# Patient Record
Sex: Female | Born: 1935
Health system: Southern US, Community
[De-identification: ages and names within clinical notes are randomized; demographics above are authoritative.]

## PROBLEM LIST (undated history)

## (undated) DIAGNOSIS — K648 Other hemorrhoids: Secondary | ICD-10-CM

## (undated) DIAGNOSIS — K5730 Diverticulosis of large intestine without perforation or abscess without bleeding: Secondary | ICD-10-CM

## (undated) DIAGNOSIS — M810 Age-related osteoporosis without current pathological fracture: Secondary | ICD-10-CM

## (undated) DIAGNOSIS — G4730 Sleep apnea, unspecified: Secondary | ICD-10-CM

## (undated) DIAGNOSIS — I341 Nonrheumatic mitral (valve) prolapse: Secondary | ICD-10-CM

## (undated) DIAGNOSIS — N904 Leukoplakia of vulva: Secondary | ICD-10-CM

## (undated) DIAGNOSIS — G8929 Other chronic pain: Secondary | ICD-10-CM

## (undated) DIAGNOSIS — I10 Essential (primary) hypertension: Secondary | ICD-10-CM

## (undated) DIAGNOSIS — M1990 Unspecified osteoarthritis, unspecified site: Secondary | ICD-10-CM

## (undated) DIAGNOSIS — H269 Unspecified cataract: Secondary | ICD-10-CM

## (undated) DIAGNOSIS — R531 Weakness: Secondary | ICD-10-CM

## (undated) DIAGNOSIS — K219 Gastro-esophageal reflux disease without esophagitis: Secondary | ICD-10-CM

## (undated) DIAGNOSIS — D128 Benign neoplasm of rectum: Secondary | ICD-10-CM

## (undated) DIAGNOSIS — G459 Transient cerebral ischemic attack, unspecified: Secondary | ICD-10-CM

## (undated) DIAGNOSIS — E785 Hyperlipidemia, unspecified: Secondary | ICD-10-CM

## (undated) DIAGNOSIS — M21372 Foot drop, left foot: Secondary | ICD-10-CM

## (undated) DIAGNOSIS — E039 Hypothyroidism, unspecified: Secondary | ICD-10-CM

## (undated) DIAGNOSIS — I4891 Unspecified atrial fibrillation: Secondary | ICD-10-CM

## (undated) DIAGNOSIS — K559 Vascular disorder of intestine, unspecified: Secondary | ICD-10-CM

## (undated) DIAGNOSIS — M549 Dorsalgia, unspecified: Secondary | ICD-10-CM

## (undated) DIAGNOSIS — R748 Abnormal levels of other serum enzymes: Secondary | ICD-10-CM

## (undated) DIAGNOSIS — F329 Major depressive disorder, single episode, unspecified: Secondary | ICD-10-CM

## (undated) DIAGNOSIS — F32A Depression, unspecified: Secondary | ICD-10-CM

## (undated) DIAGNOSIS — K449 Diaphragmatic hernia without obstruction or gangrene: Secondary | ICD-10-CM

## (undated) DIAGNOSIS — K3184 Gastroparesis: Secondary | ICD-10-CM

## (undated) DIAGNOSIS — R269 Unspecified abnormalities of gait and mobility: Secondary | ICD-10-CM

## (undated) HISTORY — DX: Unspecified cataract: H26.9

## (undated) HISTORY — DX: Diverticulosis of large intestine without perforation or abscess without bleeding: K57.30

## (undated) HISTORY — DX: Essential (primary) hypertension: I10

## (undated) HISTORY — DX: Weakness: R53.1

## (undated) HISTORY — DX: Dorsalgia, unspecified: M54.9

## (undated) HISTORY — DX: Benign neoplasm of rectum: D12.8

## (undated) HISTORY — PX: CATARACT EXTRACTION: SUR2

## (undated) HISTORY — DX: Foot drop, left foot: M21.372

## (undated) HISTORY — DX: Leukoplakia of vulva: N90.4

## (undated) HISTORY — DX: Transient cerebral ischemic attack, unspecified: G45.9

## (undated) HISTORY — DX: Age-related osteoporosis without current pathological fracture: M81.0

## (undated) HISTORY — DX: Other hemorrhoids: K64.8

## (undated) HISTORY — DX: Depression, unspecified: F32.A

## (undated) HISTORY — PX: COLONOSCOPY: SHX174

## (undated) HISTORY — PX: TUBAL LIGATION: SHX77

## (undated) HISTORY — DX: Unspecified abnormalities of gait and mobility: R26.9

## (undated) HISTORY — DX: Vascular disorder of intestine, unspecified: K55.9

## (undated) HISTORY — DX: Major depressive disorder, single episode, unspecified: F32.9

## (undated) HISTORY — DX: Sleep apnea, unspecified: G47.30

## (undated) HISTORY — DX: Other chronic pain: G89.29

## (undated) HISTORY — PX: CHOLECYSTECTOMY: SHX55

## (undated) HISTORY — DX: Diaphragmatic hernia without obstruction or gangrene: K44.9

## (undated) HISTORY — DX: Unspecified osteoarthritis, unspecified site: M19.90

---
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
## 1999-03-06 ENCOUNTER — Ambulatory Visit (HOSPITAL_COMMUNITY): Admission: RE | Admit: 1999-03-06 | Discharge: 1999-03-06 | Payer: Self-pay | Admitting: Gastroenterology

## 1999-09-27 DIAGNOSIS — K449 Diaphragmatic hernia without obstruction or gangrene: Secondary | ICD-10-CM

## 1999-09-27 HISTORY — DX: Diaphragmatic hernia without obstruction or gangrene: K44.9

## 2001-01-18 ENCOUNTER — Other Ambulatory Visit: Admission: RE | Admit: 2001-01-18 | Discharge: 2001-01-18 | Payer: Self-pay | Admitting: Urology

## 2002-05-29 ENCOUNTER — Encounter: Payer: Self-pay | Admitting: Emergency Medicine

## 2002-05-29 ENCOUNTER — Emergency Department (HOSPITAL_COMMUNITY): Admission: EM | Admit: 2002-05-29 | Discharge: 2002-05-30 | Payer: Self-pay | Admitting: Emergency Medicine

## 2002-06-25 ENCOUNTER — Ambulatory Visit (HOSPITAL_COMMUNITY): Admission: RE | Admit: 2002-06-25 | Discharge: 2002-06-25 | Payer: Self-pay | Admitting: Cardiology

## 2002-06-25 ENCOUNTER — Encounter: Payer: Self-pay | Admitting: Cardiology

## 2003-01-03 ENCOUNTER — Encounter: Payer: Self-pay | Admitting: Emergency Medicine

## 2003-01-03 ENCOUNTER — Emergency Department (HOSPITAL_COMMUNITY): Admission: EM | Admit: 2003-01-03 | Discharge: 2003-01-03 | Payer: Self-pay | Admitting: Emergency Medicine

## 2003-10-07 ENCOUNTER — Emergency Department (HOSPITAL_COMMUNITY): Admission: EM | Admit: 2003-10-07 | Discharge: 2003-10-07 | Payer: Self-pay | Admitting: Emergency Medicine

## 2004-01-19 ENCOUNTER — Emergency Department (HOSPITAL_COMMUNITY): Admission: EM | Admit: 2004-01-19 | Discharge: 2004-01-19 | Payer: Self-pay | Admitting: Emergency Medicine

## 2004-03-17 ENCOUNTER — Inpatient Hospital Stay (HOSPITAL_COMMUNITY): Admission: EM | Admit: 2004-03-17 | Discharge: 2004-03-19 | Payer: Self-pay | Admitting: Emergency Medicine

## 2004-04-28 ENCOUNTER — Ambulatory Visit (HOSPITAL_COMMUNITY): Admission: RE | Admit: 2004-04-28 | Discharge: 2004-04-28 | Payer: Self-pay | Admitting: Gastroenterology

## 2004-05-05 ENCOUNTER — Emergency Department (HOSPITAL_COMMUNITY): Admission: EM | Admit: 2004-05-05 | Discharge: 2004-05-05 | Payer: Self-pay | Admitting: Emergency Medicine

## 2004-05-06 ENCOUNTER — Inpatient Hospital Stay (HOSPITAL_COMMUNITY): Admission: EM | Admit: 2004-05-06 | Discharge: 2004-05-09 | Payer: Self-pay | Admitting: Emergency Medicine

## 2004-08-13 ENCOUNTER — Emergency Department (HOSPITAL_COMMUNITY): Admission: EM | Admit: 2004-08-13 | Discharge: 2004-08-14 | Payer: Self-pay | Admitting: Emergency Medicine

## 2005-02-03 ENCOUNTER — Ambulatory Visit: Payer: Self-pay | Admitting: Gastroenterology

## 2005-02-08 ENCOUNTER — Ambulatory Visit (HOSPITAL_COMMUNITY): Admission: RE | Admit: 2005-02-08 | Discharge: 2005-02-08 | Payer: Self-pay | Admitting: Gastroenterology

## 2005-08-12 ENCOUNTER — Emergency Department (HOSPITAL_COMMUNITY): Admission: EM | Admit: 2005-08-12 | Discharge: 2005-08-12 | Payer: Self-pay | Admitting: *Deleted

## 2005-08-18 ENCOUNTER — Ambulatory Visit (HOSPITAL_COMMUNITY): Admission: RE | Admit: 2005-08-18 | Discharge: 2005-08-18 | Payer: Self-pay | Admitting: Family Medicine

## 2005-09-07 ENCOUNTER — Ambulatory Visit (HOSPITAL_COMMUNITY): Admission: RE | Admit: 2005-09-07 | Discharge: 2005-09-07 | Payer: Self-pay | Admitting: Family Medicine

## 2005-11-29 ENCOUNTER — Ambulatory Visit: Payer: Self-pay | Admitting: Gastroenterology

## 2006-11-16 ENCOUNTER — Ambulatory Visit: Payer: Self-pay | Admitting: Gastroenterology

## 2007-03-06 ENCOUNTER — Ambulatory Visit: Payer: Self-pay | Admitting: Gastroenterology

## 2007-03-16 ENCOUNTER — Ambulatory Visit: Payer: Self-pay | Admitting: Gastroenterology

## 2007-03-16 ENCOUNTER — Encounter (INDEPENDENT_AMBULATORY_CARE_PROVIDER_SITE_OTHER): Payer: Self-pay | Admitting: *Deleted

## 2008-08-10 ENCOUNTER — Encounter: Admission: RE | Admit: 2008-08-10 | Discharge: 2008-08-10 | Payer: Self-pay | Admitting: Neurosurgery

## 2009-01-10 ENCOUNTER — Encounter (HOSPITAL_COMMUNITY): Admission: RE | Admit: 2009-01-10 | Discharge: 2009-01-10 | Payer: Self-pay | Admitting: Pediatrics

## 2009-01-23 ENCOUNTER — Telehealth: Payer: Self-pay | Admitting: Internal Medicine

## 2009-02-07 ENCOUNTER — Telehealth: Payer: Self-pay | Admitting: Internal Medicine

## 2009-02-10 ENCOUNTER — Encounter (INDEPENDENT_AMBULATORY_CARE_PROVIDER_SITE_OTHER): Payer: Self-pay | Admitting: *Deleted

## 2009-09-10 ENCOUNTER — Emergency Department (HOSPITAL_COMMUNITY): Admission: EM | Admit: 2009-09-10 | Discharge: 2009-09-10 | Payer: Self-pay | Admitting: Emergency Medicine

## 2010-01-28 ENCOUNTER — Encounter: Admission: RE | Admit: 2010-01-28 | Discharge: 2010-01-28 | Payer: Self-pay | Admitting: Neurosurgery

## 2010-05-06 ENCOUNTER — Telehealth (INDEPENDENT_AMBULATORY_CARE_PROVIDER_SITE_OTHER): Payer: Self-pay | Admitting: *Deleted

## 2010-06-18 ENCOUNTER — Encounter: Payer: Self-pay | Admitting: Cardiology

## 2011-01-17 ENCOUNTER — Encounter: Payer: Self-pay | Admitting: Gastroenterology

## 2011-01-26 NOTE — Progress Notes (Signed)
Summary: Schedule recall colonoscopy    Phone Note Outgoing Call Call back at [facility] Phone [phone]   Call placed by: Christie Nottingham CMA Duncan Dull),  May 06, 2010 2:58 PM Call placed to: Patient Summary of Call: Called pt to schedule recall colonoscopy and pt states she will have to discuss transportation with her daughter and she will call us back to schedule.  Initial call taken by: Christie Nottingham CMA (AAMA),  May 06, 2010 3:00 PM

## 2011-01-29 NOTE — Miscellaneous (Signed)
Summary: Advanced Written Confirmation of Verbal Orders  Advanced Written Confirmation of Verbal Orders   Imported By: Roderic Ovens 07/01/2010 14:21:42  _____________________________________________________________________  External Attachment:    Type:   Image     Comment:   External Document

## 2011-02-01 ENCOUNTER — Encounter (INDEPENDENT_AMBULATORY_CARE_PROVIDER_SITE_OTHER): Payer: Self-pay | Admitting: *Deleted

## 2011-02-11 NOTE — Letter (Signed)
Summary: Pre Visit Letter Revised  [facility]  [address] [place], [place] [address]   Phone: [phone]  Fax: [phone]        02/01/2011 MRN: [id number] Erica [name] [address] [name], [place]  [address]             Procedure Date:  03/02/2011 @ 11:30   Recall colon-Dr. Leone Payor   Welcome to the Gastroenterology Division at [facility].    You are scheduled to see a nurse for your pre-procedure visit on 02/17/2011 at 10:00 on the 3rd floor at [facility], [address]  We ask that you try to arrive at our office 15 minutes prior to your appointment time to allow for check-in.  Please take a minute to review the attached form.  If you answer "Yes" to one or more of the questions on the first page, we ask that you call the person listed at your earliest opportunity.  If you answer "No" to all of the questions, please complete the rest of the form and bring it to your appointment.    Your nurse visit will consist of discussing your medical and surgical history, your immediate family medical history, and your medications.   If you are unable to list all of your medications on the form, please bring the medication bottles to your appointment and we will list them.  We will need to be aware of both prescribed and over the counter drugs.  We will need to know exact dosage information as well.    Please be prepared to read and sign documents such as consent forms, a financial agreement, and acknowledgement forms.  If necessary, and with your consent, a friend or relative is welcome to sit-in on the nurse visit with you.  Please bring your insurance card so that we may make a copy of it.  If your insurance requires a referral to see a specialist, please bring your referral form from your primary care physician.  No co-pay is required for this nurse visit.     If you cannot keep your appointment, please call [phone] to cancel or reschedule prior to your  appointment date.  This allows [place] the opportunity to schedule an appointment for another patient in need of care.    Thank you for choosing [facility] for your medical needs.  We appreciate the opportunity to care for you.  Please visit [place] at our website  to learn more about our practice.  Sincerely, The Gastroenterology Division

## 2011-02-16 ENCOUNTER — Encounter (INDEPENDENT_AMBULATORY_CARE_PROVIDER_SITE_OTHER): Payer: Self-pay | Admitting: *Deleted

## 2011-02-17 ENCOUNTER — Encounter: Payer: Self-pay | Admitting: Internal Medicine

## 2011-02-23 NOTE — Miscellaneous (Signed)
Summary: [facility] previsit  Clinical Lists Changes  Medications: Added new medication of MOVIPREP 100 GM  SOLR (PEG-KCL-NACL-NASULF-NA ASC-C) As per prep instructions. - Signed Rx of MOVIPREP 100 GM  SOLR (PEG-KCL-NACL-NASULF-NA ASC-C) As per prep instructions.;  #1 x 0;  Signed;  Entered by: Karl Bales RN;  Authorized by: Iva Boop MD, FACG;  Method used: Print then Give to Patient Allergies: Added new allergy or adverse reaction of IODINE Observations: Added new observation of NKA: F (02/17/2011 11:19)    Prescriptions: MOVIPREP 100 GM  SOLR (PEG-KCL-NACL-NASULF-NA ASC-C) As per prep instructions.  #1 x 0   Entered by:   Karl Bales RN   Authorized by:   Iva Boop MD, [facility]   Signed by:   Karl Bales RN on 02/17/2011   Method used:   Print then Give to Patient   RxID:   [id number]

## 2011-02-23 NOTE — Letter (Signed)
Summary: [facility] Instructions  [facility]  [address] [place], [place] [address]   Phone: [phone]  Fax: [phone]       ELLEE WAWRZYNIAK    [birth year]    MRN: [id number]        Procedure Day Dorna Bloom:  Jake Shark  03/02/11     Arrival Time:  10:30AM     Procedure Time:  11:30AM     Location of Procedure:                    _X _  [facility] (4th Floor)                      PREPARATION FOR COLONOSCOPY WITH MOVIPREP   Starting 5 days prior to your procedure 02/25/11 do not eat nuts, seeds, popcorn, corn, beans, peas,  salads, or any raw vegetables.  Do not take any fiber supplements (e.g. Metamucil, Citrucel, and Benefiber).  THE DAY BEFORE YOUR PROCEDURE         DATE: 03/01/11   DAY: MONDAY  1.  Drink clear liquids the entire day-NO SOLID FOOD  2.  Do not drink anything colored red or purple.  Avoid juices with pulp.  No orange juice.  3.  Drink at least 64 oz. (8 glasses) of fluid/clear liquids during the day to prevent dehydration and help the prep work efficiently.  CLEAR LIQUIDS INCLUDE: Water Jello Ice Popsicles Tea (sugar ok, no milk/cream) Powdered fruit flavored drinks Coffee (sugar ok, no milk/cream) Gatorade Juice: apple, white grape, white cranberry  Lemonade Clear bullion, consomm, broth Carbonated beverages (any kind) Strained chicken noodle soup Hard Candy                             4.  In the morning, mix first dose of MoviPrep solution:    Empty 1 Pouch A and 1 Pouch B into the disposable container    Add lukewarm drinking water to the top line of the container. Mix to dissolve    Refrigerate (mixed solution should be used within 24 hrs)  5.  Begin drinking the prep at 5:00 p.m. The MoviPrep container is divided by 4 marks.   Every 15 minutes drink the solution down to the next mark (approximately 8 oz) until the full liter is complete.   6.  Follow completed prep with 16 oz of clear liquid of your choice (Nothing  red or purple).  Continue to drink clear liquids until bedtime.  7.  Before going to bed, mix second dose of MoviPrep solution:    Empty 1 Pouch A and 1 Pouch B into the disposable container    Add lukewarm drinking water to the top line of the container. Mix to dissolve    Refrigerate  THE DAY OF YOUR PROCEDURE      DATE: 03/02/11  DAY: TUESDAY  Beginning at 6:30AM (5 hours before procedure):         1. Every 15 minutes, drink the solution down to the next mark (approx 8 oz) until the full liter is complete.  2. Follow completed prep with 16 oz. of clear liquid of your choice.    3. You may drink clear liquids until 9:30AM (2 HOURS BEFORE PROCEDURE).   MEDICATION INSTRUCTIONS  Unless otherwise instructed, you should take regular prescription medications with a small sip of water   as early as possible the morning of  your procedure.   Additional medication instructions: HOLD FUROSEMIDE the day of your procedure only         OTHER INSTRUCTIONS  You will need a responsible adult at least 75 years of age to accompany you and drive you home.   This person must remain in the waiting room during your procedure.  Wear loose fitting clothing that is easily removed.  Leave jewelry and other valuables at home.  However, you may wish to bring a book to read or  an iPod/MP3 player to listen to music as you wait for your procedure to start.  Remove all body piercing jewelry and leave at home.  Total time from sign-in until discharge is approximately 2-3 hours.  You should go home directly after your procedure and rest.  You can resume normal activities the  day after your procedure.  The day of your procedure you should not:   Drive   Make legal decisions   Operate machinery   Drink alcohol   Return to work  You will receive specific instructions about eating, activities and medications before you leave.    The above instructions have been reviewed and explained to  me by   Karl Bales RN  February 17, 2011 11:52 AM    I fully understand and can verbalize these instructions _____________________________ Date _________

## 2011-03-02 ENCOUNTER — Other Ambulatory Visit: Payer: Self-pay | Admitting: Internal Medicine

## 2011-03-02 ENCOUNTER — Other Ambulatory Visit (AMBULATORY_SURGERY_CENTER): Payer: Medicare Other | Admitting: Internal Medicine

## 2011-03-02 DIAGNOSIS — Z86010 Personal history of colonic polyps: Secondary | ICD-10-CM

## 2011-03-02 DIAGNOSIS — K5730 Diverticulosis of large intestine without perforation or abscess without bleeding: Secondary | ICD-10-CM

## 2011-03-02 DIAGNOSIS — Z1211 Encounter for screening for malignant neoplasm of colon: Secondary | ICD-10-CM

## 2011-03-02 DIAGNOSIS — K620 Anal polyp: Secondary | ICD-10-CM

## 2011-03-02 DIAGNOSIS — D126 Benign neoplasm of colon, unspecified: Secondary | ICD-10-CM

## 2011-03-05 ENCOUNTER — Telehealth: Payer: Self-pay | Admitting: Internal Medicine

## 2011-03-09 ENCOUNTER — Encounter: Payer: Self-pay | Admitting: Internal Medicine

## 2011-03-09 NOTE — Procedures (Addendum)
Summary: Colonoscopy  Patient: Erica [name] Note: All result statuses are Final unless otherwise noted.  Tests: (1) Colonoscopy (COL)   COL Colonoscopy           DONE     [facility]     [address]     [place], [place]  [address]          COLONOSCOPY PROCEDURE REPORT          PATIENT:  [name], [name]  MR#:  [id number]     BIRTHDATE:  1930/01/20, 80 yrs. old  GENDER:  female     ENDOSCOPIST:  Iva Boop, MD, [facility]     REF. BY:     PROCEDURE DATE:  03/02/2011     PROCEDURE:  Colonoscopy with snare polypectomy     ASA CLASS:  Class II     INDICATIONS:  surveillance and high-risk screening, history of     pre-cancerous (adenomatous) colon polyps multiple polyps over the     years, last in 2008 12 mm adenoma from rectosigmoid area. has had     a recurrent villous rectal polyp in past, also     MEDICATIONS:   Fentanyl 50 mcg IV, Versed 7 mg IV          DESCRIPTION OF PROCEDURE:   After the risks benefits and     alternatives of the procedure were thoroughly explained, informed     consent was obtained.  Digital rectal exam was performed and     revealed no abnormalities.   The LB 180AL [id number] endoscope was     introduced through the anus and advanced to the cecum, which was     identified by both the appendix and ileocecal valve, without     limitations.  The quality of the prep was excellent, using     MoviPrep.  The instrument was then slowly withdrawn as the colon     was fully examined. Insertion: 5:02 minutes withdrawal: 13:26     minutes     <<PROCEDUREIMAGES>>          FINDINGS:  A sessile polyp was found in the rectum. 15 mm in     proximal rectum. Polyp was snared piecemeal, then cauterized with     monopolar cautery. Retrieval was successful.   Severe     diverticulosis was found in the sigmoid colon.  This was otherwise     a normal examination of the colon.   Retroflexed views in the     rectum revealed internal hemorrhoids.    The scope was  then     withdrawn from the patient and the procedure completed.          COMPLICATIONS:  None     ENDOSCOPIC IMPRESSION:     1) Sessile polyp in the rectum - removed. ? recurrence of prior     polyp     2) Severe diverticulosis in the sigmoid colon     3) Internal hemorrhoids     4) Otherwise normal examination, excellent prep     5) Personal history of adenomatous polyps including recurrent     rectal polyp in past.     RECOMMENDATIONS:     1) Hold aspirin, aspirin products, and anti-inflammatory     medication for 2 weeks.     REPEAT EXAM:  In for Colonoscopy or Sigmoidoscopy, pending biopsy     results.          Iva Boop, MD,  [facility]          CC:  Bennie Pierini, NP     The Patient          n.     eSIGNED:   Iva Boop at 03/02/2011 12:57 PM          Lavera Guise, [id number]  Note: An exclamation mark (!) indicates a result that was not dispersed into the flowsheet. Document Creation Date: 03/02/2011 12:57 PM _______________________________________________________________________  (1) Order result status: Final Collection or observation date-time: 03/02/2011 12:46 Requested date-time:  Receipt date-time:  Reported date-time:  Referring Physician:   Ordering Physician: Stan Head [phone]) Specimen Source:  Source: Launa Grill Order Number: [id number] Lab site:   Appended Document: Colonoscopy     Procedures Next Due Date:    Colonoscopy: 08/2011

## 2011-03-16 NOTE — Letter (Signed)
Summary: Patient Notice- Polyp Results  [facility]  [address] [place], [place] [address]   Phone: [phone]  Fax: [phone]        March 09, 2011 MRN: [id number]    Erica [name] [address] [place], [place]  [address]    Dear Ms. Flegel,  The polyp removed from your rectum was adenomatous. This means that it was pre-cancerous or that it had the potential to change into cancer over time.  In order to make sure that the polyp was completely removed, I recommend you have a repeat examination of the area with sigmoidoscopy in 6 months (around July or August 2012). If you are not contacted by [place] at that time, please call us.  Should you develop new or worsening symptoms of abdominal pain, bowel habit changes or bleeding from the rectum or bowels, please schedule an evaluation with either your primary care physician or with me.  Please call us if you are having persistent problems or have questions about your condition that have not been fully answered at this time.  Sincerely,  Iva Boop MD, [facility]  This letter has been electronically signed by your physician.  Appended Document: Patient Notice- Polyp Results letter mailed

## 2011-03-16 NOTE — Progress Notes (Signed)
Summary: Triage   Phone Note Call from Patient Call back at Home Phone [phone]   Caller: Patient Call For: Dr. Leone Payor Reason for Call: Talk to Nurse Summary of Call: Constipation since the COL...wants to discuss Initial call taken by: Karna Christmas,  March 05, 2011 9:41 AM  Follow-up for Phone Call        Patient hasn't had a BM since her colon on 03/02/11.  She also saw a very small amount of blood when she wiped. Patient advised that she can try MOM, Miralax or a stool softener.  She is advised to call back for an increase in bleeding or problems.  She denies abdominal pain, nausea or vomiting.   Follow-up by: Darcey Nora RN, CGRN,  March 05, 2011 10:15 AM

## 2011-04-12 LAB — HEPATIC FUNCTION PANEL
ALT: 21 U/L (ref 0–35)
Albumin: 3.3 g/dL — ABNORMAL LOW (ref 3.5–5.2)
Alkaline Phosphatase: 46 U/L (ref 39–117)
Indirect Bilirubin: 0.5 mg/dL (ref 0.3–0.9)
Total Bilirubin: 0.6 mg/dL (ref 0.3–1.2)
Total Protein: 6.3 g/dL (ref 6.0–8.3)

## 2011-04-12 LAB — TSH: TSH: 1.116 u[IU]/mL (ref 0.350–4.500)

## 2011-04-12 LAB — LIPID PANEL: Total CHOL/HDL Ratio: 4.8 RATIO

## 2011-04-12 LAB — BASIC METABOLIC PANEL
BUN: 19 mg/dL (ref 6–23)
Calcium: 9.1 mg/dL (ref 8.4–10.5)

## 2011-05-14 NOTE — Assessment & Plan Note (Signed)
[facility]                           GASTROENTEROLOGY OFFICE NOTE   NAME:Erica [name], Erica [name]                      MRN:          [id number]  DATE:11/16/2006                            DOB:          [birth year]    Actually comes in with her daughter for her yearly followup.  She says she  does have some breakthrough burning and reflux.  Takes AcipHex once a day.  She does have some gastroparesis that we have tried to treat in the past.  She says she stays constipated, gets bloated easily.  She says she is eating  a lot of fiber.  She intermittently has been getting some epigastric pain as  well.   PHYSICAL EXAMINATION:  She looks about the same, weighs 160, blood pressure  118/70, pulse 64 and regular.  Lungs, heart and extremities are  unremarkable.   IMPRESSION:  1. Gastroesophageal reflux disease with persistent reflux.  2. Known alcohol gastritis.  3. Hypothyroidism.  4. Gastroparesis.  5. Constipation related to the gastroparesis.  6. Arteriosclerotic cardiovascular disease.  7. History of large villous adenoma in the rectum which has been removed.  8. History of rectal bleeding.  9. Lots of severe anxiety/depression in the past.   RECOMMENDATIONS:  Take the Carafate slurry, not tablets which she has been  taking between meals and at bedtime.  Use a half a Reglan at dinner and a  whole one at bedtime.  Hopefully she can tolerate this.  Use MiraLax and  Senokot - I wrote this out for her - and try AcipHex b.i.d.  I gave her  samples and a prescription for this as well as the Carafate suspension and  the Reglan.  I told her to let us know if she is not  any better.     Ulyess Mort, MD  Electronically Signed    SML/MedQ  DD: 11/16/2006  DT: 11/16/2006  Job #: [id number]

## 2011-05-14 NOTE — Discharge Summary (Signed)
Erica [name], Erica [name]                         ACCOUNT NO.:  [id number]   MEDICAL RECORD NO.:  [id number]                   PATIENT TYPE:  INP   LOCATION:  0484                                 [facility]ACILITY:  [facility]   PHYSICIAN:  Deirdre Peer. Polite, M.D.              DATE OF BIRTH:  November 26, 1930   DATE OF ADMISSION:  05/06/2004  DATE OF DISCHARGE:                                 DISCHARGE SUMMARY   DISCHARGE DIAGNOSES:  1. Recurrent nausea and vomiting secondary to gastroparesis, resolved at     discharge.  Clostridium difficile negative.  No sign of other acute     infection.  2. History of mitral valve prolapse.  3. History of atrial fibrillation.  4. History of hypertension.  5. History of transient ischemic attack.  6. Hypothyroidism.  7. Gastritis.   DISCHARGE MEDICATIONS:  The patient is asked to resume home medications  which include:  1. Plavix 75 mg daily.  2. Toprol-XL 50 mg one-half daily.  3. Synthroid 50 mcg daily.  4. Aciphex 20 mg daily.  5. The patient is also asked to resume Reglan 10 mg q.i.d.   DISPOSITION:  Discharged to home in stable condition, asked to follow up  with Dr. Victorino Dike of GI on May 20, 2004 at 11:30 a.m.   STUDIES:  The patient had BMET which was significant for hypokalemia.  At  discharge, potassium within normal limits at 3.9.  TSH 4.6.  Creatinine  within normal limits.  C. difficile negative.  Stool O&P negative.  Stool  wbc negative.  Hemoccult negative.   HISTORY OF PRESENT ILLNESS:  A 77 year old female with the above medical  problems who has undergone extensive evaluation in the past for nausea that  was ultimately determined to be gastroparesis and typically follows with Dr.  Victorino Dike.  The patient presented to the ED with recurrent symptoms.  Admission was deemed necessary for further evaluation and treatment.  Please  see initial H&P for further details of history of present illness.   HOSPITAL COURSE:  The patient was admitted  to a medicine floor bed for  evaluation and treatment of nausea, vomiting, gastroparesis with resultant  dehydration.  The patient was given judicious IV fluids, antiemetics.  Cultures were obtained to rule out an infectious etiology and as stated  above these cultures were negative.  The patient did report that she thought  she saw some blood per rectum.  Rectal exam was performed by me with no  stool in vault and hemoccult was negative.  Because of that complaint the  patient was seen by her gastroenterologist.  The patient was seen and as she  has had recent endoscopies in the past and her stool was heme negative and  H&H was negative  it was not felt prudent to undergo further endoscopy at this time.  It was  felt to be acute gastroenteritis by GI.  The patient  is stable, keeping down  food, and at this time is deemed stable for discharge.  The patient has  other medical problems and will continue current outpatient medications.                                               Deirdre Peer. Polite, M.D.    RDP/MEDQ  D:  05/09/2004  T:  05/09/2004  Job:  [id number]

## 2011-05-14 NOTE — Discharge Summary (Signed)
NAMESHAROL, CROGHAN                         ACCOUNT NO.:  [id number]   MEDICAL RECORD NO.:  [id number]                   PATIENT TYPE:  INP   LOCATION:  0449                                 FACILITY:  [facility]   PHYSICIAN:  Corinna L. Lendell Caprice, MD             DATE OF BIRTH:  Jun 18, 1930   DATE OF ADMISSION:  03/17/2004  DATE OF DISCHARGE:  03/19/2004                                 DISCHARGE SUMMARY   DIAGNOSES:  1. Nausea and vomiting secondary to gastroparesis.  2. History of atrial fibrillation.  3. Mitral valve prolapse.  4. Hypothyroidism.  5. History of transient ischemic attack.  6. Hypertension.   DISCHARGE MEDICATIONS:  1. Reglan 10 mg p.o. q.a.c. and q.h.s.  2. Plavix 75 mg p.o. q.d.  3. Toprol XL 25 mg p.o. q.d.  4. Synthroid 50 mcg p.o. q.d.  5. Aciphex.   She is to follow up with her gastroenterologist as needed.   ACTIVITY:  Ad lib.   DIET:  As tolerated.   CONDITION ON DISCHARGE:  Stable.   CONSULTATIONS:  None.   PERTINENT LABORATORIES:  Complete metabolic panel normal.  Lipase normal.  Initial white blood cell count was 14.9, but upon repeat had normalized  without any treatment.  The rest of her CBC was unremarkable.  CPK-MB and  troponin negative.  TSH normal.  UA negative.   SPECIAL STUDIES/RADIOLOGY:  Acute abdominal series with chest negative.   EKG shows normal sinus rhythm with left bundle branch block.   Gastric emptying study showed only 32% of the tracer had exited the stomach  at two hours.  Only 13% was gone after one hour.   HISTORY/HOSPITAL COURSE:  Ms. Deupree is a 77 year old white female who has  had multiple admissions for recurrent nausea and vomiting.  She has been to  multiple different hospitals.  She had a colonoscopy and EGD done in  November of 2004 with [facility].  She reports that it showed redness in  her stomach.  Patient was started on IV fluids, clear-liquid diet, IV  Reglan, IV Protonix, and p.r.n. antiemetics.   Patient initially had some  slight epigastric tenderness but upon exam in the emergency room; however,  when I examined her the day after admission, her abdomen was completely  soft, nontender, nondistended.  She had a normal workup to that point.  The  Reglan seemed to help tremendously, and I ordered gastric emptying test,  which was very abnormal.  Essentially 2/3 of the tracer was still present in  her stomach after two hours; therefore, she is being sent home with a  prescription for Reglan.  At the time of discharge, she was tolerating a  solid diet, feeling much better, had no abdominal pain, and is being  discharged.  Corinna L. Lendell Caprice, MD    CLS/MEDQ  D:  03/19/2004  T:  03/20/2004  Job:  [id number]   cc:   [facility]

## 2011-05-14 NOTE — H&P (Signed)
NAMEANABELL, SWINT                         ACCOUNT NO.:  [id number]   MEDICAL RECORD NO.:  [id number]                   PATIENT TYPE:  INP   LOCATION:  0449                                 FACILITY:  [facility]   PHYSICIAN:  Jackie Plum, M.D.             DATE OF BIRTH:  September 18, 1930   DATE OF ADMISSION:  03/17/2004  DATE OF DISCHARGE:                                HISTORY & PHYSICAL   PRIMARY CARE PHYSICIAN:  Dr. Magnus Sinning. [facility].   CHIEF COMPLAINT:  Nausea, vomiting and abdominal pains.   HISTORY OF PRESENT ILLNESS:  The patient is a 77 year old Caucasian lady  with history of atrial fibrillation and hypothyroidism, who presented with 1-  day history of nausea, vomiting and upper abdominal pain.  On account of  this, she came to the ED.  She vomited several times today without much  relief.  She does not have any associated diarrhea or constipation.  She  denies any recent travel, sick contacts, recent antibiotic use or any bad  food exposure.  She denies any history of fever, chills, myalgias,  headaches, sore throat, blurred vision, cough, shortness of breath, chest  pain, dysuria or frequency of micturition.  She felt a little bit dizzy on  sitting up today.  On account of her persistent nausea and vomiting, the  patient came to the ED today for further evaluation.  At the ED, exam showed  a some epigastric tenderness.  She was given IV Phenergan x2 and Zofran  without significant relief and therefore, we are asked to evaluate for  admission.   Over the last 1-1/2 months, the patient relates that she has had about 4  episodes of similar events with nausea, vomiting and abdominal pain.  The  last episode was in January of this year, whereupon she was hospitalized at  [facility].  According to the patient's daughter who is at bedside,  during that admission abdominal series was done which was negative and  workup also indicated that she did not have any  pancreatitis.   PAST MEDICAL HISTORY:  Her past medical history is notable for:  1. History of hypertension.  2. Previous atrial fibrillation.  3. Mitral valve prolapse.  4. Hypothyroidism.  5. Transient ischemic attack.   MEDICATION HISTORY:   ALLERGIES:  She is allergic to IODINE.   MEDICATIONS:  She takes:  1. Plavix 75 mg p.o. daily.  2. Toprol 50 mg half a tablet daily.  3. Synthroid 50 mcg p.o. daily.  4. Aciphex.   FAMILY HISTORY:  Family history is negative for any bowel cancers or any  other bowel problems.   SOCIAL HISTORY:  The patient is married.  She is a housewife.  She has 9  older children who live around [place] in [place].  She does not  smoke cigarettes nor drink alcohol.   REVIEW OF SYSTEMS:  Significant positive and negatives  are as per HPI above,  otherwise, review of systems was unremarkable.   PHYSICAL EXAMINATION:  VITAL SIGNS:  BP was 158/74, pulse rate of 78 per  minute, respiratory rate of 18 per minute, O2 saturation of 96% on room air,  temperature of 97.5 degrees Fahrenheit.  GENERAL:  She was not in acute cardiopulmonary or painful distress during my  evaluation.  There was no evidence of persistent retching but the patient  indicated that she had just finished vomiting.  HEENT:  Normocephalic, atraumatic.  Pupils were equal, round, reactive to  light.  Extraocular movements were intact.  Oropharynx exam was notable for  mild oropharyngeal dryness, no exudation or erythema appreciated.  NECK:  Neck was supple with no JVD.  LUNGS:  Lungs were clear to auscultation bilaterally.  CARDIAC:  Exam was notable for regular rate and rhythm without any gallops  or murmur (__________ despite history of atrial fibrillation).  ABDOMEN:  Abdomen was soft.  She had some mild epigastric tenderness.  Bowel  sounds were present, they were normoactive.  There was no obvious guarding  or rebound.  EXTREMITIES:  On extremity exam, she had trace bipedal  pitting edema.  Dorsalis pedis pulses were present and were normoactive.  CNS:  She was alert and oriented x3, no acute focal deficits were  appreciated.   LABORATORY WORK:  WBC count was 14.9, hemoglobin 14.4, hematocrit 42.3, MCV  89.4, platelet count 273,000.  Sodium 140, potassium 4.4, chloride 108, CO2  30, glucose 122, BUN 18, creatinine 0.7, total bilirubin 0.4, alkaline  phosphatase 55, SGOT 28, SGPT 33, total protein 7.0, albumin 3.6, calcium  9.4.  Lipase level 21.   Acute abdominal series has been ordered by ED physician; results are pending  at time of my evaluation.   ASSESSMENT:  Intractable nausea and vomiting with epigastric pain  essentially ___________ gastritis.  The patient has not had any  gastrointestinal evaluation apparently. The patient had been admitted to the  hospital in January, per history, but I do not see any information regarding  this admission on the [facility] system computer, although she states that she was  admitted to [facility].   PLAN OF CARE:  Plan of care will be to admit the patient to a regular bed.  We will offer her symptomatic support with antiemetics and proton pump  inhibitor.  She will get a GI cocktail as needed.  We will give her IV fluid  supplementation.  We will take opportunity to check a TSH and 12-lead EKG  with 1 cardiac enzyme.  She will get orthostasis checks.  Should symptoms  persist, the patient may benefit from inpatient GI evaluation.  On the other  hand, should she do well, outpatient evaluation by GI may be performed.                                               Jackie Plum, M.D.    GO/MEDQ  D:  03/17/2004  T:  03/18/2004  Job:  [id number]   cc:   Magnus Sinning. Dimple Casey, M.D.  [address] [place]  [place] [address]  Fax: [phone]

## 2011-07-01 ENCOUNTER — Emergency Department (HOSPITAL_COMMUNITY)
Admission: EM | Admit: 2011-07-01 | Discharge: 2011-07-01 | Disposition: A | Discharge status: Home / Self Care | Payer: Medicare Other | Attending: Emergency Medicine | Admitting: Emergency Medicine

## 2011-07-01 ENCOUNTER — Emergency Department (HOSPITAL_COMMUNITY): Payer: Medicare Other

## 2011-07-01 DIAGNOSIS — E039 Hypothyroidism, unspecified: Secondary | ICD-10-CM | POA: Insufficient documentation

## 2011-07-01 DIAGNOSIS — Z79899 Other long term (current) drug therapy: Secondary | ICD-10-CM | POA: Insufficient documentation

## 2011-07-01 DIAGNOSIS — I4891 Unspecified atrial fibrillation: Secondary | ICD-10-CM | POA: Insufficient documentation

## 2011-07-01 DIAGNOSIS — R0602 Shortness of breath: Secondary | ICD-10-CM | POA: Insufficient documentation

## 2011-07-01 DIAGNOSIS — E876 Hypokalemia: Secondary | ICD-10-CM | POA: Insufficient documentation

## 2011-07-01 DIAGNOSIS — R112 Nausea with vomiting, unspecified: Secondary | ICD-10-CM | POA: Insufficient documentation

## 2011-07-01 DIAGNOSIS — E860 Dehydration: Secondary | ICD-10-CM | POA: Insufficient documentation

## 2011-07-01 LAB — URINALYSIS, ROUTINE W REFLEX MICROSCOPIC
Ketones, ur: NEGATIVE mg/dL
Leukocytes, UA: NEGATIVE
Nitrite: NEGATIVE
Protein, ur: NEGATIVE mg/dL
Urobilinogen, UA: 1 mg/dL (ref 0.0–1.0)

## 2011-07-01 LAB — DIFFERENTIAL
Basophils Absolute: 0.1 10*3/uL (ref 0.0–0.1)
Basophils Relative: 0 % (ref 0–1)
Eosinophils Absolute: 0.1 10*3/uL (ref 0.0–0.7)
Monocytes Absolute: 1.2 10*3/uL — ABNORMAL HIGH (ref 0.1–1.0)
Monocytes Relative: 10 % (ref 3–12)
Neutrophils Relative %: 58 % (ref 43–77)

## 2011-07-01 LAB — CBC
MCH: 30.2 pg (ref 26.0–34.0)
MCHC: 33.7 g/dL (ref 30.0–36.0)
Platelets: 247 10*3/uL (ref 150–400)
RBC: 4.53 MIL/uL (ref 3.87–5.11)

## 2011-07-01 LAB — BASIC METABOLIC PANEL
BUN: 15 mg/dL (ref 6–23)
Calcium: 9.1 mg/dL (ref 8.4–10.5)
Creatinine, Ser: 0.78 mg/dL (ref 0.50–1.10)
GFR calc Af Amer: >60 mL/min (ref >60)
GFR calc non Af Amer: >60 mL/min (ref >60)

## 2011-07-01 LAB — URINE MICROSCOPIC-ADD ON

## 2011-07-01 LAB — TROPONIN I: Troponin I: <0.3 ng/mL (ref <0.30)

## 2011-07-08 ENCOUNTER — Emergency Department (INDEPENDENT_AMBULATORY_CARE_PROVIDER_SITE_OTHER): Payer: Medicare Other

## 2011-07-08 ENCOUNTER — Emergency Department (HOSPITAL_BASED_OUTPATIENT_CLINIC_OR_DEPARTMENT_OTHER)
Admission: EM | Admit: 2011-07-08 | Discharge: 2011-07-08 | Disposition: A | Discharge status: Home / Self Care | Payer: Medicare Other | Attending: Emergency Medicine | Admitting: Emergency Medicine

## 2011-07-08 DIAGNOSIS — H9209 Otalgia, unspecified ear: Secondary | ICD-10-CM | POA: Insufficient documentation

## 2011-07-08 DIAGNOSIS — I679 Cerebrovascular disease, unspecified: Secondary | ICD-10-CM

## 2011-07-08 DIAGNOSIS — K219 Gastro-esophageal reflux disease without esophagitis: Secondary | ICD-10-CM | POA: Insufficient documentation

## 2011-07-08 DIAGNOSIS — E039 Hypothyroidism, unspecified: Secondary | ICD-10-CM | POA: Insufficient documentation

## 2011-07-08 DIAGNOSIS — R51 Headache: Secondary | ICD-10-CM | POA: Insufficient documentation

## 2011-07-08 DIAGNOSIS — R030 Elevated blood-pressure reading, without diagnosis of hypertension: Secondary | ICD-10-CM

## 2011-07-08 DIAGNOSIS — I4891 Unspecified atrial fibrillation: Secondary | ICD-10-CM | POA: Insufficient documentation

## 2011-07-08 DIAGNOSIS — G319 Degenerative disease of nervous system, unspecified: Secondary | ICD-10-CM

## 2011-07-08 DIAGNOSIS — E785 Hyperlipidemia, unspecified: Secondary | ICD-10-CM | POA: Insufficient documentation

## 2011-07-08 HISTORY — DX: Gastro-esophageal reflux disease without esophagitis: K21.9

## 2011-07-08 HISTORY — DX: Hyperlipidemia, unspecified: E78.5

## 2011-07-08 HISTORY — DX: Gastroparesis: K31.84

## 2011-07-08 HISTORY — DX: Nonrheumatic mitral (valve) prolapse: I34.1

## 2011-07-08 HISTORY — DX: Unspecified atrial fibrillation: I48.91

## 2011-07-08 HISTORY — DX: Hypothyroidism, unspecified: E03.9

## 2011-07-08 MED ORDER — IBUPROFEN 800 MG PO TABS: 800.0000 mg | ORAL_TABLET | Freq: Three times a day (TID) | ORAL | Status: AC

## 2011-07-08 NOTE — ED Provider Notes (Signed)
History     Chief Complaint  Patient presents with  . Otalgia  . Headache   HPI Comments: Patient is a 77 year old female who presents with headache. The headache has been intermittent, sharp and stabbing lasting less than 2 seconds. It comes on at night and wakes her from sleep. She does not have the pain at this time. Is not associated with photophobia fever stiff neck chills abdominal pain shortness of breath chest pain rashes or any other complaints of. She does notice that she had some discharge from her left ear wash he was sleeping one night but she doesn't have any pain or fevers at this time. She's had no medications for this pain and has had no visual changes weakness or numbness.  Patient is a 77 y.o. female presenting with ear pain and headaches. The history is provided by the patient and a relative.  Otalgia Associated symptoms include headaches.  Headache     Past Medical History  Diagnosis Date  . MVP (mitral valve prolapse)   . Atrial fibrillation   . Hyperlipidemia   . Hypothyroid   . GERD (gastroesophageal reflux disease)   . Gastroparesis     No past surgical history on file.  No family history on file.  History  Substance Use Topics  . Smoking status: Never Smoker   . Smokeless tobacco: Not on file  . Alcohol Use: No    OB History    Grav Para Term Preterm Abortions TAB SAB Ect Mult Living                  Review of Systems  HENT: Positive for ear pain.   Neurological: Positive for headaches.  All other systems reviewed and are negative.    Physical Exam  BP 158/90  Pulse 74  Temp(Src) 97.8 F (36.6 C) (Oral)  Resp 16  Ht 5\' 3"  (1.6 m)  Wt 158 lb (71.668 kg)  BMI 27.99 kg/m2  SpO2 100%  Physical Exam  Constitutional: She appears well-developed and well-nourished. No distress.  HENT:  Head: Normocephalic and atraumatic.  Right Ear: External ear normal.  Left Ear: External ear normal.  Nose: Nose normal.  Mouth/Throat: Oropharynx  is clear and moist. No oropharyngeal exudate.       Bilateral tympanic membranes normal in appearance without effusions or discharge in the external auditory canal  Eyes: Conjunctivae and EOM are normal. Pupils are equal, round, and reactive to light. Right eye exhibits no discharge. Left eye exhibits no discharge. No scleral icterus.  Neck: Normal range of motion. Neck supple. No tracheal deviation present.  Cardiovascular: Normal rate and intact distal pulses.   No murmur heard. Pulmonary/Chest: Effort normal and breath sounds normal.  Musculoskeletal: Normal range of motion. She exhibits no edema and no tenderness.  Lymphadenopathy:    She has no cervical adenopathy.  Neurological: She is alert. No cranial nerve deficit. Coordination normal.  Skin: Skin is warm and dry. No rash noted. She is not diaphoretic. No erythema.    ED Course  Procedures  MDM Overall patient is well-appearing without any focal neurologic deficits no headaches at this time a normal-appearing ear. Patient and family members are extremely concerned for possible aneurysm. We'll get CT scan to rule out same but have recommended outpatient MRI for further evaluation of this atypical type headache. She declines pain medications at this time    Patient's CT scan reviewed without any focal findings. I have recommended that the patient followup with her  primary care doctor for MRI as needed for ongoing pain. She is aware of the indications for immediate return to the emergency department.  Vida Roller, MD 07/08/11 [phone]

## 2011-07-08 NOTE — ED Notes (Signed)
Pt reports onset of left ear discomfort that started 2-3 days ago with dark red drainage noted on pillow. Discomfort is described as "stopped up".. She also reports intermittent pains in left side of head that started approximately 2 weeks ago.  Pain is described as sharp, stabbing pain.  Denies head injury or neuro symptoms.  She reports feeling "light-headed" that increases with positional changes.  She was seen in [facility] ED 1 week ago for n/v and by PMD Saturday and is currently being treated for a UTI.

## 2011-07-14 ENCOUNTER — Other Ambulatory Visit: Payer: Self-pay | Admitting: Otolaryngology

## 2011-07-14 DIAGNOSIS — H9202 Otalgia, left ear: Secondary | ICD-10-CM

## 2011-07-14 DIAGNOSIS — G44009 Cluster headache syndrome, unspecified, not intractable: Secondary | ICD-10-CM

## 2011-07-21 ENCOUNTER — Ambulatory Visit
Admission: RE | Admit: 2011-07-21 | Discharge: 2011-07-21 | Disposition: A | Discharge status: Home / Self Care | Payer: Medicare Other | Source: Ambulatory Visit | Attending: Otolaryngology | Admitting: Otolaryngology

## 2011-07-21 DIAGNOSIS — G44009 Cluster headache syndrome, unspecified, not intractable: Secondary | ICD-10-CM

## 2011-07-21 DIAGNOSIS — H9202 Otalgia, left ear: Secondary | ICD-10-CM

## 2011-07-21 MED ORDER — GADOBENATE DIMEGLUMINE 529 MG/ML IV SOLN
14.0000 mL | Freq: Once | INTRAVENOUS | Status: AC | PRN
  Administered 2011-07-21: 14 mL via INTRAVENOUS

## 2011-07-28 ENCOUNTER — Telehealth: Payer: Self-pay | Admitting: Internal Medicine

## 2011-07-28 NOTE — Telephone Encounter (Signed)
Phone is busy I will continue to try and reach the patient  

## 2011-07-29 NOTE — Telephone Encounter (Signed)
Patient has had 2 episodes of vomiting in the last month.  No vomiting in the last several days.  She is due for a colon in September 2012.  She would like to discuss having an EGD at the same time.  I have scheduled her for 08/20/11 at 2:15 to discuss

## 2011-08-05 ENCOUNTER — Telehealth: Payer: Self-pay | Admitting: Internal Medicine

## 2011-08-05 NOTE — Telephone Encounter (Signed)
Patient states she started having blood in stools this week and stomach cramps. States she has been to the ER and called 911 for vomiting last month. Wants to be seen earlier that 08/20/11.

## 2011-08-05 NOTE — Telephone Encounter (Signed)
Patient states she started having blood in stools this week and she is having stomach cramps. Last month, she states she had vomiting and had to be seen in ER. She has an appointment with Dr. Leone Payor on 08/20/11 to discuss and EGD and Colon but would like to be seen earlier since she is having Rectal bleeding. Scheduled patient with Willette Cluster, NP on 08/06/11 at 2:00 PM.

## 2011-08-06 ENCOUNTER — Ambulatory Visit (INDEPENDENT_AMBULATORY_CARE_PROVIDER_SITE_OTHER): Payer: Medicare Other | Admitting: Nurse Practitioner

## 2011-08-06 ENCOUNTER — Encounter: Payer: Self-pay | Admitting: Nurse Practitioner

## 2011-08-06 VITALS — BP 122/80 | HR 72 | Ht 61.0 in | Wt 161.0 lb

## 2011-08-06 DIAGNOSIS — K219 Gastro-esophageal reflux disease without esophagitis: Secondary | ICD-10-CM | POA: Insufficient documentation

## 2011-08-06 DIAGNOSIS — Z86010 Personal history of colonic polyps: Secondary | ICD-10-CM

## 2011-08-06 DIAGNOSIS — R112 Nausea with vomiting, unspecified: Secondary | ICD-10-CM

## 2011-08-06 DIAGNOSIS — K648 Other hemorrhoids: Secondary | ICD-10-CM

## 2011-08-06 MED ORDER — HYDROCORTISONE ACETATE 25 MG RE SUPP: RECTAL | Status: DC

## 2011-08-06 MED ORDER — METOCLOPRAMIDE HCL 5 MG PO TABS: ORAL_TABLET | ORAL | Status: DC

## 2011-08-06 MED ORDER — POLYETHYLENE GLYCOL 3350 17 GM/SCOOP PO POWD: ORAL | Status: DC

## 2011-08-06 MED ORDER — BISACODYL 5 MG PO TBEC: 5.0000 mg | DELAYED_RELEASE_TABLET | Freq: Every day | ORAL | Status: AC | PRN

## 2011-08-06 NOTE — Progress Notes (Signed)
Erica [name] [id number] [birth year]   HISTORY OR PRESENT ILLNESS :This is a 76 year old female followed Dr. Leone Payor for history of GERD and colon polyps. She had a surveillance colonoscopy in March of this year with findings of severe diverticulosis and a 15mm sessile polyp. Colon polyp pathology was consistent with a tubulovillous adenoma. She is due for a flexible sigmoidoscopy to rule out any polyp remnants. Patient went to the emergency department July 5 with nausea. She has intermittent episodes of nausea and vomiting.   Has been tried on Reglan but had to quit secondary to side effects.   Patient comes in today for a two-three day history  of abdominal cramping, bloody diarrhea. She is actually feeling better since making this appointment. Yesterday she had only one bowel movement with streaks of blood. Today had formed stool. No fevers. She is emotionally, husband of 60 years passed away a year ago.   Current Medications, Allergies, Past Medical History, Past Surgical History, Family History and Social History were reviewed in [employer] record.   PHYSICAL EXAMINATION : General: Well developed  female in no acute distress Head: Normocephalic and atraumatic Eyes:  sclerae anicteric,conjunctive pink. Ears: Normal auditory acuity Mouth: No deformity or lesions Neck: Supple, no masses.  Lungs: Clear throughout to auscultation Heart: Regular rate and rhythm; no murmurs heard Abdomen: Soft, nondistended, nontender. No masses or hepatomegaly noted. Normal bowel sounds Rectal: No external lesions. Internal hemorrhoids on anoscopy Musculoskeletal: Symmetrical with no gross deformities  Skin: No lesions on visible extremities Extremities: No edema or deformities noted Neurological: Alert oriented x 4, grossly nonfocal Cervical Nodes:  No significant cervical adenopathy Psychological:  Alert and cooperative. Emotional  ASSESSMENT AND PLAN :

## 2011-08-06 NOTE — Patient Instructions (Signed)
We scheduled the Flexible Sigmoidoscopy/EGD with Dr Leone Payor.  Directions and brochure provided. We sent prescriptions for the Flex Sig prep and the suppositories to your pharmacy, [facility].

## 2011-08-08 NOTE — Assessment & Plan Note (Signed)
Occasional episodes of nausea and vomiting necessitating emergency room visits. For further evaluation patient will be schedule for upper endoscopy. The benefits, risks, and potential complications of EGD with possible biopsies and/or dilation were discussed with the patient and she agrees to proceed.

## 2011-08-08 NOTE — Assessment & Plan Note (Signed)
Anusol HC suppositories nightly x 7days

## 2011-08-08 NOTE — Assessment & Plan Note (Signed)
Due for flexible sigmoidoscopy to follow up on 15mm tubulovillous adenomas removed from rectum.  The risks, benefits, and alternatives to flexible sigmoidoscopy with possible biopsy and possible polypectomy were discussed with the patient and she consents to proceed.

## 2011-08-09 NOTE — Progress Notes (Signed)
i agree with the plan outlined in this note 

## 2011-08-12 ENCOUNTER — Telehealth: Payer: Self-pay | Admitting: Internal Medicine

## 2011-08-20 ENCOUNTER — Ambulatory Visit: Payer: Medicare Other | Admitting: Internal Medicine

## 2011-08-23 ENCOUNTER — Ambulatory Visit (AMBULATORY_SURGERY_CENTER): Payer: Medicare Other | Admitting: Internal Medicine

## 2011-08-23 ENCOUNTER — Encounter: Payer: Self-pay | Admitting: Internal Medicine

## 2011-08-23 VITALS — BP 130/73 | HR 68 | Temp 98.0°F | Resp 14 | Ht 62.4 in | Wt 168.0 lb

## 2011-08-23 DIAGNOSIS — K3184 Gastroparesis: Secondary | ICD-10-CM

## 2011-08-23 DIAGNOSIS — Z86010 Personal history of colonic polyps: Secondary | ICD-10-CM

## 2011-08-23 DIAGNOSIS — D128 Benign neoplasm of rectum: Secondary | ICD-10-CM

## 2011-08-23 DIAGNOSIS — R112 Nausea with vomiting, unspecified: Secondary | ICD-10-CM

## 2011-08-23 DIAGNOSIS — K648 Other hemorrhoids: Secondary | ICD-10-CM

## 2011-08-23 DIAGNOSIS — D129 Benign neoplasm of anus and anal canal: Secondary | ICD-10-CM

## 2011-08-23 DIAGNOSIS — K219 Gastro-esophageal reflux disease without esophagitis: Secondary | ICD-10-CM

## 2011-08-23 DIAGNOSIS — D126 Benign neoplasm of colon, unspecified: Secondary | ICD-10-CM

## 2011-08-23 DIAGNOSIS — K5730 Diverticulosis of large intestine without perforation or abscess without bleeding: Secondary | ICD-10-CM

## 2011-08-23 HISTORY — PX: UPPER GASTROINTESTINAL ENDOSCOPY: SHX188

## 2011-08-23 HISTORY — PX: SIGMOIDOSCOPY: SUR1295

## 2011-08-23 MED ORDER — SODIUM CHLORIDE 0.9 % IV SOLN: 500.0000 mL | INTRAVENOUS | Status: DC

## 2011-08-23 NOTE — Patient Instructions (Addendum)
The esophagus, stomach and duodenum looked normal. I suspect you had problems with gastroparesis - a condition you have where the stomach does not always empty properly. The rectal polyp appears to be gone - I took some biopsies to be sure and will let you know with a letter. Iva Boop, MD, [facility]   Discharge instructions per blue and green sheets EGD is normal. Suspect she had a flare of gastroparesis  Diverticulosis handout given with high fiber diet handout as well Dr Leone Payor did biopsy the polypectomy site from previous colonoscopy,( scar in the rectum). We will receive this result and mail you a letter in 1-2 weeks with those results and Dr Marvell Fuller recommendations.

## 2011-08-24 ENCOUNTER — Telehealth: Payer: Self-pay | Admitting: *Deleted

## 2011-08-24 NOTE — Telephone Encounter (Signed)

## 2011-09-03 ENCOUNTER — Encounter: Payer: Self-pay | Admitting: Internal Medicine

## 2011-10-21 NOTE — Telephone Encounter (Signed)
Questions answered and procedure done.

## 2012-01-13 DIAGNOSIS — M5137 Other intervertebral disc degeneration, lumbosacral region: Secondary | ICD-10-CM | POA: Diagnosis not present

## 2012-01-13 DIAGNOSIS — M47817 Spondylosis without myelopathy or radiculopathy, lumbosacral region: Secondary | ICD-10-CM | POA: Diagnosis not present

## 2012-01-13 DIAGNOSIS — M48061 Spinal stenosis, lumbar region without neurogenic claudication: Secondary | ICD-10-CM | POA: Diagnosis not present

## 2012-01-26 ENCOUNTER — Other Ambulatory Visit: Payer: Self-pay | Admitting: Obstetrics and Gynecology

## 2012-01-26 DIAGNOSIS — L940 Localized scleroderma [morphea]: Secondary | ICD-10-CM | POA: Diagnosis not present

## 2012-02-03 DIAGNOSIS — M48061 Spinal stenosis, lumbar region without neurogenic claudication: Secondary | ICD-10-CM | POA: Diagnosis not present

## 2012-02-03 DIAGNOSIS — M47817 Spondylosis without myelopathy or radiculopathy, lumbosacral region: Secondary | ICD-10-CM | POA: Diagnosis not present

## 2012-02-03 DIAGNOSIS — M5137 Other intervertebral disc degeneration, lumbosacral region: Secondary | ICD-10-CM | POA: Diagnosis not present

## 2012-02-04 ENCOUNTER — Other Ambulatory Visit: Payer: Self-pay | Admitting: Cardiology

## 2012-02-14 ENCOUNTER — Telehealth: Payer: Self-pay | Admitting: Internal Medicine

## 2012-02-14 NOTE — Telephone Encounter (Signed)
Patient reports daily reflux.  She reports that she is having "my food come up in my mouth after I eat".  She is taking nexium daily, but at night.  I have asked her to take her nexium 30 minutes before her first meal of the day and take an additional one at bedtime until she sees Dr Leone Payor on Thursday.  Patient instructed to maintain an anti-reflux diet. Advised to avoid caffeine, mint, citrus foods/juices, tomatoes,  chocolate, NSAIDS/ASA products.  Instructed not to eat within 2 hours of exercise or bed, multiple small meals are better than 3 large meals.  Need to take PPI 30 minutes prior to 1st meal of the day and as bedtime.  She will come in and see Dr Leone Payor on 02/17/12 2:15

## 2012-02-17 ENCOUNTER — Encounter: Payer: Self-pay | Admitting: Internal Medicine

## 2012-02-17 ENCOUNTER — Ambulatory Visit (INDEPENDENT_AMBULATORY_CARE_PROVIDER_SITE_OTHER): Payer: Medicare Other | Admitting: Internal Medicine

## 2012-02-17 VITALS — BP 146/78 | HR 72 | Ht 62.0 in | Wt 161.0 lb

## 2012-02-17 DIAGNOSIS — K3184 Gastroparesis: Secondary | ICD-10-CM | POA: Diagnosis not present

## 2012-02-17 DIAGNOSIS — K219 Gastro-esophageal reflux disease without esophagitis: Secondary | ICD-10-CM

## 2012-02-17 DIAGNOSIS — K5900 Constipation, unspecified: Secondary | ICD-10-CM | POA: Diagnosis not present

## 2012-02-17 MED ORDER — PSYLLIUM 58.6 % PO POWD: ORAL | Status: DC

## 2012-02-17 NOTE — Patient Instructions (Addendum)
Start taking you Nexium 30 minutes before supper. GERD diet handout given to to to read and follow. Stop drinking coffee at night. Take Over-the-counter Metamucil work up to 1 tablespoon in a 6-8 ounce glass of water everyday if that doesn't help can increase up to twice daily and can take Milk of Magnesium as needed. Try to raise the head of your bed with either a wedge or blocks 4-6 inches.

## 2012-02-17 NOTE — Assessment & Plan Note (Signed)
This sounds like it is flaring. I'm going to have her take her PPI before supper. She was using it on an as-needed basis and only recently started taking every day. She will limit a nighttime coffee at least. Her diet instructions are given. She has the head of her bed raised some but is advised to try to raise it more to 4-6 inches. If this fails to do the trick, may be able to add some metoclopramide and low dose at bedtime. We have reviewed the side effects of possible part of dyskinesia and neuropathy. I would go with a low dose.  She was concerned that she might need another endoscopy, she just had one in August of 2012 looked normal.

## 2012-02-17 NOTE — Assessment & Plan Note (Signed)
This is probably playing some role in her GERD symptoms. Before starting a prokinetic agent will try the modifications as listed on her GERD assessment and plan.

## 2012-02-17 NOTE — Assessment & Plan Note (Signed)
She's on a stool softener, we'll have her take Metamucil, one or 2 tablespoons a day. I've avoided giving a high fiber diet necessarily because of the problem that might play with gastroparesis. As needed milk of magnesia can be used. If this does not work, regular MiraLax would be the next step I think. She's had a relatively recent colonoscopy as well as followup flexible sigmoidoscopy for her rectal adenoma.

## 2012-02-17 NOTE — Progress Notes (Signed)
Subjective:    Patient ID: Erica [name], female    DOB: [birth year], 78 y.o.   MRN: [id number]  HPI This very pleasant 78 year old widowed white woman is here for followup of reflux problems. She reports that she is having problems with regurgitation after her nighttime meal. In general she is not having much trouble during the day except for occasional heartburn or regurgitation. She does wait at least 3 hours, probably 4 or so for retiring and lying down. When she does this she notices that she starts to regurgitate some food. It may occur before she lies down. She not really having nausea and vomiting that she will have episodic spells of that, she has delayed gastric emptying and gastroparesis, as diagnosed by 2005 gastric emptying scan. She was on Reglan at one point and wonders if she should try that again, she does not recall why she stopped it.  She drinks about 2 cups of coffee a day 1 in the morning and one in the evening. He is not a smoker or user of tobacco. Her weight has been overall stable. It is no dysphagia. She does occasionally get hoarse when she has regurgitation episodes.  She reports that her bowel movements are small and ineffective despite taking a stool softener.  Allergies  Allergen Reactions  . Contrast Media (Iodinated Diagnostic Agents)   . Iodine   . Iohexol      Desc: hives, swelling over 5 yrs ago-pt has never been pre-medicated--was told to not have iv contrast ever    Outpatient Prescriptions Prior to Visit  Medication Sig Dispense Refill  . amiodarone (PACERONE) 200 MG tablet Take 200 mg by mouth daily.        Marland Kitchen aspirin 81 MG tablet Take 81 mg by mouth daily.        Marland Kitchen esomeprazole (NEXIUM) 40 MG capsule Take by mouth. As needed       . furosemide (LASIX) 20 MG tablet Take 20 mg by mouth. As needed       . olmesartan (BENICAR) 40 MG tablet Take 40 mg by mouth daily.        . rosuvastatin (CRESTOR) 10 MG tablet Take 10 mg by mouth daily.        Marland Kitchen  SYNTHROID 50 MCG tablet       .  Boniva   monthly       . promethazine (PHENERGAN) 12.5 MG tablet Take 12.5 mg by mouth as needed.       . clonazePAM (KLONOPIN) 0.5 MG tablet Take 0.5 mg by mouth. One before bed       .       Past Medical History  Diagnosis Date  . MVP (mitral valve prolapse)   . Atrial fibrillation   . Hyperlipidemia   . Hypothyroid   . GERD (gastroesophageal reflux disease)   . Gastroparesis   . Hypertension   . Chronic back pain   . Adenomatous rectal polyp   . Bleeding internal hemorrhoids    Past Surgical History  Procedure Date  . Cataract extraction   . Colonoscopy 02/2011    Tubulovillous adenoma rectal polyp  . Upper gastrointestinal endoscopy 08/23/2011    Normal  . Sigmoidoscopy 08/23/2011   History   Social History  . Marital Status: Married    Spouse Name: N/A    Number of Children: N/A  . Years of Education: N/A   Social History Main Topics  . Smoking status: Never Smoker   .  Smokeless tobacco: Never Used  . Alcohol Use: No  . Drug Use: No    Family History  Problem Relation Age of Onset  . Stomach cancer Brother   . Colon cancer Paternal Aunt          Review of Systems Low back pain, successfully treated by epidural injections recently - Dr. Newell Coral    Objective:   Physical Exam Well-developed elderly white woman in no acute distress The eyes are anicteric The lungs are clear Heart S1-S2 I hear no rubs murmurs or gallops, seems like a regular rhythm today The abdomen is slightly obese soft nontender there is no succussion splash, bowel sounds are present there is no mass       Assessment & Plan:

## 2012-02-23 ENCOUNTER — Telehealth: Payer: Self-pay | Admitting: Internal Medicine

## 2012-02-23 NOTE — Telephone Encounter (Signed)
Patient was seen in the office on 02/17/12.  She reports that her constipation has worsened and the reflux symptoms are not improving with the measures Dr Leone Payor gave her.  She would like to come back in the office and see him.  I have scheduled her an appt for 03/01/12

## 2012-03-01 ENCOUNTER — Ambulatory Visit (INDEPENDENT_AMBULATORY_CARE_PROVIDER_SITE_OTHER): Payer: Medicare Other | Admitting: Internal Medicine

## 2012-03-01 ENCOUNTER — Encounter: Payer: Self-pay | Admitting: Internal Medicine

## 2012-03-01 VITALS — BP 134/72 | HR 76 | Ht 62.0 in | Wt 162.2 lb

## 2012-03-01 DIAGNOSIS — K3184 Gastroparesis: Secondary | ICD-10-CM | POA: Diagnosis not present

## 2012-03-01 DIAGNOSIS — R1031 Right lower quadrant pain: Secondary | ICD-10-CM

## 2012-03-01 DIAGNOSIS — K219 Gastro-esophageal reflux disease without esophagitis: Secondary | ICD-10-CM | POA: Diagnosis not present

## 2012-03-01 DIAGNOSIS — R10813 Right lower quadrant abdominal tenderness: Secondary | ICD-10-CM

## 2012-03-01 DIAGNOSIS — K5900 Constipation, unspecified: Secondary | ICD-10-CM

## 2012-03-01 NOTE — Patient Instructions (Signed)
You have been scheduled for a CT scan of the abdomen and pelvis at [facility] ([address]---this is in the same building as [facility]).   You are scheduled on 03/07/12 at 11:00am. You should arrive 15 minutes prior to your appointment time for registration. Please follow the written instructions below on the day of your exam:  You have been given 2 bottles of oral contrast to drink. The solution may taste better if refrigerated, but do NOT add ice or any other liquid to this solution. Shake well before drinking.  Starting at 7:00 am no solid foods.  You may drink liquids    Drink 1 bottle of contrast @ 9:00am (2 hours prior to your exam)  Drink 1 bottle of contrast @ 10:00am (1 hour prior to your exam)  You may take any medications as prescribed with a small amount of water except for the following: Metformin, Glucophage, Glucovance, Avandamet, Riomet, Fortamet, Actoplus Met, Janumet, Glumetza or Metaglip. The above medications must be held the day of the exam AND 48 hours after the exam.  The purpose of you drinking the oral contrast is to aid in the visualization of your intestinal tract. The contrast solution may cause some diarrhea. Before your exam is started, you will be given a small amount of fluid to drink. Depending on your individual set of symptoms, you may also receive an intravenous injection of x-ray contrast/dye. Plan on being at [facility] for 30 minutes or long, depending on the type of exam you are having performed.  If you have any questions regarding your exam or if you need to reschedule, you may call the CT department at [phone] between the hours of 8:00 am and 5:00 pm, Monday-Friday.  ________________________________________________________________________ Please start Miralax 17 grams or one capful in 8 oz. Of liquids of your choice daily.

## 2012-03-01 NOTE — Progress Notes (Signed)
  Subjective:    Patient ID: Erica [name], female    DOB: Mar 27, 1930, 78 y.o.   MRN: [id number]  HPI The patient returns having been seen about 2-3 weeks ago. She had increasing reflux symptoms and constipation. I recommended Metamucil and to try MiraLax if the Metamucil was unsuccessful. She is still going several days without defecation, and does not feel that Metamucil as effective though she has not started MiraLax. She also is regurgitating during and after meals. It's also occurring at night. She has a history of gastroparesis. She also now tells me that she has some pain in the right lower quadrant it was very late February but she did not mention that to me. She is worried and concerned that something bad might be going on.  Past medical history, surgical history medications are reviewed and updated and electronic medical record.  Review of Systems Some low back pain.    Objective:   Physical Exam General:  NAD Eyes:   anicteric Lungs:  clear Heart:  S1S2 no rubs, murmurs or gallops Abdomen:  soft and moderately tender in the right lower quadrant with deep palpation, BS+ Ext:   no edema            Assessment & Plan:   1. RLQ abdominal pain   2. RLQ abdominal tenderness   These problems are new. Correlation do at this visit. Overall does not seem severe but something has changed I can put my finger on what's causing things. The rapid regurgitation is not entirely consistent with gastroparesis. I'm going to have her get a CT scan of the abdomen and pelvis to evaluate her tenderness and pain in the right lower quadrant. She apparently had a severe reaction to CT contrast media in the past and we'll do this without contrast. Could need ultrasound depending upon the results of the CT.   3. GERD (gastroesophageal reflux disease)   This seems worse right now. I may start metoclopramide but am waiting on the CT scan.   4. Gastroparesis   5. Constipation   She will start  MiraLax. Intermittent Senokot or Dulcolax could be used also.     She's had a colonoscopy a year ago, a followup flexible sigmoidoscopy for rectal polyp and an upper endoscopy in August without any serious findings so I don't think there is need to be repeated.

## 2012-03-06 ENCOUNTER — Telehealth: Payer: Self-pay | Admitting: Internal Medicine

## 2012-03-06 NOTE — Telephone Encounter (Signed)
I do not think it is a problem for her to take the oral contrast we use today.

## 2012-03-06 NOTE — Telephone Encounter (Signed)
Patient aware.

## 2012-03-06 NOTE — Telephone Encounter (Signed)
Patient had questions about the type of contrast she has to drink tomorrow for CT.  She reports the reaction she had in the 80s was to 6 pills that she was given prior to the CT scan.  She was told then not to take iodine again.  I have assured her that this is a different type of contrast and does not contain iodine.  Her concern is that she had a reaction to the oral contrast she was given.  I have reviewed her chart with her and saw that she had rectal contrast on a CT from 05, she reports no problems with this.  Dr Leone Payor I am not familiar with a pill type of iodine please review and advise if ok to proceed with oral contrast tomorrow ?  I did check with CT and they are not aware of a pill form of contrast containing iodine.

## 2012-03-07 ENCOUNTER — Ambulatory Visit (INDEPENDENT_AMBULATORY_CARE_PROVIDER_SITE_OTHER)
Admission: RE | Admit: 2012-03-07 | Discharge: 2012-03-07 | Disposition: A | Discharge status: Home / Self Care | Payer: Medicare Other | Source: Ambulatory Visit | Attending: Internal Medicine | Admitting: Internal Medicine

## 2012-03-07 DIAGNOSIS — R142 Eructation: Secondary | ICD-10-CM | POA: Diagnosis not present

## 2012-03-07 DIAGNOSIS — R10813 Right lower quadrant abdominal tenderness: Secondary | ICD-10-CM

## 2012-03-07 DIAGNOSIS — K5732 Diverticulitis of large intestine without perforation or abscess without bleeding: Secondary | ICD-10-CM | POA: Diagnosis not present

## 2012-03-07 DIAGNOSIS — R1031 Right lower quadrant pain: Secondary | ICD-10-CM | POA: Diagnosis not present

## 2012-03-07 DIAGNOSIS — R143 Flatulence: Secondary | ICD-10-CM | POA: Diagnosis not present

## 2012-03-07 DIAGNOSIS — K5900 Constipation, unspecified: Secondary | ICD-10-CM | POA: Diagnosis not present

## 2012-03-07 NOTE — Progress Notes (Signed)
Quick Note:  CT does not show any problems  Start metaclopramide 5 mg before meals and at bedtime #120 1 refill - to help with regurgitation  Schedule rev 4-6 weeks    ______

## 2012-03-08 ENCOUNTER — Other Ambulatory Visit: Payer: Self-pay

## 2012-03-08 MED ORDER — METOCLOPRAMIDE HCL 5 MG PO TABS: 5.0000 mg | ORAL_TABLET | Freq: Three times a day (TID) | ORAL | Status: AC

## 2012-03-30 ENCOUNTER — Telehealth: Payer: Self-pay | Admitting: Internal Medicine

## 2012-03-30 NOTE — Telephone Encounter (Signed)
Patient has had irritability, insomnia, and uncontrollable movement of her mouth since starting on metoclopramide.  She is advised to stop it.  She has a follow up with Dr Leone Payor on 04/11/12.  Dr Leone Payor do you want to prescribe an alternative prior to her appt.  I have added this to her allergy/intolerance list.

## 2012-03-31 DIAGNOSIS — M48061 Spinal stenosis, lumbar region without neurogenic claudication: Secondary | ICD-10-CM | POA: Diagnosis not present

## 2012-03-31 NOTE — Telephone Encounter (Signed)
No - will reassess at rev

## 2012-04-07 ENCOUNTER — Encounter: Payer: Self-pay | Admitting: *Deleted

## 2012-04-11 ENCOUNTER — Ambulatory Visit (INDEPENDENT_AMBULATORY_CARE_PROVIDER_SITE_OTHER): Payer: Medicare Other | Admitting: Internal Medicine

## 2012-04-11 ENCOUNTER — Other Ambulatory Visit (INDEPENDENT_AMBULATORY_CARE_PROVIDER_SITE_OTHER): Payer: Medicare Other

## 2012-04-11 ENCOUNTER — Encounter: Payer: Self-pay | Admitting: Internal Medicine

## 2012-04-11 VITALS — BP 124/68 | HR 64 | Ht 62.0 in | Wt 158.2 lb

## 2012-04-11 DIAGNOSIS — R634 Abnormal weight loss: Secondary | ICD-10-CM

## 2012-04-11 DIAGNOSIS — K3184 Gastroparesis: Secondary | ICD-10-CM | POA: Diagnosis not present

## 2012-04-11 DIAGNOSIS — R110 Nausea: Secondary | ICD-10-CM

## 2012-04-11 LAB — CBC WITH DIFFERENTIAL/PLATELET
Basophils Absolute: 0 10*3/uL (ref 0.0–0.1)
Eosinophils Absolute: 0.1 10*3/uL (ref 0.0–0.7)
Eosinophils Relative: 0.9 % (ref 0.0–5.0)
MCV: 94.2 fl (ref 78.0–100.0)
Monocytes Absolute: 0.9 10*3/uL (ref 0.1–1.0)
Neutrophils Relative %: 62.4 % (ref 43.0–77.0)
Platelets: 255 10*3/uL (ref 150.0–400.0)
RDW: 15 % — ABNORMAL HIGH (ref 11.5–14.6)
WBC: 11 10*3/uL — ABNORMAL HIGH (ref 4.5–10.5)

## 2012-04-11 LAB — COMPREHENSIVE METABOLIC PANEL
ALT: 25 U/L (ref 0–35)
AST: 24 U/L (ref 0–37)
Albumin: 4 g/dL (ref 3.5–5.2)
Alkaline Phosphatase: 42 U/L (ref 39–117)
Glucose, Bld: 118 mg/dL — ABNORMAL HIGH (ref 70–99)
Potassium: 3.5 mEq/L (ref 3.5–5.1)
Sodium: 137 mEq/L (ref 135–145)
Total Bilirubin: 0.5 mg/dL (ref 0.3–1.2)
Total Protein: 6.9 g/dL (ref 6.0–8.3)

## 2012-04-11 MED ORDER — MIRTAZAPINE 7.5 MG PO TABS: 7.5000 mg | ORAL_TABLET | Freq: Every day | ORAL | Status: DC

## 2012-04-11 NOTE — Patient Instructions (Signed)
Your physician has requested that you go to the basement for the following lab work before leaving today: CBC, CMET, TSH  Don't pick up your RX for Remeron yet, Dr. Leone Payor wants to review your lab results first.  We will be in touch.  Follow-up with an appointment in 2 months.

## 2012-04-11 NOTE — Progress Notes (Signed)
  Subjective:    Patient ID: Erica [name], female    DOB: 11/01/1930, 78 y.o.   MRN: [id number]  HPI Ms. Tolen returns. CT scan of the abdomen and pelvis did not show any significant problems are causing her nausea and regurgitation problems. She is not vomiting, thinks her reflux is better. She still remains anxious. She is not sleeping well. Metoclopramide was tried for suspected gastroparesis symptoms but made her have some involuntary mouth movements and some other jitteriness and problem so it was stopped. She does seem to be having some restless leg movements which are not related to that. She is here with a young family member again today.  The patient reports that she still depressed after her husband died 18 months ago. She used to be on doxepin which helped her sleep but it wasn't working so she stopped it. She remains nauseous and feeling weak. She has lost about 5-10 pounds over the last year probably. She has been on amiodarone for several years it sounds like. He is not constipated.  Medications, allergies, past medical history, past surgical history, family history and social history are reviewed and updated in the EMR.   Review of Systems Restless legs and as above    Objective:   Physical Exam Elderly, overweight or obese, no acute distress       Assessment & Plan:   1. Nausea   2. Weight loss   3. Gastroparesis    Overall I think this is multifactorial. She is not really vomiting like one does with the gastroparesis flare. I think it's probably a combination of functional GI disturbance and some possible anxiety and depression overlay. She is intolerant of metoclopramide. She has had CT scanning and also had an upper endoscopy for similar problems in the late summer of 2012. I don't think that needs to be repeated. She's had recent colonoscopy as well.  It has been sometimes it she's had labs checked that she did have a low sodium in the past, I'm going to check a  CBC, a metabolic panel as well as a TSH. If those don't show any problems, then she can start Remeron 7.5 mg at bedtime and see me in 2 months. I think this may help her overall. I have explained to her that further testing is unlikely to yield anything different at this point.  I suppose amiodarone could be contributing to some of these problems, it is not clear to me, she sees her cardiologist tomorrow and I will forward a copy of the note to him to consider that though it sounds like she has a need for it.  Cc: Bennie Pierini, NP and Dr. Sharyn Lull

## 2012-04-11 NOTE — Progress Notes (Signed)
Quick Note:  Labs ok Start mirtazipine ______

## 2012-04-12 DIAGNOSIS — I10 Essential (primary) hypertension: Secondary | ICD-10-CM | POA: Diagnosis not present

## 2012-04-12 DIAGNOSIS — K219 Gastro-esophageal reflux disease without esophagitis: Secondary | ICD-10-CM | POA: Diagnosis not present

## 2012-04-12 DIAGNOSIS — R079 Chest pain, unspecified: Secondary | ICD-10-CM | POA: Diagnosis not present

## 2012-04-12 DIAGNOSIS — E039 Hypothyroidism, unspecified: Secondary | ICD-10-CM | POA: Diagnosis not present

## 2012-04-12 DIAGNOSIS — E7800 Pure hypercholesterolemia, unspecified: Secondary | ICD-10-CM | POA: Diagnosis not present

## 2012-04-13 ENCOUNTER — Other Ambulatory Visit (HOSPITAL_COMMUNITY): Payer: Self-pay | Admitting: Cardiology

## 2012-04-19 DIAGNOSIS — E785 Hyperlipidemia, unspecified: Secondary | ICD-10-CM | POA: Diagnosis not present

## 2012-04-19 DIAGNOSIS — I10 Essential (primary) hypertension: Secondary | ICD-10-CM | POA: Diagnosis not present

## 2012-04-19 DIAGNOSIS — E039 Hypothyroidism, unspecified: Secondary | ICD-10-CM | POA: Diagnosis not present

## 2012-04-19 DIAGNOSIS — I4891 Unspecified atrial fibrillation: Secondary | ICD-10-CM | POA: Diagnosis not present

## 2012-04-21 ENCOUNTER — Encounter (HOSPITAL_COMMUNITY)
Admission: RE | Admit: 2012-04-21 | Discharge: 2012-04-21 | Disposition: A | Discharge status: Home / Self Care | Payer: Medicare Other | Source: Ambulatory Visit | Attending: Cardiology | Admitting: Cardiology

## 2012-04-21 DIAGNOSIS — R079 Chest pain, unspecified: Secondary | ICD-10-CM | POA: Insufficient documentation

## 2012-04-21 MED ORDER — TECHNETIUM TC 99M TETROFOSMIN IV KIT
30.0000 | PACK | Freq: Once | INTRAVENOUS | Status: AC | PRN
  Administered 2012-04-21: 30 via INTRAVENOUS

## 2012-04-21 MED ORDER — TECHNETIUM TC 99M TETROFOSMIN IV KIT
10.0000 | PACK | Freq: Once | INTRAVENOUS | Status: AC | PRN
  Administered 2012-04-21: 10 via INTRAVENOUS

## 2012-04-21 MED ORDER — REGADENOSON 0.4 MG/5ML IV SOLN
INTRAVENOUS | Status: AC
  Administered 2012-04-21: 0.4 mg
  Filled 2012-04-21: qty 5

## 2012-04-25 DIAGNOSIS — L821 Other seborrheic keratosis: Secondary | ICD-10-CM | POA: Diagnosis not present

## 2012-04-25 DIAGNOSIS — D485 Neoplasm of uncertain behavior of skin: Secondary | ICD-10-CM | POA: Diagnosis not present

## 2012-05-02 DIAGNOSIS — F329 Major depressive disorder, single episode, unspecified: Secondary | ICD-10-CM | POA: Diagnosis not present

## 2012-05-06 ENCOUNTER — Other Ambulatory Visit: Payer: Self-pay | Admitting: Cardiology

## 2012-05-10 DIAGNOSIS — G4700 Insomnia, unspecified: Secondary | ICD-10-CM | POA: Diagnosis not present

## 2012-05-10 DIAGNOSIS — F329 Major depressive disorder, single episode, unspecified: Secondary | ICD-10-CM | POA: Diagnosis not present

## 2012-05-25 DIAGNOSIS — H35449 Age-related reticular degeneration of retina, unspecified eye: Secondary | ICD-10-CM | POA: Diagnosis not present

## 2012-05-25 DIAGNOSIS — H02839 Dermatochalasis of unspecified eye, unspecified eyelid: Secondary | ICD-10-CM | POA: Diagnosis not present

## 2012-05-25 DIAGNOSIS — Z961 Presence of intraocular lens: Secondary | ICD-10-CM | POA: Diagnosis not present

## 2012-05-25 DIAGNOSIS — H524 Presbyopia: Secondary | ICD-10-CM | POA: Diagnosis not present

## 2012-05-29 ENCOUNTER — Other Ambulatory Visit: Payer: Self-pay | Admitting: Cardiology

## 2012-06-06 ENCOUNTER — Ambulatory Visit (INDEPENDENT_AMBULATORY_CARE_PROVIDER_SITE_OTHER): Payer: Medicare Other | Admitting: Physician Assistant

## 2012-06-06 ENCOUNTER — Telehealth: Payer: Self-pay | Admitting: Internal Medicine

## 2012-06-06 ENCOUNTER — Encounter: Payer: Self-pay | Admitting: Physician Assistant

## 2012-06-06 ENCOUNTER — Other Ambulatory Visit (INDEPENDENT_AMBULATORY_CARE_PROVIDER_SITE_OTHER): Payer: Medicare Other

## 2012-06-06 VITALS — BP 120/60 | HR 60 | Ht 62.0 in | Wt 157.4 lb

## 2012-06-06 DIAGNOSIS — R52 Pain, unspecified: Secondary | ICD-10-CM

## 2012-06-06 DIAGNOSIS — K625 Hemorrhage of anus and rectum: Secondary | ICD-10-CM

## 2012-06-06 DIAGNOSIS — K5730 Diverticulosis of large intestine without perforation or abscess without bleeding: Secondary | ICD-10-CM

## 2012-06-06 DIAGNOSIS — F329 Major depressive disorder, single episode, unspecified: Secondary | ICD-10-CM | POA: Diagnosis not present

## 2012-06-06 DIAGNOSIS — R1084 Generalized abdominal pain: Secondary | ICD-10-CM

## 2012-06-06 DIAGNOSIS — Z9889 Other specified postprocedural states: Secondary | ICD-10-CM | POA: Diagnosis not present

## 2012-06-06 DIAGNOSIS — Z9049 Acquired absence of other specified parts of digestive tract: Secondary | ICD-10-CM

## 2012-06-06 DIAGNOSIS — F32A Depression, unspecified: Secondary | ICD-10-CM

## 2012-06-06 DIAGNOSIS — R110 Nausea: Secondary | ICD-10-CM

## 2012-06-06 DIAGNOSIS — K5790 Diverticulosis of intestine, part unspecified, without perforation or abscess without bleeding: Secondary | ICD-10-CM

## 2012-06-06 LAB — CBC WITH DIFFERENTIAL/PLATELET
Basophils Absolute: 0 10*3/uL (ref 0.0–0.1)
Eosinophils Absolute: 0.1 10*3/uL (ref 0.0–0.7)
Lymphocytes Relative: 24.4 % (ref 12.0–46.0)
MCHC: 33.2 g/dL (ref 30.0–36.0)
Monocytes Relative: 7 % (ref 3.0–12.0)
Neutrophils Relative %: 67.8 % (ref 43.0–77.0)
RBC: 4.34 Mil/uL (ref 3.87–5.11)
RDW: 12.6 % (ref 11.5–14.6)

## 2012-06-06 LAB — BASIC METABOLIC PANEL
BUN: 18 mg/dL (ref 6–23)
CO2: 31 mEq/L (ref 19–32)
Calcium: 9.5 mg/dL (ref 8.4–10.5)
Creatinine, Ser: 1.1 mg/dL (ref 0.4–1.2)
Glucose, Bld: 110 mg/dL — ABNORMAL HIGH (ref 70–99)

## 2012-06-06 NOTE — Patient Instructions (Addendum)
Do a full liquid diet tonight. Stop Aleve. Increase the Nexium to twice daily for the next 14 days then go back to once daily. We scheduled the CT scan at [facility]  CT [address] [facility] [address] Follow up with Dr. Mardene Speak have been scheduled for a CT scan of the abdomen and pelvis at [facility] CT ([address]---this is in the same building as Architectural technologist).   You are scheduled on 06-07-2012 at 2:30. You should arrive at 2:15 PM  to your appointment time for registration. Please follow the written instructions below on the day of your exam:  WARNING: IF YOU ARE ALLERGIC TO IODINE/X-RAY DYE, PLEASE NOTIFY RADIOLOGY IMMEDIATELY AT [phone]! YOU WILL BE GIVEN A 13 HOUR PREMEDICATION PREP.  1) Do not eat or drink anything after 10:30 AM  (4 hours prior to your test) 2) You have been given 2 bottles of oral contrast to drink. The solution may taste  better if refrigerated, but do NOT add ice or any other liquid to this solution. Shake well before drinking.    Drink 1 bottle of contrast @ 12:30 PM  (2 hours prior to your exam)  Drink 1 bottle of contrast @ 1:30 PM (1 hour prior to your exam)  You may take any medications as prescribed with a small amount of water except for the following: Metformin, Glucophage, Glucovance, Avandamet, Riomet, Fortamet, Actoplus Met, Janumet, Glumetza or Metaglip. The above medications must be held the day of the exam AND 48 hours after the exam.  The purpose of you drinking the oral contrast is to aid in the visualization of your intestinal tract. The contrast solution may cause some diarrhea. Before your exam is started, you will be given a small amount of fluid to drink. Depending on your individual set of symptoms, you may also receive an intravenous injection of x-ray contrast/dye. Plan on being at [facility] for 30 minutes or long, depending on the type of exam you are having performed.  If you have any questions regarding your exam or  if you need to reschedule, you may call the CT department at [phone] between the hours of 8:00 am and 5:00 pm, Monday-Friday.  ________________________________________________________________________

## 2012-06-06 NOTE — Telephone Encounter (Signed)
Patient has had lower abdominal cramping and rectal bleeding.  She reports bright red blood with each stool. Cramping is severe at times followed by a stool.  She will come in today and see Amy Esterwood PA at 3:30.  Patient will come in for BMET and CBC prior to the appt

## 2012-06-07 ENCOUNTER — Other Ambulatory Visit (INDEPENDENT_AMBULATORY_CARE_PROVIDER_SITE_OTHER): Payer: Medicare Other

## 2012-06-07 ENCOUNTER — Ambulatory Visit (INDEPENDENT_AMBULATORY_CARE_PROVIDER_SITE_OTHER)
Admission: RE | Admit: 2012-06-07 | Discharge: 2012-06-07 | Disposition: A | Discharge status: Home / Self Care | Payer: Medicare Other | Source: Ambulatory Visit | Attending: Physician Assistant | Admitting: Physician Assistant

## 2012-06-07 ENCOUNTER — Encounter: Payer: Self-pay | Admitting: Physician Assistant

## 2012-06-07 DIAGNOSIS — R1084 Generalized abdominal pain: Secondary | ICD-10-CM | POA: Diagnosis not present

## 2012-06-07 DIAGNOSIS — K625 Hemorrhage of anus and rectum: Secondary | ICD-10-CM | POA: Diagnosis not present

## 2012-06-07 DIAGNOSIS — K5790 Diverticulosis of intestine, part unspecified, without perforation or abscess without bleeding: Secondary | ICD-10-CM

## 2012-06-07 DIAGNOSIS — R52 Pain, unspecified: Secondary | ICD-10-CM | POA: Diagnosis not present

## 2012-06-07 DIAGNOSIS — K5730 Diverticulosis of large intestine without perforation or abscess without bleeding: Secondary | ICD-10-CM | POA: Diagnosis not present

## 2012-06-07 LAB — CBC WITH DIFFERENTIAL/PLATELET
Basophils Absolute: 0.1 10*3/uL (ref 0.0–0.1)
Eosinophils Absolute: 0 10*3/uL (ref 0.0–0.7)
Lymphocytes Relative: 19.4 % (ref 12.0–46.0)
MCHC: 33.3 g/dL (ref 30.0–36.0)
MCV: 93.6 fl (ref 78.0–100.0)
Monocytes Absolute: 1.1 10*3/uL — ABNORMAL HIGH (ref 0.1–1.0)
Neutrophils Relative %: 72.5 % (ref 43.0–77.0)
RDW: 13.1 % (ref 11.5–14.6)

## 2012-06-07 NOTE — Progress Notes (Signed)
Subjective:    Patient ID: Erica [name], female    DOB: [birth year], 78 y.o.   MRN: [id number]  HPI Erica [name] is a pleasant 78 year old female known to Dr. Leone Payor. She underwent upper endoscopy in 2012 which was normal and also had colonoscopy in March of 2012 with one sessile polyp removed from the rectum, and was noted to have severe diverticulosis. Patient comes in today as an acute add-on with abrupt onset early this morning after having a normal bowel movement with lower abdominal cramping followed by passage of bright red blood mixed with stool x1. She has not had any further bowel movements since but continues to complain of abdominal cramping and some fatigue. She says it was enough blood to scare her. Her appetite has been poor recently and she also says she's had problems with nausea over the past couple of months. Her daughter mentions that she has been depressed and started on Lexapro about 6 weeks ago but the nausea probably preceded this. She has been taking 2 Aleve every day. She is maintained on Nexium chronically 40 mg once daily in general he does not any problems with heartburn or indigestion. She has lost about 4 pounds over the past couple weeks.  Patient also mentions that she had an episode about 4 days ago with dizziness lightheadedness no change in her vision brief slurring of her words which resolved, and now her.her says she thinks that she does have a little bit of drooping of her mouth on the right side. Patient denies any residual dizziness, lightheadedness, numbness etc. she did not seek any medical attention after this episode.    Review of Systems  Constitutional: Positive for appetite change and fatigue.  HENT: Negative.   Eyes: Negative.   Respiratory: Negative.   Cardiovascular: Negative.   Gastrointestinal: Positive for nausea, abdominal pain and blood in stool.  Genitourinary: Negative.   Musculoskeletal: Negative.   Skin: Negative.   Neurological: Positive  for dizziness and facial asymmetry.  Hematological: Negative.   Psychiatric/Behavioral: Positive for dysphoric mood.   Outpatient Prescriptions Prior to Visit  Medication Sig Dispense Refill  . amiodarone (PACERONE) 200 MG tablet Take 200 mg by mouth daily.        Marland Kitchen aspirin 81 MG tablet Take 81 mg by mouth daily.        Marland Kitchen esomeprazole (NEXIUM) 40 MG capsule Take by mouth. As needed       . furosemide (LASIX) 20 MG tablet Take 20 mg by mouth. As needed       . olmesartan (BENICAR) 40 MG tablet Take 40 mg by mouth daily.        . promethazine (PHENERGAN) 12.5 MG tablet Take 12.5 mg by mouth as needed.       . psyllium (METAMUCIL SMOOTH TEXTURE) 58.6 % powder Take up to 1 tablespoon in 6-8 ounces of water daily may increase to twice daily if needed.  283 g  12  . rosuvastatin (CRESTOR) 10 MG tablet Take 10 mg by mouth daily.        Marland Kitchen SYNTHROID 50 MCG tablet Take 50 mcg by mouth daily.       . mirtazapine (REMERON) 7.5 MG tablet Take 1 tablet (7.5 mg total) by mouth at bedtime.  30 tablet  3   Allergies  Allergen Reactions  . Contrast Media (Iodinated Diagnostic Agents)   . Iodine   . Iohexol      Desc: hives, swelling over 5 yrs ago-pt has never been  pre-medicated--was told to not have iv contrast ever   . Metoclopramide Other (See Comments)    Mouth tremors, insomnia, and irritability   Patient Active Problem List  Diagnosis  . History of colon polyps  . GERD (gastroesophageal reflux disease)  . Constipation  . Gastroparesis  . Diverticulosis  . Depression  . S/P laparoscopic cholecystectomy   History   Social History  . Marital Status: Married    Spouse Name: N/A    Number of Children: 9  . Years of Education: N/A   Occupational History  . retired    Social History Main Topics  . Smoking status: Never Smoker   . Smokeless tobacco: Never Used  . Alcohol Use: No  . Drug Use: No  . Sexually Active: Not on file   Other Topics Concern  . Not on file   Social  History Narrative  . No narrative on file  pt widowed husband died one year ago.     Objective:   Physical Exam well-developed elderly white female in no acute distress, anxious accompanied by her daughter blood pressure 120/60 pulse 60 height 5 foot 2 weight 157. HEENT; nontraumatic normocephalic EOMI PERRLA sclera anicteric, slight facial droop on the right, Neck; Supple no JVD, Cardiovascular; regular rate and rhythm with S1-S2 no murmur or gallop, Pulmonary; clear bilaterally, Abdomen; soft she is tender in the right lower quadrant and left lower quadrant left mid quadrant is no guarding no rebound no palpable mass or hepatosplenomegaly bowel sounds are active, Rectal;; brownish red stool in the rectal vault, grossly positive. Extremities; no clubbing cyanosis or edema skin warm and dry, Psych; mood and affect normal and appropriate.       Assessment & Plan:  #64 78 year old female with acute onset earlier today of fairly intense lower, cramping followed by 1 bloody bowel movement Patient is seen 7 hours after the started and has not had any recurrent episodes. CBC done stat in the office today shows a WBC of 12.8 and hgb13.5 hematocrit of 40.7 and electrolytes within normal limits BUN 18 creatinine 1.1. I suspect she has had a segmental ischemic colitis. Cannot rule out low-grade diverticular bleeding though pain is unusual with this. #2 nausea x2 months, etiology not clear I wonder if this is functional secondary to her underlying depression and/or whether adding Lexapro is contributing to the nausea. She has also been taking NSAIDs and may have an NSAID gastropathy. #3 recent neurologic event rule out TIA versus small CVA. Event occurred 3 days ago and symptoms have resolved, but patient may have a new slight facial droop . I advised the see her primary care doctor tomorrow for further evaluation.  Plan; given above recent neuro event, patient will continue 1 aspirin daily, and stop Aleve    Schedule for CT scan of the abdomen and pelvis tomorrow a.m., further plans pending results Full liquid diet until symptoms improved Increase Nexium to 40 mg by mouth twice daily x2 weeks Followup with Dr. Leone Payor in one to 2 weeks,

## 2012-06-07 NOTE — Progress Notes (Signed)
I agree with ischemic colitis due to low flow state is a possibility. For that reason would  hold her Lasix for now

## 2012-06-08 ENCOUNTER — Telehealth: Payer: Self-pay | Admitting: Internal Medicine

## 2012-06-08 NOTE — Telephone Encounter (Signed)
I called results and explained that it looks like ischemic colitis. Still with some rectal bleeding (scanty). No fever, there is less abdominal pain and no diarrhea. She is on a "light diet". Definitely improving. To stay on same diet. We will call her to check on Monday 6/16 and make sure she has f/u me.

## 2012-06-12 ENCOUNTER — Ambulatory Visit: Payer: Medicare Other | Admitting: Physician Assistant

## 2012-06-12 NOTE — Telephone Encounter (Signed)
Patient reports that no further rectal bleeding.  No fever, she is tolerating a diet.  She also reports several months of mild nausea.  She has follow up on 06/20/12.  She is asked to continue her "light diet".  She will call back for worsening symptoms or new symptoms prior to the appt next week.

## 2012-06-15 DIAGNOSIS — M47817 Spondylosis without myelopathy or radiculopathy, lumbosacral region: Secondary | ICD-10-CM | POA: Diagnosis not present

## 2012-06-15 DIAGNOSIS — M5137 Other intervertebral disc degeneration, lumbosacral region: Secondary | ICD-10-CM | POA: Diagnosis not present

## 2012-06-15 DIAGNOSIS — M48061 Spinal stenosis, lumbar region without neurogenic claudication: Secondary | ICD-10-CM | POA: Diagnosis not present

## 2012-06-20 ENCOUNTER — Ambulatory Visit: Payer: Medicare Other | Admitting: Internal Medicine

## 2012-07-07 ENCOUNTER — Encounter: Payer: Self-pay | Admitting: *Deleted

## 2012-07-10 ENCOUNTER — Ambulatory Visit (INDEPENDENT_AMBULATORY_CARE_PROVIDER_SITE_OTHER): Payer: Medicare Other | Admitting: Internal Medicine

## 2012-07-10 ENCOUNTER — Encounter: Payer: Self-pay | Admitting: Internal Medicine

## 2012-07-10 VITALS — BP 148/80 | HR 53 | Ht 62.0 in | Wt 155.0 lb

## 2012-07-10 DIAGNOSIS — K559 Vascular disorder of intestine, unspecified: Secondary | ICD-10-CM | POA: Diagnosis not present

## 2012-07-10 DIAGNOSIS — R110 Nausea: Secondary | ICD-10-CM | POA: Diagnosis not present

## 2012-07-10 DIAGNOSIS — K5730 Diverticulosis of large intestine without perforation or abscess without bleeding: Secondary | ICD-10-CM | POA: Diagnosis not present

## 2012-07-10 DIAGNOSIS — K5790 Diverticulosis of intestine, part unspecified, without perforation or abscess without bleeding: Secondary | ICD-10-CM

## 2012-07-10 NOTE — Progress Notes (Signed)
Subjective:    Patient ID: NAVEEN LORUSSO, female    DOB: [birth year], 78 y.o.   MRN: [id number]  HPI The patient presents by herself for followup she has had several visits in the last 6-7 months because of nausea and. She was having regurgitation and reflux symptoms as well as nausea that did not seem to respond therapeutic maneuvers with changing her GERD regimen including adding mirtazapine for depression. She then developed crampy abdominal pain and bloody diarrhea. A CT scan showed segmental colitis in the descending colon. I thought that she had ischemic colitis. She has recovered from that though she has multiple bowel movements a day at this point. That diagnosis was made in late June. She was having a lot of constipation prior to that. She is not passing any more blood she has no significant abdominal pain.  She is asking if she can eat tomatoes and cucumber since she has diverticulosis.  Allergies  Allergen Reactions  . Contrast Media (Iodinated Diagnostic Agents)   . Iodine   . Iohexol      Desc: hives, swelling over 5 yrs ago-pt has never been pre-medicated--was told to not have iv contrast ever   . Metoclopramide Other (See Comments)    Mouth tremors, insomnia, and irritability   Outpatient Prescriptions Prior to Visit  Medication Sig Dispense Refill  . amiodarone (PACERONE) 200 MG tablet Take 200 mg by mouth daily.        Marland Kitchen aspirin 81 MG tablet Take 81 mg by mouth daily.        Marland Kitchen escitalopram (LEXAPRO) 20 MG tablet Take 20 mg by mouth daily.      Marland Kitchen esomeprazole (NEXIUM) 40 MG capsule Take by mouth. As needed       . furosemide (LASIX) 20 MG tablet Take 20 mg by mouth. As needed       . naproxen sodium (ANAPROX) 220 MG tablet Take 220 mg by mouth 2 (two) times daily.      Marland Kitchen olmesartan (BENICAR) 40 MG tablet Take 40 mg by mouth daily.        . promethazine (PHENERGAN) 12.5 MG tablet Take 12.5 mg by mouth as needed.       . psyllium (METAMUCIL SMOOTH TEXTURE) 58.6 % powder  Take up to 1 tablespoon in 6-8 ounces of water daily may increase to twice daily if needed.  283 g  12  . rosuvastatin (CRESTOR) 10 MG tablet Take 10 mg by mouth daily.        Marland Kitchen SYNTHROID 50 MCG tablet Take 50 mcg by mouth daily.        Past Medical History  Diagnosis Date  . MVP (mitral valve prolapse)   . Atrial fibrillation   . Hyperlipidemia   . Hypothyroid   . GERD (gastroesophageal reflux disease)   . Gastroparesis   . Hypertension   . Chronic back pain   . Adenomatous rectal polyp   . Bleeding internal hemorrhoids   . Lichen sclerosus et atrophicus of the vulva   . Diverticulosis of colon (without mention of hemorrhage)   . Hiatal hernia 09/1999    3cm  . TIA (transient ischemic attack)   . Gastritis   . Colitis    Past Surgical History  Procedure Date  . Cataract extraction   . Colonoscopy 02/2011    Tubulovillous adenoma rectal polyp  . Upper gastrointestinal endoscopy 08/23/2011    Normal  . Sigmoidoscopy 08/23/2011  . Tubal ligation   . Cholecystectomy  Review of Systems She reports that she does not feel depressed any more. She still indicates that she misses her husband, he died 2 years ago and she had been with him since the age of 54. She has a large and supportive family, fortunately. She also complains of intermittent low back pain and is followed by a neurosurgeon.    Objective:   Physical Exam Elderly white woman in no acute distress Eyes are anicteric Abdomen is soft and nontender       Assessment & Plan:   1. Ischemic colitis   Clinical scenario including CT scan evidence of segmental colitis consistent with ischemia. Low-flow state which was transient is suspected. She appears recovered though there is some residual frequent stools which should improve with time.   2. Nausea   Zoll. Etiology not entirely clear, question if it was a somatic component of depressive symptomatology. She stopped the mirtazapine.   3. Diverticulosis     Information handout provided, I see no reason why she cannot eat seeds and nuts as these are not proven to cause problems. She was warned to stop these if she developed abdominal pain or problems and a consistent basis related to their ingestion.    I will see her back as needed.  CC: Bennie Pierini, [name]

## 2012-07-10 NOTE — Patient Instructions (Addendum)
I am glad you are better.  You had ischemic colitis - low blood flow to colon causing inflammation. This is over though may still have some loose stools.  Not sure why you had nausea. Hopefully it will not return.  It is ok to eat sees with diverticulosis.  No more testing right now but call us if you have stomach trouble again.  Thank you for choosing me and [facility].

## 2012-07-11 DIAGNOSIS — Z124 Encounter for screening for malignant neoplasm of cervix: Secondary | ICD-10-CM | POA: Diagnosis not present

## 2012-07-11 DIAGNOSIS — Z1231 Encounter for screening mammogram for malignant neoplasm of breast: Secondary | ICD-10-CM | POA: Diagnosis not present

## 2012-07-20 DIAGNOSIS — M47817 Spondylosis without myelopathy or radiculopathy, lumbosacral region: Secondary | ICD-10-CM | POA: Diagnosis not present

## 2012-07-20 DIAGNOSIS — M48061 Spinal stenosis, lumbar region without neurogenic claudication: Secondary | ICD-10-CM | POA: Diagnosis not present

## 2012-08-22 DIAGNOSIS — H65199 Other acute nonsuppurative otitis media, unspecified ear: Secondary | ICD-10-CM | POA: Diagnosis not present

## 2012-09-16 DIAGNOSIS — M79609 Pain in unspecified limb: Secondary | ICD-10-CM | POA: Diagnosis not present

## 2012-09-16 DIAGNOSIS — R079 Chest pain, unspecified: Secondary | ICD-10-CM | POA: Diagnosis not present

## 2012-09-17 ENCOUNTER — Encounter (HOSPITAL_COMMUNITY): Payer: Self-pay | Admitting: *Deleted

## 2012-09-17 ENCOUNTER — Emergency Department (HOSPITAL_COMMUNITY)
Admission: EM | Admit: 2012-09-17 | Discharge: 2012-09-17 | Disposition: A | Discharge status: Home / Self Care | Payer: Medicare Other | Attending: Emergency Medicine | Admitting: Emergency Medicine

## 2012-09-17 DIAGNOSIS — I059 Rheumatic mitral valve disease, unspecified: Secondary | ICD-10-CM | POA: Diagnosis not present

## 2012-09-17 DIAGNOSIS — Z79899 Other long term (current) drug therapy: Secondary | ICD-10-CM | POA: Diagnosis not present

## 2012-09-17 DIAGNOSIS — E039 Hypothyroidism, unspecified: Secondary | ICD-10-CM | POA: Insufficient documentation

## 2012-09-17 DIAGNOSIS — E785 Hyperlipidemia, unspecified: Secondary | ICD-10-CM | POA: Insufficient documentation

## 2012-09-17 DIAGNOSIS — I10 Essential (primary) hypertension: Secondary | ICD-10-CM | POA: Diagnosis not present

## 2012-09-17 DIAGNOSIS — I4891 Unspecified atrial fibrillation: Secondary | ICD-10-CM | POA: Diagnosis not present

## 2012-09-17 DIAGNOSIS — Z8673 Personal history of transient ischemic attack (TIA), and cerebral infarction without residual deficits: Secondary | ICD-10-CM | POA: Diagnosis not present

## 2012-09-17 DIAGNOSIS — G8929 Other chronic pain: Secondary | ICD-10-CM | POA: Insufficient documentation

## 2012-09-17 LAB — CBC WITH DIFFERENTIAL/PLATELET
Basophils Absolute: 0.1 10*3/uL (ref 0.0–0.1)
Eosinophils Absolute: 0.2 10*3/uL (ref 0.0–0.7)
Eosinophils Relative: 2 % (ref 0–5)
Lymphs Abs: 4.2 10*3/uL — ABNORMAL HIGH (ref 0.7–4.0)
MCH: 31.5 pg (ref 26.0–34.0)
MCV: 91.8 fL (ref 78.0–100.0)
Platelets: 247 10*3/uL (ref 150–400)
RDW: 14.4 % (ref 11.5–15.5)

## 2012-09-17 LAB — COMPREHENSIVE METABOLIC PANEL
ALT: 25 U/L (ref 0–35)
Calcium: 10.1 mg/dL (ref 8.4–10.5)
Creatinine, Ser: 0.89 mg/dL (ref 0.50–1.10)
GFR calc Af Amer: 69 mL/min — ABNORMAL LOW (ref >90)
Glucose, Bld: 110 mg/dL — ABNORMAL HIGH (ref 70–99)
Sodium: 132 mEq/L — ABNORMAL LOW (ref 135–145)
Total Protein: 6.8 g/dL (ref 6.0–8.3)

## 2012-09-17 LAB — TROPONIN I: Troponin I: <0.3 ng/mL (ref <0.30)

## 2012-09-17 NOTE — ED Provider Notes (Signed)
History     CSN: [id number]  Arrival date & time 09/17/12  9562   First MD Initiated Contact with Patient 09/17/12 2103      Chief Complaint  Patient presents with  . Hypertension    (Consider location/radiation/quality/duration/timing/severity/associated sxs/prior treatment) Patient is a 78 y.o. female presenting with hypertension. The history is provided by the patient and a relative.  Hypertension Pertinent negatives include no chest pain, no abdominal pain, no headaches and no shortness of breath.   patient is a an 78 year old female with history of hypertension followed by cardiology. Blood pressure readings have been high since last evening with systolics in the low 200s. Patient has been essentially asymptomatic other than being nervous about the blood pressure being high. She denies any strokelike symptoms denies headache shortness of breath or chest pain. Patient has been treated for hypertension for a number of years. Her medication was last adjusted 3 months ago.  Past Medical History  Diagnosis Date  . MVP (mitral valve prolapse)   . Atrial fibrillation   . Hyperlipidemia   . Hypothyroid   . GERD (gastroesophageal reflux disease)   . Gastroparesis   . Hypertension   . Chronic back pain   . Adenomatous rectal polyp   . Bleeding internal hemorrhoids   . Lichen sclerosus et atrophicus of the vulva   . Diverticulosis of colon (without mention of hemorrhage)   . Hiatal hernia 09/1999    3cm  . TIA (transient ischemic attack)   . Ischemic colitis     Past Surgical History  Procedure Date  . Cataract extraction   . Colonoscopy 02/2011    Tubulovillous adenoma rectal polyp  . Upper gastrointestinal endoscopy 08/23/2011    Normal  . Sigmoidoscopy 08/23/2011  . Tubal ligation   . Cholecystectomy     Family History  Problem Relation Age of Onset  . Stomach cancer Brother   . Colon cancer Paternal Aunt   . Lung cancer Brother     smoker  . Esophageal cancer  Father     History  Substance Use Topics  . Smoking status: Never Smoker   . Smokeless tobacco: Never Used  . Alcohol Use: No    OB History    Grav Para Term Preterm Abortions TAB SAB Ect Mult Living                  Review of Systems  Constitutional: Negative for fever.  HENT: Negative for congestion.   Eyes: Negative for visual disturbance.  Respiratory: Negative for shortness of breath.   Cardiovascular: Negative for chest pain.  Gastrointestinal: Negative for nausea, vomiting and abdominal pain.  Genitourinary: Negative for dysuria.  Musculoskeletal: Negative for back pain.  Skin: Negative for rash.  Neurological: Negative for weakness, numbness and headaches.  Hematological: Does not bruise/bleed easily.    Allergies  Contrast media; Iodine; Iohexol; and Metoclopramide  Home Medications   Current Outpatient Rx  Name Route Sig Dispense Refill  . AMIODARONE HCL 200 MG PO TABS Oral Take 200 mg by mouth daily.      . ASPIRIN 81 MG PO TABS Oral Take 81 mg by mouth daily.      Marland Kitchen ESCITALOPRAM OXALATE 20 MG PO TABS Oral Take 20 mg by mouth daily.    Marland Kitchen ESOMEPRAZOLE MAGNESIUM 40 MG PO CPDR Oral Take 40 mg by mouth daily. For acid reflux    . FUROSEMIDE 20 MG PO TABS Oral Take 20 mg by mouth daily. For swelling    .  NAPROXEN SODIUM 220 MG PO TABS Oral Take 220 mg by mouth 2 (two) times daily.    Marland Kitchen OLMESARTAN MEDOXOMIL 40 MG PO TABS Oral Take 40 mg by mouth daily.      . PSYLLIUM 58.6 % PO POWD  Take up to 1 tablespoon in 6-8 ounces of water daily may increase to twice daily if needed. 283 g 12  . ROSUVASTATIN CALCIUM 10 MG PO TABS Oral Take 10 mg by mouth daily.      Marland Kitchen SYNTHROID 50 MCG PO TABS Oral Take 50 mcg by mouth daily.       BP 188/66  Pulse 64  Temp 97.8 F (36.6 C) (Oral)  Resp 18  SpO2 100%  Physical Exam  Nursing note and vitals reviewed. Constitutional: She is oriented to person, place, and time. She appears well-developed and well-nourished.  HENT:    Head: Normocephalic and atraumatic.  Mouth/Throat: Oropharynx is clear and moist.  Eyes: Conjunctivae normal and EOM are normal. Pupils are equal, round, and reactive to light.  Neck: Normal range of motion. Neck supple.  Cardiovascular: Normal rate, regular rhythm and normal heart sounds.   No murmur heard. Pulmonary/Chest: Effort normal and breath sounds normal. No respiratory distress. She has no wheezes. She has no rales.  Abdominal: Soft. Bowel sounds are normal. There is no tenderness.  Musculoskeletal: Normal range of motion. She exhibits no edema.  Neurological: She is alert and oriented to person, place, and time. No cranial nerve deficit. She exhibits normal muscle tone. Coordination normal.  Skin: Skin is warm. No rash noted.    ED Course  Procedures (including critical care time)  Labs Reviewed  CBC WITH DIFFERENTIAL - Abnormal; Notable for the following:    Lymphs Abs 4.2 (*)     All other components within normal limits  COMPREHENSIVE METABOLIC PANEL - Abnormal; Notable for the following:    Sodium 132 (*)     Potassium 3.4 (*)     Chloride 92 (*)     Glucose, Bld 110 (*)     GFR calc non Af Amer 60 (*)     GFR calc Af Amer 69 (*)     All other components within normal limits  TROPONIN I   No results found. Results for orders placed during the hospital encounter of 09/17/12  CBC WITH DIFFERENTIAL      Component Value Range   WBC 9.9  4.0 - 10.5 K/uL   RBC 4.28  3.87 - 5.11 MIL/uL   Hemoglobin 13.5  12.0 - 15.0 g/dL   HCT 21.3  08.6 - 57.8 %   MCV 91.8  78.0 - 100.0 fL   MCH 31.5  26.0 - 34.0 pg   MCHC 34.4  30.0 - 36.0 g/dL   RDW 46.9  62.9 - 52.8 %   Platelets 247  150 - 400 K/uL   Neutrophils Relative 45  43 - 77 %   Neutro Abs 4.5  1.7 - 7.7 K/uL   Lymphocytes Relative 42  12 - 46 %   Lymphs Abs 4.2 (*) 0.7 - 4.0 K/uL   Monocytes Relative 10  3 - 12 %   Monocytes Absolute 1.0  0.1 - 1.0 K/uL   Eosinophils Relative 2  0 - 5 %   Eosinophils Absolute  0.2  0.0 - 0.7 K/uL   Basophils Relative 1  0 - 1 %   Basophils Absolute 0.1  0.0 - 0.1 K/uL  COMPREHENSIVE METABOLIC PANEL  Component Value Range   Sodium 132 (*) 135 - 145 mEq/L   Potassium 3.4 (*) 3.5 - 5.1 mEq/L   Chloride 92 (*) 96 - 112 mEq/L   CO2 25  19 - 32 mEq/L   Glucose, Bld 110 (*) 70 - 99 mg/dL   BUN 11  6 - 23 mg/dL   Creatinine, Ser 1.61  0.50 - 1.10 mg/dL   Calcium 09.6  8.4 - 04.5 mg/dL   Total Protein 6.8  6.0 - 8.3 g/dL   Albumin 3.9  3.5 - 5.2 g/dL   AST 28  0 - 37 U/L   ALT 25  0 - 35 U/L   Alkaline Phosphatase 55  39 - 117 U/L   Total Bilirubin 0.3  0.3 - 1.2 mg/dL   GFR calc non Af Amer 60 (*) >90 mL/min   GFR calc Af Amer 69 (*) >90 mL/min  TROPONIN I      Component Value Range   Troponin I <0.30  <0.30 ng/mL     1. Hypertension       MDM   Patient with long-standing history of hypertension followed by Dr. Sharyn Lull from cardiology. Blood pressure was noted to be elevated since last p.m. getting systolics around 200 range. However patient has been essentially asymptomatic no chest pain no shortness of breath no severe headache no headache at all no strokelike symptoms. Patient very worried about the blood pressure. Here in the emergency department without any treatment blood pressure was 188/66. Patient's labs without sniffing abnormalities. Patient's oxygen saturations on room air her 100%. Patient's physical examination without any evidence of leg edema or rhonchi or rales in the lungs neurologic exam is normal. Patient can be discharged home keep a log of her blood pressure readings and give her cardiologist a call in the morning for followup this week. No acute intervention is required this is not a hypertensive emergency.       Shelda Jakes, MD 09/17/12 [phone]

## 2012-09-17 NOTE — ED Notes (Signed)
MD at bedside. 

## 2012-09-17 NOTE — ED Notes (Signed)
The pts bp has been high since last pm. She feels  anxious

## 2012-09-18 DIAGNOSIS — I10 Essential (primary) hypertension: Secondary | ICD-10-CM | POA: Diagnosis not present

## 2012-09-18 DIAGNOSIS — E7800 Pure hypercholesterolemia, unspecified: Secondary | ICD-10-CM | POA: Diagnosis not present

## 2012-09-28 ENCOUNTER — Other Ambulatory Visit (HOSPITAL_COMMUNITY): Payer: Self-pay | Admitting: Cardiology

## 2012-10-05 DIAGNOSIS — Z23 Encounter for immunization: Secondary | ICD-10-CM | POA: Diagnosis not present

## 2012-10-18 DIAGNOSIS — M546 Pain in thoracic spine: Secondary | ICD-10-CM | POA: Diagnosis not present

## 2012-10-18 DIAGNOSIS — M48061 Spinal stenosis, lumbar region without neurogenic claudication: Secondary | ICD-10-CM | POA: Diagnosis not present

## 2012-10-23 ENCOUNTER — Other Ambulatory Visit: Payer: Self-pay | Admitting: Neurosurgery

## 2012-10-23 DIAGNOSIS — M48061 Spinal stenosis, lumbar region without neurogenic claudication: Secondary | ICD-10-CM

## 2012-10-23 DIAGNOSIS — M546 Pain in thoracic spine: Secondary | ICD-10-CM

## 2012-10-30 DIAGNOSIS — I10 Essential (primary) hypertension: Secondary | ICD-10-CM | POA: Diagnosis not present

## 2012-10-30 DIAGNOSIS — R7309 Other abnormal glucose: Secondary | ICD-10-CM | POA: Diagnosis not present

## 2012-10-30 DIAGNOSIS — E7800 Pure hypercholesterolemia, unspecified: Secondary | ICD-10-CM | POA: Diagnosis not present

## 2012-10-31 ENCOUNTER — Ambulatory Visit
Admission: RE | Admit: 2012-10-31 | Discharge: 2012-10-31 | Disposition: A | Discharge status: Home / Self Care | Payer: Medicare Other | Source: Ambulatory Visit | Attending: Neurosurgery | Admitting: Neurosurgery

## 2012-10-31 DIAGNOSIS — M546 Pain in thoracic spine: Secondary | ICD-10-CM

## 2012-10-31 DIAGNOSIS — M5124 Other intervertebral disc displacement, thoracic region: Secondary | ICD-10-CM | POA: Diagnosis not present

## 2012-10-31 DIAGNOSIS — IMO0002 Reserved for concepts with insufficient information to code with codable children: Secondary | ICD-10-CM | POA: Diagnosis not present

## 2012-10-31 DIAGNOSIS — M48061 Spinal stenosis, lumbar region without neurogenic claudication: Secondary | ICD-10-CM

## 2012-11-07 DIAGNOSIS — M25559 Pain in unspecified hip: Secondary | ICD-10-CM | POA: Diagnosis not present

## 2012-11-09 DIAGNOSIS — M47817 Spondylosis without myelopathy or radiculopathy, lumbosacral region: Secondary | ICD-10-CM | POA: Diagnosis not present

## 2012-11-09 DIAGNOSIS — M48061 Spinal stenosis, lumbar region without neurogenic claudication: Secondary | ICD-10-CM | POA: Diagnosis not present

## 2012-11-09 DIAGNOSIS — M5137 Other intervertebral disc degeneration, lumbosacral region: Secondary | ICD-10-CM | POA: Diagnosis not present

## 2012-11-28 DIAGNOSIS — E7800 Pure hypercholesterolemia, unspecified: Secondary | ICD-10-CM | POA: Diagnosis not present

## 2012-11-28 DIAGNOSIS — E785 Hyperlipidemia, unspecified: Secondary | ICD-10-CM | POA: Diagnosis not present

## 2012-11-28 DIAGNOSIS — R7309 Other abnormal glucose: Secondary | ICD-10-CM | POA: Diagnosis not present

## 2012-11-28 DIAGNOSIS — D1739 Benign lipomatous neoplasm of skin and subcutaneous tissue of other sites: Secondary | ICD-10-CM | POA: Diagnosis not present

## 2012-11-28 DIAGNOSIS — I10 Essential (primary) hypertension: Secondary | ICD-10-CM | POA: Diagnosis not present

## 2012-12-11 ENCOUNTER — Telehealth: Payer: Self-pay | Admitting: Internal Medicine

## 2012-12-11 NOTE — Telephone Encounter (Signed)
Patient is offered an appt with Dr. Leone Payor for 12/15/12, but she is unable to come due to ride restrictions.  She will come in and see Mike Gip PA on 12/13/12.  She reports that she has been frequently vomiting after meals and that she occasionally sees blood streaks in it./

## 2012-12-13 ENCOUNTER — Ambulatory Visit (INDEPENDENT_AMBULATORY_CARE_PROVIDER_SITE_OTHER): Payer: Medicare Other | Admitting: Physician Assistant

## 2012-12-13 ENCOUNTER — Encounter: Payer: Self-pay | Admitting: Physician Assistant

## 2012-12-13 VITALS — BP 110/62 | HR 72 | Ht 62.0 in | Wt 159.6 lb

## 2012-12-13 DIAGNOSIS — K3184 Gastroparesis: Secondary | ICD-10-CM | POA: Diagnosis not present

## 2012-12-13 DIAGNOSIS — K219 Gastro-esophageal reflux disease without esophagitis: Secondary | ICD-10-CM | POA: Diagnosis not present

## 2012-12-13 DIAGNOSIS — R590 Localized enlarged lymph nodes: Secondary | ICD-10-CM

## 2012-12-13 DIAGNOSIS — R599 Enlarged lymph nodes, unspecified: Secondary | ICD-10-CM

## 2012-12-13 DIAGNOSIS — R634 Abnormal weight loss: Secondary | ICD-10-CM | POA: Diagnosis not present

## 2012-12-13 DIAGNOSIS — IMO0001 Reserved for inherently not codable concepts without codable children: Secondary | ICD-10-CM

## 2012-12-13 DIAGNOSIS — E785 Hyperlipidemia, unspecified: Secondary | ICD-10-CM

## 2012-12-13 DIAGNOSIS — I10 Essential (primary) hypertension: Secondary | ICD-10-CM | POA: Insufficient documentation

## 2012-12-13 MED ORDER — ERYTHROMYCIN BASE 250 MG PO TBEC: DELAYED_RELEASE_TABLET | ORAL | Status: DC

## 2012-12-13 NOTE — Progress Notes (Signed)
Agree with Ms. Esterwood's assessment and plan. Marranda Arakelian E. Sven Pinheiro, MD, FACG   

## 2012-12-13 NOTE — Progress Notes (Signed)
Subjective:    Patient ID: Erica [name], female    DOB: 1930-07-21, 78 y.o.   MRN: [id number]  HPI Erica [name] is a pleasant 78 year old white female known to Dr. Leone Payor with history of chronic GERD, previously documented gastroparesis several years ago, diverticular disease and colon polyps. She was last seen in the summer of 2013 when she had an episode of ischemic colitis. Last colonoscopy was done in March of 2012 showing moderate to severe left-sided diverticular disease. She had one small polyp in the rectum removed and this was a tubovillous adenoma. She had a followup flexible sigmoidoscopy in August of 2012 which showed only a scar in the rectum at the polypectomy site. Last endoscopy was also done in August of 2012 which was described as being normal. She was complaining of nausea and vomiting at that time and was felt to have had a flare of her gastroparesis. She comes in today with complaints of symptoms over the past 4-5 weeks with daily reflux and regurgitation after meals. She says generally she does not have much difficulty with breakfast but has more problems as the day goes on usually 15-20 minutes after eating she will reflux liquid and/or food that she has just eaten. She says she keeps a basin beside her bed at home in case she has to regurgitate She says if she actually does not regurgitate she feels nauseated. She denies any real heartburn or dysphagia at this time. She has no complaints of abdominal pain but says her appetite has been decreased and she thinks that she is "afraid to eat" seems to aggravate her symptoms. Her weight is down 45 pounds over the past month. She says she is just not hungry but denies any real early satiety symptoms. She's not noted any melena or hematochezia. She is on Nexium 40 mg once daily. She had been on Reglan several years ago says she was taken off of it and does not remember what type of symptoms she had. Her  allergies list that she had tremors and  some twitching. She is very concerned about her symptoms currently especially with the weight loss, she mentions that her follow died from esophageal cancer and that she would really like to have another endoscopy.  She also been taking some naproxen over the past few months because of some swelling under her left axilla . She was given this by primary care provider who told her to come back in a couple of months if it was not improved. She says she's noted some swelling there since late summer and feels that is actually gotten a bit bigger and that the naproxen has not made any difference. She says it  is a little bit sore and she feels a fullness there.    Review of Systems  Constitutional: Positive for appetite change and unexpected weight change.  HENT: Positive for trouble swallowing.   Eyes: Negative.   Respiratory: Negative.   Cardiovascular: Negative.   Gastrointestinal: Positive for nausea.  Genitourinary: Negative.   Musculoskeletal: Negative.   Skin: Negative.   Neurological: Negative.   Hematological: Negative.   Psychiatric/Behavioral: Negative.    Outpatient Prescriptions Prior to Visit  Medication Sig Dispense Refill  . amiodarone (PACERONE) 200 MG tablet Take 200 mg by mouth daily.        Marland Kitchen aspirin 81 MG tablet Take 81 mg by mouth daily.        Marland Kitchen escitalopram (LEXAPRO) 20 MG tablet Take 20 mg by mouth daily.      Marland Kitchen  esomeprazole (NEXIUM) 40 MG capsule Take 40 mg by mouth daily. For acid reflux      . furosemide (LASIX) 20 MG tablet Take 20 mg by mouth daily. For swelling      . naproxen sodium (ANAPROX) 220 MG tablet Take 220 mg by mouth 2 (two) times daily.      Marland Kitchen olmesartan (BENICAR) 40 MG tablet Take 40 mg by mouth daily.        . psyllium (METAMUCIL SMOOTH TEXTURE) 58.6 % powder Take up to 1 tablespoon in 6-8 ounces of water daily may increase to twice daily if needed.  283 g  12  . rosuvastatin (CRESTOR) 10 MG tablet Take 10 mg by mouth daily.        Marland Kitchen SYNTHROID 50  MCG tablet Take 50 mcg by mouth daily.        Last reviewed on 12/13/2012  3:34 PM by Sammuel Cooper, PA  Allergies  Allergen Reactions  . Contrast Media (Iodinated Diagnostic Agents)   . Iodine   . Iohexol      Desc: hives, swelling over 5 yrs ago-pt has never been pre-medicated--was told to not have iv contrast ever   . Metoclopramide Other (See Comments)    Mouth tremors, insomnia, and irritability   Patient Active Problem List  Diagnosis  . History of colon polyps  . GERD (gastroesophageal reflux disease)  . Gastroparesis  . Diverticulosis  . Depression  . S/P laparoscopic cholecystectomy  . HTN (hypertension)  . Hyperlipidemia   History  Substance Use Topics  . Smoking status: Never Smoker   . Smokeless tobacco: Never Used  . Alcohol Use: No   family history includes Colon cancer in her paternal aunt; Esophageal cancer in her father; Lung cancer in her brother; and Stomach cancer in her brother.     Objective:   Physical Exam well-developed elderly white female in no acute distress, pleasant appears younger than her stated age. Blood pressure 110/62 pulse 72 height 5 foot 2 weight 159. HEENT; nontraumatic normocephalic EOMI PERRLA sclera anicteric, Neck ;supple no JVD, Cardiovascular ;regular rate and rhythm with S1-S2 no murmur or gallop, Pulmonary; clear bilaterally, Patient has fullness in the left axilla which is fairly mobile there is no evidence of erythema or cellulitis.. Abdomen; soft nontender nondistended bowel sounds are active there is no palpable mass or hepatosplenomegaly, bowel sounds are present, Rectal ;exam not done, Extremities; no clubbing cyanosis or edema skin warm and dry, Psych; mood and affect normal and appropriate        Assessment & Plan:  #45  78 year old female with chronic GERD and previously documented gastroparesis several years ago now presenting with one month history of increased reflux symptoms and regurgitation postprandially,  associated with weight loss - I suspect this is secondary to an exacerbation of gastroparesis.  #2 history of adenomatous colon polyps-due for followup in 4 years based on overall health #3 hypertension #4 diverticulosis #5 left axillary swelling/adenopathy-persistent over the past four months-will need further imaging to exclude underlying neoplasm  Plan; continue Nexium but increase to 40 mg by mouth twice daily Start trial of erythromycin 250 mg by mouth 20 minutes for meals Schedule for upper endoscopy with Dr. Leone Payor Schedule for CT scan of the chest Gastroparesis diet

## 2012-12-13 NOTE — Patient Instructions (Addendum)
We scheduled the CT scan chest for tomorrow 12-14-2012. Arrive at 3:45 PM.  [place] [facility] [address] [employer]. You have been scheduled for an endoscopy with propofol. Please follow written instructions given to you at your visit today. If you use inhalers (even only as needed) or a CPAP machine, please bring them with you on the day of your procedure.  We sent prescription for the Erythromycin tablets to [facility].

## 2012-12-14 ENCOUNTER — Ambulatory Visit (INDEPENDENT_AMBULATORY_CARE_PROVIDER_SITE_OTHER)
Admission: RE | Admit: 2012-12-14 | Discharge: 2012-12-14 | Disposition: A | Discharge status: Home / Self Care | Payer: Medicare Other | Source: Ambulatory Visit | Attending: Physician Assistant | Admitting: Physician Assistant

## 2012-12-14 DIAGNOSIS — R918 Other nonspecific abnormal finding of lung field: Secondary | ICD-10-CM | POA: Diagnosis not present

## 2012-12-14 DIAGNOSIS — R590 Localized enlarged lymph nodes: Secondary | ICD-10-CM

## 2012-12-14 DIAGNOSIS — K219 Gastro-esophageal reflux disease without esophagitis: Secondary | ICD-10-CM | POA: Diagnosis not present

## 2012-12-14 DIAGNOSIS — R599 Enlarged lymph nodes, unspecified: Secondary | ICD-10-CM

## 2012-12-18 ENCOUNTER — Telehealth: Payer: Self-pay | Admitting: Internal Medicine

## 2012-12-18 NOTE — Telephone Encounter (Signed)
Spoke with patient and told her the CT did not show any problems with lymph nodes or anything in her chest. Suggested she make an appointment with her PCP for further management.

## 2012-12-19 ENCOUNTER — Telehealth: Payer: Self-pay | Admitting: Physician Assistant

## 2012-12-19 NOTE — Telephone Encounter (Signed)
I asked RF to speak with the patient. The patient sent her daughter, Erica [name], with a signed note, to obtain a copy of her CT on disc and the report...Marland Kitchendjc

## 2012-12-21 DIAGNOSIS — N63 Unspecified lump in unspecified breast: Secondary | ICD-10-CM | POA: Diagnosis not present

## 2012-12-25 ENCOUNTER — Encounter: Payer: Self-pay | Admitting: Internal Medicine

## 2012-12-25 ENCOUNTER — Ambulatory Visit (AMBULATORY_SURGERY_CENTER): Payer: Medicare Other | Admitting: Internal Medicine

## 2012-12-25 VITALS — BP 145/66 | HR 56 | Temp 97.4°F | Resp 19 | Ht 62.0 in | Wt 159.0 lb

## 2012-12-25 DIAGNOSIS — I4891 Unspecified atrial fibrillation: Secondary | ICD-10-CM | POA: Diagnosis not present

## 2012-12-25 DIAGNOSIS — K219 Gastro-esophageal reflux disease without esophagitis: Secondary | ICD-10-CM

## 2012-12-25 DIAGNOSIS — I059 Rheumatic mitral valve disease, unspecified: Secondary | ICD-10-CM | POA: Diagnosis not present

## 2012-12-25 DIAGNOSIS — K3184 Gastroparesis: Secondary | ICD-10-CM | POA: Diagnosis not present

## 2012-12-25 DIAGNOSIS — I10 Essential (primary) hypertension: Secondary | ICD-10-CM | POA: Diagnosis not present

## 2012-12-25 DIAGNOSIS — E079 Disorder of thyroid, unspecified: Secondary | ICD-10-CM | POA: Diagnosis not present

## 2012-12-25 DIAGNOSIS — R634 Abnormal weight loss: Secondary | ICD-10-CM

## 2012-12-25 DIAGNOSIS — E785 Hyperlipidemia, unspecified: Secondary | ICD-10-CM | POA: Diagnosis not present

## 2012-12-25 MED ORDER — SODIUM CHLORIDE 0.9 % IV SOLN: 500.0000 mL | INTRAVENOUS | Status: DC

## 2012-12-25 NOTE — Op Note (Signed)
[facility] [address] [place], [address]   ENDOSCOPY PROCEDURE REPORT  PATIENT: Erica, [name]  MR#: [id number] BIRTHDATE: 08-13-1930 , 82  yrs. old GENDER: Female ENDOSCOPIST: Iva Boop, MD, [place] PROCEDURE DATE:  12/25/2012 PROCEDURE:  EGD, diagnostic ASA CLASS:     Class III INDICATIONS:  Weight loss.   Follow up of esophageal reflux. MEDICATIONS: propofol (Diprivan) 100mg  IV, MAC sedation, administered by CRNA, and These medications were titrated to patient response per physician's verbal order TOPICAL ANESTHETIC: Cetacaine Spray  DESCRIPTION OF PROCEDURE: After the risks benefits and alternatives of the procedure were thoroughly explained, informed consent was obtained.  The FUSE Demo Scope endoscope was introduced through the mouth and advanced to the second portion of the duodenum. Without limitations.  The instrument was slowly withdrawn as the mucosa was fully examined.      The upper, middle and distal third of the esophagus were carefully inspected and no abnormalities were noted.  The z-line was well seen at the GEJ.  The endoscope was pushed into the fundus which was normal including a retroflexed view.  The antrum, gastric body, first and second part of the duodenum were unremarkable. Retroflexed views revealed no abnormalities.     The scope was then withdrawn from the patient and the procedure completed.  COMPLICATIONS: There were no complications. ENDOSCOPIC IMPRESSION: Normal EGD - problems seem to be from gastroparesis Images were taken and printed and will be uploaded to EMR (part of FUSE demo) RECOMMENDATIONS: Gastroparesis diet See me as needed   eSigned:  Iva Boop, MD, [facility] 12/25/2012 12:25 PM   XB:JYNW Benjamin Stain, NP and The Patient

## 2012-12-25 NOTE — Patient Instructions (Addendum)
The upper endoscopy exam is normal - no tumors, ulcers or other problems seen. You have problems with delayed stomach emptying called gastroparesis.  Please follow the gastroparesis diet handout.  If things are not going well all me back/come see me.  Unfortunately there are not very effective medicines for this and you have had side effects from one of them.  Thank you for choosing me and [facility].  Iva Boop, MD, FACG  YOU HAD AN ENDOSCOPIC PROCEDURE TODAY AT THE [facility]: Refer to the procedure report that was given to you for any specific questions about what was found during the examination.  If the procedure report does not answer your questions, please call your gastroenterologist to clarify.  If you requested that your care partner not be given the details of your procedure findings, then the procedure report has been included in a sealed envelope for you to review at your convenience later.  YOU SHOULD EXPECT: Some feelings of bloating in the abdomen. Passage of more gas than usual.  Walking can help get rid of the air that was put into your GI tract during the procedure and reduce the bloating. If you had a lower endoscopy (such as a colonoscopy or flexible sigmoidoscopy) you may notice spotting of blood in your stool or on the toilet paper. If you underwent a bowel prep for your procedure, then you may not have a normal bowel movement for a few days.  DIET: Your first meal following the procedure should be a light meal and then it is ok to progress to your normal diet.  A half-sandwich or bowl of soup is an example of a good first meal.  Heavy or fried foods are harder to digest and may make you feel nauseous or bloated.  Likewise meals heavy in dairy and vegetables can cause extra gas to form and this can also increase the bloating.  Drink plenty of fluids but you should avoid alcoholic beverages for 24 hours.  ACTIVITY: Your care partner should  take you home directly after the procedure.  You should plan to take it easy, moving slowly for the rest of the day.  You can resume normal activity the day after the procedure however you should NOT DRIVE or use heavy machinery for 24 hours (because of the sedation medicines used during the test).    SYMPTOMS TO REPORT IMMEDIATELY: A gastroenterologist can be reached at any hour.  During normal business hours, 8:30 AM to 5:00 PM Monday through Friday, call [phone].  After hours and on weekends, please call the GI answering service at [phone] who will take a message and have the physician on call contact you.   Following upper endoscopy (EGD)  Vomiting of blood or coffee ground material  New chest pain or pain under the shoulder blades  Painful or persistently difficult swallowing  New shortness of breath  Fever of 100F or higher  Black, tarry-looking stools  FOLLOW UP: If any biopsies were taken you will be contacted by phone or by letter within the next 1-3 weeks.  Call your gastroenterologist if you have not heard about the biopsies in 3 weeks.  Our staff will call the home number listed on your records the next business day following your procedure to check on you and address any questions or concerns that you may have at that time regarding the information given to you following your procedure. This is a courtesy call and so if there is  no answer at the home number and we have not heard from you through the emergency physician on call, we will assume that you have returned to your regular daily activities without incident.  SIGNATURES/CONFIDENTIALITY: You and/or your care partner have signed paperwork which will be entered into your electronic medical record.  These signatures attest to the fact that that the information above on your After Visit Summary has been reviewed and is understood.  Full responsibility of the confidentiality of this discharge information lies with you  and/or your care-partner.

## 2012-12-25 NOTE — Progress Notes (Signed)
Patient did not experience any of the following events: a burn prior to discharge; a fall within the facility; wrong site/side/patient/procedure/implant event; or a hospital transfer or hospital admission upon discharge from the facility. (G8907) Patient did not have preoperative order for IV antibiotic SSI prophylaxis. (G8918)  

## 2012-12-26 ENCOUNTER — Encounter: Payer: Self-pay | Admitting: Internal Medicine

## 2012-12-26 ENCOUNTER — Telehealth: Payer: Self-pay | Admitting: *Deleted

## 2012-12-26 NOTE — Telephone Encounter (Signed)
  Follow up Call-  Call back number 12/25/2012 08/23/2011  Post procedure Call Back phone  # [phone]  Permission to leave phone message Yes -     Patient questions:  Do you have a fever, pain , or abdominal swelling? no Pain Score  0 *  Have you tolerated food without any problems? yes  Have you been able to return to your normal activities? yes  Do you have any questions about your discharge instructions: Diet   no Medications  no Follow up visit  no  Do you have questions or concerns about your Care? no  Actions: * If pain score is 4 or above: No action needed, pain <4.

## 2013-01-09 ENCOUNTER — Ambulatory Visit: Payer: Medicare Other | Admitting: Internal Medicine

## 2013-01-23 DIAGNOSIS — N63 Unspecified lump in unspecified breast: Secondary | ICD-10-CM | POA: Diagnosis not present

## 2013-01-23 DIAGNOSIS — N644 Mastodynia: Secondary | ICD-10-CM | POA: Diagnosis not present

## 2013-01-23 DIAGNOSIS — R928 Other abnormal and inconclusive findings on diagnostic imaging of breast: Secondary | ICD-10-CM | POA: Diagnosis not present

## 2013-02-01 DIAGNOSIS — M48061 Spinal stenosis, lumbar region without neurogenic claudication: Secondary | ICD-10-CM | POA: Diagnosis not present

## 2013-03-27 ENCOUNTER — Telehealth: Payer: Self-pay | Admitting: Nurse Practitioner

## 2013-03-27 NOTE — Telephone Encounter (Signed)
PT ONLY WANTED TO SEE MMM. OFFERED APPT FOR THURS OR Friday WITH MMM OR TODAY OR TOM WITH ANOTHER PROVIDER. PT REFUSED. Marland Kitchen PT SAID SHE WOULD CALL BACK. WANTED TO HAVE PLACE [facility] RCK. IT IS THE SAME PLACE MMM CHECKED IN DEC.

## 2013-03-28 ENCOUNTER — Other Ambulatory Visit: Payer: Self-pay | Admitting: Nurse Practitioner

## 2013-04-09 DIAGNOSIS — I119 Hypertensive heart disease without heart failure: Secondary | ICD-10-CM | POA: Diagnosis not present

## 2013-04-09 DIAGNOSIS — I10 Essential (primary) hypertension: Secondary | ICD-10-CM | POA: Diagnosis not present

## 2013-04-09 DIAGNOSIS — E7800 Pure hypercholesterolemia, unspecified: Secondary | ICD-10-CM | POA: Diagnosis not present

## 2013-04-19 DIAGNOSIS — I059 Rheumatic mitral valve disease, unspecified: Secondary | ICD-10-CM | POA: Diagnosis not present

## 2013-04-19 DIAGNOSIS — I10 Essential (primary) hypertension: Secondary | ICD-10-CM | POA: Diagnosis not present

## 2013-04-19 DIAGNOSIS — G459 Transient cerebral ischemic attack, unspecified: Secondary | ICD-10-CM | POA: Diagnosis not present

## 2013-04-19 DIAGNOSIS — I4891 Unspecified atrial fibrillation: Secondary | ICD-10-CM | POA: Diagnosis not present

## 2013-04-29 ENCOUNTER — Other Ambulatory Visit: Payer: Self-pay | Admitting: Nurse Practitioner

## 2013-04-30 NOTE — Telephone Encounter (Signed)
Patient last seen on 12-21-12. Please advise. Thank  you

## 2013-05-01 ENCOUNTER — Other Ambulatory Visit: Payer: Self-pay | Admitting: Nurse Practitioner

## 2013-05-02 NOTE — Telephone Encounter (Signed)
LAST OV 12/13. 

## 2013-05-17 DIAGNOSIS — M5137 Other intervertebral disc degeneration, lumbosacral region: Secondary | ICD-10-CM | POA: Diagnosis not present

## 2013-05-17 DIAGNOSIS — M47817 Spondylosis without myelopathy or radiculopathy, lumbosacral region: Secondary | ICD-10-CM | POA: Diagnosis not present

## 2013-05-17 DIAGNOSIS — M48061 Spinal stenosis, lumbar region without neurogenic claudication: Secondary | ICD-10-CM | POA: Diagnosis not present

## 2013-06-05 ENCOUNTER — Other Ambulatory Visit: Payer: Self-pay | Admitting: Nurse Practitioner

## 2013-06-05 DIAGNOSIS — L821 Other seborrheic keratosis: Secondary | ICD-10-CM | POA: Diagnosis not present

## 2013-06-05 DIAGNOSIS — L719 Rosacea, unspecified: Secondary | ICD-10-CM | POA: Diagnosis not present

## 2013-06-05 DIAGNOSIS — D239 Other benign neoplasm of skin, unspecified: Secondary | ICD-10-CM | POA: Diagnosis not present

## 2013-06-07 NOTE — Telephone Encounter (Signed)
Last thyroid 04/13

## 2013-06-24 ENCOUNTER — Other Ambulatory Visit: Payer: Self-pay | Admitting: Nurse Practitioner

## 2013-06-25 ENCOUNTER — Other Ambulatory Visit: Payer: Self-pay | Admitting: Obstetrics and Gynecology

## 2013-06-25 DIAGNOSIS — N649 Disorder of breast, unspecified: Secondary | ICD-10-CM | POA: Diagnosis not present

## 2013-06-25 DIAGNOSIS — N63 Unspecified lump in unspecified breast: Secondary | ICD-10-CM | POA: Diagnosis not present

## 2013-06-25 DIAGNOSIS — E559 Vitamin D deficiency, unspecified: Secondary | ICD-10-CM | POA: Diagnosis not present

## 2013-06-25 DIAGNOSIS — R5381 Other malaise: Secondary | ICD-10-CM | POA: Diagnosis not present

## 2013-06-25 DIAGNOSIS — N644 Mastodynia: Secondary | ICD-10-CM

## 2013-06-26 NOTE — Telephone Encounter (Signed)
Last seen 12/21/12  MMM 

## 2013-07-02 ENCOUNTER — Other Ambulatory Visit: Payer: Self-pay | Admitting: Nurse Practitioner

## 2013-07-03 ENCOUNTER — Other Ambulatory Visit: Payer: Self-pay | Admitting: Obstetrics and Gynecology

## 2013-07-03 ENCOUNTER — Ambulatory Visit
Admission: RE | Admit: 2013-07-03 | Discharge: 2013-07-03 | Disposition: A | Discharge status: Home / Self Care | Payer: Medicare Other | Source: Ambulatory Visit | Attending: Obstetrics and Gynecology | Admitting: Obstetrics and Gynecology

## 2013-07-03 DIAGNOSIS — N644 Mastodynia: Secondary | ICD-10-CM

## 2013-07-03 DIAGNOSIS — N6459 Other signs and symptoms in breast: Secondary | ICD-10-CM | POA: Diagnosis not present

## 2013-07-04 NOTE — Telephone Encounter (Signed)
LAST THYROID 4/13. NTBS.

## 2013-07-10 DIAGNOSIS — E7800 Pure hypercholesterolemia, unspecified: Secondary | ICD-10-CM | POA: Diagnosis not present

## 2013-07-10 DIAGNOSIS — I209 Angina pectoris, unspecified: Secondary | ICD-10-CM | POA: Diagnosis not present

## 2013-07-10 DIAGNOSIS — I10 Essential (primary) hypertension: Secondary | ICD-10-CM | POA: Diagnosis not present

## 2013-07-10 DIAGNOSIS — R0789 Other chest pain: Secondary | ICD-10-CM | POA: Diagnosis not present

## 2013-07-10 DIAGNOSIS — E039 Hypothyroidism, unspecified: Secondary | ICD-10-CM | POA: Diagnosis not present

## 2013-07-17 ENCOUNTER — Encounter (HOSPITAL_COMMUNITY)
Admission: RE | Disposition: A | Discharge status: Home / Self Care | Payer: Self-pay | Source: Ambulatory Visit | Attending: Cardiology

## 2013-07-17 ENCOUNTER — Ambulatory Visit (HOSPITAL_COMMUNITY)
Admission: RE | Admit: 2013-07-17 | Discharge: 2013-07-17 | Disposition: A | Discharge status: Home / Self Care | Payer: Medicare Other | Source: Ambulatory Visit | Attending: Cardiology | Admitting: Cardiology

## 2013-07-17 DIAGNOSIS — E039 Hypothyroidism, unspecified: Secondary | ICD-10-CM | POA: Insufficient documentation

## 2013-07-17 DIAGNOSIS — R079 Chest pain, unspecified: Secondary | ICD-10-CM | POA: Insufficient documentation

## 2013-07-17 DIAGNOSIS — Z91041 Radiographic dye allergy status: Secondary | ICD-10-CM | POA: Insufficient documentation

## 2013-07-17 DIAGNOSIS — I2510 Atherosclerotic heart disease of native coronary artery without angina pectoris: Secondary | ICD-10-CM | POA: Diagnosis not present

## 2013-07-17 DIAGNOSIS — K219 Gastro-esophageal reflux disease without esophagitis: Secondary | ICD-10-CM | POA: Diagnosis not present

## 2013-07-17 DIAGNOSIS — Z8249 Family history of ischemic heart disease and other diseases of the circulatory system: Secondary | ICD-10-CM | POA: Insufficient documentation

## 2013-07-17 DIAGNOSIS — I209 Angina pectoris, unspecified: Secondary | ICD-10-CM | POA: Diagnosis not present

## 2013-07-17 DIAGNOSIS — Z79899 Other long term (current) drug therapy: Secondary | ICD-10-CM | POA: Diagnosis not present

## 2013-07-17 DIAGNOSIS — I10 Essential (primary) hypertension: Secondary | ICD-10-CM | POA: Diagnosis not present

## 2013-07-17 DIAGNOSIS — Z7982 Long term (current) use of aspirin: Secondary | ICD-10-CM | POA: Insufficient documentation

## 2013-07-17 DIAGNOSIS — I447 Left bundle-branch block, unspecified: Secondary | ICD-10-CM | POA: Diagnosis not present

## 2013-07-17 DIAGNOSIS — E7800 Pure hypercholesterolemia, unspecified: Secondary | ICD-10-CM | POA: Diagnosis not present

## 2013-07-17 DIAGNOSIS — F411 Generalized anxiety disorder: Secondary | ICD-10-CM | POA: Insufficient documentation

## 2013-07-17 DIAGNOSIS — I119 Hypertensive heart disease without heart failure: Secondary | ICD-10-CM | POA: Insufficient documentation

## 2013-07-17 HISTORY — PX: LEFT HEART CATHETERIZATION WITH CORONARY ANGIOGRAM: SHX5451

## 2013-07-17 SURGERY — LEFT HEART CATHETERIZATION WITH CORONARY ANGIOGRAM
Anesthesia: LOCAL

## 2013-07-17 MED ORDER — ONDANSETRON HCL 4 MG/2ML IJ SOLN: 4.0000 mg | Freq: Four times a day (QID) | INTRAMUSCULAR | Status: DC | PRN

## 2013-07-17 MED ORDER — NITROGLYCERIN 0.2 MG/ML ON CALL CATH LAB
INTRAVENOUS | Status: AC
  Filled 2013-07-17: qty 1

## 2013-07-17 MED ORDER — FENTANYL CITRATE 0.05 MG/ML IJ SOLN
INTRAMUSCULAR | Status: AC
  Filled 2013-07-17: qty 2

## 2013-07-17 MED ORDER — DIAZEPAM 5 MG PO TABS
ORAL_TABLET | ORAL | Status: AC
  Filled 2013-07-17: qty 1

## 2013-07-17 MED ORDER — ACETAMINOPHEN 325 MG PO TABS: 650.0000 mg | ORAL_TABLET | ORAL | Status: DC | PRN

## 2013-07-17 MED ORDER — DIPHENHYDRAMINE HCL 50 MG/ML IJ SOLN
25.0000 mg | INTRAMUSCULAR | Status: AC
  Administered 2013-07-17: 25 mg via INTRAVENOUS

## 2013-07-17 MED ORDER — MIDAZOLAM HCL 2 MG/2ML IJ SOLN
INTRAMUSCULAR | Status: AC
  Filled 2013-07-17: qty 2

## 2013-07-17 MED ORDER — SODIUM CHLORIDE 0.9 % IJ SOLN: 3.0000 mL | INTRAMUSCULAR | Status: DC | PRN

## 2013-07-17 MED ORDER — LIDOCAINE HCL (PF) 1 % IJ SOLN
INTRAMUSCULAR | Status: AC
  Filled 2013-07-17: qty 30

## 2013-07-17 MED ORDER — SODIUM CHLORIDE 0.9 % IJ SOLN: 3.0000 mL | Freq: Two times a day (BID) | INTRAMUSCULAR | Status: DC

## 2013-07-17 MED ORDER — FAMOTIDINE IN NACL 20-0.9 MG/50ML-% IV SOLN
20.0000 mg | INTRAVENOUS | Status: AC
  Administered 2013-07-17 (×2): 20 mg via INTRAVENOUS

## 2013-07-17 MED ORDER — SODIUM CHLORIDE 0.9 % IV SOLN: INTRAVENOUS | Status: AC

## 2013-07-17 MED ORDER — DIAZEPAM 5 MG PO TABS
5.0000 mg | ORAL_TABLET | ORAL | Status: AC
  Administered 2013-07-17: 5 mg via ORAL

## 2013-07-17 MED ORDER — SODIUM CHLORIDE 0.9 % IV SOLN: 250.0000 mL | INTRAVENOUS | Status: DC | PRN

## 2013-07-17 MED ORDER — ASPIRIN 81 MG PO CHEW
CHEWABLE_TABLET | ORAL | Status: AC
  Filled 2013-07-17: qty 4

## 2013-07-17 MED ORDER — METHYLPREDNISOLONE SODIUM SUCC 125 MG IJ SOLR
INTRAMUSCULAR | Status: AC
  Filled 2013-07-17: qty 2

## 2013-07-17 MED ORDER — FAMOTIDINE IN NACL 20-0.9 MG/50ML-% IV SOLN
INTRAVENOUS | Status: AC
  Filled 2013-07-17: qty 50

## 2013-07-17 MED ORDER — SODIUM CHLORIDE 0.9 % IV SOLN
INTRAVENOUS | Status: DC
  Administered 2013-07-17: 07:00:00 via INTRAVENOUS

## 2013-07-17 MED ORDER — METHYLPREDNISOLONE SODIUM SUCC 125 MG IJ SOLR
125.0000 mg | INTRAMUSCULAR | Status: AC
  Administered 2013-07-17: 125 mg via INTRAVENOUS

## 2013-07-17 MED ORDER — HEPARIN (PORCINE) IN NACL 2-0.9 UNIT/ML-% IJ SOLN
INTRAMUSCULAR | Status: AC
  Filled 2013-07-17: qty 1000

## 2013-07-17 MED ORDER — ASPIRIN 81 MG PO CHEW
324.0000 mg | CHEWABLE_TABLET | ORAL | Status: AC
  Administered 2013-07-17: 324 mg via ORAL

## 2013-07-17 MED ORDER — DIPHENHYDRAMINE HCL 50 MG/ML IJ SOLN
INTRAMUSCULAR | Status: AC
  Filled 2013-07-17: qty 1

## 2013-07-17 NOTE — H&P (Signed)
  Handwritten H&P in the chart needs to be scanned. 

## 2013-07-17 NOTE — CV Procedure (Signed)
left cardiac cath report dictated on 07/17/2013 dictation number is [id number]

## 2013-07-17 NOTE — Cardiovascular Report (Signed)
Erica [name], Erica [name]               ACCOUNT NO.:  [id number]  MEDICAL RECORD NO.:  [id number]  LOCATION:                               FACILITY:  [facility]  PHYSICIAN:  Hoda Hon N. Sharyn Lull, M.D. DATE OF BIRTH:  1930/04/27  DATE OF PROCEDURE:  07/17/2013 DATE OF DISCHARGE:  07/17/2013                           CARDIAC CATHETERIZATION   PROCEDURE PERFORMED:  Left cardiac catheterization with selective left and right coronary angiography, left ventriculography via right groin using Judkins technique.  INDICATION FOR THE PROCEDURE:  Erica [name] is an 79 year old female with past medical history significant for hypertension, hypertensive heart disease with diastolic dysfunction, hypercholesteremia, hypothyroidism, GERD, anxiety disorder, complains of retrosternal chest heaviness with minimal exertion associated with feeling weak and tired and fatigued. The patient states she gets chest pain while going to mailbox and have to stop.  Denies any palpitation, lightheadedness, or syncope.  Denies PND, orthopnea, or leg swelling.  EKG done in the office showed normal sinus rhythm with left bundle-branch block.  The patient denies any rest or nocturnal angina.  Due to typical anginal chest pain and multiple risk factors, discussed with the patient regarding noninvasive stress testing versus left cath, possible PTCA stenting, its risks and benefits, i.e., death, MI, stroke, need for emergency CABG, local vascular complications, etc. and consented for the procedure.  DESCRIPTION OF PROCEDURE:  After obtaining the informed consent, the patient was brought to the cath lab and was placed on fluoroscopy table. Right groin was prepped and draped in usual fashion.  Xylocaine 1% was used for local anesthesia in the right groin.  With the help of thin wall needle, 5-French arterial sheath was placed.  The sheath was aspirated and flushed.  Next, 5-French left Judkins catheter was advanced over the wire under  fluoroscopic guidance up to the ascending aorta.  Wire was pulled out.  The catheter was aspirated and connected to the Manifold.  Catheter was further advanced and engaged into left coronary ostium.  Multiple views of the left system were taken.  Next, catheter was disengaged and was pulled out over the wire and was replaced with 5-French right Judkins catheter, which was advanced over the wire under fluoroscopic guidance up to the ascending aorta.  Wire was pulled out.  The catheter was aspirated and connected to the Manifold.  Catheter was further advanced and engaged into right coronary ostium.  Multiple views of the right system were taken.  Next, catheter was disengaged and was pulled out over the wire and was replaced with 5- French pigtail catheter which was advanced over the wire under fluoroscopic guidance up to the ascending aorta.  Wire was pulled out. The catheter was aspirated and connected to the Manifold.  Catheter was further advanced across the aortic valve into the LV.  LV pressures were recorded.  Next, LV graft was done in 30-degree RAO position.  Post- angiographic pressures were recorded from LV and then pullback pressures were recorded from the aorta.  There was no gradient across the aortic valve.  Next, the pigtail catheter was pulled out over the wire. Sheaths were aspirated and flushed.  FINDINGS:  LV showed good LV systolic function.  EF of 50%  to 55%.  Left main was patent.  LAD was patent in proximal and midportion, distally at the apex.  LAD is diffusely diseased.  There were no focal stenosis. Diagonal 1-3 were very small, which were patent.  Left circumflex was patent.  OM 1 was small which was patent.  OM 2 was moderate size, which was patent.  Left circumflex tapers down in AV groove after giving off OM 2.  RCA was patent.  PDA and PLV branches were patent.  The patient tolerated the procedure well.  There were no complications.  The patient was  transferred to recovery room in stable condition.     Eduardo Osier. Sharyn Lull, M.D.   ______________________________ Eduardo Osier. Sharyn Lull, M.D.    [facility]  D:  07/17/2013  T:  07/17/2013  Job:  [id number]

## 2013-07-24 DIAGNOSIS — I2510 Atherosclerotic heart disease of native coronary artery without angina pectoris: Secondary | ICD-10-CM | POA: Diagnosis not present

## 2013-07-24 DIAGNOSIS — E039 Hypothyroidism, unspecified: Secondary | ICD-10-CM | POA: Diagnosis not present

## 2013-07-24 DIAGNOSIS — E7800 Pure hypercholesterolemia, unspecified: Secondary | ICD-10-CM | POA: Diagnosis not present

## 2013-07-24 DIAGNOSIS — I10 Essential (primary) hypertension: Secondary | ICD-10-CM | POA: Diagnosis not present

## 2013-07-26 ENCOUNTER — Other Ambulatory Visit: Payer: Self-pay | Admitting: Nurse Practitioner

## 2013-07-27 ENCOUNTER — Other Ambulatory Visit: Payer: Self-pay

## 2013-07-27 NOTE — Telephone Encounter (Signed)
Last seen 12/21/12  MMM 

## 2013-07-27 NOTE — Telephone Encounter (Signed)
Last seen 12/21/12  MMM

## 2013-07-30 ENCOUNTER — Other Ambulatory Visit: Payer: Self-pay | Admitting: Nurse Practitioner

## 2013-07-30 MED ORDER — ESCITALOPRAM OXALATE 20 MG PO TABS: 20.0000 mg | ORAL_TABLET | Freq: Every day | ORAL | Status: DC

## 2013-07-31 DIAGNOSIS — M48061 Spinal stenosis, lumbar region without neurogenic claudication: Secondary | ICD-10-CM | POA: Diagnosis not present

## 2013-07-31 DIAGNOSIS — M47817 Spondylosis without myelopathy or radiculopathy, lumbosacral region: Secondary | ICD-10-CM | POA: Diagnosis not present

## 2013-07-31 DIAGNOSIS — M5137 Other intervertebral disc degeneration, lumbosacral region: Secondary | ICD-10-CM | POA: Diagnosis not present

## 2013-07-31 DIAGNOSIS — IMO0002 Reserved for concepts with insufficient information to code with codable children: Secondary | ICD-10-CM | POA: Diagnosis not present

## 2013-08-26 ENCOUNTER — Other Ambulatory Visit: Payer: Self-pay | Admitting: Nurse Practitioner

## 2013-08-30 NOTE — Telephone Encounter (Signed)
Last seen 12/21/12  MMM  Last thyroid level 04/19/12

## 2013-09-01 ENCOUNTER — Encounter: Payer: Self-pay | Admitting: *Deleted

## 2013-09-01 NOTE — Telephone Encounter (Signed)
This encounter was created in error - please disregard.

## 2013-09-03 ENCOUNTER — Telehealth: Payer: Self-pay | Admitting: Nurse Practitioner

## 2013-09-06 NOTE — Telephone Encounter (Signed)
Pt picked up med 09/03/13

## 2013-09-19 DIAGNOSIS — L940 Localized scleroderma [morphea]: Secondary | ICD-10-CM | POA: Diagnosis not present

## 2013-09-19 DIAGNOSIS — Z124 Encounter for screening for malignant neoplasm of cervix: Secondary | ICD-10-CM | POA: Diagnosis not present

## 2013-09-26 DIAGNOSIS — M5137 Other intervertebral disc degeneration, lumbosacral region: Secondary | ICD-10-CM | POA: Diagnosis not present

## 2013-09-26 DIAGNOSIS — IMO0002 Reserved for concepts with insufficient information to code with codable children: Secondary | ICD-10-CM | POA: Diagnosis not present

## 2013-09-26 DIAGNOSIS — M47817 Spondylosis without myelopathy or radiculopathy, lumbosacral region: Secondary | ICD-10-CM | POA: Diagnosis not present

## 2013-09-26 DIAGNOSIS — M48061 Spinal stenosis, lumbar region without neurogenic claudication: Secondary | ICD-10-CM | POA: Diagnosis not present

## 2013-10-01 ENCOUNTER — Other Ambulatory Visit: Payer: Self-pay | Admitting: General Practice

## 2013-10-02 ENCOUNTER — Telehealth: Payer: Self-pay | Admitting: Nurse Practitioner

## 2013-10-02 NOTE — Telephone Encounter (Signed)
NTBS.

## 2013-10-02 NOTE — Telephone Encounter (Signed)
Last seen 12/21/12  MMM

## 2013-10-02 NOTE — Telephone Encounter (Signed)
Last seen 07/12/12  MMM  Last thyroid level 04/11/12

## 2013-10-03 DIAGNOSIS — Z23 Encounter for immunization: Secondary | ICD-10-CM | POA: Diagnosis not present

## 2013-10-10 DIAGNOSIS — L940 Localized scleroderma [morphea]: Secondary | ICD-10-CM | POA: Diagnosis not present

## 2013-10-10 DIAGNOSIS — N390 Urinary tract infection, site not specified: Secondary | ICD-10-CM | POA: Diagnosis not present

## 2013-10-10 DIAGNOSIS — M899 Disorder of bone, unspecified: Secondary | ICD-10-CM | POA: Diagnosis not present

## 2013-10-10 DIAGNOSIS — N951 Menopausal and female climacteric states: Secondary | ICD-10-CM | POA: Diagnosis not present

## 2013-10-10 DIAGNOSIS — N393 Stress incontinence (female) (male): Secondary | ICD-10-CM | POA: Diagnosis not present

## 2013-10-19 ENCOUNTER — Telehealth: Payer: Self-pay | Admitting: Internal Medicine

## 2013-10-19 NOTE — Telephone Encounter (Signed)
Patient with 3 week history of GERD.  "Food comes up on me at night".  She will come in to see Dr. Leone Payor 10/29/13 10:30

## 2013-10-23 DIAGNOSIS — K219 Gastro-esophageal reflux disease without esophagitis: Secondary | ICD-10-CM | POA: Diagnosis not present

## 2013-10-23 DIAGNOSIS — I2510 Atherosclerotic heart disease of native coronary artery without angina pectoris: Secondary | ICD-10-CM | POA: Diagnosis not present

## 2013-10-23 DIAGNOSIS — I10 Essential (primary) hypertension: Secondary | ICD-10-CM | POA: Diagnosis not present

## 2013-10-23 DIAGNOSIS — E7800 Pure hypercholesterolemia, unspecified: Secondary | ICD-10-CM | POA: Diagnosis not present

## 2013-10-29 ENCOUNTER — Ambulatory Visit (INDEPENDENT_AMBULATORY_CARE_PROVIDER_SITE_OTHER): Payer: Medicare Other | Admitting: Internal Medicine

## 2013-10-29 ENCOUNTER — Encounter: Payer: Self-pay | Admitting: Internal Medicine

## 2013-10-29 ENCOUNTER — Ambulatory Visit: Payer: Self-pay | Admitting: Nurse Practitioner

## 2013-10-29 ENCOUNTER — Other Ambulatory Visit: Payer: Self-pay | Admitting: Nurse Practitioner

## 2013-10-29 VITALS — BP 114/60 | HR 64 | Ht 66.0 in | Wt 156.0 lb

## 2013-10-29 DIAGNOSIS — K219 Gastro-esophageal reflux disease without esophagitis: Secondary | ICD-10-CM

## 2013-10-29 DIAGNOSIS — K3184 Gastroparesis: Secondary | ICD-10-CM | POA: Diagnosis not present

## 2013-10-29 MED ORDER — ESOMEPRAZOLE MAGNESIUM 40 MG PO CPDR: 40.0000 mg | DELAYED_RELEASE_CAPSULE | Freq: Every day | ORAL | Status: DC

## 2013-10-29 MED ORDER — ALUM HYDROXIDE-MAG CARBONATE 95-358 MG/15ML PO SUSP: 30.0000 mL | Freq: Every day | ORAL | Status: DC

## 2013-10-29 NOTE — Progress Notes (Signed)
  Subjective:    Patient ID: Erica [name], female    DOB: 07/22/1930, 79 y.o.   MRN: [id number]  HPI Erica [name] is here for followup, she has known gastroparesis going back to least 2005 with delayed gastric emptying study. In late 2013 an EGD looks normal. She has been having flares of reflux lately, especially at night after supper and when she lies down. She will eat a meal at about 5 PM, she will have reflux problems often about 8 PM, she generally lives down to go to bed about 9 or 9:30  and also has problems then. There is regurgitation reflux and a feeling like she just vomited though she has not done so. She has not tried over-the-counter medications. It is similar to previous problems and when she eats raw vegetables, more recently she ate some small and had problems, it will cause more difficulty. It is frequent but not in every day or every evening problem. She denies dysphagia. She is very concerned about the gastroparesis and wonders about repeating a gastric emptying study. Medications, allergies, past medical history, past surgical history, family history and social history are reviewed and updated in the EMR.  Review of Systems As per history of present illness    Objective:   Physical Exam General:  NAD Eyes:   anicteric Abdomen:  soft and nontender, BS+, no bruits and no succussion splash Ext:   no edema  Data Reviewed:  2005 Gastric emptying study 2015 EGD     Assessment & Plan:  GERD (gastroesophageal reflux disease) - Plan: esomeprazole (NEXIUM) 40 MG capsule, aluminum hydroxide-magnesium carbonate (GAVISCON) 95-358 MG/15ML SUSP  Gastroparesis - Plan: esomeprazole (NEXIUM) 40 MG capsule, aluminum hydroxide-magnesium carbonate (GAVISCON) 95-358 MG/15ML SUSP

## 2013-10-29 NOTE — Patient Instructions (Addendum)
Today you have been given a gastroparesis handout and diet booklet.  Use Step 3 of this.  Please try and eliminate raw vegetables  Take Gavison as written on your medicine list please.  Consider changing your afternoon tea to decaf.   Take your Nexium 30 minutes prior to your supper meal.  It is the time of year to have a vaccination to prevent the flu (influenza virus).  Please have this done through your primary care provider or you can get this done at local pharmacies or the [facility]. It would be very helpful if you notify your primary care provider when and where you had the vaccination given by messaging them in My Chart, leaving a message or faxing the information.   I appreciate the opportunity to care for you.

## 2013-10-29 NOTE — Assessment & Plan Note (Addendum)
Nexium before supper not after like she is taking PRN Gaviscon Gastroparesis diet HOB is up Consider decaf coffee in afternoon - 1.5 cups caffeinated in AM

## 2013-10-29 NOTE — Assessment & Plan Note (Signed)
Reassured and explained that we did not need another emptying study Reduce raw vegetables - step 3 diet GERD changes Consider domperidone if ineffective and not contraindicated with cardiac issues - she is intolerant of metaclopramide RTC 6 weeks

## 2013-10-31 NOTE — Telephone Encounter (Signed)
Last seen 12/21/12  MMM  Last thyroid level 04/19/12

## 2013-10-31 NOTE — Telephone Encounter (Signed)
No more refills after this one until seen 

## 2013-11-16 ENCOUNTER — Encounter: Payer: Self-pay | Admitting: Nurse Practitioner

## 2013-11-16 ENCOUNTER — Ambulatory Visit (INDEPENDENT_AMBULATORY_CARE_PROVIDER_SITE_OTHER): Payer: Medicare Other | Admitting: Nurse Practitioner

## 2013-11-16 VITALS — BP 136/71 | HR 54 | Temp 97.2°F | Ht 66.0 in

## 2013-11-16 DIAGNOSIS — F329 Major depressive disorder, single episode, unspecified: Secondary | ICD-10-CM

## 2013-11-16 DIAGNOSIS — K219 Gastro-esophageal reflux disease without esophagitis: Secondary | ICD-10-CM

## 2013-11-16 DIAGNOSIS — I10 Essential (primary) hypertension: Secondary | ICD-10-CM | POA: Diagnosis not present

## 2013-11-16 DIAGNOSIS — Z23 Encounter for immunization: Secondary | ICD-10-CM | POA: Diagnosis not present

## 2013-11-16 DIAGNOSIS — E785 Hyperlipidemia, unspecified: Secondary | ICD-10-CM | POA: Diagnosis not present

## 2013-11-16 DIAGNOSIS — K3184 Gastroparesis: Secondary | ICD-10-CM

## 2013-11-16 MED ORDER — ROSUVASTATIN CALCIUM 10 MG PO TABS: 10.0000 mg | ORAL_TABLET | Freq: Every day | ORAL | Status: DC

## 2013-11-16 MED ORDER — RALOXIFENE HCL 60 MG PO TABS: 60.0000 mg | ORAL_TABLET | Freq: Every day | ORAL | Status: DC

## 2013-11-16 MED ORDER — AMIODARONE HCL 200 MG PO TABS: 200.0000 mg | ORAL_TABLET | Freq: Every day | ORAL | Status: DC

## 2013-11-16 MED ORDER — OLMESARTAN MEDOXOMIL 40 MG PO TABS: 40.0000 mg | ORAL_TABLET | Freq: Every day | ORAL | Status: DC

## 2013-11-16 MED ORDER — AMLODIPINE BESYLATE 5 MG PO TABS: 5.0000 mg | ORAL_TABLET | Freq: Every day | ORAL | Status: DC

## 2013-11-16 MED ORDER — ESOMEPRAZOLE MAGNESIUM 40 MG PO CPDR: 40.0000 mg | DELAYED_RELEASE_CAPSULE | Freq: Every day | ORAL | Status: DC

## 2013-11-16 MED ORDER — FUROSEMIDE 20 MG PO TABS: 20.0000 mg | ORAL_TABLET | Freq: Every day | ORAL | Status: DC

## 2013-11-16 MED ORDER — ESCITALOPRAM OXALATE 20 MG PO TABS: 20.0000 mg | ORAL_TABLET | Freq: Every day | ORAL | Status: DC

## 2013-11-16 MED ORDER — LEVOTHYROXINE SODIUM 50 MCG PO TABS: 50.0000 ug | ORAL_TABLET | Freq: Every day | ORAL | Status: DC

## 2013-11-16 NOTE — Progress Notes (Signed)
Subjective:    Patient ID: Erica [name], female    DOB: July 01, 1930, 79 y.o.   MRN: [id number]  HPI patient in today for follow up of chronic medical probems- She is doing quite well . Says that her depression has improved- Her only complaint today is of left breast pain which has been going on for awhile- she had mammogram in summer and was negative. Said that breast on left feels heavy and sore. Patient Active Problem List   Diagnosis Date Noted  . HTN (hypertension) 12/13/2012  . Hyperlipidemia 12/13/2012  . Depression 06/06/2012  . S/P laparoscopic cholecystectomy 06/06/2012  . Gastroparesis 02/17/2012  . History of colon polyps 08/06/2011  . GERD (gastroesophageal reflux disease) 08/06/2011   Outpatient Encounter Prescriptions as of 11/16/2013  Medication Sig  . aluminum hydroxide-magnesium carbonate (GAVISCON) 95-358 MG/15ML SUSP Take 30 mLs by mouth at bedtime. And as needed for other periods of indigestion  . amiodarone (PACERONE) 200 MG tablet Take 200 mg by mouth daily.    Marland Kitchen amLODipine (NORVASC) 5 MG tablet Take 5 mg by mouth daily.   Marland Kitchen aspirin 81 MG tablet Take 81 mg by mouth daily.    . clobetasol ointment (TEMOVATE) 0.05 %   . escitalopram (LEXAPRO) 20 MG tablet TAKE ONE TABLET BY MOUTH ONE  TIME DAILY  . esomeprazole (NEXIUM) 40 MG capsule Take 1 capsule (40 mg total) by mouth daily before supper.  . furosemide (LASIX) 20 MG tablet TAKE ONE TABLET BY MOUTH ONE TIME DAILY  . levothyroxine (SYNTHROID, LEVOTHROID) 50 MCG tablet Take 50 mcg by mouth daily before breakfast.  . meloxicam (MOBIC) 15 MG tablet Take 15 mg by mouth daily.  Marland Kitchen MIRVASO 0.33 % GEL   . naproxen sodium (ANAPROX) 220 MG tablet Take 220 mg by mouth 2 (two) times daily as needed. For pain  . olmesartan (BENICAR) 40 MG tablet Take 40 mg by mouth daily.  . psyllium (METAMUCIL SMOOTH TEXTURE) 58.6 % powder Take up to 1 tablespoon in 6-8 ounces of water daily may increase to twice daily if needed.  .  raloxifene (EVISTA) 60 MG tablet   . rosuvastatin (CRESTOR) 10 MG tablet Take 10 mg by mouth daily.    . [DISCONTINUED] BENICAR HCT 40-12.5 MG per tablet TAKE ONE TABLET BY MOUTH ONE TIME DAILY  . [DISCONTINUED] amoxicillin (AMOXIL) 500 MG capsule   . [DISCONTINUED] ampicillin (PRINCIPEN) 500 MG capsule   . [DISCONTINUED] BENICAR 40 MG tablet   . [DISCONTINUED] levothyroxine (SYNTHROID, LEVOTHROID) 50 MCG tablet TAKE ONE TABLET BY MOUTH ONE  TIME DAILY  . [DISCONTINUED] olmesartan-hydrochlorothiazide (BENICAR HCT) 40-12.5 MG per tablet Take 1 tablet by mouth daily.       Review of Systems  Constitutional: Negative.   HENT: Negative.   Respiratory: Negative.   Cardiovascular: Negative.   Gastrointestinal: Negative.   All other systems reviewed and are negative.       Objective:   Physical Exam  Constitutional: She is oriented to person, place, and time. She appears well-developed and well-nourished.  HENT:  Nose: Nose normal.  Mouth/Throat: Oropharynx is clear and moist.  Eyes: EOM are normal.  Neck: Trachea normal, normal range of motion and full passive range of motion without pain. Neck supple. No JVD present. Carotid bruit is not present. No thyromegaly present.  Cardiovascular: Normal rate, regular rhythm, normal heart sounds and intact distal pulses.  Exam reveals no gallop and no friction rub.   No murmur heard. Pulmonary/Chest: Effort normal and breath sounds  normal. Right breast exhibits no inverted nipple, no mass, no nipple discharge, no skin change and no tenderness. Left breast exhibits inverted nipple and tenderness. Left breast exhibits no mass, no nipple discharge and no skin change.  Abdominal: Soft. Bowel sounds are normal. She exhibits no distension and no mass. There is no tenderness.  Musculoskeletal: Normal range of motion.  Lymphadenopathy:    She has no cervical adenopathy.  Neurological: She is alert and oriented to person, place, and time. She has  normal reflexes.  Skin: Skin is warm and dry.  Psychiatric: She has a normal mood and affect. Her behavior is normal. Judgment and thought content normal.   BP 136/71  Pulse 54  Temp(Src) 97.2 F (36.2 C) (Oral)  Ht 5\' 6"  (1.676 m)        Assessment & Plan:   1. Hyperlipidemia   2. HTN (hypertension)   3. GERD (gastroesophageal reflux disease)   4. Depression   5. Need for 23-polyvalent pneumococcal polysaccharide vaccine   6. Gastroparesis    Orders Placed This Encounter  Procedures  . Pneumococcal polysaccharide vaccine 23-valent greater than or equal to 2yo subcutaneous/IM  . CMP14+EGFR  . NMR, lipoprofile   Meds ordered this encounter  Medications  . DISCONTD: BENICAR 40 MG tablet    Sig:   . DISCONTD: olmesartan (BENICAR) 40 MG tablet    Sig: Take 40 mg by mouth daily.  . raloxifene (EVISTA) 60 MG tablet    Sig: Take 1 tablet (60 mg total) by mouth daily.    Dispense:  90 tablet    Refill:  1    Order Specific Question:  Supervising Provider    Answer:  Ernestina Penna [[id number]]  . furosemide (LASIX) 20 MG tablet    Sig: Take 1 tablet (20 mg total) by mouth daily.    Dispense:  90 tablet    Refill:  1    Order Specific Question:  Supervising Provider    Answer:  Ernestina Penna [[id number]]  . escitalopram (LEXAPRO) 20 MG tablet    Sig: Take 1 tablet (20 mg total) by mouth daily.    Dispense:  90 tablet    Refill:  1    Order Specific Question:  Supervising Provider    Answer:  Ernestina Penna [[id number]]  . levothyroxine (SYNTHROID, LEVOTHROID) 50 MCG tablet    Sig: Take 1 tablet (50 mcg total) by mouth daily before breakfast.    Dispense:  90 tablet    Refill:  1    Order Specific Question:  Supervising Provider    Answer:  Ernestina Penna [[id number]]  . amLODipine (NORVASC) 5 MG tablet    Sig: Take 1 tablet (5 mg total) by mouth daily.    Dispense:  90 tablet    Refill:  1    Order Specific Question:  Supervising Provider    Answer:  Ernestina Penna [[id number]]  .  olmesartan (BENICAR) 40 MG tablet    Sig: Take 1 tablet (40 mg total) by mouth daily.    Dispense:  90 tablet    Refill:  1    Order Specific Question:  Supervising Provider    Answer:  Ernestina Penna [[id number]]  . esomeprazole (NEXIUM) 40 MG capsule    Sig: Take 1 capsule (40 mg total) by mouth daily before supper.    Order Specific Question:  Supervising Provider    Answer:  Ernestina Penna [[id number]]  . amiodarone (PACERONE) 200 MG tablet  Sig: Take 1 tablet (200 mg total) by mouth daily.    Dispense:  90 tablet    Refill:  1    Order Specific Question:  Supervising Provider    Answer:  Ernestina Penna [[id number]]  . rosuvastatin (CRESTOR) 10 MG tablet    Sig: Take 1 tablet (10 mg total) by mouth daily.    Dispense:  90 tablet    Refill:  1    Order Specific Question:  Supervising Provider    Answer:  Ernestina Penna [[id number]]   Will repeat mammogram the first of January Continue all meds Labs pending Diet and exercise encouraged Health maintenance reviewed Follow up in 3 months Pneumococcal vaccine today  Mary-Margaret Daphine Deutscher, [name]

## 2013-11-16 NOTE — Patient Instructions (Signed)

## 2013-11-18 LAB — CMP14+EGFR
ALT: 18 IU/L (ref 0–32)
AST: 24 IU/L (ref 0–40)
Albumin/Globulin Ratio: 2.1 (ref 1.1–2.5)
Alkaline Phosphatase: 54 IU/L (ref 39–117)
BUN/Creatinine Ratio: 17 (ref 11–26)
CO2: 26 mmol/L (ref 18–29)
Chloride: 100 mmol/L (ref 97–108)
GFR calc Af Amer: 79 mL/min/{1.73_m2} (ref >59)
GFR calc non Af Amer: 68 mL/min/{1.73_m2} (ref >59)
Globulin, Total: 1.9 g/dL (ref 1.5–4.5)
Glucose: 88 mg/dL (ref 65–99)
Potassium: 4.7 mmol/L (ref 3.5–5.2)
Sodium: 141 mmol/L (ref 134–144)
Total Bilirubin: 0.4 mg/dL (ref 0.0–1.2)
Total Protein: 5.8 g/dL — ABNORMAL LOW (ref 6.0–8.5)

## 2013-11-18 LAB — NMR, LIPOPROFILE
LDL Particle Number: 1175 nmol/L — ABNORMAL HIGH (ref <1000)
LDL Size: 21.1 nm (ref >20.5)
LP-IR Score: 55 — ABNORMAL HIGH (ref <=45)
Small LDL Particle Number: 653 nmol/L — ABNORMAL HIGH (ref <=527)
Triglycerides by NMR: 147 mg/dL (ref <150)

## 2013-12-11 ENCOUNTER — Ambulatory Visit (INDEPENDENT_AMBULATORY_CARE_PROVIDER_SITE_OTHER): Payer: Medicare Other | Admitting: Internal Medicine

## 2013-12-11 ENCOUNTER — Encounter: Payer: Self-pay | Admitting: Internal Medicine

## 2013-12-11 VITALS — BP 146/60 | Ht 62.0 in | Wt 160.6 lb

## 2013-12-11 DIAGNOSIS — K219 Gastro-esophageal reflux disease without esophagitis: Secondary | ICD-10-CM | POA: Diagnosis not present

## 2013-12-11 DIAGNOSIS — K3184 Gastroparesis: Secondary | ICD-10-CM | POA: Diagnosis not present

## 2013-12-11 MED ORDER — OMEPRAZOLE-SODIUM BICARBONATE 40-1100 MG PO CAPS: 1.0000 | ORAL_CAPSULE | Freq: Every day | ORAL | Status: DC

## 2013-12-11 NOTE — Patient Instructions (Addendum)
Try the sample of Zegerid in place of your Nexium for the next 5 days and see how that helps.  Call us back and let us know.   Wait at least 4 hours after eating before going to bed.  I appreciate the opportunity to care for you.

## 2013-12-11 NOTE — Assessment & Plan Note (Signed)
Related to her gastroparesis. We'll try Zegerid instead of Nexium. Domperidone not an option as per gastroparesis assessment and plan.

## 2013-12-11 NOTE — Assessment & Plan Note (Signed)
Still symptomatic Domperidone not a good option because of possible interactions with amiodarone Gaviscon not helping  Needs to have more time between eating and reclining - only taking 2-3 hrs Will try Zegerid samples instead of Nexium to see She does have HOB up

## 2013-12-11 NOTE — Progress Notes (Signed)
         Subjective:    Patient ID: Erica [name], female    DOB: 06-18-1930, 79 y.o.   MRN: [id number]  HPI Patient is here for followup gastroparesis. She continues to have nocturnal regurgitation. He is trying to work on her diet is recommended. She's taking the Nexium before supper. She does use Gaviscon as I recommended. Then in bed is elevated. She will retire to bed 2-3 hours after eating her evening meal. She is frustrated.  Medications, allergies, past medical history, past surgical history, family history and social history are reviewed and updated in the EMR.  Review of Systems As above    Objective:   Physical Exam Well-developed well-nourished elderly white woman in no acute distress    Assessment & Plan:   1. Gastroparesis   2. GERD (gastroesophageal reflux disease)

## 2013-12-18 ENCOUNTER — Telehealth: Payer: Self-pay | Admitting: Internal Medicine

## 2013-12-18 MED ORDER — OMEPRAZOLE-SODIUM BICARBONATE 40-1100 MG PO CAPS: 1.0000 | ORAL_CAPSULE | Freq: Every day | ORAL | Status: DC

## 2013-12-18 NOTE — Telephone Encounter (Signed)
rx sent in as requested 

## 2013-12-28 ENCOUNTER — Telehealth: Payer: Self-pay | Admitting: Internal Medicine

## 2013-12-28 NOTE — Telephone Encounter (Signed)
Received a prior authorization form, called [phone] and spoke to rep. Shanon Brow (female) to complete over the phone.  Dx: [phone], has tried and failed Nexium and Gavison.  Request has been sent for clinical review and patient notified of the status of this process.

## 2013-12-28 NOTE — Telephone Encounter (Signed)
Spoke to patient and told her to call her insurance company and find out what they will cover and to call me back with that information.

## 2013-12-31 NOTE — Telephone Encounter (Signed)
Received non-formulary exception approval of Zegerid thru 12/26/2014.  Pharmacy and patient notified.  Confirmation form sent to be scanned in.

## 2014-01-03 ENCOUNTER — Ambulatory Visit (INDEPENDENT_AMBULATORY_CARE_PROVIDER_SITE_OTHER): Payer: Medicare Other | Admitting: *Deleted

## 2014-01-03 ENCOUNTER — Other Ambulatory Visit: Payer: Self-pay | Admitting: Nurse Practitioner

## 2014-01-03 DIAGNOSIS — E531 Pyridoxine deficiency: Secondary | ICD-10-CM | POA: Diagnosis not present

## 2014-01-03 DIAGNOSIS — R5383 Other fatigue: Secondary | ICD-10-CM

## 2014-01-03 DIAGNOSIS — R5381 Other malaise: Secondary | ICD-10-CM | POA: Diagnosis not present

## 2014-01-03 NOTE — Progress Notes (Signed)
   Subjective:    Patient ID: Erica [name], female    DOB: Nov 23, 1930, 80 y.o.   MRN: [id number]  HPI    Review of Systems     Objective:   Physical Exam        Assessment & Plan:  .

## 2014-01-04 LAB — ANEMIA PROFILE B
BASOS ABS: 0.1 10*3/uL (ref 0.0–0.2)
Basos: 1 %
EOS ABS: 0.2 10*3/uL (ref 0.0–0.4)
Eos: 2 %
FERRITIN: 55 ng/mL (ref 15–150)
FOLATE: 15.6 ng/mL (ref >3.0)
HCT: 38.5 % (ref 34.0–46.6)
Hemoglobin: 13.1 g/dL (ref 11.1–15.9)
IRON SATURATION: 17 % (ref 15–55)
Immature Grans (Abs): 0 10*3/uL (ref 0.0–0.1)
Immature Granulocytes: 0 %
Iron: 55 ug/dL (ref 35–155)
LYMPHS ABS: 3.5 10*3/uL — AB (ref 0.7–3.1)
Lymphs: 37 %
MCH: 30.8 pg (ref 26.6–33.0)
MCHC: 34 g/dL (ref 31.5–35.7)
MCV: 90 fL (ref 79–97)
Monocytes Absolute: 0.9 10*3/uL (ref 0.1–0.9)
Monocytes: 9 %
Neutrophils Absolute: 4.9 10*3/uL (ref 1.4–7.0)
Neutrophils Relative %: 51 %
PLATELETS: 215 10*3/uL (ref 150–379)
RBC: 4.26 x10E6/uL (ref 3.77–5.28)
RDW: 13.6 % (ref 12.3–15.4)
Retic Ct Pct: 1.6 % (ref 0.6–2.6)
TIBC: 316 ug/dL (ref 250–450)
UIBC: 261 ug/dL (ref 150–375)
Vitamin B-12: 492 pg/mL (ref 211–946)
WBC: 9.6 10*3/uL (ref 3.4–10.8)

## 2014-01-04 LAB — THYROID PANEL WITH TSH
Free Thyroxine Index: 2.9 (ref 1.2–4.9)
T3 Uptake Ratio: 30 % (ref 24–39)
T4, Total: 9.5 ug/dL (ref 4.5–12.0)
TSH: 3.46 u[IU]/mL (ref 0.450–4.500)

## 2014-01-09 DIAGNOSIS — L940 Localized scleroderma [morphea]: Secondary | ICD-10-CM | POA: Diagnosis not present

## 2014-01-09 DIAGNOSIS — B373 Candidiasis of vulva and vagina: Secondary | ICD-10-CM | POA: Diagnosis not present

## 2014-01-09 DIAGNOSIS — B3731 Acute candidiasis of vulva and vagina: Secondary | ICD-10-CM | POA: Diagnosis not present

## 2014-01-09 DIAGNOSIS — N644 Mastodynia: Secondary | ICD-10-CM | POA: Diagnosis not present

## 2014-01-22 DIAGNOSIS — I10 Essential (primary) hypertension: Secondary | ICD-10-CM | POA: Diagnosis not present

## 2014-01-22 DIAGNOSIS — E039 Hypothyroidism, unspecified: Secondary | ICD-10-CM | POA: Diagnosis not present

## 2014-01-22 DIAGNOSIS — K219 Gastro-esophageal reflux disease without esophagitis: Secondary | ICD-10-CM | POA: Diagnosis not present

## 2014-01-22 DIAGNOSIS — E7800 Pure hypercholesterolemia, unspecified: Secondary | ICD-10-CM | POA: Diagnosis not present

## 2014-01-22 DIAGNOSIS — IMO0001 Reserved for inherently not codable concepts without codable children: Secondary | ICD-10-CM | POA: Diagnosis not present

## 2014-01-22 DIAGNOSIS — I2510 Atherosclerotic heart disease of native coronary artery without angina pectoris: Secondary | ICD-10-CM | POA: Diagnosis not present

## 2014-02-15 DIAGNOSIS — I2510 Atherosclerotic heart disease of native coronary artery without angina pectoris: Secondary | ICD-10-CM | POA: Diagnosis not present

## 2014-02-15 DIAGNOSIS — E7800 Pure hypercholesterolemia, unspecified: Secondary | ICD-10-CM | POA: Diagnosis not present

## 2014-02-15 DIAGNOSIS — I10 Essential (primary) hypertension: Secondary | ICD-10-CM | POA: Diagnosis not present

## 2014-02-25 DIAGNOSIS — M542 Cervicalgia: Secondary | ICD-10-CM | POA: Diagnosis not present

## 2014-02-25 DIAGNOSIS — M47817 Spondylosis without myelopathy or radiculopathy, lumbosacral region: Secondary | ICD-10-CM | POA: Diagnosis not present

## 2014-02-25 DIAGNOSIS — M5137 Other intervertebral disc degeneration, lumbosacral region: Secondary | ICD-10-CM | POA: Diagnosis not present

## 2014-02-25 DIAGNOSIS — I10 Essential (primary) hypertension: Secondary | ICD-10-CM | POA: Diagnosis not present

## 2014-02-27 ENCOUNTER — Encounter: Payer: Self-pay | Admitting: Internal Medicine

## 2014-03-18 ENCOUNTER — Telehealth: Payer: Self-pay | Admitting: Nurse Practitioner

## 2014-03-18 NOTE — Telephone Encounter (Signed)
Pt c/o leg swellling x 2 months. Seen by back doctor last week and he told her it wasn't from her back and make appt with family provider. Pt refused appt before Thursday. Appt. Made.

## 2014-03-21 ENCOUNTER — Encounter: Payer: Self-pay | Admitting: Nurse Practitioner

## 2014-03-21 ENCOUNTER — Ambulatory Visit (INDEPENDENT_AMBULATORY_CARE_PROVIDER_SITE_OTHER): Payer: Medicare Other | Admitting: Nurse Practitioner

## 2014-03-21 VITALS — BP 115/61 | HR 57 | Temp 97.8°F | Ht 62.0 in | Wt 157.0 lb

## 2014-03-21 DIAGNOSIS — M79609 Pain in unspecified limb: Secondary | ICD-10-CM | POA: Diagnosis not present

## 2014-03-21 DIAGNOSIS — M79662 Pain in left lower leg: Secondary | ICD-10-CM

## 2014-03-21 DIAGNOSIS — M79601 Pain in right arm: Secondary | ICD-10-CM

## 2014-03-21 DIAGNOSIS — M79602 Pain in left arm: Principal | ICD-10-CM

## 2014-03-21 MED ORDER — TRAMADOL HCL 50 MG PO TABS: 50.0000 mg | ORAL_TABLET | Freq: Three times a day (TID) | ORAL | Status: DC | PRN

## 2014-03-21 NOTE — Progress Notes (Signed)
   Subjective:    Patient ID: Erica [name], female    DOB: November 25, 1930, 80 y.o.   MRN: [id number]  HPI Patient in today c/o left leg swelling- more in calf- hurts her worse at night. Has been hurting for several months. * Patient also C/ o bil arm pain- achy pain that keeps her up at night.   Review of Systems  Constitutional: Negative.   HENT: Negative.   Respiratory: Negative.   Cardiovascular: Negative.   Gastrointestinal: Negative.   Genitourinary: Negative.   Musculoskeletal: Negative.   Neurological: Negative.   All other systems reviewed and are negative.       Objective:   Physical Exam  Constitutional: She is oriented to person, place, and time. She appears well-developed and well-nourished.  Cardiovascular: Normal rate, regular rhythm and normal heart sounds.   Pulmonary/Chest: Effort normal and breath sounds normal.  Musculoskeletal:  From of bil shoulders and elbows without pain. Pain left calf with palpation- no palpable nodule- (-) Homan sign  Neurological: She is alert and oriented to person, place, and time.  Skin: Skin is warm and dry.  Psychiatric: She has a normal mood and affect. Her behavior is normal. Judgment and thought content normal.  BP 115/61  Pulse 57  Temp(Src) 97.8 F (36.6 C) (Oral)  Ht $R'5\' 2"'wR$  (1.575 m)  Wt 157 lb (71.215 kg)  BMI 28.71 kg/m2         Assessment & Plan:   1. Bilateral arm pain   2. Pain of left calf    Orders Placed This Encounter  Procedures  . US Venous Img Lower Unilateral Left    Standing Status: Future     Number of Occurrences:      Standing Expiration Date: 05/22/2015    Order Specific Question:  Reason for Exam (SYMPTOM  OR DIAGNOSIS REQUIRED)    Answer:  left leg pain    Order Specific Question:  Preferred imaging location?    Answer:  [facility]  . Arthritis Panel  . BMP8+EGFR   Meds ordered this encounter  Medications  . spironolactone (ALDACTONE) 25 MG tablet    Sig:   . traMADol  (ULTRAM) 50 MG tablet    Sig: Take 1 tablet (50 mg total) by mouth every 8 (eight) hours as needed.    Dispense:  30 tablet    Refill:  0    Order Specific Question:  Supervising Provider    Answer:  Chipper Herb [[id number]]   Rest when hurting  Will wait on doppler study May due ortho referral if negative  Mary-Margaret Hassell Done, [name]

## 2014-03-21 NOTE — Patient Instructions (Signed)

## 2014-03-22 ENCOUNTER — Telehealth: Payer: Self-pay | Admitting: *Deleted

## 2014-03-22 ENCOUNTER — Other Ambulatory Visit: Payer: Self-pay | Admitting: Nurse Practitioner

## 2014-03-22 DIAGNOSIS — M2550 Pain in unspecified joint: Secondary | ICD-10-CM

## 2014-03-22 LAB — ARTHRITIS PANEL
Basophils Absolute: 0 10*3/uL (ref 0.0–0.2)
Basos: 0 %
EOS ABS: 0.1 10*3/uL (ref 0.0–0.4)
EOS: 1 %
HCT: 39.2 % (ref 34.0–46.6)
Hemoglobin: 13.3 g/dL (ref 11.1–15.9)
IMMATURE GRANS (ABS): 0 10*3/uL (ref 0.0–0.1)
IMMATURE GRANULOCYTES: 0 %
Lymphocytes Absolute: 3.2 10*3/uL — ABNORMAL HIGH (ref 0.7–3.1)
Lymphs: 34 %
MCH: 30.2 pg (ref 26.6–33.0)
MCHC: 33.9 g/dL (ref 31.5–35.7)
MCV: 89 fL (ref 79–97)
MONOS ABS: 0.8 10*3/uL (ref 0.1–0.9)
Monocytes: 9 %
NEUTROS PCT: 56 %
Neutrophils Absolute: 5.3 10*3/uL (ref 1.4–7.0)
PLATELETS: 217 10*3/uL (ref 150–379)
RBC: 4.41 x10E6/uL (ref 3.77–5.28)
RDW: 13.5 % (ref 12.3–15.4)
Rhuematoid fact SerPl-aCnc: 9.5 IU/mL (ref 0.0–13.9)
Sed Rate: 3 mm/hr (ref 0–40)
URIC ACID: 3.7 mg/dL (ref 2.5–7.1)
WBC: 9.6 10*3/uL (ref 3.4–10.8)

## 2014-03-22 LAB — BMP8+EGFR
BUN / CREAT RATIO: 19 (ref 11–26)
BUN: 20 mg/dL (ref 8–27)
CHLORIDE: 98 mmol/L (ref 97–108)
CO2: 24 mmol/L (ref 18–29)
Calcium: 9.6 mg/dL (ref 8.7–10.3)
Creatinine, Ser: 1.07 mg/dL — ABNORMAL HIGH (ref 0.57–1.00)
GFR calc Af Amer: 56 mL/min/{1.73_m2} — ABNORMAL LOW (ref >59)
GFR calc non Af Amer: 49 mL/min/{1.73_m2} — ABNORMAL LOW (ref >59)
Glucose: 95 mg/dL (ref 65–99)
Potassium: 4.3 mmol/L (ref 3.5–5.2)
SODIUM: 136 mmol/L (ref 134–144)

## 2014-03-22 NOTE — Telephone Encounter (Signed)
Patient wants to know about ? Referral to orthopedic dr instead of rheumatologist and what about doppler study

## 2014-03-22 NOTE — Telephone Encounter (Signed)
Patient aware.

## 2014-03-22 NOTE — Telephone Encounter (Signed)
i think rheumatologist is better because can do more with her pain- referral made for doppler study

## 2014-03-28 ENCOUNTER — Telehealth: Payer: Self-pay | Admitting: Nurse Practitioner

## 2014-04-22 ENCOUNTER — Other Ambulatory Visit: Payer: Self-pay | Admitting: Internal Medicine

## 2014-04-30 ENCOUNTER — Ambulatory Visit
Admission: RE | Admit: 2014-04-30 | Discharge: 2014-04-30 | Disposition: A | Discharge status: Home / Self Care | Payer: Medicare Other | Source: Ambulatory Visit | Attending: Rheumatology | Admitting: Rheumatology

## 2014-04-30 ENCOUNTER — Ambulatory Visit: Payer: Medicare Other | Admitting: Internal Medicine

## 2014-04-30 ENCOUNTER — Other Ambulatory Visit: Payer: Self-pay | Admitting: Rheumatology

## 2014-04-30 DIAGNOSIS — M542 Cervicalgia: Secondary | ICD-10-CM | POA: Diagnosis not present

## 2014-04-30 DIAGNOSIS — M79669 Pain in unspecified lower leg: Secondary | ICD-10-CM

## 2014-04-30 DIAGNOSIS — M47812 Spondylosis without myelopathy or radiculopathy, cervical region: Secondary | ICD-10-CM | POA: Diagnosis not present

## 2014-04-30 DIAGNOSIS — M67919 Unspecified disorder of synovium and tendon, unspecified shoulder: Secondary | ICD-10-CM | POA: Diagnosis not present

## 2014-04-30 DIAGNOSIS — IMO0002 Reserved for concepts with insufficient information to code with codable children: Secondary | ICD-10-CM | POA: Diagnosis not present

## 2014-04-30 DIAGNOSIS — M79609 Pain in unspecified limb: Secondary | ICD-10-CM | POA: Diagnosis not present

## 2014-05-14 ENCOUNTER — Telehealth: Payer: Self-pay | Admitting: Nurse Practitioner

## 2014-05-14 NOTE — Telephone Encounter (Signed)
appt at 2 with mmm

## 2014-05-15 ENCOUNTER — Encounter: Payer: Self-pay | Admitting: Nurse Practitioner

## 2014-05-15 ENCOUNTER — Ambulatory Visit (INDEPENDENT_AMBULATORY_CARE_PROVIDER_SITE_OTHER): Payer: Medicare Other | Admitting: Nurse Practitioner

## 2014-05-15 ENCOUNTER — Ambulatory Visit (INDEPENDENT_AMBULATORY_CARE_PROVIDER_SITE_OTHER): Payer: Medicare Other

## 2014-05-15 VITALS — BP 113/59 | HR 59 | Temp 97.8°F | Ht 62.0 in | Wt 153.0 lb

## 2014-05-15 DIAGNOSIS — R197 Diarrhea, unspecified: Secondary | ICD-10-CM | POA: Diagnosis not present

## 2014-05-15 DIAGNOSIS — R634 Abnormal weight loss: Secondary | ICD-10-CM | POA: Diagnosis not present

## 2014-05-15 DIAGNOSIS — R079 Chest pain, unspecified: Secondary | ICD-10-CM | POA: Diagnosis not present

## 2014-05-15 NOTE — Progress Notes (Signed)
   Subjective:    Patient ID: Erica [name], female    DOB: [birth year], 80 y.o.   MRN: [id number]  HPI Patient in today c/o cough and congestion with nausea- started 6 days ago after starting on prednisone. She stopped taking prednisone and feels no different. Having some chest pain.    Review of Systems  Constitutional: Positive for appetite change (decreased). Negative for fever and chills.  HENT: Positive for congestion.   Cardiovascular: Negative.   Gastrointestinal: Positive for nausea and diarrhea.  Genitourinary: Negative.   Musculoskeletal: Negative.   Skin: Negative.   Psychiatric/Behavioral: Negative.   All other systems reviewed and are negative.      Objective:   Physical Exam  Constitutional: She is oriented to person, place, and time. She appears well-developed and well-nourished.  HENT:  Right Ear: External ear normal.  Left Ear: External ear normal.  Nose: Nose normal.  Mouth/Throat: Oropharynx is clear and moist.  Eyes: Pupils are equal, round, and reactive to light.  Neck: Normal range of motion. Neck supple.  Cardiovascular: Normal rate, regular rhythm and normal heart sounds.   Pulmonary/Chest: Effort normal and breath sounds normal.  Abdominal: Soft. Bowel sounds are normal.  Lymphadenopathy:    She has no cervical adenopathy.  Neurological: She is alert and oriented to person, place, and time.  Skin: Skin is warm and dry.  Psychiatric: She has a normal mood and affect. Her behavior is normal. Judgment and thought content normal.   BP 113/59  Pulse 59  Temp(Src) 97.8 F (36.6 C) (Oral)  Ht $R'5\' 2"'fb$  (1.575 m)  Wt 153 lb (69.4 kg)  BMI 27.98 kg/m2  EKG- sinus bradycardia- slight ST elevation in v3 and V4- sent to cardiologist for over read Chest x ray- clear-Preliminary reading by Ronnald Collum, [name]  [facility]- moderate stool burden-Preliminary reading by Ronnald Collum, [name]  [facility]       Assessment & Plan:   1. Diarrhea   2. Chest pain   3. Loss of  weight    Orders Placed This Encounter  Procedures  . DG Abd 1 View    Standing Status: Future     Number of Occurrences: 1     Standing Expiration Date: 07/15/2015    Order Specific Question:  Reason for Exam (SYMPTOM  OR DIAGNOSIS REQUIRED)    Answer:  diarrhea    Order Specific Question:  Preferred imaging location?    Answer:  [facility]  . DG Chest 2 View    Standing Status: Future     Number of Occurrences: 1     Standing Expiration Date: 07/15/2015    Order Specific Question:  Reason for Exam (SYMPTOM  OR DIAGNOSIS REQUIRED)    Answer:  weight loss    Order Specific Question:  Preferred imaging location?    Answer:  [facility]  . BMP8+EGFR  . Thyroid Panel With TSH  . EKG 12-Lead   Patient told to make follow up with cardiologist Labs ending Force fluids Follow up if no improvement  Mary-Margaret Hassell Done, [name]

## 2014-05-16 DIAGNOSIS — E039 Hypothyroidism, unspecified: Secondary | ICD-10-CM | POA: Diagnosis not present

## 2014-05-16 DIAGNOSIS — K219 Gastro-esophageal reflux disease without esophagitis: Secondary | ICD-10-CM | POA: Diagnosis not present

## 2014-05-16 DIAGNOSIS — E785 Hyperlipidemia, unspecified: Secondary | ICD-10-CM | POA: Diagnosis not present

## 2014-05-16 DIAGNOSIS — I10 Essential (primary) hypertension: Secondary | ICD-10-CM | POA: Diagnosis not present

## 2014-05-16 DIAGNOSIS — I2510 Atherosclerotic heart disease of native coronary artery without angina pectoris: Secondary | ICD-10-CM | POA: Diagnosis not present

## 2014-05-16 LAB — BMP8+EGFR
BUN/Creatinine Ratio: 14 (ref 11–26)
BUN: 15 mg/dL (ref 8–27)
CALCIUM: 9.9 mg/dL (ref 8.7–10.3)
CO2: 21 mmol/L (ref 18–29)
Chloride: 101 mmol/L (ref 97–108)
Creatinine, Ser: 1.04 mg/dL — ABNORMAL HIGH (ref 0.57–1.00)
GFR calc Af Amer: 58 mL/min/{1.73_m2} — ABNORMAL LOW (ref >59)
GFR calc non Af Amer: 51 mL/min/{1.73_m2} — ABNORMAL LOW (ref >59)
GLUCOSE: 103 mg/dL — AB (ref 65–99)
POTASSIUM: 5.4 mmol/L — AB (ref 3.5–5.2)
Sodium: 138 mmol/L (ref 134–144)

## 2014-05-16 LAB — NMR, LIPOPROFILE
CHOLESTEROL: 202 mg/dL — AB (ref 100–199)
HDL Cholesterol by NMR: 46 mg/dL (ref >39)
HDL PARTICLE NUMBER: 34.9 umol/L (ref >=30.5)
LDL Particle Number: 1634 nmol/L — ABNORMAL HIGH (ref <1000)
LDL Size: 20.6 nm (ref >20.5)
LDLC SERPL CALC-MCNC: 112 mg/dL — ABNORMAL HIGH (ref 0–99)
LP-IR Score: 75 — ABNORMAL HIGH (ref <=45)
Small LDL Particle Number: 848 nmol/L — ABNORMAL HIGH (ref <=527)
TRIGLYCERIDES BY NMR: 219 mg/dL — AB (ref 0–149)

## 2014-05-16 LAB — THYROID PANEL WITH TSH
Free Thyroxine Index: 3.5 (ref 1.2–4.9)
T3 Uptake Ratio: 36 % (ref 24–39)
T4, Total: 9.6 ug/dL (ref 4.5–12.0)
TSH: 5.43 u[IU]/mL — ABNORMAL HIGH (ref 0.450–4.500)

## 2014-05-16 LAB — HEPATIC FUNCTION PANEL
ALT: 27 IU/L (ref 0–32)
AST: 22 IU/L (ref 0–40)
Albumin: 4.4 g/dL (ref 3.5–4.7)
Alkaline Phosphatase: 77 IU/L (ref 39–117)
BILIRUBIN TOTAL: 0.6 mg/dL (ref 0.0–1.2)
Bilirubin, Direct: 0.14 mg/dL (ref 0.00–0.40)
Total Protein: 6.5 g/dL (ref 6.0–8.5)

## 2014-05-17 ENCOUNTER — Other Ambulatory Visit: Payer: Self-pay | Admitting: Nurse Practitioner

## 2014-05-17 ENCOUNTER — Other Ambulatory Visit (INDEPENDENT_AMBULATORY_CARE_PROVIDER_SITE_OTHER): Payer: Medicare Other

## 2014-05-17 DIAGNOSIS — R7989 Other specified abnormal findings of blood chemistry: Secondary | ICD-10-CM | POA: Diagnosis not present

## 2014-05-17 NOTE — Progress Notes (Signed)
Pt came in for labs only 

## 2014-05-18 LAB — BMP8+EGFR
BUN/Creatinine Ratio: 16 (ref 11–26)
BUN: 17 mg/dL (ref 8–27)
CALCIUM: 9.2 mg/dL (ref 8.7–10.3)
CO2: 24 mmol/L (ref 18–29)
Chloride: 102 mmol/L (ref 97–108)
Creatinine, Ser: 1.05 mg/dL — ABNORMAL HIGH (ref 0.57–1.00)
GFR calc Af Amer: 58 mL/min/{1.73_m2} — ABNORMAL LOW (ref >59)
GFR calc non Af Amer: 50 mL/min/{1.73_m2} — ABNORMAL LOW (ref >59)
Glucose: 84 mg/dL (ref 65–99)
Potassium: 5.1 mmol/L (ref 3.5–5.2)
SODIUM: 138 mmol/L (ref 134–144)

## 2014-05-23 ENCOUNTER — Ambulatory Visit (INDEPENDENT_AMBULATORY_CARE_PROVIDER_SITE_OTHER): Payer: Medicare Other | Admitting: Internal Medicine

## 2014-05-23 ENCOUNTER — Encounter: Payer: Self-pay | Admitting: Internal Medicine

## 2014-05-23 VITALS — BP 110/60 | HR 60 | Ht 62.0 in | Wt 156.0 lb

## 2014-05-23 DIAGNOSIS — K3184 Gastroparesis: Secondary | ICD-10-CM

## 2014-05-23 DIAGNOSIS — Z86010 Personal history of colonic polyps: Secondary | ICD-10-CM | POA: Diagnosis not present

## 2014-05-23 MED ORDER — NA SULFATE-K SULFATE-MG SULF 17.5-3.13-1.6 GM/177ML PO SOLN: ORAL | Status: DC

## 2014-05-23 MED ORDER — BETHANECHOL CHLORIDE 10 MG PO TABS: 10.0000 mg | ORAL_TABLET | Freq: Every day | ORAL | Status: DC

## 2014-05-23 NOTE — Progress Notes (Signed)
         Subjective:    Patient ID: Erica [name], female    DOB: 1930-08-09, 80 y.o.   MRN: [id number]  HPI Here re: colonoscopy f/u  Still having regurgitation  At night and very bothersome. Wants to know why - Ire-explained. Worried about it Gets pressure in her chest and has to vomit.  Medications, allergies, past medical history, past surgical history, family history and social history are reviewed and updated in the EMR.   Review of Systems As above    Objective:   Physical Exam General:  NAD Eyes:   anicteric Lungs:  clear Heart:  S1S2 no rubs, murmurs or gallops Abdomen:  soft and nontender, BS+ no splash Ext:   no edema      Assessment & Plan:  History of colon polyps Repeat colon - spry for her age The risks and benefits as well as alternatives of endoscopic procedure(s) have been discussed and reviewed. All questions answered. The patient agrees to proceed.   Gastroparesis Trial of bethanechol 10 mg hs - side effects discussed Recheck EGD The risks and benefits as well as alternatives of endoscopic procedure(s) have been discussed and reviewed. All questions answered. The patient agrees to proceed.

## 2014-05-23 NOTE — Assessment & Plan Note (Signed)
Repeat colon - spry for her age

## 2014-05-23 NOTE — Patient Instructions (Addendum)
You have been scheduled for an endoscopy and colonoscopy with propofol. Please follow the written instructions given to you at your visit today. Please pick up your prep at the pharmacy within the next 1-3 days. If you use inhalers (even only as needed), please bring them with you on the day of your procedure. Your physician has requested that you go to www.startemmi.com and enter the access code given to you at your visit today. This web site gives a general overview about your procedure. However, you should still follow specific instructions given to you by our office regarding your preparation for the procedure.  We have sent the following medications to your pharmacy for you to pick up at your convenience: Urecholine  I appreciate the opportunity to care for you.

## 2014-05-23 NOTE — Assessment & Plan Note (Signed)
Trial of bethanechol Recheck EGD

## 2014-05-26 ENCOUNTER — Other Ambulatory Visit: Payer: Self-pay | Admitting: Nurse Practitioner

## 2014-05-26 DIAGNOSIS — S1093XA Contusion of unspecified part of neck, initial encounter: Secondary | ICD-10-CM | POA: Diagnosis not present

## 2014-05-26 DIAGNOSIS — I10 Essential (primary) hypertension: Secondary | ICD-10-CM | POA: Diagnosis not present

## 2014-05-26 DIAGNOSIS — M542 Cervicalgia: Secondary | ICD-10-CM | POA: Diagnosis not present

## 2014-05-26 DIAGNOSIS — S4980XA Other specified injuries of shoulder and upper arm, unspecified arm, initial encounter: Secondary | ICD-10-CM | POA: Diagnosis not present

## 2014-05-26 DIAGNOSIS — S0990XA Unspecified injury of head, initial encounter: Secondary | ICD-10-CM | POA: Diagnosis not present

## 2014-05-26 DIAGNOSIS — R51 Headache: Secondary | ICD-10-CM | POA: Diagnosis not present

## 2014-05-26 DIAGNOSIS — S40019A Contusion of unspecified shoulder, initial encounter: Secondary | ICD-10-CM | POA: Diagnosis not present

## 2014-05-26 DIAGNOSIS — W19XXXA Unspecified fall, initial encounter: Secondary | ICD-10-CM | POA: Diagnosis not present

## 2014-05-26 DIAGNOSIS — IMO0002 Reserved for concepts with insufficient information to code with codable children: Secondary | ICD-10-CM | POA: Diagnosis not present

## 2014-05-26 DIAGNOSIS — S46909A Unspecified injury of unspecified muscle, fascia and tendon at shoulder and upper arm level, unspecified arm, initial encounter: Secondary | ICD-10-CM | POA: Diagnosis not present

## 2014-05-26 DIAGNOSIS — S0993XA Unspecified injury of face, initial encounter: Secondary | ICD-10-CM | POA: Diagnosis not present

## 2014-05-26 DIAGNOSIS — S0003XA Contusion of scalp, initial encounter: Secondary | ICD-10-CM | POA: Diagnosis not present

## 2014-05-26 DIAGNOSIS — K219 Gastro-esophageal reflux disease without esophagitis: Secondary | ICD-10-CM | POA: Diagnosis not present

## 2014-05-26 DIAGNOSIS — R209 Unspecified disturbances of skin sensation: Secondary | ICD-10-CM | POA: Diagnosis not present

## 2014-05-26 DIAGNOSIS — S199XXA Unspecified injury of neck, initial encounter: Secondary | ICD-10-CM | POA: Diagnosis not present

## 2014-05-26 DIAGNOSIS — S0120XA Unspecified open wound of nose, initial encounter: Secondary | ICD-10-CM | POA: Diagnosis not present

## 2014-05-26 DIAGNOSIS — M25519 Pain in unspecified shoulder: Secondary | ICD-10-CM | POA: Diagnosis not present

## 2014-06-06 DIAGNOSIS — IMO0002 Reserved for concepts with insufficient information to code with codable children: Secondary | ICD-10-CM | POA: Diagnosis not present

## 2014-06-06 DIAGNOSIS — M545 Low back pain, unspecified: Secondary | ICD-10-CM | POA: Diagnosis not present

## 2014-06-06 DIAGNOSIS — Z79899 Other long term (current) drug therapy: Secondary | ICD-10-CM | POA: Diagnosis not present

## 2014-06-06 DIAGNOSIS — M542 Cervicalgia: Secondary | ICD-10-CM | POA: Diagnosis not present

## 2014-06-17 DIAGNOSIS — N390 Urinary tract infection, site not specified: Secondary | ICD-10-CM | POA: Diagnosis not present

## 2014-06-17 DIAGNOSIS — L940 Localized scleroderma [morphea]: Secondary | ICD-10-CM | POA: Diagnosis not present

## 2014-06-27 ENCOUNTER — Other Ambulatory Visit: Payer: Self-pay | Admitting: Internal Medicine

## 2014-07-01 ENCOUNTER — Encounter: Payer: Self-pay | Admitting: Internal Medicine

## 2014-07-01 ENCOUNTER — Telehealth: Payer: Self-pay | Admitting: Internal Medicine

## 2014-07-01 ENCOUNTER — Ambulatory Visit (AMBULATORY_SURGERY_CENTER): Payer: Medicare Other | Admitting: Internal Medicine

## 2014-07-01 VITALS — BP 150/67 | HR 67 | Temp 96.9°F | Resp 67 | Ht 62.0 in | Wt 156.0 lb

## 2014-07-01 DIAGNOSIS — F959 Tic disorder, unspecified: Secondary | ICD-10-CM | POA: Diagnosis not present

## 2014-07-01 DIAGNOSIS — K3184 Gastroparesis: Secondary | ICD-10-CM

## 2014-07-01 DIAGNOSIS — M549 Dorsalgia, unspecified: Secondary | ICD-10-CM | POA: Diagnosis not present

## 2014-07-01 DIAGNOSIS — I059 Rheumatic mitral valve disease, unspecified: Secondary | ICD-10-CM | POA: Diagnosis not present

## 2014-07-01 DIAGNOSIS — D126 Benign neoplasm of colon, unspecified: Secondary | ICD-10-CM

## 2014-07-01 DIAGNOSIS — Z86010 Personal history of colonic polyps: Secondary | ICD-10-CM

## 2014-07-01 DIAGNOSIS — F329 Major depressive disorder, single episode, unspecified: Secondary | ICD-10-CM | POA: Diagnosis not present

## 2014-07-01 DIAGNOSIS — I10 Essential (primary) hypertension: Secondary | ICD-10-CM | POA: Diagnosis not present

## 2014-07-01 DIAGNOSIS — F3289 Other specified depressive episodes: Secondary | ICD-10-CM | POA: Diagnosis not present

## 2014-07-01 DIAGNOSIS — K5730 Diverticulosis of large intestine without perforation or abscess without bleeding: Secondary | ICD-10-CM | POA: Diagnosis not present

## 2014-07-01 DIAGNOSIS — E039 Hypothyroidism, unspecified: Secondary | ICD-10-CM | POA: Diagnosis not present

## 2014-07-01 MED ORDER — SODIUM CHLORIDE 0.9 % IV SOLN: 500.0000 mL | INTRAVENOUS | Status: DC

## 2014-07-01 MED ORDER — BETHANECHOL CHLORIDE 10 MG PO TABS: 20.0000 mg | ORAL_TABLET | Freq: Every day | ORAL | Status: DC

## 2014-07-01 NOTE — Progress Notes (Signed)
Report to PACU, RN, vss, BBS= Clear.  

## 2014-07-01 NOTE — Op Note (Signed)
[place]  [address] [place], [id number]   COLONOSCOPY PROCEDURE REPORT  PATIENT: Erica [name], Erica [name]  MR#: [id number] BIRTHDATE: Jul 09, 1930 , 83  yrs. old GENDER: Female ENDOSCOPIST: Gatha Mayer, MD, [facility] PROCEDURE DATE:  07/01/2014 PROCEDURE:   Colonoscopy with snare polypectomy First Screening Colonoscopy - Avg.  risk and is 50 yrs.  old or older - No.  Prior Negative Screening - Now for repeat screening. N/A  History of Adenoma - Now for follow-up colonoscopy & has been > or = to 3 yrs.  Yes hx of adenoma.  Has been 3 or more years since last colonoscopy.  Polyps Removed Today? Yes. ASA CLASS:   Class III INDICATIONS:Patient's personal history of adenomatous colon polyps.  MEDICATIONS: Propofol (Diprivan) 210 mg IV  DESCRIPTION OF PROCEDURE:   After the risks benefits and alternatives of the procedure were thoroughly explained, informed consent was obtained.  A digital rectal exam revealed decreased sphincter tone. Anal skin was erythematous.  The LB 1528  endoscope was introduced through the anus and advanced to the cecum, which was identified by both the appendix and ileocecal valve. No adverse events experienced.   The quality of the prep was Suprep good  The instrument was then slowly withdrawn as the colon was fully examined.  COLON FINDINGS: Three sessile polyps measuring 5-7 mm in size were found in the ascending colon.  A polypectomy was performed with a cold snare.  The resection was complete and the polyp tissue was completely retrieved.   Severe diverticulosis was noted in the sigmoid colon.   The colon mucosa was otherwise normal. Retroflexed views revealed no abnormalities. The time to cecum=5 minutes 31 seconds.  Withdrawal time=13 minutes 57 seconds.  The scope was withdrawn and the procedure completed. COMPLICATIONS: There were no complications.  ENDOSCOPIC IMPRESSION: 1.   Three sessile polyps measuring 5-7 mm in size were found in  the ascending colon; polypectomy was performed with a cold snare 2.   Severe diverticulosis was noted in the sigmoid colon 3.   The colon mucosa was otherwise normal - good prep - hx adenomas over the years  RECOMMENDATIONS: Seems like she will only need colonoscopy as needed at 30 and with these findings.  Await pathology.   eSigned:  Gatha Mayer, MD, [facility] 07/01/2014 2:13 PM   cc: The Patient

## 2014-07-01 NOTE — Telephone Encounter (Signed)
Returned patient's call and she was not able to keep 2nd bottle of prep down this morning.  She states that her stools are still a little cloudy at this time but no solid pieces.  I advised her to come on in at 12:30 as scheduled and if her stools are still cloudy, we will ask Dr. Carlean Purl if he wants her to have an enema.  She agreed and all questions were answered.

## 2014-07-01 NOTE — Progress Notes (Signed)
Pt says she threw up last part of prep and that last bm was cloudy, therefore fleets enema x1 given by pt.

## 2014-07-01 NOTE — Patient Instructions (Addendum)
The esophagus, stomach and duodenum are ok. Try taking 2 of the bethanechol medicines (10 mg x 2) at bedtime to see if that helps.  I found and removed 3 small colon polyps - they do not look like cancer. You still have diverticulosis.  Please call this week and make an appointment to see me in August or September. Call back sooner for advice as needed.  I appreciate the opportunity to care for you. Gatha Mayer, MD, FACG   YOU HAD AN ENDOSCOPIC PROCEDURE TODAY AT [facility]: Refer to the procedure report that was given to you for any specific questions about what was found during the examination.  If the procedure report does not answer your questions, please call your gastroenterologist to clarify.  If you requested that your care partner not be given the details of your procedure findings, then the procedure report has been included in a sealed envelope for you to review at your convenience later.  YOU SHOULD EXPECT: Some feelings of bloating in the abdomen. Passage of more gas than usual.  Walking can help get rid of the air that was put into your GI tract during the procedure and reduce the bloating. If you had a lower endoscopy (such as a colonoscopy or flexible sigmoidoscopy) you may notice spotting of blood in your stool or on the toilet paper. If you underwent a bowel prep for your procedure, then you may not have a normal bowel movement for a few days.  DIET: Your first meal following the procedure should be a light meal and then it is ok to progress to your normal diet.  A half-sandwich or bowl of soup is an example of a good first meal.  Heavy or fried foods are harder to digest and may make you feel nauseous or bloated.  Likewise meals heavy in dairy and vegetables can cause extra gas to form and this can also increase the bloating.  Drink plenty of fluids but you should avoid alcoholic beverages for 24 hours.  ACTIVITY: Your care partner should take you home  directly after the procedure.  You should plan to take it easy, moving slowly for the rest of the day.  You can resume normal activity the day after the procedure however you should NOT DRIVE or use heavy machinery for 24 hours (because of the sedation medicines used during the test).    SYMPTOMS TO REPORT IMMEDIATELY: A gastroenterologist can be reached at any hour.  During normal business hours, 8:30 AM to 5:00 PM Monday through Friday, call [phone].  After hours and on weekends, please call the GI answering service at [phone] who will take a message and have the physician on call contact you.   Following lower endoscopy (colonoscopy or flexible sigmoidoscopy):  Excessive amounts of blood in the stool  Significant tenderness or worsening of abdominal pains  Swelling of the abdomen that is new, acute  Fever of 100F or higher  Following upper endoscopy (EGD)  Vomiting of blood or coffee ground material  New chest pain or pain under the shoulder blades  Painful or persistently difficult swallowing  New shortness of breath  Fever of 100F or higher  Black, tarry-looking stools  FOLLOW UP: If any biopsies were taken you will be contacted by phone or by letter within the next 1-3 weeks.  Call your gastroenterologist if you have not heard about the biopsies in 3 weeks.  Our staff will call the home number listed on  your records the next business day following your procedure to check on you and address any questions or concerns that you may have at that time regarding the information given to you following your procedure. This is a courtesy call and so if there is no answer at the home number and we have not heard from you through the emergency physician on call, we will assume that you have returned to your regular daily activities without incident.  SIGNATURES/CONFIDENTIALITY: You and/or your care partner have signed paperwork which will be entered into your electronic medical  record.  These signatures attest to the fact that that the information above on your After Visit Summary has been reviewed and is understood.  Full responsibility of the confidentiality of this discharge information lies with you and/or your care-partner.  Please, read the handouts given to you by your recovery room nurse.

## 2014-07-01 NOTE — Progress Notes (Signed)
Called to room to assist during endoscopic procedure.  Patient ID and intended procedure confirmed with present staff. Received instructions for my participation in the procedure from the performing physician.  

## 2014-07-01 NOTE — Op Note (Signed)
[place]  [address]. [place], [address]   ENDOSCOPY PROCEDURE REPORT  PATIENT: Erica, [name]  MR#: [id number] BIRTHDATE: 18-Jul-1930 , 83  yrs. old GENDER: Female ENDOSCOPIST: Gatha Mayer, MD, [facility] PROCEDURE DATE:  07/01/2014 PROCEDURE:  EGD, diagnostic ASA CLASS:     Class III INDICATIONS:  gastroparesis. MEDICATIONS: Propofol (Diprivan) 130 mg IV, MAC sedation, administered by CRNA, and These medications were titrated to patient response per physician's verbal order TOPICAL ANESTHETIC: none  DESCRIPTION OF PROCEDURE: After the risks benefits and alternatives of the procedure were thoroughly explained, informed consent was obtained.  The LB SEL-[id number] [id number] endoscope was introduced through the mouth and advanced to the second portion of the duodenum. Without limitations.  The instrument was slowly withdrawn as the mucosa was fully examined.      The upper, middle and distal third of the esophagus were carefully inspected and no abnormalities were noted.  The z-line was well seen at the GEJ.  The endoscope was pushed into the fundus which was normal including a retroflexed view.  The antrum, gastric body, first and second part of the duodenum were unremarkable. Retroflexed views revealed no abnormalities.     The scope was then withdrawn from the patient and the procedure completed.  COMPLICATIONS: There were no complications. ENDOSCOPIC IMPRESSION: Normal EGD  RECOMMENDATIONS: Increase bethanechol to 20 mg qhs see me in Aug/Sept If still not better consider higher doses of bethanechol vs evaluation of esophageal motility    eSigned:  Gatha Mayer, MD, [facility] 07/01/2014 2:08 PM   CC:The Patient

## 2014-07-02 ENCOUNTER — Telehealth: Payer: Self-pay | Admitting: *Deleted

## 2014-07-02 NOTE — Telephone Encounter (Signed)
Left message that we called for f/u 

## 2014-07-07 ENCOUNTER — Encounter: Payer: Self-pay | Admitting: Internal Medicine

## 2014-07-22 ENCOUNTER — Other Ambulatory Visit: Payer: Self-pay | Admitting: Internal Medicine

## 2014-07-22 NOTE — Telephone Encounter (Signed)
Per pt Dr.Gessner increased this to one twice a day.  Will confirm with him before sending in refill.

## 2014-07-22 NOTE — Telephone Encounter (Signed)
I don't see where I did that and less I forgot to documented. If she needs a twice a day, it's okay to do that was before breakfast and once at bedtime. #60 with 5 refills.

## 2014-07-24 DIAGNOSIS — IMO0002 Reserved for concepts with insufficient information to code with codable children: Secondary | ICD-10-CM | POA: Diagnosis not present

## 2014-07-24 DIAGNOSIS — M79609 Pain in unspecified limb: Secondary | ICD-10-CM | POA: Diagnosis not present

## 2014-07-24 DIAGNOSIS — M542 Cervicalgia: Secondary | ICD-10-CM | POA: Diagnosis not present

## 2014-07-25 ENCOUNTER — Other Ambulatory Visit: Payer: Self-pay | Admitting: Rheumatology

## 2014-07-25 DIAGNOSIS — M542 Cervicalgia: Secondary | ICD-10-CM

## 2014-07-26 ENCOUNTER — Other Ambulatory Visit: Payer: Self-pay | Admitting: Internal Medicine

## 2014-07-29 ENCOUNTER — Ambulatory Visit
Admission: RE | Admit: 2014-07-29 | Discharge: 2014-07-29 | Disposition: A | Discharge status: Home / Self Care | Payer: Medicare Other | Source: Ambulatory Visit | Attending: Rheumatology | Admitting: Rheumatology

## 2014-07-29 DIAGNOSIS — M542 Cervicalgia: Secondary | ICD-10-CM

## 2014-07-29 DIAGNOSIS — M5030 Other cervical disc degeneration, unspecified cervical region: Secondary | ICD-10-CM | POA: Diagnosis not present

## 2014-07-29 DIAGNOSIS — M47812 Spondylosis without myelopathy or radiculopathy, cervical region: Secondary | ICD-10-CM | POA: Diagnosis not present

## 2014-07-29 DIAGNOSIS — M4802 Spinal stenosis, cervical region: Secondary | ICD-10-CM | POA: Diagnosis not present

## 2014-08-02 DIAGNOSIS — H0230 Blepharochalasis unspecified eye, unspecified eyelid: Secondary | ICD-10-CM | POA: Diagnosis not present

## 2014-08-02 DIAGNOSIS — H02209 Unspecified lagophthalmos unspecified eye, unspecified eyelid: Secondary | ICD-10-CM | POA: Diagnosis not present

## 2014-08-02 DIAGNOSIS — Z961 Presence of intraocular lens: Secondary | ICD-10-CM | POA: Diagnosis not present

## 2014-08-02 DIAGNOSIS — H524 Presbyopia: Secondary | ICD-10-CM | POA: Diagnosis not present

## 2014-08-15 DIAGNOSIS — R0789 Other chest pain: Secondary | ICD-10-CM | POA: Diagnosis not present

## 2014-08-15 DIAGNOSIS — M159 Polyosteoarthritis, unspecified: Secondary | ICD-10-CM | POA: Diagnosis not present

## 2014-08-15 DIAGNOSIS — I10 Essential (primary) hypertension: Secondary | ICD-10-CM | POA: Diagnosis not present

## 2014-08-15 DIAGNOSIS — E039 Hypothyroidism, unspecified: Secondary | ICD-10-CM | POA: Diagnosis not present

## 2014-08-15 DIAGNOSIS — E785 Hyperlipidemia, unspecified: Secondary | ICD-10-CM | POA: Diagnosis not present

## 2014-08-15 DIAGNOSIS — I2510 Atherosclerotic heart disease of native coronary artery without angina pectoris: Secondary | ICD-10-CM | POA: Diagnosis not present

## 2014-08-15 DIAGNOSIS — K219 Gastro-esophageal reflux disease without esophagitis: Secondary | ICD-10-CM | POA: Diagnosis not present

## 2014-08-26 ENCOUNTER — Telehealth: Payer: Self-pay

## 2014-08-26 MED ORDER — BETHANECHOL CHLORIDE 10 MG PO TABS: 20.0000 mg | ORAL_TABLET | Freq: Every day | ORAL | Status: DC

## 2014-08-26 NOTE — Telephone Encounter (Signed)
Got fax from pharmacy requesting refill on bethanechol 10mg , please advise Sir, thank you.

## 2014-08-26 NOTE — Telephone Encounter (Signed)
Made patient aware that her dose had been changed and reminded her of upcoming appointment.

## 2014-08-26 NOTE — Telephone Encounter (Signed)
Refill at 20 mg qhs # 1 month supply RF x 3 She is supposed to be seeing me in the next 1-2 months

## 2014-08-27 ENCOUNTER — Other Ambulatory Visit: Payer: Self-pay | Admitting: *Deleted

## 2014-08-27 MED ORDER — ESCITALOPRAM OXALATE 20 MG PO TABS: ORAL_TABLET | ORAL | Status: DC

## 2014-08-27 NOTE — Telephone Encounter (Signed)
Last seen in office on 05-15-14. Please advise on 90 day refill

## 2014-08-29 ENCOUNTER — Ambulatory Visit (INDEPENDENT_AMBULATORY_CARE_PROVIDER_SITE_OTHER): Payer: Medicare Other | Admitting: Nurse Practitioner

## 2014-08-29 ENCOUNTER — Encounter: Payer: Self-pay | Admitting: Nurse Practitioner

## 2014-08-29 VITALS — BP 129/65 | HR 62 | Temp 97.2°F | Ht 62.0 in | Wt 156.6 lb

## 2014-08-29 DIAGNOSIS — E7400 Glycogen storage disease, unspecified: Secondary | ICD-10-CM

## 2014-08-29 DIAGNOSIS — I10 Essential (primary) hypertension: Secondary | ICD-10-CM | POA: Diagnosis not present

## 2014-08-29 DIAGNOSIS — I25810 Atherosclerosis of coronary artery bypass graft(s) without angina pectoris: Secondary | ICD-10-CM | POA: Diagnosis not present

## 2014-08-29 DIAGNOSIS — B880 Other acariasis: Secondary | ICD-10-CM | POA: Diagnosis not present

## 2014-08-29 DIAGNOSIS — E039 Hypothyroidism, unspecified: Secondary | ICD-10-CM | POA: Diagnosis not present

## 2014-08-29 MED ORDER — METHYLPREDNISOLONE ACETATE 80 MG/ML IJ SUSP
80.0000 mg | Freq: Once | INTRAMUSCULAR | Status: AC
  Administered 2014-08-29: 80 mg via INTRAMUSCULAR

## 2014-08-29 NOTE — Addendum Note (Signed)
Addended by: Pollyann Kennedy F on: 08/29/2014 05:27 PM   Modules accepted: Orders

## 2014-08-29 NOTE — Progress Notes (Signed)
   Subjective:    Patient ID: CHIANTE PEDEN, female    DOB: 1930/03/27, 80 y.o.   MRN: [id number]  HPI Patient has itchy rash on legs- started after she went to blueberry patch- has pumps all over her.    Review of Systems  Constitutional: Negative.   HENT: Negative.   Respiratory: Negative.   Cardiovascular: Negative.   Genitourinary: Negative.   Neurological: Negative.   Psychiatric/Behavioral: Negative.   All other systems reviewed and are negative.      Objective:   Physical Exam  Constitutional: She appears well-developed and well-nourished.  Cardiovascular: Normal rate and normal heart sounds.   Pulmonary/Chest: Effort normal and breath sounds normal.  Skin:  Erythematous raised bites all over  Psychiatric: She has a normal mood and affect. Her behavior is normal. Judgment and thought content normal.   BP 129/65  Pulse 62  Temp(Src) 97.2 F (36.2 C) (Oral)  Ht 5\' 2"  (1.575 m)  Wt 156 lb 9.6 oz (71.033 kg)  BMI 28.64 kg/m2        Assessment & Plan:   1. Chigger bites   Benadryl OTC for itching Calamine lotion Avoid scratching RTO prn   Mary-Margaret Hassell Done, [name]

## 2014-08-29 NOTE — Patient Instructions (Signed)
1 cup ammonia 1 tsp baking soda 1 tsp meat tenderizer *Mix together and dab on bites  

## 2014-08-29 NOTE — Addendum Note (Signed)
Addended by: [name] on: 08/29/2014 05:07 PM   Modules accepted: Orders

## 2014-08-30 ENCOUNTER — Ambulatory Visit (INDEPENDENT_AMBULATORY_CARE_PROVIDER_SITE_OTHER): Payer: Medicare Other | Admitting: Internal Medicine

## 2014-08-30 ENCOUNTER — Encounter: Payer: Self-pay | Admitting: Internal Medicine

## 2014-08-30 VITALS — BP 134/64 | HR 60 | Ht 61.5 in | Wt 156.2 lb

## 2014-08-30 DIAGNOSIS — K3184 Gastroparesis: Secondary | ICD-10-CM | POA: Diagnosis not present

## 2014-08-30 DIAGNOSIS — I25810 Atherosclerosis of coronary artery bypass graft(s) without angina pectoris: Secondary | ICD-10-CM | POA: Diagnosis not present

## 2014-08-30 LAB — BMP8+EGFR
BUN / CREAT RATIO: 17 (ref 11–26)
BUN: 20 mg/dL (ref 8–27)
CO2: 24 mmol/L (ref 18–29)
Calcium: 9.7 mg/dL (ref 8.7–10.3)
Chloride: 96 mmol/L — ABNORMAL LOW (ref 97–108)
Creatinine, Ser: 1.16 mg/dL — ABNORMAL HIGH (ref 0.57–1.00)
GFR calc non Af Amer: 44 mL/min/{1.73_m2} — ABNORMAL LOW (ref >59)
GFR, EST AFRICAN AMERICAN: 51 mL/min/{1.73_m2} — AB (ref >59)
Glucose: 106 mg/dL — ABNORMAL HIGH (ref 65–99)
POTASSIUM: 4.6 mmol/L (ref 3.5–5.2)
SODIUM: 134 mmol/L (ref 134–144)

## 2014-08-30 LAB — HEPATIC FUNCTION PANEL
ALK PHOS: 59 IU/L (ref 39–117)
ALT: 16 IU/L (ref 0–32)
AST: 19 IU/L (ref 0–40)
Albumin: 3.9 g/dL (ref 3.5–4.7)
BILIRUBIN DIRECT: 0.08 mg/dL (ref 0.00–0.40)
Total Bilirubin: 0.3 mg/dL (ref 0.0–1.2)
Total Protein: 5.9 g/dL — ABNORMAL LOW (ref 6.0–8.5)

## 2014-08-30 LAB — LIPID PANEL
Chol/HDL Ratio: 4.7 ratio units — ABNORMAL HIGH (ref 0.0–4.4)
Cholesterol, Total: 193 mg/dL (ref 100–199)
HDL: 41 mg/dL (ref >39)
LDL Calculated: 117 mg/dL — ABNORMAL HIGH (ref 0–99)
Triglycerides: 177 mg/dL — ABNORMAL HIGH (ref 0–149)
VLDL Cholesterol Cal: 35 mg/dL (ref 5–40)

## 2014-08-30 LAB — TSH: TSH: 4.58 u[IU]/mL — ABNORMAL HIGH (ref 0.450–4.500)

## 2014-08-30 MED ORDER — BETHANECHOL CHLORIDE 10 MG PO TABS: ORAL_TABLET | ORAL | Status: DC

## 2014-08-30 NOTE — Assessment & Plan Note (Signed)
Attack all seems to be helping. Will continue that, since she is having some regurgitation after supper she will try taking it before supper to see if that take care of meal time, after that and during the night. She could need a dose before supper as well but we'll try this first. She will see me as needed.

## 2014-08-30 NOTE — Progress Notes (Signed)
   Subjective:    Patient ID: Erica [name], female    DOB: 09/15/1930, 80 y.o.   MRN: [id number]  HPI The patient is here for followup of GERD and nocturnal regurgitation problems. Recent upper endoscopy was negative. She is better on bethanechol, initially a 10 mg dose  didn't seem to help much to 20 mg at bedtime she is much better. However after supper before bedtime she is still having some regurgitation of food. She is planning for a family get together with her 9 children and multiple grandchildren and great-grandchildren on Labor Day. Medications, allergies, past medical history, past surgical history, family history and social history are reviewed and updated in the EMR.  Review of Systems As above doing well otherwise it seems    Objective:   Physical Exam Elderly looking younger than stated age no acute distress    Assessment & Plan:  Gastroparesis Attack all seems to be helping. Will continue that, since she is having some regurgitation after supper she will try taking it before supper to see if that take care of meal time, after that and during the night. She could need a dose before supper as well but we'll try this first. She will see me as needed.

## 2014-08-30 NOTE — Patient Instructions (Signed)
I am glad you feel better. Try taking the bethanechol before supper instead of bedtime.  See me as needed.  I appreciate the opportunity to care for you. Gatha Mayer, MD, [facility]

## 2014-09-18 ENCOUNTER — Telehealth: Payer: Self-pay | Admitting: Nurse Practitioner

## 2014-09-18 DIAGNOSIS — Z23 Encounter for immunization: Secondary | ICD-10-CM | POA: Diagnosis not present

## 2014-09-18 MED ORDER — CIPROFLOXACIN HCL 500 MG PO TABS: 500.0000 mg | ORAL_TABLET | Freq: Two times a day (BID) | ORAL | Status: DC

## 2014-09-18 NOTE — Telephone Encounter (Signed)
Patient aware.

## 2014-09-18 NOTE — Telephone Encounter (Signed)
Patient complains of dysuria and frequency that began yesterday. Symptoms are worse today than yesterday. Explained that she needs to be seen and have urine evaluated.  She doesn't have a ride to our office today.  Would you be able to prescribe an antibiotic without seeing her?

## 2014-09-18 NOTE — Telephone Encounter (Signed)
I sent the Rx to the pharmacy.

## 2014-09-25 DIAGNOSIS — M545 Low back pain, unspecified: Secondary | ICD-10-CM | POA: Diagnosis not present

## 2014-09-25 DIAGNOSIS — M67919 Unspecified disorder of synovium and tendon, unspecified shoulder: Secondary | ICD-10-CM | POA: Diagnosis not present

## 2014-09-25 DIAGNOSIS — M542 Cervicalgia: Secondary | ICD-10-CM | POA: Diagnosis not present

## 2014-09-25 DIAGNOSIS — M4800 Spinal stenosis, site unspecified: Secondary | ICD-10-CM | POA: Diagnosis not present

## 2014-10-03 DIAGNOSIS — Z1231 Encounter for screening mammogram for malignant neoplasm of breast: Secondary | ICD-10-CM | POA: Diagnosis not present

## 2014-10-07 ENCOUNTER — Other Ambulatory Visit: Payer: Self-pay | Admitting: Obstetrics and Gynecology

## 2014-10-07 DIAGNOSIS — R319 Hematuria, unspecified: Secondary | ICD-10-CM | POA: Diagnosis not present

## 2014-10-07 DIAGNOSIS — Z124 Encounter for screening for malignant neoplasm of cervix: Secondary | ICD-10-CM | POA: Diagnosis not present

## 2014-10-08 LAB — CYTOLOGY - PAP

## 2014-10-24 DIAGNOSIS — K219 Gastro-esophageal reflux disease without esophagitis: Secondary | ICD-10-CM | POA: Diagnosis not present

## 2014-10-24 DIAGNOSIS — M1990 Unspecified osteoarthritis, unspecified site: Secondary | ICD-10-CM | POA: Diagnosis not present

## 2014-10-24 DIAGNOSIS — I2510 Atherosclerotic heart disease of native coronary artery without angina pectoris: Secondary | ICD-10-CM | POA: Diagnosis not present

## 2014-10-24 DIAGNOSIS — E039 Hypothyroidism, unspecified: Secondary | ICD-10-CM | POA: Diagnosis not present

## 2014-10-24 DIAGNOSIS — I10 Essential (primary) hypertension: Secondary | ICD-10-CM | POA: Diagnosis not present

## 2014-10-24 DIAGNOSIS — R0789 Other chest pain: Secondary | ICD-10-CM | POA: Diagnosis not present

## 2014-10-24 DIAGNOSIS — E785 Hyperlipidemia, unspecified: Secondary | ICD-10-CM | POA: Diagnosis not present

## 2014-11-11 DIAGNOSIS — D239 Other benign neoplasm of skin, unspecified: Secondary | ICD-10-CM | POA: Diagnosis not present

## 2014-11-11 DIAGNOSIS — L570 Actinic keratosis: Secondary | ICD-10-CM | POA: Diagnosis not present

## 2014-11-13 ENCOUNTER — Other Ambulatory Visit: Payer: Self-pay | Admitting: Nurse Practitioner

## 2014-11-26 ENCOUNTER — Other Ambulatory Visit: Payer: Self-pay | Admitting: Nurse Practitioner

## 2014-11-27 DIAGNOSIS — M542 Cervicalgia: Secondary | ICD-10-CM | POA: Diagnosis not present

## 2014-11-27 DIAGNOSIS — M545 Low back pain: Secondary | ICD-10-CM | POA: Diagnosis not present

## 2014-11-27 DIAGNOSIS — M7532 Calcific tendinitis of left shoulder: Secondary | ICD-10-CM | POA: Diagnosis not present

## 2014-12-05 ENCOUNTER — Encounter (HOSPITAL_COMMUNITY): Payer: Self-pay | Admitting: Cardiology

## 2014-12-14 ENCOUNTER — Other Ambulatory Visit: Payer: Self-pay | Admitting: *Deleted

## 2014-12-14 ENCOUNTER — Other Ambulatory Visit: Payer: Self-pay | Admitting: Nurse Practitioner

## 2014-12-14 MED ORDER — CIPROFLOXACIN HCL 500 MG PO TABS: 500.0000 mg | ORAL_TABLET | Freq: Two times a day (BID) | ORAL | Status: DC

## 2014-12-23 ENCOUNTER — Other Ambulatory Visit: Payer: Self-pay | Admitting: Internal Medicine

## 2014-12-23 NOTE — Telephone Encounter (Signed)
Refill Sir? 

## 2014-12-24 NOTE — Telephone Encounter (Signed)
Refill x 1 year 

## 2015-01-10 ENCOUNTER — Other Ambulatory Visit: Payer: Self-pay | Admitting: Internal Medicine

## 2015-01-19 ENCOUNTER — Other Ambulatory Visit: Payer: Self-pay | Admitting: Nurse Practitioner

## 2015-01-30 DIAGNOSIS — K219 Gastro-esophageal reflux disease without esophagitis: Secondary | ICD-10-CM | POA: Diagnosis not present

## 2015-01-30 DIAGNOSIS — I2510 Atherosclerotic heart disease of native coronary artery without angina pectoris: Secondary | ICD-10-CM | POA: Diagnosis not present

## 2015-01-30 DIAGNOSIS — I10 Essential (primary) hypertension: Secondary | ICD-10-CM | POA: Diagnosis not present

## 2015-01-30 DIAGNOSIS — E039 Hypothyroidism, unspecified: Secondary | ICD-10-CM | POA: Diagnosis not present

## 2015-01-30 DIAGNOSIS — M1990 Unspecified osteoarthritis, unspecified site: Secondary | ICD-10-CM | POA: Diagnosis not present

## 2015-01-30 DIAGNOSIS — E785 Hyperlipidemia, unspecified: Secondary | ICD-10-CM | POA: Diagnosis not present

## 2015-02-12 ENCOUNTER — Other Ambulatory Visit: Payer: Self-pay | Admitting: Nurse Practitioner

## 2015-02-18 DIAGNOSIS — M25512 Pain in left shoulder: Secondary | ICD-10-CM | POA: Diagnosis not present

## 2015-03-24 ENCOUNTER — Other Ambulatory Visit: Payer: Self-pay | Admitting: Nurse Practitioner

## 2015-03-25 NOTE — Telephone Encounter (Signed)
Last seen 08/29/14 MMM  No upcoming appt

## 2015-05-01 ENCOUNTER — Other Ambulatory Visit: Payer: Self-pay | Admitting: Nurse Practitioner

## 2015-05-05 NOTE — Telephone Encounter (Signed)
Patient NTBS for follow up and lab work  

## 2015-05-05 NOTE — Telephone Encounter (Signed)
Last seen 08/29/14 MMM

## 2015-05-05 NOTE — Telephone Encounter (Signed)
Patient NTBS for follow up and lab work Erica [name], [place]

## 2015-05-06 DIAGNOSIS — K219 Gastro-esophageal reflux disease without esophagitis: Secondary | ICD-10-CM | POA: Diagnosis not present

## 2015-05-06 DIAGNOSIS — I10 Essential (primary) hypertension: Secondary | ICD-10-CM | POA: Diagnosis not present

## 2015-05-06 DIAGNOSIS — E785 Hyperlipidemia, unspecified: Secondary | ICD-10-CM | POA: Diagnosis not present

## 2015-05-06 DIAGNOSIS — I2510 Atherosclerotic heart disease of native coronary artery without angina pectoris: Secondary | ICD-10-CM | POA: Diagnosis not present

## 2015-05-06 DIAGNOSIS — M1990 Unspecified osteoarthritis, unspecified site: Secondary | ICD-10-CM | POA: Diagnosis not present

## 2015-05-06 DIAGNOSIS — E039 Hypothyroidism, unspecified: Secondary | ICD-10-CM | POA: Diagnosis not present

## 2015-05-09 ENCOUNTER — Other Ambulatory Visit: Payer: Self-pay | Admitting: Nurse Practitioner

## 2015-05-13 ENCOUNTER — Other Ambulatory Visit (INDEPENDENT_AMBULATORY_CARE_PROVIDER_SITE_OTHER): Payer: Medicare Other

## 2015-05-13 DIAGNOSIS — I10 Essential (primary) hypertension: Secondary | ICD-10-CM | POA: Diagnosis not present

## 2015-05-13 DIAGNOSIS — E785 Hyperlipidemia, unspecified: Secondary | ICD-10-CM

## 2015-05-13 DIAGNOSIS — E038 Other specified hypothyroidism: Secondary | ICD-10-CM

## 2015-05-13 NOTE — Progress Notes (Signed)
Lab only ordered by dr Terrence Dupont in epic

## 2015-05-14 LAB — HEPATIC FUNCTION PANEL
ALT: 21 IU/L (ref 0–32)
AST: 19 IU/L (ref 0–40)
Albumin: 4.2 g/dL (ref 3.5–4.7)
Alkaline Phosphatase: 64 IU/L (ref 39–117)
BILIRUBIN TOTAL: 0.4 mg/dL (ref 0.0–1.2)
Bilirubin, Direct: 0.1 mg/dL (ref 0.00–0.40)
Total Protein: 6.4 g/dL (ref 6.0–8.5)

## 2015-05-14 LAB — BMP8+EGFR
BUN/Creatinine Ratio: 12 (ref 11–26)
BUN: 13 mg/dL (ref 8–27)
CO2: 25 mmol/L (ref 18–29)
Calcium: 9.9 mg/dL (ref 8.7–10.3)
Chloride: 97 mmol/L (ref 97–108)
Creatinine, Ser: 1.07 mg/dL — ABNORMAL HIGH (ref 0.57–1.00)
GFR calc Af Amer: 56 mL/min/{1.73_m2} — ABNORMAL LOW (ref >59)
GFR, EST NON AFRICAN AMERICAN: 48 mL/min/{1.73_m2} — AB (ref >59)
GLUCOSE: 97 mg/dL (ref 65–99)
Potassium: 5.1 mmol/L (ref 3.5–5.2)
Sodium: 136 mmol/L (ref 134–144)

## 2015-05-14 LAB — LIPID PANEL
Chol/HDL Ratio: 4.4 ratio units (ref 0.0–4.4)
Cholesterol, Total: 192 mg/dL (ref 100–199)
HDL: 44 mg/dL (ref >39)
LDL Calculated: 105 mg/dL — ABNORMAL HIGH (ref 0–99)
Triglycerides: 215 mg/dL — ABNORMAL HIGH (ref 0–149)
VLDL Cholesterol Cal: 43 mg/dL — ABNORMAL HIGH (ref 5–40)

## 2015-05-14 LAB — TSH: TSH: 7.9 u[IU]/mL — ABNORMAL HIGH (ref 0.450–4.500)

## 2015-05-28 ENCOUNTER — Other Ambulatory Visit: Payer: Self-pay | Admitting: Nurse Practitioner

## 2015-06-04 ENCOUNTER — Other Ambulatory Visit: Payer: Self-pay | Admitting: Nurse Practitioner

## 2015-06-11 ENCOUNTER — Other Ambulatory Visit: Payer: Self-pay | Admitting: Nurse Practitioner

## 2015-06-12 ENCOUNTER — Other Ambulatory Visit: Payer: Self-pay | Admitting: Internal Medicine

## 2015-07-01 ENCOUNTER — Other Ambulatory Visit: Payer: Self-pay | Admitting: Nurse Practitioner

## 2015-07-01 NOTE — Telephone Encounter (Signed)
no more refills without being seen  

## 2015-07-01 NOTE — Telephone Encounter (Signed)
Last seen 08/29/14 MMM

## 2015-07-07 ENCOUNTER — Ambulatory Visit (INDEPENDENT_AMBULATORY_CARE_PROVIDER_SITE_OTHER): Payer: Medicare Other | Admitting: Nurse Practitioner

## 2015-07-07 ENCOUNTER — Encounter: Payer: Self-pay | Admitting: Nurse Practitioner

## 2015-07-07 VITALS — BP 100/57 | HR 55 | Temp 96.9°F | Ht 61.0 in | Wt 159.0 lb

## 2015-07-07 DIAGNOSIS — K3184 Gastroparesis: Secondary | ICD-10-CM

## 2015-07-07 DIAGNOSIS — E039 Hypothyroidism, unspecified: Secondary | ICD-10-CM | POA: Insufficient documentation

## 2015-07-07 DIAGNOSIS — E785 Hyperlipidemia, unspecified: Secondary | ICD-10-CM

## 2015-07-07 DIAGNOSIS — F329 Major depressive disorder, single episode, unspecified: Secondary | ICD-10-CM | POA: Diagnosis not present

## 2015-07-07 DIAGNOSIS — Z6827 Body mass index (BMI) 27.0-27.9, adult: Secondary | ICD-10-CM | POA: Insufficient documentation

## 2015-07-07 DIAGNOSIS — I10 Essential (primary) hypertension: Secondary | ICD-10-CM

## 2015-07-07 DIAGNOSIS — F32A Depression, unspecified: Secondary | ICD-10-CM

## 2015-07-07 DIAGNOSIS — Z6830 Body mass index (BMI) 30.0-30.9, adult: Secondary | ICD-10-CM | POA: Diagnosis not present

## 2015-07-07 DIAGNOSIS — K219 Gastro-esophageal reflux disease without esophagitis: Secondary | ICD-10-CM | POA: Diagnosis not present

## 2015-07-07 MED ORDER — OLMESARTAN MEDOXOMIL 40 MG PO TABS: 40.0000 mg | ORAL_TABLET | Freq: Every day | ORAL | Status: DC

## 2015-07-07 MED ORDER — FUROSEMIDE 20 MG PO TABS: 20.0000 mg | ORAL_TABLET | Freq: Every day | ORAL | Status: DC

## 2015-07-07 MED ORDER — OMEPRAZOLE-SODIUM BICARBONATE 40-1100 MG PO CAPS: ORAL_CAPSULE | ORAL | Status: DC

## 2015-07-07 MED ORDER — ESCITALOPRAM OXALATE 20 MG PO TABS: 20.0000 mg | ORAL_TABLET | Freq: Every day | ORAL | Status: DC

## 2015-07-07 MED ORDER — AMLODIPINE BESYLATE 5 MG PO TABS: 5.0000 mg | ORAL_TABLET | Freq: Every day | ORAL | Status: DC

## 2015-07-07 NOTE — Progress Notes (Signed)
Subjective:    Patient ID: Erica [name], female    DOB: 06-04-1930, 81 y.o.   MRN: [id number]   Patient here today for follow up of chronic medical problems.   Hypertension This is a chronic problem. The current episode started more than 1 year ago. The problem is unchanged. The problem is controlled. Risk factors for coronary artery disease include dyslipidemia, obesity, post-menopausal state and smoking/tobacco exposure. Past treatments include calcium channel blockers and angiotensin blockers. The current treatment provides mild improvement. Compliance problems include diet and exercise.  Hypertensive end-organ damage includes a thyroid problem. There is no history of CAD/MI or CVA.  Hyperlipidemia This is a chronic problem. The current episode started more than 1 year ago. The problem is controlled. Recent lipid tests were reviewed and are normal. Exacerbating diseases include obesity. She has no history of diabetes or hypothyroidism. The current treatment provides moderate improvement of lipids. Compliance problems include adherence to diet and adherence to exercise.  Risk factors for coronary artery disease include dyslipidemia, hypertension, obesity and post-menopausal.  Thyroid Problem Presents for follow-up (hypothyroidism) visit. The symptoms have been stable. Her past medical history is significant for hyperlipidemia. There is no history of diabetes.  GERD zegrid- works well for her- depression  lexapro working well to keep her symptoms under control- keeps her from worrying so much.  Review of Systems  Constitutional: Negative.   HENT: Negative.   Respiratory: Negative.   Cardiovascular: Negative.   Gastrointestinal: Negative.   Genitourinary: Negative.   Neurological: Negative.   Psychiatric/Behavioral: Negative.   All other systems reviewed and are negative.      Objective:   Physical Exam  Constitutional: She is oriented to person, place, and time. She appears  well-developed and well-nourished.  HENT:  Nose: Nose normal.  Mouth/Throat: Oropharynx is clear and moist.  Eyes: EOM are normal.  Neck: Trachea normal, normal range of motion and full passive range of motion without pain. Neck supple. No JVD present. Carotid bruit is not present. No thyromegaly present.  Cardiovascular: Normal rate, regular rhythm, normal heart sounds and intact distal pulses.  Exam reveals no gallop and no friction rub.   No murmur heard. Pulmonary/Chest: Effort normal and breath sounds normal.  Abdominal: Soft. Bowel sounds are normal. She exhibits no distension and no mass. There is no tenderness.  Musculoskeletal: Normal range of motion.  Lymphadenopathy:    She has no cervical adenopathy.  Neurological: She is alert and oriented to person, place, and time. She has normal reflexes.  Skin: Skin is warm and dry.  Psychiatric: She has a normal mood and affect. Her behavior is normal. Judgment and thought content normal.   BP 100/57 mmHg  Pulse 55  Temp(Src) 96.9 F (36.1 C) (Oral)  Ht _0  (1.549 m)  Wt 159 lb (72.122 kg)  BMI 30.06 kg/m2     Assessment & Plan:  1. Essential hypertension Do not add slat to diet - CMP14+EGFR - olmesartan (BENICAR) 40 MG tablet; Take 1 tablet (40 mg total) by mouth daily.  Dispense: 30 tablet; Refill: 5 - amLODipine (NORVASC) 5 MG tablet; Take 1 tablet (5 mg total) by mouth daily.  Dispense: 90 tablet; Refill: 1 - furosemide (LASIX) 20 MG tablet; Take 1 tablet (20 mg total) by mouth daily.  Dispense: 90 tablet; Refill: 5  2. Gastroesophageal reflux disease without esophagitis Avoid spicy foods Do not eat 2 hours prior to bedtime - omeprazole-sodium bicarbonate (ZEGERID) 40-1100 MG per capsule; TAKE ONE CAPSULE  DAILY BEFORE BREAKFAST AND AT BEDTIME.  Dispense: 60 capsule; Refill: 5  3. Gastroparesis Eat slowly  4. Hyperlipidemia Low fat diet - Lipid panel  5. Depression Stress management - escitalopram (LEXAPRO) 20 MG  tablet; Take 1 tablet (20 mg total) by mouth daily.  Dispense: 30 tablet; Refill: 5  6. Hypothyroidism, unspecified hypothyroidism type - levothyroxine (SYNTHROID, LEVOTHROID) 75 MCG tablet; Take 75 mcg by mouth daily before breakfast. - Thyroid Panel With TSH  7. BMI 30.0-30.9,adult Discussed diet and exercise for person with BMI >25 Will recheck weight in 3-6 months     Labs pending Health maintenance reviewed Diet and exercise encouraged Continue all meds Follow up  In 3 months   [place], [name]

## 2015-07-07 NOTE — Patient Instructions (Signed)

## 2015-07-08 ENCOUNTER — Telehealth: Payer: Self-pay | Admitting: *Deleted

## 2015-07-08 LAB — CMP14+EGFR
ALT: 22 IU/L (ref 0–32)
AST: 23 IU/L (ref 0–40)
Albumin/Globulin Ratio: 2 (ref 1.1–2.5)
Albumin: 4.1 g/dL (ref 3.5–4.7)
Alkaline Phosphatase: 57 IU/L (ref 39–117)
BILIRUBIN TOTAL: 0.4 mg/dL (ref 0.0–1.2)
BUN / CREAT RATIO: 19 (ref 11–26)
BUN: 24 mg/dL (ref 8–27)
CO2: 23 mmol/L (ref 18–29)
Calcium: 9.7 mg/dL (ref 8.7–10.3)
Chloride: 98 mmol/L (ref 97–108)
Creatinine, Ser: 1.26 mg/dL — ABNORMAL HIGH (ref 0.57–1.00)
GFR calc Af Amer: 46 mL/min/{1.73_m2} — ABNORMAL LOW (ref >59)
GFR, EST NON AFRICAN AMERICAN: 40 mL/min/{1.73_m2} — AB (ref >59)
GLOBULIN, TOTAL: 2.1 g/dL (ref 1.5–4.5)
Glucose: 102 mg/dL — ABNORMAL HIGH (ref 65–99)
POTASSIUM: 5.6 mmol/L — AB (ref 3.5–5.2)
Sodium: 135 mmol/L (ref 134–144)
Total Protein: 6.2 g/dL (ref 6.0–8.5)

## 2015-07-08 LAB — THYROID PANEL WITH TSH
Free Thyroxine Index: 3.5 (ref 1.2–4.9)
T3 Uptake Ratio: 34 % (ref 24–39)
T4, Total: 10.4 ug/dL (ref 4.5–12.0)
TSH: 1.65 u[IU]/mL (ref 0.450–4.500)

## 2015-07-08 LAB — LIPID PANEL
Chol/HDL Ratio: 4.5 ratio units — ABNORMAL HIGH (ref 0.0–4.4)
Cholesterol, Total: 208 mg/dL — ABNORMAL HIGH (ref 100–199)
HDL: 46 mg/dL (ref >39)
LDL Calculated: 132 mg/dL — ABNORMAL HIGH (ref 0–99)
Triglycerides: 149 mg/dL (ref 0–149)
VLDL CHOLESTEROL CAL: 30 mg/dL (ref 5–40)

## 2015-07-08 NOTE — Telephone Encounter (Signed)
Patient notified of lab results

## 2015-07-08 NOTE — Telephone Encounter (Signed)
-----   Message from [facility], [place] sent at 07/08/2015  8:15 AM EDT ----- Kidney and liver function stable k elevated needs to be repeated Cholesterol ldl are increasing- really should be on a statin will agree to take Thyroid panel normal Continue current meds- low fat diet and exercise and recheck in 3 months

## 2015-07-09 ENCOUNTER — Other Ambulatory Visit (INDEPENDENT_AMBULATORY_CARE_PROVIDER_SITE_OTHER): Payer: Medicare Other

## 2015-07-09 DIAGNOSIS — E875 Hyperkalemia: Secondary | ICD-10-CM | POA: Diagnosis not present

## 2015-07-09 NOTE — Progress Notes (Signed)
Lab only 

## 2015-07-10 LAB — BMP8+EGFR
BUN/Creatinine Ratio: 22 (ref 11–26)
BUN: 22 mg/dL (ref 8–27)
CALCIUM: 9.3 mg/dL (ref 8.7–10.3)
CHLORIDE: 98 mmol/L (ref 97–108)
CO2: 21 mmol/L (ref 18–29)
Creatinine, Ser: 0.98 mg/dL (ref 0.57–1.00)
GFR, EST AFRICAN AMERICAN: 62 mL/min/{1.73_m2} (ref >59)
GFR, EST NON AFRICAN AMERICAN: 54 mL/min/{1.73_m2} — AB (ref >59)
Glucose: 98 mg/dL (ref 65–99)
POTASSIUM: 5.6 mmol/L — AB (ref 3.5–5.2)
Sodium: 136 mmol/L (ref 134–144)

## 2015-07-16 ENCOUNTER — Other Ambulatory Visit (INDEPENDENT_AMBULATORY_CARE_PROVIDER_SITE_OTHER): Payer: Medicare Other

## 2015-07-16 DIAGNOSIS — E875 Hyperkalemia: Secondary | ICD-10-CM

## 2015-07-17 LAB — BMP8+EGFR
BUN/Creatinine Ratio: 18 (ref 11–26)
BUN: 22 mg/dL (ref 8–27)
CO2: 23 mmol/L (ref 18–29)
Calcium: 9.2 mg/dL (ref 8.7–10.3)
Chloride: 98 mmol/L (ref 97–108)
Creatinine, Ser: 1.19 mg/dL — ABNORMAL HIGH (ref 0.57–1.00)
GFR calc Af Amer: 49 mL/min/1.73 — ABNORMAL LOW
GFR calc non Af Amer: 43 mL/min/1.73 — ABNORMAL LOW
Glucose: 122 mg/dL — ABNORMAL HIGH (ref 65–99)
Potassium: 4.9 mmol/L (ref 3.5–5.2)
Sodium: 138 mmol/L (ref 134–144)

## 2015-07-21 ENCOUNTER — Encounter: Payer: Self-pay | Admitting: Internal Medicine

## 2015-07-21 ENCOUNTER — Ambulatory Visit (INDEPENDENT_AMBULATORY_CARE_PROVIDER_SITE_OTHER): Payer: Medicare Other | Admitting: Internal Medicine

## 2015-07-21 DIAGNOSIS — K219 Gastro-esophageal reflux disease without esophagitis: Secondary | ICD-10-CM | POA: Diagnosis not present

## 2015-07-21 NOTE — Assessment & Plan Note (Signed)
Repeat GES

## 2015-07-21 NOTE — Assessment & Plan Note (Signed)
DC bethanechol Ba swallow

## 2015-07-21 NOTE — Patient Instructions (Signed)
You have been scheduled for a Barium Esophogram at [facility] (1st floor of the hospital) on 07/28/2015 at 9:30am. Please arrive 15 minutes prior to your appointment for registration. Make certain not to have anything to eat or drink 3 hours prior to your test. If you need to reschedule for any reason, please contact radiology at [phone] to do so. __________________________________________________________________ A barium swallow is an examination that concentrates on views of the esophagus. This tends to be a double contrast exam (barium and two liquids which, when combined, create a gas to distend the wall of the oesophagus) or single contrast (non-ionic iodine based). The study is usually tailored to your symptoms so a good history is essential. Attention is paid during the study to the form, structure and configuration of the esophagus, looking for functional disorders (such as aspiration, dysphagia, achalasia, motility and reflux) EXAMINATION You may be asked to change into a gown, depending on the type of swallow being performed. A radiologist and radiographer will perform the procedure. The radiologist will advise you of the type of contrast selected for your procedure and direct you during the exam. You will be asked to stand, sit or lie in several different positions and to hold a small amount of fluid in your mouth before being asked to swallow while the imaging is performed .In some instances you may be asked to swallow barium coated marshmallows to assess the motility of a solid food bolus. The exam can be recorded as a digital or video fluoroscopy procedure. POST PROCEDURE It will take 1-2 days for the barium to pass through your system. To facilitate this, it is important, unless otherwise directed, to increase your fluids for the next 24-48hrs and to resume your normal diet.  This test typically takes about 30 minutes to  perform. __________________________________________________________________________________  Dennis Bast have been scheduled for a gastric emptying scan at [facility] on 08/05/2015 at 8:30am. Please arrive at least 15 minutes prior to your appointment for registration. Please make certain not to have anything to eat or drink after midnight the night before your test. Hold all stomach medications (ex: Zofran, phenergan, Reglan) 48 hours prior to your test. If you need to reschedule your appointment, please contact radiology scheduling at [phone]. _____________________________________________________________________ A gastric-emptying study measures how long it takes for food to move through your stomach. There are several ways to measure stomach emptying. In the most common test, you eat food that contains a small amount of radioactive material. A scanner that detects the movement of the radioactive material is placed over your abdomen to monitor the rate at which food leaves your stomach. This test normally takes about 2 hours to complete. _____________________________________________________________________

## 2015-07-21 NOTE — Progress Notes (Signed)
   Subjective:    Patient ID: Erica [name], female    DOB: 11-06-1930, 81 y.o.   MRN: [id number] Cc: reflux HPI Still having nocturnal regurgitation and "sometimes I vomit"  "I am scared I will choke on it and it will go into my lungs"  No dysphagia  Takes Zegeri AM and HS  In past I thought bethanechol helped but she says it is not. + some early satiety  Has RUQ pain - constant ache  Medications, allergies, past medical history, past surgical history, family history and social history are reviewed and updated in the EMR.   Review of Systems As above    Objective:   Physical Exam BP 132/60 mmHg  Pulse 60  Ht 5' 1.5" (1.562 m)  Wt 162 lb 4 oz (73.596 kg)  BMI 30.16 kg/m2 Lungs CTA Cor S1 and S2 Abd soft, mildly tender RUQ ribs ok BS+ no mass, hernia or splash       Assessment & Plan:  GERD (gastroesophageal reflux disease) DC bethanechol Ba swallow   Gastroparesis Repeat GES   15 minutes time spent with patient > half in counseling coordination of care  YL:TEIHDT,PNSQ MARGARET, [name]

## 2015-07-28 ENCOUNTER — Ambulatory Visit (HOSPITAL_COMMUNITY): Payer: Medicare Other

## 2015-07-30 ENCOUNTER — Ambulatory Visit (HOSPITAL_COMMUNITY)
Admission: RE | Admit: 2015-07-30 | Discharge: 2015-07-30 | Disposition: A | Discharge status: Home / Self Care | Payer: Medicare Other | Source: Ambulatory Visit | Attending: Internal Medicine | Admitting: Internal Medicine

## 2015-07-30 DIAGNOSIS — K30 Functional dyspepsia: Secondary | ICD-10-CM | POA: Diagnosis not present

## 2015-07-30 DIAGNOSIS — K219 Gastro-esophageal reflux disease without esophagitis: Secondary | ICD-10-CM | POA: Diagnosis not present

## 2015-07-30 DIAGNOSIS — K224 Dyskinesia of esophagus: Secondary | ICD-10-CM | POA: Insufficient documentation

## 2015-08-05 ENCOUNTER — Ambulatory Visit (HOSPITAL_COMMUNITY): Payer: Medicare Other

## 2015-08-06 ENCOUNTER — Ambulatory Visit (HOSPITAL_COMMUNITY)
Admission: RE | Admit: 2015-08-06 | Discharge: 2015-08-06 | Disposition: A | Discharge status: Home / Self Care | Payer: Medicare Other | Source: Ambulatory Visit | Attending: Internal Medicine | Admitting: Internal Medicine

## 2015-08-06 DIAGNOSIS — K3184 Gastroparesis: Secondary | ICD-10-CM | POA: Diagnosis not present

## 2015-08-06 DIAGNOSIS — K219 Gastro-esophageal reflux disease without esophagitis: Secondary | ICD-10-CM | POA: Diagnosis not present

## 2015-08-06 DIAGNOSIS — K30 Functional dyspepsia: Secondary | ICD-10-CM | POA: Diagnosis not present

## 2015-08-06 MED ORDER — TECHNETIUM TC 99M SULFUR COLLOID
2.1100 | Freq: Once | INTRAVENOUS | Status: DC | PRN
  Administered 2015-08-06: 2.11 via ORAL
  Filled 2015-08-06: qty 2.11

## 2015-08-07 NOTE — Progress Notes (Signed)
Quick Note:  1) Stomach does not empty well 2) We also know that esophagus does not squeeze well 3) recommendations:   - HOB > 30 degrees  - do not eat 6 hours before bedtime  - step 3 gastroparesis diet  - see me in 2 months  - may consider a medication (domperidone) but need to try these things first   ______

## 2015-08-27 DIAGNOSIS — E039 Hypothyroidism, unspecified: Secondary | ICD-10-CM | POA: Diagnosis not present

## 2015-08-27 DIAGNOSIS — I10 Essential (primary) hypertension: Secondary | ICD-10-CM | POA: Diagnosis not present

## 2015-08-27 DIAGNOSIS — I2510 Atherosclerotic heart disease of native coronary artery without angina pectoris: Secondary | ICD-10-CM | POA: Diagnosis not present

## 2015-08-27 DIAGNOSIS — E785 Hyperlipidemia, unspecified: Secondary | ICD-10-CM | POA: Diagnosis not present

## 2015-08-27 DIAGNOSIS — M1990 Unspecified osteoarthritis, unspecified site: Secondary | ICD-10-CM | POA: Diagnosis not present

## 2015-09-11 ENCOUNTER — Telehealth: Payer: Self-pay | Admitting: Nurse Practitioner

## 2015-09-11 MED ORDER — ALPRAZOLAM 0.5 MG PO TABS: 0.5000 mg | ORAL_TABLET | Freq: Two times a day (BID) | ORAL | Status: DC | PRN

## 2015-09-11 NOTE — Telephone Encounter (Signed)
Please call in xanax 0.5 1 po BID with sedation precautions with 0 refills

## 2015-09-11 NOTE — Telephone Encounter (Signed)
Rx called to [facility]. 

## 2015-10-01 ENCOUNTER — Ambulatory Visit (INDEPENDENT_AMBULATORY_CARE_PROVIDER_SITE_OTHER): Payer: Medicare Other | Admitting: Internal Medicine

## 2015-10-01 ENCOUNTER — Encounter: Payer: Self-pay | Admitting: Internal Medicine

## 2015-10-01 VITALS — BP 132/66 | HR 60 | Ht 61.5 in | Wt 159.1 lb

## 2015-10-01 DIAGNOSIS — K3184 Gastroparesis: Secondary | ICD-10-CM

## 2015-10-01 NOTE — Assessment & Plan Note (Signed)
Better Reconfirmed by 4 hr GES Still would like to try rx - will see if I can find domperidone for her

## 2015-10-01 NOTE — Patient Instructions (Addendum)
   I will look into obtaining a medicine called domperidone to help your stomach empty better. We will call you soon.  I appreciate the opportunity to care for you. Gatha Mayer, MD, [facility]

## 2015-10-01 NOTE — Progress Notes (Signed)
   Subjective:    Patient ID: Erica [name], female    DOB: Nov 17, 1930, 81 y.o.   MRN: [id number] Cc: follow-up gastroparesis  HPI Better as per Visit info, having less vomiting but still has some problems at times. Some reduced frequency of defecation but no severe constipation. Medications, allergies, past medical history, past surgical history, family history and social history are reviewed and updated in the EMR.  Review of Systems Son died suddenly 2 weeks ago has 7 boys left, she is grieving.    Objective:   Physical Exam  BP 132/66 mmHg  Pulse 60  Ht 5' 1.5" (1.562 m)  Wt 159 lb 2 oz (72.179 kg)  BMI 29.58 kg/m2 NAD      Assessment & Plan:  Gastroparesis Better Reconfirmed by 4 hr GES Still would like to try rx - will see if I can find domperidone for her

## 2015-10-08 ENCOUNTER — Encounter: Payer: Self-pay | Admitting: Nurse Practitioner

## 2015-10-08 ENCOUNTER — Ambulatory Visit (INDEPENDENT_AMBULATORY_CARE_PROVIDER_SITE_OTHER): Payer: Medicare Other | Admitting: Nurse Practitioner

## 2015-10-08 VITALS — BP 138/82 | HR 68 | Temp 97.6°F | Ht 61.0 in | Wt 153.0 lb

## 2015-10-08 DIAGNOSIS — I10 Essential (primary) hypertension: Secondary | ICD-10-CM | POA: Diagnosis not present

## 2015-10-08 DIAGNOSIS — E785 Hyperlipidemia, unspecified: Secondary | ICD-10-CM | POA: Diagnosis not present

## 2015-10-08 DIAGNOSIS — E039 Hypothyroidism, unspecified: Secondary | ICD-10-CM

## 2015-10-08 DIAGNOSIS — Z6830 Body mass index (BMI) 30.0-30.9, adult: Secondary | ICD-10-CM

## 2015-10-08 DIAGNOSIS — F329 Major depressive disorder, single episode, unspecified: Secondary | ICD-10-CM | POA: Diagnosis not present

## 2015-10-08 DIAGNOSIS — F32A Depression, unspecified: Secondary | ICD-10-CM

## 2015-10-08 DIAGNOSIS — Z23 Encounter for immunization: Secondary | ICD-10-CM

## 2015-10-08 MED ORDER — BUSPIRONE HCL 15 MG PO TABS: 15.0000 mg | ORAL_TABLET | Freq: Two times a day (BID) | ORAL | Status: DC

## 2015-10-08 MED ORDER — LEVOTHYROXINE SODIUM 75 MCG PO TABS: 75.0000 ug | ORAL_TABLET | Freq: Every day | ORAL | Status: DC

## 2015-10-08 NOTE — Progress Notes (Signed)
Subjective:    Patient ID: Erica [name], female    DOB: [birth year], 81 y.o.   MRN: [id number]   Patient here today for follow up of chronic medical problems. Patient son died suddenly about 1 month ago, and she is having a rough time with it. We called her in some xanax when it first happened and she said those made her hear voices.   Hypertension This is a chronic problem. The current episode started more than 1 year ago. The problem is unchanged. The problem is controlled. Risk factors for coronary artery disease include dyslipidemia, obesity, post-menopausal state and smoking/tobacco exposure. Past treatments include calcium channel blockers and angiotensin blockers. The current treatment provides mild improvement. Compliance problems include diet and exercise.  Hypertensive end-organ damage includes a thyroid problem. There is no history of CAD/MI or CVA.  Hyperlipidemia This is a chronic problem. The current episode started more than 1 year ago. The problem is controlled. Recent lipid tests were reviewed and are normal. Exacerbating diseases include obesity. She has no history of diabetes or hypothyroidism. The current treatment provides moderate improvement of lipids. Compliance problems include adherence to diet and adherence to exercise.  Risk factors for coronary artery disease include dyslipidemia, hypertension, obesity and post-menopausal.  Thyroid Problem Presents for follow-up (hypothyroidism) visit. The symptoms have been stable. Her past medical history is significant for hyperlipidemia. There is no history of diabetes.  GERD zegrid- works well for her- depression  lexapro working well to keep her symptoms under control- keeps her from worrying so much.  Review of Systems  Constitutional: Negative.   HENT: Negative.   Respiratory: Negative.   Cardiovascular: Negative.   Gastrointestinal: Negative.   Genitourinary: Negative.   Neurological: Negative.     Psychiatric/Behavioral: Negative.   All other systems reviewed and are negative.      Objective:   Physical Exam  Constitutional: She is oriented to person, place, and time. She appears well-developed and well-nourished.  HENT:  Nose: Nose normal.  Mouth/Throat: Oropharynx is clear and moist.  Eyes: EOM are normal.  Neck: Trachea normal, normal range of motion and full passive range of motion without pain. Neck supple. No JVD present. Carotid bruit is not present. No thyromegaly present.  Cardiovascular: Normal rate, regular rhythm, normal heart sounds and intact distal pulses.  Exam reveals no gallop and no friction rub.   No murmur heard. Pulmonary/Chest: Effort normal and breath sounds normal.  Abdominal: Soft. Bowel sounds are normal. She exhibits no distension and no mass. There is no tenderness.  Musculoskeletal: Normal range of motion.  Lymphadenopathy:    She has no cervical adenopathy.  Neurological: She is alert and oriented to person, place, and time. She has normal reflexes.  Skin: Skin is warm and dry.  Psychiatric: She has a normal mood and affect. Her behavior is normal. Judgment and thought content normal.   BP 138/82 mmHg  Pulse 68  Temp(Src) 97.6 F (36.4 C) (Oral)  Ht 5' 1"  (1.549 m)  Wt 153 lb (69.4 kg)  BMI 28.92 kg/m2      Assessment & Plan:  1. Essential hypertension Do not add salt to diet - CMP14+EGFR  2. Depression Stress management - busPIRone (BUSPAR) 15 MG tablet; Take 1 tablet (15 mg total) by mouth 2 (two) times daily.  Dispense: 60 tablet; Refill: 2  3. Hyperlipidemia Low fat diet - Lipid panel  4. BMI 30.0-30.9,adult Discussed diet and exercise for person with BMI >25 Will recheck weight in  3-6 months   5. Hypothyroidism, unspecified hypothyroidism type - levothyroxine (SYNTHROID, LEVOTHROID) 75 MCG tablet; Take 1 tablet (75 mcg total) by mouth daily before breakfast.  Dispense: 30 tablet; Refill: 5 - Thyroid Panel With  TSH    Labs pending Health maintenance reviewed Diet and exercise encouraged Continue all meds Follow up  In 3 month   [place], [name]

## 2015-10-08 NOTE — Patient Instructions (Signed)
Stress and Stress Management Stress is a normal reaction to life events. It is what you feel when life demands more than you are used to or more than you can handle. Some stress can be useful. For example, the stress reaction can help you catch the last bus of the day, study for a test, or meet a deadline at work. But stress that occurs too often or for too long can cause problems. It can affect your emotional health and interfere with relationships and normal daily activities. Too much stress can weaken your immune system and increase your risk for physical illness. If you already have a medical problem, stress can make it worse. CAUSES  All sorts of life events may cause stress. An event that causes stress for one person may not be stressful for another person. Major life events commonly cause stress. These may be positive or negative. Examples include losing your job, moving into a new home, getting married, having a baby, or losing a loved one. Less obvious life events may also cause stress, especially if they occur day after day or in combination. Examples include working long hours, driving in traffic, caring for children, being in debt, or being in a difficult relationship. SIGNS AND SYMPTOMS Stress may cause emotional symptoms including, the following:  Anxiety. This is feeling worried, afraid, on edge, overwhelmed, or out of control.  Anger. This is feeling irritated or impatient.  Depression. This is feeling sad, down, helpless, or guilty.  Difficulty focusing, remembering, or making decisions. Stress may cause physical symptoms, including the following:   Aches and pains. These may affect your head, neck, back, stomach, or other areas of your body.  Tight muscles or clenched jaw.  Low energy or trouble sleeping. Stress may cause unhealthy behaviors, including the following:   Eating to feel better (overeating) or skipping meals.  Sleeping too little, too much, or both.  Working  too much or putting off tasks (procrastination).  Smoking, drinking alcohol, or using drugs to feel better. DIAGNOSIS  Stress is diagnosed through an assessment by your health care provider. Your health care provider will ask questions about your symptoms and any stressful life events.Your health care provider will also ask about your medical history and may order blood tests or other tests. Certain medical conditions and medicine can cause physical symptoms similar to stress. Mental illness can cause emotional symptoms and unhealthy behaviors similar to stress. Your health care provider may refer you to a mental health professional for further evaluation.  TREATMENT  Stress management is the recommended treatment for stress.The goals of stress management are reducing stressful life events and coping with stress in healthy ways.  Techniques for reducing stressful life events include the following:  Stress identification. Self-monitor for stress and identify what causes stress for you. These skills may help you to avoid some stressful events.  Time management. Set your priorities, keep a calendar of events, and learn to say "no." These tools can help you avoid making too many commitments. Techniques for coping with stress include the following:  Rethinking the problem. Try to think realistically about stressful events rather than ignoring them or overreacting. Try to find the positives in a stressful situation rather than focusing on the negatives.  Exercise. Physical exercise can release both physical and emotional tension. The key is to find a form of exercise you enjoy and do it regularly.  Relaxation techniques. These relax the body and mind. Examples include yoga, meditation, tai chi, biofeedback, deep  breathing, progressive muscle relaxation, listening to music, being out in nature, journaling, and other hobbies. Again, the key is to find one or more that you enjoy and can do  regularly.  Healthy lifestyle. Eat a balanced diet, get plenty of sleep, and do not smoke. Avoid using alcohol or drugs to relax.  Strong support network. Spend time with family, friends, or other people you enjoy being around.Express your feelings and talk things over with someone you trust. Counseling or talktherapy with a mental health professional may be helpful if you are having difficulty managing stress on your own. Medicine is typically not recommended for the treatment of stress.Talk to your health care provider if you think you need medicine for symptoms of stress. HOME CARE INSTRUCTIONS  Keep all follow-up visits as directed by your health care provider.  Take all medicines as directed by your health care provider. SEEK MEDICAL CARE IF:  Your symptoms get worse or you start having new symptoms.  You feel overwhelmed by your problems and can no longer manage them on your own. SEEK IMMEDIATE MEDICAL CARE IF:  You feel like hurting yourself or someone else.   This information is not intended to replace advice given to you by your health care provider. Make sure you discuss any questions you have with your health care provider.   Document Released: 06/08/2001 Document Revised: 01/03/2015 Document Reviewed: 08/07/2013 Elsevier Interactive Patient Education 2016 Else[employer] Inc.  

## 2015-10-09 ENCOUNTER — Telehealth: Payer: Self-pay | Admitting: *Deleted

## 2015-10-09 LAB — CMP14+EGFR
ALBUMIN: 4.4 g/dL (ref 3.5–4.7)
ALT: 35 IU/L — ABNORMAL HIGH (ref 0–32)
AST: 28 IU/L (ref 0–40)
Albumin/Globulin Ratio: 2 (ref 1.1–2.5)
Alkaline Phosphatase: 66 IU/L (ref 39–117)
BUN / CREAT RATIO: 16 (ref 11–26)
BUN: 16 mg/dL (ref 8–27)
Bilirubin Total: 0.3 mg/dL (ref 0.0–1.2)
CO2: 21 mmol/L (ref 18–29)
CREATININE: 0.99 mg/dL (ref 0.57–1.00)
Calcium: 10.3 mg/dL (ref 8.7–10.3)
Chloride: 98 mmol/L (ref 97–108)
GFR calc non Af Amer: 53 mL/min/{1.73_m2} — ABNORMAL LOW (ref >59)
GFR, EST AFRICAN AMERICAN: 61 mL/min/{1.73_m2} (ref >59)
GLUCOSE: 109 mg/dL — AB (ref 65–99)
Globulin, Total: 2.2 g/dL (ref 1.5–4.5)
Potassium: 5.1 mmol/L (ref 3.5–5.2)
Sodium: 136 mmol/L (ref 134–144)
TOTAL PROTEIN: 6.6 g/dL (ref 6.0–8.5)

## 2015-10-09 LAB — LIPID PANEL
CHOLESTEROL TOTAL: 164 mg/dL (ref 100–199)
Chol/HDL Ratio: 3.1 ratio units (ref 0.0–4.4)
HDL: 53 mg/dL (ref >39)
LDL CALC: 96 mg/dL (ref 0–99)
Triglycerides: 74 mg/dL (ref 0–149)
VLDL CHOLESTEROL CAL: 15 mg/dL (ref 5–40)

## 2015-10-09 LAB — THYROID PANEL WITH TSH
Free Thyroxine Index: 3.9 (ref 1.2–4.9)
T3 Uptake Ratio: 32 % (ref 24–39)
T4 TOTAL: 12.1 ug/dL — AB (ref 4.5–12.0)
TSH: 6.97 u[IU]/mL — ABNORMAL HIGH (ref 0.450–4.500)

## 2015-10-09 NOTE — Telephone Encounter (Signed)
Patient feeling anxious and concerned it could be coming from a new medicine.  She had taken only one pill early in the am of Buspar.  She said her heart rate was at 62 and she felt nervous  I advised to not take evening pill and wait till am to try another dose. Keep notes and if things go well, take the evening dose tomorrow.  She admits she has a lot of anxiety and bad nerves.   If things go worse this evening, advised her to call our provider on our after -hours number.

## 2015-10-22 ENCOUNTER — Ambulatory Visit (INDEPENDENT_AMBULATORY_CARE_PROVIDER_SITE_OTHER): Payer: Medicare Other | Admitting: Family Medicine

## 2015-10-22 ENCOUNTER — Encounter: Payer: Self-pay | Admitting: Family Medicine

## 2015-10-22 VITALS — BP 139/69 | HR 63 | Temp 97.1°F | Ht 61.0 in | Wt 153.2 lb

## 2015-10-22 DIAGNOSIS — G44209 Tension-type headache, unspecified, not intractable: Secondary | ICD-10-CM | POA: Diagnosis not present

## 2015-10-22 MED ORDER — KETOROLAC TROMETHAMINE 30 MG/ML IJ SOLN
30.0000 mg | Freq: Once | INTRAMUSCULAR | Status: AC
  Administered 2015-10-22: 30 mg via INTRAMUSCULAR

## 2015-10-22 NOTE — Progress Notes (Signed)
BP 139/69 mmHg  Pulse 63  Temp(Src) 97.1 F (36.2 C) (Oral)  Ht 5' 1"  (1.549 m)  Wt 153 lb 3.2 oz (69.491 kg)  BMI 28.96 kg/m2   Subjective:    Patient ID: Erica [name], female    DOB: 1930-03-21, 81 y.o.   MRN: [id number]  HPI: [name] is a 81 y.o. female presenting on 10/22/2015 for Headache   HPI Headache Patient has been experiencing what she describes as head pressure bilaterally on the top of her head. This is been going on since last night. She says she has never had a headache quite like this one before. She denies any photophobia or phonophobia or visual disturbances or pain going anywhere else besides just on the top of her head. She denies any numbness or weakness. She denies any changes in taste or smell or hearing. She took one aspirin for the headache and some minimal improvement.  Relevant past medical, surgical, family and social history reviewed and updated as indicated. Interim medical history since our last visit reviewed. Allergies and medications reviewed and updated.  Review of Systems  Constitutional: Negative for fever and chills.  HENT: Negative for congestion, ear discharge and ear pain.   Eyes: Negative for redness and visual disturbance.  Respiratory: Negative for chest tightness and shortness of breath.   Cardiovascular: Negative for chest pain and leg swelling.  Genitourinary: Negative for dysuria and difficulty urinating.  Musculoskeletal: Negative for back pain and gait problem.  Skin: Negative for rash.  Neurological: Positive for headaches. Negative for dizziness, tremors, syncope, facial asymmetry, speech difficulty, weakness, light-headedness and numbness.  Psychiatric/Behavioral: Negative for behavioral problems and agitation.  All other systems reviewed and are negative.   Per HPI unless specifically indicated above     Medication List       This list is accurate as of: 10/22/15  6:05 PM.  Always use your most recent med  list.               amiodarone 200 MG tablet  Commonly known as:  PACERONE  Take 1 tablet (200 mg total) by mouth daily.     amLODipine 5 MG tablet  Commonly known as:  NORVASC  Take 1 tablet (5 mg total) by mouth daily.     aspirin 81 MG tablet  Take 81 mg by mouth daily.     busPIRone 15 MG tablet  Commonly known as:  BUSPAR  Take 1 tablet (15 mg total) by mouth 2 (two) times daily.     escitalopram 20 MG tablet  Commonly known as:  LEXAPRO  Take 1 tablet (20 mg total) by mouth daily.     furosemide 20 MG tablet  Commonly known as:  LASIX  Take 1 tablet (20 mg total) by mouth daily.     levothyroxine 75 MCG tablet  Commonly known as:  SYNTHROID, LEVOTHROID  Take 1 tablet (75 mcg total) by mouth daily before breakfast.     olmesartan 40 MG tablet  Commonly known as:  BENICAR  Take 1 tablet (40 mg total) by mouth daily.     omeprazole-sodium bicarbonate 40-1100 MG capsule  Commonly known as:  ZEGERID  TAKE ONE CAPSULE DAILY BEFORE BREAKFAST AND AT BEDTIME.     spironolactone 25 MG tablet  Commonly known as:  ALDACTONE  Take 1 tablet by mouth daily.           Objective:    BP 139/69 mmHg  Pulse 63  Temp(Src) 97.1 F (36.2 C) (Oral)  Ht 5' 1"  (1.549 m)  Wt 153 lb 3.2 oz (69.491 kg)  BMI 28.96 kg/m2  Wt Readings from Last 3 Encounters:  10/22/15 153 lb 3.2 oz (69.491 kg)  10/08/15 153 lb (69.4 kg)  10/01/15 159 lb 2 oz (72.179 kg)    Physical Exam  Constitutional: She is oriented to person, place, and time. She appears well-developed and well-nourished. No distress.  HENT:  Right Ear: Tympanic membrane, external ear and ear canal normal.  Left Ear: Tympanic membrane, external ear and ear canal normal.  Nose: Nose normal.  Mouth/Throat: Uvula is midline, oropharynx is clear and moist and mucous membranes are normal. No oropharyngeal exudate.  Eyes: Conjunctivae and EOM are normal. Pupils are equal, round, and reactive to light. Right eye exhibits no  discharge. Left eye exhibits no discharge.  Neck: Neck supple. No thyromegaly present.  Cardiovascular: Normal rate, regular rhythm, normal heart sounds and intact distal pulses.   No murmur heard. Pulmonary/Chest: Effort normal and breath sounds normal. No respiratory distress. She has no wheezes.  Musculoskeletal: Normal range of motion. She exhibits no edema or tenderness.  Lymphadenopathy:    She has no cervical adenopathy.  Neurological: She is alert and oriented to person, place, and time. She has normal strength and normal reflexes. She displays normal reflexes. No cranial nerve deficit or sensory deficit. She exhibits normal muscle tone. Coordination normal.  Skin: Skin is warm and dry. No rash noted. She is not diaphoretic.  Psychiatric: She has a normal mood and affect. Her behavior is normal.  Nursing note and vitals reviewed.   Results for orders placed or performed in visit on 10/08/15  CMP14+EGFR  Result Value Ref Range   Glucose 109 (H) 65 - 99 mg/dL   BUN 16 8 - 27 mg/dL   Creatinine, Ser 0.99 0.57 - 1.00 mg/dL   GFR calc non Af Amer 53 (L) >59 mL/min/1.73   GFR calc Af Amer 61 >59 mL/min/1.73   BUN/Creatinine Ratio 16 11 - 26   Sodium 136 134 - 144 mmol/L   Potassium 5.1 3.5 - 5.2 mmol/L   Chloride 98 97 - 108 mmol/L   CO2 21 18 - 29 mmol/L   Calcium 10.3 8.7 - 10.3 mg/dL   Total Protein 6.6 6.0 - 8.5 g/dL   Albumin 4.4 3.5 - 4.7 g/dL   Globulin, Total 2.2 1.5 - 4.5 g/dL   Albumin/Globulin Ratio 2.0 1.1 - 2.5   Bilirubin Total 0.3 0.0 - 1.2 mg/dL   Alkaline Phosphatase 66 39 - 117 IU/L   AST 28 0 - 40 IU/L   ALT 35 (H) 0 - 32 IU/L  Lipid panel  Result Value Ref Range   Cholesterol, Total 164 100 - 199 mg/dL   Triglycerides 74 0 - 149 mg/dL   HDL 53 >39 mg/dL   VLDL Cholesterol Cal 15 5 - 40 mg/dL   LDL Calculated 96 0 - 99 mg/dL   Chol/HDL Ratio 3.1 0.0 - 4.4 ratio units  Thyroid Panel With TSH  Result Value Ref Range   TSH 6.970 (H) 0.450 - 4.500 uIU/mL    T4, Total 12.1 (H) 4.5 - 12.0 ug/dL   T3 Uptake Ratio 32 24 - 39 %   Free Thyroxine Index 3.9 1.2 - 4.9      Assessment & Plan:   Problem List Items Addressed This Visit    None    Visit Diagnoses    Tension-type headache, not  intractable, unspecified chronicity pattern    -  Primary    She has been under a lot of stress with her son's recent passing via an MVA. Likely tension headache, will try Toradol, if not improved go to the ED    Relevant Medications    ketorolac (TORADOL) 30 MG/ML injection 30 mg (Start on 10/22/2015  6:15 PM)        Follow up plan: Return if symptoms worsen or fail to improve.  Caryl Pina, MD [facility] 10/22/2015, 6:05 PM

## 2015-10-31 ENCOUNTER — Telehealth: Payer: Self-pay | Admitting: Internal Medicine

## 2015-10-31 MED ORDER — ONDANSETRON HCL 4 MG PO TABS: 4.0000 mg | ORAL_TABLET | Freq: Three times a day (TID) | ORAL | Status: DC | PRN

## 2015-10-31 NOTE — Telephone Encounter (Signed)
Patient reports that she is having a hard time with her gastroparesis.  She is not able to eat, she is vomiting, and she has lost about 10 lbs.  Her son recently died and this has worsened her symptoms.  She will try a gastroparesis diet this weekend.  I did discuss with Alonza Bogus, PA and she has authorized zofran 4 mg 1 po q 8 hours prn.  I sent in #30.  Per your last office note 10/01/15 you were looking into the possibility of domperidone.  Please advise on this.

## 2015-11-02 ENCOUNTER — Other Ambulatory Visit: Payer: Self-pay | Admitting: Internal Medicine

## 2015-11-02 NOTE — Telephone Encounter (Signed)
I would really like patient to take buspar daily as rx because has less addictive component- i gree her stomach problems are coming from her nerves

## 2015-11-02 NOTE — Telephone Encounter (Signed)
It sounds to me that these sxs are quite likely more related to stress/grief and depression I see that her PCP has been addressing this.  I have not yet found how to get the domperidone but will look into that this week  I think trying low dose mirtazipine could be useful though see she has recentky begun buspirone which could help but may take some time.  Let her know my thoughts and I am ccing her PCP and will await her response to these thoughts and try to coordinate with [name] NP

## 2015-11-03 ENCOUNTER — Telehealth: Payer: Self-pay | Admitting: Nurse Practitioner

## 2015-11-03 ENCOUNTER — Telehealth: Payer: Self-pay | Admitting: Internal Medicine

## 2015-11-03 ENCOUNTER — Telehealth: Payer: Self-pay | Admitting: Gastroenterology

## 2015-11-03 NOTE — Telephone Encounter (Signed)
Stp and she started taking metamucil today and a senakot. Pt wants to know what to do. Advised pt to see if she has a BM tonight, if not to CB tomorrow. Pt voiced understanding.

## 2015-11-03 NOTE — Telephone Encounter (Signed)
Patient wants to be seen today 11-03-15

## 2015-11-03 NOTE — Telephone Encounter (Signed)
Patient called with c/o constipation  associated with nausea and vomiting. No BM in past 5 days. She is unable to drink fluids and is having palpitation. She will come to ER for evaluation

## 2015-11-03 NOTE — Telephone Encounter (Signed)
See phone note from 10/31/15 for additional notes and details.

## 2015-11-03 NOTE — Telephone Encounter (Signed)
Stp and she states she wants to be seen by you today not with [place].

## 2015-11-03 NOTE — Telephone Encounter (Signed)
Patient advised of Dr. Carlean Purl and Marry-Margaret Martin's response. Patient also reports constipation.  She reports that no BM since Wed.  She is advised that she should add Miralax 1-3 times a day and make sure she is taking her Buspar daily as prescribed.  She is scheduled for follow up for 01/01/15 at 11:00

## 2015-11-05 ENCOUNTER — Ambulatory Visit (INDEPENDENT_AMBULATORY_CARE_PROVIDER_SITE_OTHER): Payer: Medicare Other | Admitting: Family Medicine

## 2015-11-05 ENCOUNTER — Ambulatory Visit (INDEPENDENT_AMBULATORY_CARE_PROVIDER_SITE_OTHER): Payer: Medicare Other

## 2015-11-05 ENCOUNTER — Encounter: Payer: Self-pay | Admitting: Family Medicine

## 2015-11-05 VITALS — BP 140/61 | HR 60 | Temp 97.9°F | Ht 61.0 in | Wt 152.6 lb

## 2015-11-05 DIAGNOSIS — K219 Gastro-esophageal reflux disease without esophagitis: Secondary | ICD-10-CM

## 2015-11-05 DIAGNOSIS — K3184 Gastroparesis: Secondary | ICD-10-CM

## 2015-11-05 DIAGNOSIS — R1010 Upper abdominal pain, unspecified: Secondary | ICD-10-CM | POA: Diagnosis not present

## 2015-11-05 MED ORDER — ERYTHROMYCIN BASE 500 MG PO TABS: ORAL_TABLET | ORAL | Status: DC

## 2015-11-05 NOTE — Patient Instructions (Signed)
Be sure to take the buspar twice every day. It takes a while to get used to it, but works very well and is very safe if you give it a chance. If it makes you drowsy in the morning, take 1/2  Of a pil in the morning and a whole pill in the evening for a few days, then try to go back to a whole tablet twice a day.

## 2015-11-05 NOTE — Progress Notes (Signed)
Subjective:  Patient ID: Erica [name], female    DOB: [birth year]  Age: 81 y.o. MRN: [id number]  CC: Abdominal Pain   HPI Erica [name] presents for CONSTIPATION. Several days between bowel movements. Abd. Is distended, swollen. Appetite is diminished. Has been diagnosed with gastroparesis. She cannot tolerate Reglan. Denies diarrhea.   follow-up of hypertension. Patient has no history of headache chest pain or shortness of breath or recent cough. Patient also denies symptoms of TIA such as numbness weakness lateralizing. Patient checks  blood pressure at home and has not had any elevated readings recently. Patient denies side effects from his medication. States taking it regularly.  Patient presents for follow-up on  thyroid. She has a history of hypothyroidism for many years. It has been stable recently. Pt. denies any change in  voice, loss of hair, heat or cold intolerance. Energy level has been adequate to good. No myxedema. Medication is as noted below. Verified that pt is taking it daily on an empty stomach. Well tolerated.  Depression anxiety continue. Not taking the BuSpar regularly. She is under the impression that it was for when necessary use like the Xanax had been.  History Erica [name] has a past medical history of MVP (mitral valve prolapse); Atrial fibrillation ([place]); Hyperlipidemia; Hypothyroid; GERD (gastroesophageal reflux disease); Gastroparesis; Hypertension; Chronic back pain; Adenomatous rectal polyp; Bleeding internal hemorrhoids; Lichen sclerosus et atrophicus of the vulva; Diverticulosis of colon (without mention of hemorrhage); Hiatal hernia (09/1999); TIA (transient ischemic attack); and Ischemic colitis ([place]).   She has past surgical history that includes Cataract extraction; Colonoscopy (02/2011); Upper gastrointestinal endoscopy (08/23/2011); Sigmoidoscopy (08/23/2011); Tubal ligation; Cholecystectomy; and left heart catheterization with coronary angiogram (N/A,  07/17/2013).   Her family history includes Colon cancer in her paternal aunt; Esophageal cancer in her father; Lung cancer in her brother; Stomach cancer in her brother.She reports that she has never smoked. She has never used smokeless tobacco. She reports that she does not drink alcohol or use illicit drugs.  Current Outpatient Prescriptions on File Prior to Visit  Medication Sig Dispense Refill  . amiodarone (PACERONE) 200 MG tablet Take 1 tablet (200 mg total) by mouth daily. 90 tablet 1  . amLODipine (NORVASC) 5 MG tablet Take 1 tablet (5 mg total) by mouth daily. 90 tablet 1  . aspirin 81 MG tablet Take 81 mg by mouth daily.      . busPIRone (BUSPAR) 15 MG tablet Take 1 tablet (15 mg total) by mouth 2 (two) times daily. 60 tablet 2  . escitalopram (LEXAPRO) 20 MG tablet Take 1 tablet (20 mg total) by mouth daily. 30 tablet 5  . furosemide (LASIX) 20 MG tablet Take 1 tablet (20 mg total) by mouth daily. 90 tablet 5  . levothyroxine (SYNTHROID, LEVOTHROID) 75 MCG tablet Take 1 tablet (75 mcg total) by mouth daily before breakfast. 30 tablet 5  . olmesartan (BENICAR) 40 MG tablet Take 1 tablet (40 mg total) by mouth daily. 30 tablet 5  . omeprazole-sodium bicarbonate (ZEGERID) 40-1100 MG capsule TAKE 1 CAPSULE BY MOUTH TWICE A DAY BEFORE BREAKFAST AND AT BEDTIME 60 capsule 2  . ondansetron (ZOFRAN) 4 MG tablet Take 1 tablet (4 mg total) by mouth every 8 (eight) hours as needed for nausea or vomiting. 30 tablet 0  . spironolactone (ALDACTONE) 25 MG tablet Take 1 tablet by mouth daily.      No current facility-administered medications on file prior to visit.    ROS Review of Systems  Constitutional:  Negative for fever, chills, diaphoresis, appetite change, fatigue and unexpected weight change.  HENT: Negative for congestion, ear pain, hearing loss, postnasal drip, rhinorrhea, sneezing, sore throat and trouble swallowing.   Eyes: Negative for pain.  Respiratory: Negative for cough, chest  tightness and shortness of breath.   Cardiovascular: Negative for chest pain and palpitations.  Gastrointestinal: Positive for nausea, abdominal pain, constipation and abdominal distention. Negative for vomiting and diarrhea.  Genitourinary: Negative for dysuria, frequency and menstrual problem.  Musculoskeletal: Negative for joint swelling and arthralgias.  Skin: Negative for rash.  Neurological: Negative for dizziness, weakness, numbness and headaches.  Psychiatric/Behavioral: Positive for dysphoric mood. Negative for agitation. The patient is nervous/anxious.     Objective:  BP 140/61 mmHg  Pulse 60  Temp(Src) 97.9 F (36.6 C) (Oral)  Ht 5\' 1"  (1.549 m)  Wt 152 lb 9.6 oz (69.219 kg)  BMI 28.85 kg/m2  SpO2 97%  BP Readings from Last 3 Encounters:  11/05/15 140/61  10/22/15 139/69  10/08/15 138/82    Wt Readings from Last 3 Encounters:  11/05/15 152 lb 9.6 oz (69.219 kg)  10/22/15 153 lb 3.2 oz (69.491 kg)  10/08/15 153 lb (69.4 kg)     Physical Exam  Constitutional: She is oriented to person, place, and time. She appears well-developed and well-nourished. No distress.  HENT:  Head: Normocephalic and atraumatic.  Eyes: Conjunctivae are normal. Pupils are equal, round, and reactive to light.  Neck: Normal range of motion. Neck supple. No thyromegaly present.  Cardiovascular: Normal rate, regular rhythm and normal heart sounds.   No murmur heard. Pulmonary/Chest: Effort normal and breath sounds normal. No respiratory distress. She has no wheezes. She has no rales.  Abdominal: Soft. Bowel sounds are normal. She exhibits distension. There is tenderness (mild and diffuse).  Musculoskeletal: Normal range of motion.  Lymphadenopathy:    She has no cervical adenopathy.  Neurological: She is alert and oriented to person, place, and time.  Skin: Skin is warm and dry.  Psychiatric: She has a normal mood and affect. Her behavior is normal. Judgment and thought content normal.     No results found for: HGBA1C  Lab Results  Component Value Date   WBC 9.6 03/21/2014   HGB 13.3 03/21/2014   HCT 39.2 03/21/2014   PLT 217 03/21/2014   GLUCOSE 109* 10/08/2015   CHOL 164 10/08/2015   TRIG 74 10/08/2015   HDL 53 10/08/2015   LDLCALC 96 10/08/2015   ALT 35* 10/08/2015   AST 28 10/08/2015   NA 136 10/08/2015   K 5.1 10/08/2015   CL 98 10/08/2015   CREATININE 0.99 10/08/2015   BUN 16 10/08/2015   CO2 21 10/08/2015   TSH 6.970* 10/08/2015    Nm Gastric Emptying  08/06/2015  CLINICAL DATA:  Gastroparesis.  Gastroesophageal reflux. EXAM: NUCLEAR MEDICINE GASTRIC EMPTYING SCAN TECHNIQUE: After oral ingestion of radiolabeled meal, sequential abdominal images were obtained for 4 hours. Percentage of activity emptying the stomach was calculated at 1 hour, 2 hour, 3 hours. RADIOPHARMACEUTICALS:  2.1 mCi Tc-35m MDP labeled sulfur colloid orally COMPARISON:  None. FINDINGS: Expected location of the stomach in the left upper quadrant. Ingested meal empties the stomach gradually over the course of the study. 23.4% emptied at 1 hr ( normal >= 10%) 37.5 % emptied at 2 hr ( normal >= 40%) 50.8% emptied at 3 hr ( normal >= 70%) IMPRESSION: Delayed gastric emptying. Electronically Signed   By: Marcello Moores  Register   On: 08/06/2015 12:31  Assessment & Plan:   Erica [name] was seen today for abdominal pain.  Diagnoses and all orders for this visit:  Pain of upper abdomen -     DG Abd 1 View  Gastroparesis -     erythromycin (E-MYCIN) 500 MG tablet; Take 30 min ac for gastric motility  Gastroesophageal reflux disease without esophagitis   I am having Erica [name] start on erythromycin base. I am also having her maintain her aspirin, amiodarone, olmesartan, amLODipine, furosemide, escitalopram, spironolactone, busPIRone, levothyroxine, ondansetron, omeprazole-sodium bicarbonate, ALPRAZolam, bethanechol, and rosuvastatin.  Meds ordered this encounter  Medications  . ALPRAZolam  (XANAX) 0.5 MG tablet    Sig:   . bethanechol (URECHOLINE) 10 MG tablet    Sig:   . rosuvastatin (CRESTOR) 10 MG tablet    Sig:   . erythromycin (E-MYCIN) 500 MG tablet    Sig: Take 30 min ac for gastric motility    Dispense:  90 tablet    Refill:  2   Be sure to take the buspar twice every day. It takes a while to get used to it, but works very well and is very safe if you give it a chance. If it makes you drowsy in the morning, take 1/2  Of a pil in the morning and a whole pill in the evening for a few days, then try to go back to a whole tablet twice a day. With time we should taper her off of alprazolam. Follow-up: Return in about 1 month (around 12/05/2015), or with Shelah Lewandowsky, for gastroparesis and nerves.  Claretta Fraise, M.D.

## 2015-11-17 ENCOUNTER — Encounter: Payer: Self-pay | Admitting: Nurse Practitioner

## 2015-11-17 ENCOUNTER — Ambulatory Visit (INDEPENDENT_AMBULATORY_CARE_PROVIDER_SITE_OTHER): Payer: Medicare Other | Admitting: Nurse Practitioner

## 2015-11-17 VITALS — BP 121/65 | HR 64 | Temp 97.1°F | Ht 61.0 in | Wt 154.0 lb

## 2015-11-17 DIAGNOSIS — K3184 Gastroparesis: Secondary | ICD-10-CM | POA: Diagnosis not present

## 2015-11-17 DIAGNOSIS — F32A Depression, unspecified: Secondary | ICD-10-CM

## 2015-11-17 DIAGNOSIS — F329 Major depressive disorder, single episode, unspecified: Secondary | ICD-10-CM

## 2015-11-17 DIAGNOSIS — K5901 Slow transit constipation: Secondary | ICD-10-CM

## 2015-11-17 MED ORDER — ESCITALOPRAM OXALATE 20 MG PO TABS: ORAL_TABLET | ORAL | Status: DC

## 2015-11-17 NOTE — Patient Instructions (Signed)

## 2015-11-17 NOTE — Progress Notes (Signed)
   Subjective:    Patient ID: Erica [name], female    DOB: 1930/07/16, 81 y.o.   MRN: [id number]  HPI Patient in today for follow up of stomach issues. SHe saw Dr. Livia Snellen and he diagnosed her with gastoparesis and started her on erythromycin base prior to meals- This all started when she lost her son. The erythromycin has helped some but she is also dealing with the death  Of her on better. She says it feels like her food is going down better. SHe is c/o constipation- metamucil and stool softners are not helping- goes 2-3 days without having bowel movement.    Review of Systems  Constitutional: Negative.   HENT: Negative.   Respiratory: Negative.   Cardiovascular: Negative.   Genitourinary: Negative.   Musculoskeletal: Negative.   Psychiatric/Behavioral: Negative.   All other systems reviewed and are negative.      Objective:   Physical Exam  Constitutional: She is oriented to person, place, and time. She appears well-developed and well-nourished.  Cardiovascular: Normal rate, regular rhythm and normal heart sounds.   Pulmonary/Chest: Effort normal and breath sounds normal.  Abdominal: Soft. She exhibits no distension. There is no tenderness.  Neurological: She is alert and oriented to person, place, and time.  Skin: Skin is warm.  Psychiatric: She has a normal mood and affect. Her behavior is normal. Judgment and thought content normal.   BP 121/65 mmHg  Pulse 64  Temp(Src) 97.1 F (36.2 C) (Oral)  Ht 5\' 1"  (1.549 m)  Wt 154 lb (69.854 kg)  BMI 29.11 kg/m2       Assessment & Plan:  1. Depression Patient wanted lexapro increased - escitalopram (LEXAPRO) 20 MG tablet; 2 po Qd  Dispense: 60 tablet; Refill: 5  2. Gastroparesis Eat slowly Continue erythromycin as rx  3. Slow transit constipation Force fluids miralax daily increase fiber in diet  Mary-Margaret Hassell Done, [name]

## 2015-11-26 DIAGNOSIS — Z13820 Encounter for screening for osteoporosis: Secondary | ICD-10-CM | POA: Diagnosis not present

## 2015-11-26 DIAGNOSIS — Z1231 Encounter for screening mammogram for malignant neoplasm of breast: Secondary | ICD-10-CM | POA: Diagnosis not present

## 2015-11-26 DIAGNOSIS — L904 Acrodermatitis chronica atrophicans: Secondary | ICD-10-CM | POA: Diagnosis not present

## 2015-11-26 DIAGNOSIS — M818 Other osteoporosis without current pathological fracture: Secondary | ICD-10-CM | POA: Diagnosis not present

## 2015-11-26 DIAGNOSIS — F4321 Adjustment disorder with depressed mood: Secondary | ICD-10-CM | POA: Diagnosis not present

## 2015-11-26 DIAGNOSIS — N958 Other specified menopausal and perimenopausal disorders: Secondary | ICD-10-CM | POA: Diagnosis not present

## 2015-11-26 DIAGNOSIS — Z6827 Body mass index (BMI) 27.0-27.9, adult: Secondary | ICD-10-CM | POA: Diagnosis not present

## 2015-11-26 DIAGNOSIS — Z01419 Encounter for gynecological examination (general) (routine) without abnormal findings: Secondary | ICD-10-CM | POA: Diagnosis not present

## 2015-11-26 DIAGNOSIS — M816 Localized osteoporosis [Lequesne]: Secondary | ICD-10-CM | POA: Diagnosis not present

## 2015-11-28 ENCOUNTER — Encounter: Payer: Self-pay | Admitting: Gastroenterology

## 2015-12-03 ENCOUNTER — Other Ambulatory Visit: Payer: Self-pay | Admitting: Obstetrics and Gynecology

## 2015-12-03 DIAGNOSIS — R928 Other abnormal and inconclusive findings on diagnostic imaging of breast: Secondary | ICD-10-CM

## 2015-12-04 DIAGNOSIS — E039 Hypothyroidism, unspecified: Secondary | ICD-10-CM | POA: Diagnosis not present

## 2015-12-04 DIAGNOSIS — I10 Essential (primary) hypertension: Secondary | ICD-10-CM | POA: Diagnosis not present

## 2015-12-04 DIAGNOSIS — E785 Hyperlipidemia, unspecified: Secondary | ICD-10-CM | POA: Diagnosis not present

## 2015-12-04 DIAGNOSIS — M1990 Unspecified osteoarthritis, unspecified site: Secondary | ICD-10-CM | POA: Diagnosis not present

## 2015-12-04 DIAGNOSIS — I2510 Atherosclerotic heart disease of native coronary artery without angina pectoris: Secondary | ICD-10-CM | POA: Diagnosis not present

## 2015-12-05 ENCOUNTER — Telehealth: Payer: Self-pay | Admitting: Nurse Practitioner

## 2015-12-05 NOTE — Telephone Encounter (Signed)
Stp and she states she had some blood drawn at her gyno and her kidney function was impaired. Our last creatinine level wnl, advised pt to have a copy of the results sent to [place]. Pt voiced understanding.

## 2015-12-10 ENCOUNTER — Ambulatory Visit
Admission: RE | Admit: 2015-12-10 | Discharge: 2015-12-10 | Disposition: A | Discharge status: Home / Self Care | Payer: Medicare Other | Source: Ambulatory Visit | Attending: Obstetrics and Gynecology | Admitting: Obstetrics and Gynecology

## 2015-12-10 DIAGNOSIS — R928 Other abnormal and inconclusive findings on diagnostic imaging of breast: Secondary | ICD-10-CM

## 2015-12-29 ENCOUNTER — Other Ambulatory Visit: Payer: Self-pay | Admitting: Nurse Practitioner

## 2015-12-30 ENCOUNTER — Other Ambulatory Visit: Payer: Self-pay | Admitting: Nurse Practitioner

## 2015-12-30 ENCOUNTER — Other Ambulatory Visit: Payer: Medicare Other

## 2015-12-30 DIAGNOSIS — E785 Hyperlipidemia, unspecified: Secondary | ICD-10-CM | POA: Diagnosis not present

## 2015-12-30 DIAGNOSIS — E039 Hypothyroidism, unspecified: Secondary | ICD-10-CM | POA: Diagnosis not present

## 2015-12-30 DIAGNOSIS — I519 Heart disease, unspecified: Secondary | ICD-10-CM

## 2015-12-30 DIAGNOSIS — I10 Essential (primary) hypertension: Secondary | ICD-10-CM | POA: Diagnosis not present

## 2015-12-31 ENCOUNTER — Other Ambulatory Visit: Payer: Self-pay | Admitting: Internal Medicine

## 2015-12-31 LAB — BMP8+EGFR
BUN/Creatinine Ratio: 17 (ref 11–26)
BUN: 20 mg/dL (ref 8–27)
CO2: 22 mmol/L (ref 18–29)
Calcium: 9.4 mg/dL (ref 8.7–10.3)
Chloride: 100 mmol/L (ref 96–106)
Creatinine, Ser: 1.15 mg/dL — ABNORMAL HIGH (ref 0.57–1.00)
GFR calc Af Amer: 51 mL/min/{1.73_m2} — ABNORMAL LOW (ref >59)
GFR, EST NON AFRICAN AMERICAN: 44 mL/min/{1.73_m2} — AB (ref >59)
Glucose: 99 mg/dL (ref 65–99)
POTASSIUM: 5.2 mmol/L (ref 3.5–5.2)
SODIUM: 137 mmol/L (ref 134–144)

## 2015-12-31 LAB — LIPID PANEL
CHOLESTEROL TOTAL: 187 mg/dL (ref 100–199)
Chol/HDL Ratio: 3.7 ratio units (ref 0.0–4.4)
HDL: 50 mg/dL (ref >39)
LDL Calculated: 113 mg/dL — ABNORMAL HIGH (ref 0–99)
Triglycerides: 119 mg/dL (ref 0–149)
VLDL CHOLESTEROL CAL: 24 mg/dL (ref 5–40)

## 2015-12-31 LAB — TSH: TSH: 8.03 u[IU]/mL — ABNORMAL HIGH (ref 0.450–4.500)

## 2015-12-31 LAB — HEPATIC FUNCTION PANEL
ALBUMIN: 3.9 g/dL (ref 3.5–4.7)
ALK PHOS: 69 IU/L (ref 39–117)
ALT: 53 IU/L — ABNORMAL HIGH (ref 0–32)
AST: 42 IU/L — AB (ref 0–40)
BILIRUBIN TOTAL: 0.3 mg/dL (ref 0.0–1.2)
Bilirubin, Direct: 0.11 mg/dL (ref 0.00–0.40)
Total Protein: 5.8 g/dL — ABNORMAL LOW (ref 6.0–8.5)

## 2015-12-31 NOTE — Telephone Encounter (Signed)
I thought she stopped this so not now Can discuss at [place]

## 2015-12-31 NOTE — Telephone Encounter (Signed)
May I refill ? 

## 2016-01-01 ENCOUNTER — Ambulatory Visit (INDEPENDENT_AMBULATORY_CARE_PROVIDER_SITE_OTHER): Payer: Medicare Other | Admitting: Internal Medicine

## 2016-01-01 ENCOUNTER — Encounter: Payer: Self-pay | Admitting: Internal Medicine

## 2016-01-01 VITALS — BP 114/60 | HR 64 | Ht 61.0 in | Wt 154.0 lb

## 2016-01-01 DIAGNOSIS — K3184 Gastroparesis: Secondary | ICD-10-CM | POA: Diagnosis not present

## 2016-01-01 DIAGNOSIS — K648 Other hemorrhoids: Secondary | ICD-10-CM | POA: Diagnosis not present

## 2016-01-01 MED ORDER — BETHANECHOL CHLORIDE 10 MG PO TABS: 20.0000 mg | ORAL_TABLET | Freq: Every evening | ORAL | Status: DC | PRN

## 2016-01-01 NOTE — Patient Instructions (Signed)
   Hope things get better some more.  I refilled the bethanechol. Use the erythromycin and that as needed.  I appreciate the opportunity to care for you. Gatha Mayer, MD, [facility]

## 2016-01-01 NOTE — Progress Notes (Signed)
   Subjective:    Patient ID: Erica [name], female    DOB: [birth year], 82 y.o.   MRN: [id number] Cc: f/u gastroparesis HPI [name] is here. She has gastroparesis and some irritable bowel syndrome problems. She was seen by primary care, and Dr. Livia Snellen started erythromycin to try to help her gastroparesis. Using erythromycin perhaps once a day and seems better. Uses bethanechol 20 mg at bedtime intermittently. Still grieving over son's death but better - still cries everyday things were definitely worse when she was in the worse stages of her grief. Prune juice helps constipation She sees intermittent bright red blood on the tissue paper and is concerned about this. Wt Readings from Last 3 Encounters:  01/01/16 154 lb (69.854 kg)  11/17/15 154 lb (69.854 kg)  11/05/15 152 lb 9.6 oz (69.219 kg)   Medications, allergies, past medical history, past surgical history, family history and social history are reviewed and updated in the EMR.  Review of Systems As above    Objective:   Physical Exam BP 114/60 mmHg  Pulse 64  Ht 5\' 1"  (1.549 m)  Wt 154 lb (69.854 kg)  BMI 29.11 kg/m2 Abd soft and nontender, no splash  [name], CMA - present  Rectal - no mass - NL anoderm Anoscopy - Gr 1 internal hemorrhoids all positions       Assessment & Plan:   Gastroparesis Seems better at this point. She says she's not is hungry so she is eating a little bit less. Erythromycin did help, I think that was a smart thing to do though taking chronically probably will help. She is using it as needed which is fine. I think there is little downside to it most likely. She will return as needed, or in 6 months.  Internal bleeding hemorrhoids She should continue user prune juice to treat her constipation and I think these will calm down. They're not bleeding a lot but worried her. She is reassured with the more accurate diagnosis. I would not pursue a colonoscopy for her rectal bleeding. I explained  the look out for signs of darker red blood or more profuse bleeding and let me know as that could mean a need for a colonoscopy. She last had one in 2015.

## 2016-01-01 NOTE — Assessment & Plan Note (Signed)
She should continue user prune juice to treat her constipation and I think these will calm down. They're not bleeding a lot but worried her. She is reassured with the more accurate diagnosis. I would not pursue a colonoscopy for her rectal bleeding. I explained the look out for signs of darker red blood or more profuse bleeding and let me know as that could mean a need for a colonoscopy. She last had one in 2015.

## 2016-01-01 NOTE — Assessment & Plan Note (Signed)
Seems better at this point. She says she's not is hungry so she is eating a little bit less. Erythromycin did help, I think that was a smart thing to do though taking chronically probably will help. She is using it as needed which is fine. I think there is little downside to it most likely. She will return as needed, or in 6 months.

## 2016-01-09 ENCOUNTER — Telehealth: Payer: Self-pay | Admitting: Nurse Practitioner

## 2016-01-31 ENCOUNTER — Encounter (HOSPITAL_COMMUNITY): Payer: Self-pay | Admitting: Emergency Medicine

## 2016-01-31 ENCOUNTER — Emergency Department (HOSPITAL_COMMUNITY): Payer: Medicare Other

## 2016-01-31 ENCOUNTER — Observation Stay (HOSPITAL_COMMUNITY)
Admission: EM | Admit: 2016-01-31 | Discharge: 2016-02-04 | Disposition: A | Discharge status: Home / Self Care | Payer: Medicare Other | Attending: Internal Medicine | Admitting: Internal Medicine

## 2016-01-31 DIAGNOSIS — F32A Depression, unspecified: Secondary | ICD-10-CM | POA: Diagnosis present

## 2016-01-31 DIAGNOSIS — K219 Gastro-esophageal reflux disease without esophagitis: Secondary | ICD-10-CM | POA: Diagnosis present

## 2016-01-31 DIAGNOSIS — Z91041 Radiographic dye allergy status: Secondary | ICD-10-CM | POA: Diagnosis not present

## 2016-01-31 DIAGNOSIS — F419 Anxiety disorder, unspecified: Secondary | ICD-10-CM | POA: Diagnosis not present

## 2016-01-31 DIAGNOSIS — I2510 Atherosclerotic heart disease of native coronary artery without angina pectoris: Secondary | ICD-10-CM | POA: Diagnosis not present

## 2016-01-31 DIAGNOSIS — F329 Major depressive disorder, single episode, unspecified: Secondary | ICD-10-CM | POA: Diagnosis not present

## 2016-01-31 DIAGNOSIS — E039 Hypothyroidism, unspecified: Secondary | ICD-10-CM | POA: Diagnosis not present

## 2016-01-31 DIAGNOSIS — R748 Abnormal levels of other serum enzymes: Secondary | ICD-10-CM | POA: Diagnosis present

## 2016-01-31 DIAGNOSIS — E860 Dehydration: Secondary | ICD-10-CM | POA: Diagnosis present

## 2016-01-31 DIAGNOSIS — K3184 Gastroparesis: Secondary | ICD-10-CM | POA: Diagnosis present

## 2016-01-31 DIAGNOSIS — R079 Chest pain, unspecified: Secondary | ICD-10-CM | POA: Diagnosis present

## 2016-01-31 DIAGNOSIS — R7989 Other specified abnormal findings of blood chemistry: Secondary | ICD-10-CM | POA: Diagnosis not present

## 2016-01-31 DIAGNOSIS — N183 Chronic kidney disease, stage 3 (moderate): Secondary | ICD-10-CM | POA: Insufficient documentation

## 2016-01-31 DIAGNOSIS — I480 Paroxysmal atrial fibrillation: Secondary | ICD-10-CM | POA: Insufficient documentation

## 2016-01-31 DIAGNOSIS — N179 Acute kidney failure, unspecified: Secondary | ICD-10-CM | POA: Diagnosis not present

## 2016-01-31 DIAGNOSIS — Z8673 Personal history of transient ischemic attack (TIA), and cerebral infarction without residual deficits: Secondary | ICD-10-CM | POA: Diagnosis not present

## 2016-01-31 DIAGNOSIS — R531 Weakness: Secondary | ICD-10-CM | POA: Diagnosis not present

## 2016-01-31 DIAGNOSIS — Z7982 Long term (current) use of aspirin: Secondary | ICD-10-CM | POA: Diagnosis not present

## 2016-01-31 DIAGNOSIS — R42 Dizziness and giddiness: Secondary | ICD-10-CM | POA: Diagnosis not present

## 2016-01-31 DIAGNOSIS — E785 Hyperlipidemia, unspecified: Secondary | ICD-10-CM | POA: Diagnosis present

## 2016-01-31 DIAGNOSIS — R0789 Other chest pain: Principal | ICD-10-CM | POA: Diagnosis present

## 2016-01-31 DIAGNOSIS — R110 Nausea: Secondary | ICD-10-CM | POA: Diagnosis not present

## 2016-01-31 DIAGNOSIS — I129 Hypertensive chronic kidney disease with stage 1 through stage 4 chronic kidney disease, or unspecified chronic kidney disease: Secondary | ICD-10-CM | POA: Insufficient documentation

## 2016-01-31 DIAGNOSIS — I10 Essential (primary) hypertension: Secondary | ICD-10-CM | POA: Diagnosis present

## 2016-01-31 LAB — CBC WITH DIFFERENTIAL/PLATELET
BASOS ABS: 0 10*3/uL (ref 0.0–0.1)
Basophils Relative: 0 %
EOS PCT: 1 %
Eosinophils Absolute: 0.1 10*3/uL (ref 0.0–0.7)
HEMATOCRIT: 35.3 % — AB (ref 36.0–46.0)
Hemoglobin: 12.1 g/dL (ref 12.0–15.0)
LYMPHS ABS: 2.3 10*3/uL (ref 0.7–4.0)
LYMPHS PCT: 24 %
MCH: 31.8 pg (ref 26.0–34.0)
MCHC: 34.3 g/dL (ref 30.0–36.0)
MCV: 92.7 fL (ref 78.0–100.0)
MONO ABS: 1.2 10*3/uL — AB (ref 0.1–1.0)
MONOS PCT: 13 %
Neutro Abs: 6 10*3/uL (ref 1.7–7.7)
Neutrophils Relative %: 62 %
PLATELETS: 190 10*3/uL (ref 150–400)
RBC: 3.81 MIL/uL — ABNORMAL LOW (ref 3.87–5.11)
RDW: 13.2 % (ref 11.5–15.5)
WBC: 9.6 10*3/uL (ref 4.0–10.5)

## 2016-01-31 LAB — BASIC METABOLIC PANEL
Anion gap: 12 (ref 5–15)
BUN: 31 mg/dL — AB (ref 6–20)
CHLORIDE: 104 mmol/L (ref 101–111)
CO2: 19 mmol/L — AB (ref 22–32)
CREATININE: 1.49 mg/dL — AB (ref 0.44–1.00)
Calcium: 9.4 mg/dL (ref 8.9–10.3)
GFR calc Af Amer: 36 mL/min — ABNORMAL LOW (ref >60)
GFR calc non Af Amer: 31 mL/min — ABNORMAL LOW (ref >60)
GLUCOSE: 128 mg/dL — AB (ref 65–99)
Potassium: 4.9 mmol/L (ref 3.5–5.1)
Sodium: 135 mmol/L (ref 135–145)

## 2016-01-31 LAB — I-STAT TROPONIN, ED: Troponin i, poc: 0 ng/mL (ref 0.00–0.08)

## 2016-01-31 MED ORDER — ONDANSETRON HCL 4 MG/2ML IJ SOLN
4.0000 mg | Freq: Once | INTRAMUSCULAR | Status: AC
  Administered 2016-01-31: 4 mg via INTRAVENOUS

## 2016-01-31 MED ORDER — SODIUM CHLORIDE 0.9 % IV BOLUS (SEPSIS)
1000.0000 mL | Freq: Once | INTRAVENOUS | Status: AC
  Administered 2016-01-31: 1000 mL via INTRAVENOUS

## 2016-01-31 NOTE — ED Notes (Signed)
Pt arrives with chest pressure and weakness ongoing x1 month, states tonight started feeling nauseated. VSS, EKG shows BBB. Hx HTN.  Pt appears to also be depressed d/t loss of child in September. States she's still dealing with the grief from this event.

## 2016-02-01 DIAGNOSIS — I119 Hypertensive heart disease without heart failure: Secondary | ICD-10-CM | POA: Diagnosis not present

## 2016-02-01 DIAGNOSIS — E860 Dehydration: Secondary | ICD-10-CM | POA: Diagnosis not present

## 2016-02-01 DIAGNOSIS — E785 Hyperlipidemia, unspecified: Secondary | ICD-10-CM | POA: Diagnosis not present

## 2016-02-01 DIAGNOSIS — R0789 Other chest pain: Secondary | ICD-10-CM | POA: Diagnosis not present

## 2016-02-01 DIAGNOSIS — R079 Chest pain, unspecified: Secondary | ICD-10-CM | POA: Diagnosis present

## 2016-02-01 DIAGNOSIS — N179 Acute kidney failure, unspecified: Secondary | ICD-10-CM | POA: Diagnosis present

## 2016-02-01 DIAGNOSIS — E039 Hypothyroidism, unspecified: Secondary | ICD-10-CM | POA: Diagnosis not present

## 2016-02-01 DIAGNOSIS — I480 Paroxysmal atrial fibrillation: Secondary | ICD-10-CM | POA: Diagnosis not present

## 2016-02-01 LAB — URINALYSIS, ROUTINE W REFLEX MICROSCOPIC
BILIRUBIN URINE: NEGATIVE
Glucose, UA: NEGATIVE mg/dL
KETONES UR: NEGATIVE mg/dL
Nitrite: NEGATIVE
PH: 7.5 (ref 5.0–8.0)
Protein, ur: NEGATIVE mg/dL
SPECIFIC GRAVITY, URINE: 1.009 (ref 1.005–1.030)

## 2016-02-01 LAB — CBC WITH DIFFERENTIAL/PLATELET
Basophils Absolute: 0.1 10*3/uL (ref 0.0–0.1)
Basophils Relative: 1 %
EOS PCT: 1 %
Eosinophils Absolute: 0.1 10*3/uL (ref 0.0–0.7)
HCT: 35.1 % — ABNORMAL LOW (ref 36.0–46.0)
Hemoglobin: 12 g/dL (ref 12.0–15.0)
LYMPHS ABS: 2 10*3/uL (ref 0.7–4.0)
LYMPHS PCT: 25 %
MCH: 32 pg (ref 26.0–34.0)
MCHC: 34.2 g/dL (ref 30.0–36.0)
MCV: 93.6 fL (ref 78.0–100.0)
Monocytes Absolute: 0.7 10*3/uL (ref 0.1–1.0)
Monocytes Relative: 8 %
Neutro Abs: 5.1 10*3/uL (ref 1.7–7.7)
Neutrophils Relative %: 65 %
Platelets: 191 10*3/uL (ref 150–400)
RBC: 3.75 MIL/uL — AB (ref 3.87–5.11)
RDW: 13.4 % (ref 11.5–15.5)
WBC: 7.9 10*3/uL (ref 4.0–10.5)

## 2016-02-01 LAB — URINE MICROSCOPIC-ADD ON
RBC / HPF: NONE SEEN RBC/hpf (ref 0–5)
WBC, UA: NONE SEEN WBC/hpf (ref 0–5)

## 2016-02-01 LAB — CREATININE, SERUM
CREATININE: 1.33 mg/dL — AB (ref 0.44–1.00)
GFR, EST AFRICAN AMERICAN: 42 mL/min — AB (ref >60)
GFR, EST NON AFRICAN AMERICAN: 36 mL/min — AB (ref >60)

## 2016-02-01 LAB — COMPREHENSIVE METABOLIC PANEL
ALBUMIN: 3.2 g/dL — AB (ref 3.5–5.0)
ALT: 84 U/L — ABNORMAL HIGH (ref 14–54)
AST: 55 U/L — AB (ref 15–41)
Alkaline Phosphatase: 59 U/L (ref 38–126)
Anion gap: 9 (ref 5–15)
BUN: 19 mg/dL (ref 6–20)
CHLORIDE: 105 mmol/L (ref 101–111)
CO2: 21 mmol/L — ABNORMAL LOW (ref 22–32)
Calcium: 8.8 mg/dL — ABNORMAL LOW (ref 8.9–10.3)
Creatinine, Ser: 1.14 mg/dL — ABNORMAL HIGH (ref 0.44–1.00)
GFR calc Af Amer: 50 mL/min — ABNORMAL LOW (ref >60)
GFR, EST NON AFRICAN AMERICAN: 43 mL/min — AB (ref >60)
GLUCOSE: 111 mg/dL — AB (ref 65–99)
POTASSIUM: 4.4 mmol/L (ref 3.5–5.1)
SODIUM: 135 mmol/L (ref 135–145)
Total Bilirubin: 0.4 mg/dL (ref 0.3–1.2)
Total Protein: 5.7 g/dL — ABNORMAL LOW (ref 6.5–8.1)

## 2016-02-01 LAB — CBC
HEMATOCRIT: 35.6 % — AB (ref 36.0–46.0)
HEMOGLOBIN: 12.2 g/dL (ref 12.0–15.0)
MCH: 31.6 pg (ref 26.0–34.0)
MCHC: 34.3 g/dL (ref 30.0–36.0)
MCV: 92.2 fL (ref 78.0–100.0)
Platelets: 186 10*3/uL (ref 150–400)
RBC: 3.86 MIL/uL — AB (ref 3.87–5.11)
RDW: 13.3 % (ref 11.5–15.5)
WBC: 8 10*3/uL (ref 4.0–10.5)

## 2016-02-01 LAB — TROPONIN I

## 2016-02-01 LAB — PROTEIN, URINE, RANDOM

## 2016-02-01 LAB — CREATININE, URINE, RANDOM: Creatinine, Urine: 12.67 mg/dL

## 2016-02-01 LAB — POTASSIUM, URINE RANDOM: POTASSIUM UR: 17 mmol/L

## 2016-02-01 LAB — T4, FREE: Free T4: 1.72 ng/dL — ABNORMAL HIGH (ref 0.61–1.12)

## 2016-02-01 LAB — TSH: TSH: 8.081 u[IU]/mL — ABNORMAL HIGH (ref 0.350–4.500)

## 2016-02-01 LAB — PHOSPHORUS: PHOSPHORUS: 3.1 mg/dL (ref 2.5–4.6)

## 2016-02-01 LAB — SODIUM, URINE, RANDOM: Sodium, Ur: 98 mmol/L

## 2016-02-01 LAB — MAGNESIUM: MAGNESIUM: 2.3 mg/dL (ref 1.7–2.4)

## 2016-02-01 LAB — HEPARIN LEVEL (UNFRACTIONATED): Heparin Unfractionated: 1.08 IU/mL — ABNORMAL HIGH (ref 0.30–0.70)

## 2016-02-01 MED ORDER — ALPRAZOLAM 0.25 MG PO TABS
0.2500 mg | ORAL_TABLET | Freq: Every evening | ORAL | Status: DC | PRN
  Administered 2016-02-01 – 2016-02-03 (×4): 0.25 mg via ORAL
  Filled 2016-02-01 (×4): qty 1

## 2016-02-01 MED ORDER — ACETAMINOPHEN 325 MG PO TABS: 650.0000 mg | ORAL_TABLET | ORAL | Status: DC | PRN

## 2016-02-01 MED ORDER — ONDANSETRON HCL 4 MG/2ML IJ SOLN: 4.0000 mg | Freq: Four times a day (QID) | INTRAMUSCULAR | Status: DC | PRN

## 2016-02-01 MED ORDER — AMLODIPINE BESYLATE 5 MG PO TABS
5.0000 mg | ORAL_TABLET | Freq: Every day | ORAL | Status: DC
  Administered 2016-02-01 – 2016-02-03 (×3): 5 mg via ORAL
  Filled 2016-02-01: qty 2
  Filled 2016-02-01 (×3): qty 1

## 2016-02-01 MED ORDER — ALUM & MAG HYDROXIDE-SIMETH 200-200-20 MG/5ML PO SUSP
30.0000 mL | ORAL | Status: DC | PRN
  Administered 2016-02-01 – 2016-02-02 (×3): 30 mL via ORAL
  Filled 2016-02-01 (×3): qty 30

## 2016-02-01 MED ORDER — PANTOPRAZOLE SODIUM 40 MG IV SOLR
40.0000 mg | Freq: Once | INTRAVENOUS | Status: AC
  Administered 2016-02-01: 40 mg via INTRAVENOUS
  Filled 2016-02-01: qty 40

## 2016-02-01 MED ORDER — ASPIRIN EC 81 MG PO TBEC
81.0000 mg | DELAYED_RELEASE_TABLET | Freq: Every day | ORAL | Status: DC
  Administered 2016-02-01 – 2016-02-04 (×4): 81 mg via ORAL
  Filled 2016-02-01 (×4): qty 1

## 2016-02-01 MED ORDER — GI COCKTAIL ~~LOC~~
30.0000 mL | Freq: Once | ORAL | Status: AC
  Administered 2016-02-01: 30 mL via ORAL
  Filled 2016-02-01: qty 30

## 2016-02-01 MED ORDER — HEPARIN (PORCINE) IN NACL 100-0.45 UNIT/ML-% IJ SOLN
600.0000 [IU]/h | INTRAMUSCULAR | Status: DC
  Administered 2016-02-01 – 2016-02-02 (×2): 600 [IU]/h via INTRAVENOUS
  Filled 2016-02-01: qty 250

## 2016-02-01 MED ORDER — HYDROXYZINE HCL 25 MG PO TABS
25.0000 mg | ORAL_TABLET | ORAL | Status: DC | PRN
Start: 2016-02-01 — End: 2016-02-04

## 2016-02-01 MED ORDER — MORPHINE SULFATE (PF) 2 MG/ML IV SOLN: 2.0000 mg | INTRAVENOUS | Status: DC | PRN

## 2016-02-01 MED ORDER — ESCITALOPRAM OXALATE 10 MG PO TABS
40.0000 mg | ORAL_TABLET | Freq: Every day | ORAL | Status: DC
  Administered 2016-02-01 – 2016-02-04 (×3): 40 mg via ORAL
  Filled 2016-02-01 (×6): qty 4

## 2016-02-01 MED ORDER — PANTOPRAZOLE SODIUM 40 MG PO TBEC
40.0000 mg | DELAYED_RELEASE_TABLET | Freq: Two times a day (BID) | ORAL | Status: DC
  Administered 2016-02-01 – 2016-02-04 (×7): 40 mg via ORAL
  Filled 2016-02-01 (×7): qty 1

## 2016-02-01 MED ORDER — LEVOTHYROXINE SODIUM 88 MCG PO TABS
88.0000 ug | ORAL_TABLET | Freq: Every day | ORAL | Status: DC
  Administered 2016-02-01 – 2016-02-04 (×4): 88 ug via ORAL
  Filled 2016-02-01 (×5): qty 1

## 2016-02-01 MED ORDER — PANTOPRAZOLE SODIUM 40 MG PO TBEC: 40.0000 mg | DELAYED_RELEASE_TABLET | Freq: Every day | ORAL | Status: DC

## 2016-02-01 MED ORDER — ENOXAPARIN SODIUM 30 MG/0.3ML ~~LOC~~ SOLN
30.0000 mg | SUBCUTANEOUS | Status: DC
  Administered 2016-02-01: 30 mg via SUBCUTANEOUS
  Filled 2016-02-01: qty 0.3

## 2016-02-01 MED ORDER — BETHANECHOL CHLORIDE 10 MG PO TABS
20.0000 mg | ORAL_TABLET | Freq: Every evening | ORAL | Status: DC | PRN
  Filled 2016-02-01 (×2): qty 2

## 2016-02-01 MED ORDER — SODIUM CHLORIDE 0.9% FLUSH
3.0000 mL | Freq: Two times a day (BID) | INTRAVENOUS | Status: DC
  Administered 2016-02-01 – 2016-02-03 (×2): 3 mL via INTRAVENOUS

## 2016-02-01 MED ORDER — HEPARIN (PORCINE) IN NACL 100-0.45 UNIT/ML-% IJ SOLN
900.0000 [IU]/h | INTRAMUSCULAR | Status: DC
  Administered 2016-02-01: 900 [IU]/h via INTRAVENOUS
  Filled 2016-02-01: qty 250

## 2016-02-01 MED ORDER — SODIUM CHLORIDE 0.9 % IV SOLN
INTRAVENOUS | Status: AC
  Administered 2016-02-01: 03:00:00 via INTRAVENOUS

## 2016-02-01 MED ORDER — AMIODARONE HCL 200 MG PO TABS
200.0000 mg | ORAL_TABLET | Freq: Every day | ORAL | Status: DC
  Administered 2016-02-01 – 2016-02-03 (×3): 200 mg via ORAL
  Filled 2016-02-01 (×4): qty 1

## 2016-02-01 MED ORDER — ERYTHROMYCIN BASE 250 MG PO TABS
500.0000 mg | ORAL_TABLET | Freq: Three times a day (TID) | ORAL | Status: DC
  Administered 2016-02-01 – 2016-02-04 (×9): 500 mg via ORAL
  Filled 2016-02-01 (×17): qty 2

## 2016-02-01 MED ORDER — ALPRAZOLAM 0.25 MG PO TABS: 0.2500 mg | ORAL_TABLET | Freq: Every evening | ORAL | Status: DC | PRN

## 2016-02-01 MED ORDER — HEPARIN BOLUS VIA INFUSION
3000.0000 [IU] | Freq: Once | INTRAVENOUS | Status: AC
  Administered 2016-02-01: 3000 [IU] via INTRAVENOUS
  Filled 2016-02-01: qty 3000

## 2016-02-01 MED ORDER — NITROGLYCERIN 0.4 MG SL SUBL: 0.4000 mg | SUBLINGUAL_TABLET | SUBLINGUAL | Status: DC | PRN

## 2016-02-01 NOTE — Care Management Obs Status (Signed)
[place] NOTIFICATION   Patient Details  Name: Erica [name] MRN: [id number] Date of Birth: [birth year]   Medicare Observation Status Notification Given:  Yes    Apolonio Schneiders, RN 02/01/2016, 10:45 AM

## 2016-02-01 NOTE — Progress Notes (Signed)
Patient admitted after midnight. Chart reviewed. Patient examined. Has h/o gastroparesis, GERD. Was nauseated yesterday. CP is substernal, constant 1/10 now. troponins neg. Suspect GI etiol. Will try diagnostic GI coctail, increase PPI to BID. Reasonable to continue rule out and check echo. If no change from previous, may not need further ischemic w/u.  Doree Barthel, MD [facility] [email or web address] password [facility]

## 2016-02-01 NOTE — Progress Notes (Signed)
ANTICOAGULATION CONSULT NOTE - Initial Consult  Pharmacy Consult for Heparin Indication: atrial fibrillation  Allergies  Allergen Reactions  . Contrast Media [Iodinated Diagnostic Agents] Shortness Of Breath, Nausea Only and Swelling  . Iodine Shortness Of Breath, Nausea Only and Swelling  . Iohexol Hives and Swelling     Desc: hives, swelling over 5 yrs ago-pt has never been pre-medicated--was told to not have iv contrast ever   . Metoclopramide Other (See Comments)    Mouth tremors, insomnia, and irritability    Patient Measurements: Height: 5\' 2"  (157.5 cm) Weight: 148 lb (67.132 kg) IBW/kg (Calculated) : 50.1 Heparin Dosing Weight: 64 kg  Vital Signs: Temp: 97.9 F (36.6 C) (02/05 0829) Temp Source: Oral (02/05 0829) BP: 121/50 mmHg (02/05 0829) Pulse Rate: 58 (02/05 0829)  Labs:  Recent Labs  01/31/16 2230 02/01/16 0338 02/01/16 0947  HGB 12.1 12.2 12.0  HCT 35.3* 35.6* 35.1*  PLT 190 186 191  CREATININE 1.49* 1.33* 1.14*  TROPONINI  --  <0.03 <0.03    Estimated Creatinine Clearance: 33.6 mL/min (by C-G formula based on Cr of 1.14).   Medical History: Past Medical History  Diagnosis Date  . MVP (mitral valve prolapse)   . Atrial fibrillation ([place])   . Hyperlipidemia   . Hypothyroid   . GERD (gastroesophageal reflux disease)   . Gastroparesis   . Hypertension   . Chronic back pain   . Adenomatous rectal polyp   . Bleeding internal hemorrhoids   . Lichen sclerosus et atrophicus of the vulva   . Diverticulosis of colon (without mention of hemorrhage)   . Hiatal hernia 09/1999    3cm  . TIA (transient ischemic attack)   . Ischemic colitis ([place])     Medications:  Scheduled:  . amiodarone  200 mg Oral Daily  . amLODipine  5 mg Oral Daily  . aspirin EC  81 mg Oral Daily  . erythromycin  500 mg Oral TID AC  . escitalopram  40 mg Oral Daily  . levothyroxine  88 mcg Oral QAC breakfast  . pantoprazole  40 mg Oral BID  . sodium chloride flush  3 mL  Intravenous Q12H   Infusions:  . sodium chloride 100 mL/hr at 02/01/16 0239   PRN: acetaminophen, ALPRAZolam, bethanechol, hydrOXYzine, ondansetron (ZOFRAN) IV  Assessment: 89 YOF with PMH of CAD, Afib, HTN, HLD, and Gastroparesis presented with chest pressure and weakness.  Pharmacy is consulted to dose heparin for Afib.  CHADSVASC = 6 No Anticoagulation PTA Last dose ppx lovenox 02/01/16 0900.  Hgb 12, Plts 191 Trop <0.03  Goal of Therapy:  Heparin level 0.3-0.7 units/ml Monitor platelets by anticoagulation protocol: Yes   Plan:  Give 3000 units bolus x 1 Start heparin infusion at 900 units/hr Check anti-Xa level in 8 hours and daily while on heparin Continue to monitor H&H and platelets  Bennye Alm, PharmD Pharmacy Resident [phone] 02/01/2016,11:21 AM

## 2016-02-01 NOTE — H&P (Signed)
[facility] History and Physical  Erica [name] [id number] DOB: 1930-06-04 DOA: 01/31/2016  Referring physician: Jule Economy, MD  PCP: [name], [name]   Primary cardiologist Dr. Terrence Dupont.  Chief Complaint: Chest pressure and weakness.  HPI: Erica [name] is a 82 y.o. female with a past medical history of GERD, gastroparesis, diverticulosis, hemorrhoids, history of ischemic colitis, MVP, paroxysmal atrial fibrillation, hyperlipidemia, hypertension, hypothyroidism, chronic back pain who is coming to the emergency department due to having chest pressure since earlier today and weakness for about a month.  Patient stated her her chest pressure is an aching pain, mostly a constant pain, which she rates at 3/10 and the pain scale, non radiated, not associated with exertion, without worsening or relieving factors. She has had some mild dyspnea, palpitations, nausea and gets occasional lower extremity pitting edema, but the symptoms have happened prior to her chest pressure and may not be related to it.  She also complains of fatigue, insomnia, decreased appetite and oral intake. She has been having a very difficult time grieving the loss of her son in September of last year.  When seen, the patient was seen no acute distress but looked tired and sad. Workup in the ER is significant for worsening BUN/creatinine and EKG shows a LBBB, also present when compared to previous EKG done in May to 2015.   Review of Systems:  Constitutional:  Positive fatigue.  No weight loss, night sweats, Fevers, chills. HEENT:  No headaches, Difficulty swallowing,Tooth/dental problems,Sore throat,  No sneezing, itching, ear ache, nasal congestion, post nasal drip,  Cardio-vascular:  Positive chest pressure, palpitations and mild dyspnea. Denies Orthopnea, PND, swelling in lower extremities, anasarca, dizziness.  GI:  Positive nausea, loss of appetite. No heartburn, indigestion, abdominal  pain,  vomiting, diarrhea, change in bowel habits.  Resp:  Occasional mild dyspnea. Denies productive cough, wheezing or hemoptysis. Skin:  No rash or lesions.  GU:  No dysuria, change in color of urine, no urgency or frequency. No flank pain.  Musculoskeletal:  No joint pain or swelling. No decreased range of motion. Occasional back pain.  Psych:  Positive depression, insomnia and anxiety. Having a difficult time due to the loss of her son in September last year.   Past Medical History  Diagnosis Date  . MVP (mitral valve prolapse)   . Atrial fibrillation ([place])   . Hyperlipidemia   . Hypothyroid   . GERD (gastroesophageal reflux disease)   . Gastroparesis   . Hypertension   . Chronic back pain   . Adenomatous rectal polyp   . Bleeding internal hemorrhoids   . Lichen sclerosus et atrophicus of the vulva   . Diverticulosis of colon (without mention of hemorrhage)   . Hiatal hernia 09/1999    3cm  . TIA (transient ischemic attack)   . Ischemic colitis [facility])    Past Surgical History  Procedure Laterality Date  . Cataract extraction    . Upper gastrointestinal endoscopy  08/23/2011    Normal  . Sigmoidoscopy  08/23/2011  . Tubal ligation    . Cholecystectomy    . Left heart catheterization with coronary angiogram N/A 07/17/2013    Procedure: LEFT HEART CATHETERIZATION WITH CORONARY ANGIOGRAM;  Surgeon: Clent Demark, MD;  Location: [facility] CATH LAB;  Service: Cardiovascular;  Laterality: N/A;  . Colonoscopy      Multiple, polyps   Social History:  reports that she has never smoked. She has never used smokeless tobacco. She reports that she does not  drink alcohol or use illicit drugs.  Allergies  Allergen Reactions  . Contrast Media [Iodinated Diagnostic Agents] Shortness Of Breath, Nausea Only and Swelling  . Iodine Shortness Of Breath, Nausea Only and Swelling  . Iohexol Hives and Swelling     Desc: hives, swelling over 5 yrs ago-pt has never been pre-medicated--was told  to not have iv contrast ever   . Metoclopramide Other (See Comments)    Mouth tremors, insomnia, and irritability    Family History  Problem Relation Age of Onset  . Stomach cancer Brother   . Colon cancer Paternal Aunt   . Lung cancer Brother     smoker  . Esophageal cancer Father     Prior to Admission medications   Medication Sig Start Date End Date Taking? Authorizing Provider  ALPRAZolam Duanne Moron) 0.5 MG tablet Take 0.25 mg by mouth at bedtime as needed for anxiety.  09/11/15  Yes Historical Provider, MD  amiodarone (PACERONE) 200 MG tablet Take 1 tablet (200 mg total) by mouth daily. 11/16/13  Yes Mary-Margaret Hassell Done, [name]  amLODipine (NORVASC) 5 MG tablet TAKE 1 TABLET (5 MG TOTAL) BY MOUTH DAILY. 12/30/15  Yes Mary-Margaret Hassell Done, [name]  aspirin 81 MG tablet Take 81 mg by mouth daily.     Yes Historical Provider, MD  bethanechol (URECHOLINE) 10 MG tablet Take 2 tablets (20 mg total) by mouth at bedtime as needed. Patient taking differently: Take 20 mg by mouth at bedtime as needed (gastric problems).  01/01/16  Yes Gatha Mayer, MD  Cholecalciferol (VITAMIN D PO) Take 1 tablet by mouth daily.   Yes Historical Provider, MD  erythromycin (E-MYCIN) 500 MG tablet Take 30 min ac for gastric motility 11/05/15  Yes Claretta Fraise, MD  escitalopram (LEXAPRO) 20 MG tablet 2 po Qd Patient taking differently: Take 40 mg by mouth daily.  11/17/15  Yes Mary-Margaret Hassell Done, [name]  EVENING PRIMROSE OIL PO Take 1 capsule by mouth daily.   Yes Historical Provider, MD  furosemide (LASIX) 20 MG tablet Take 1 tablet (20 mg total) by mouth daily. Patient taking differently: Take 20 mg by mouth daily as needed for fluid or edema.  07/07/15  Yes Mary-Margaret Hassell Done, [name]  levothyroxine (SYNTHROID, LEVOTHROID) 88 MCG tablet Take 88 mcg by mouth daily before breakfast.   Yes Historical Provider, MD  Melatonin 5 MG CAPS Take 5 mg by mouth at bedtime as needed (sleep).   Yes Historical Provider, MD  Multiple  Vitamin (MULTIVITAMIN WITH MINERALS) TABS tablet Take 1 tablet by mouth daily.   Yes Historical Provider, MD  naproxen sodium (ANAPROX) 220 MG tablet Take 440 mg by mouth 2 (two) times daily as needed (pain).   Yes Historical Provider, MD  olmesartan (BENICAR) 40 MG tablet Take 1 tablet (40 mg total) by mouth daily. 07/07/15  Yes Mary-Margaret Hassell Done, [name]  Omega-3 Fatty Acids (FISH OIL PO) Take 1 tablet by mouth daily.   Yes Historical Provider, MD  omeprazole-sodium bicarbonate (ZEGERID) 40-1100 MG capsule TAKE 1 CAPSULE BY MOUTH TWICE A DAY BEFORE BREAKFAST AND AT BEDTIME 11/03/15  Yes Gatha Mayer, MD  ondansetron (ZOFRAN) 4 MG tablet Take 1 tablet (4 mg total) by mouth every 8 (eight) hours as needed for nausea or vomiting. 10/31/15  Yes Jessica D Zehr, PA-C  spironolactone (ALDACTONE) 25 MG tablet Take 1 tablet by mouth daily.  07/01/15  Yes Historical Provider, MD  VITAMIN E PO Take 5 drops by mouth daily.   Yes Historical Provider, MD  Physical Exam: Filed Vitals:   01/31/16 2245 02/01/16 0000 02/01/16 0045 02/01/16 0108  BP: 133/52 122/71 129/75 137/52  Pulse: 62 63 62 71  Temp:    97.4 F (36.3 C)  TempSrc:    Oral  Resp: 20  13 18   Height:    5\' 2"  (1.575 m)  Weight:    67.132 kg (148 lb)  SpO2: 99% 99% 100% 100%    Wt Readings from Last 3 Encounters:  02/01/16 67.132 kg (148 lb)  01/01/16 69.854 kg (154 lb)  11/17/15 69.854 kg (154 lb)    General:  Mildly anxious, but otherwise in no acute distress. Eyes: PERRL, normal lids, irises & conjunctiva ENT: grossly normal hearing, lips and oral mucosa are mildly dry. Neck: no LAD, masses or thyromegaly Cardiovascular: RRR, no m/r/g. No LE edema.  Telemetry: SR, no arrhythmias  Respiratory: CTA bilaterally, no w/r/r. Normal respiratory effort. Abdomen: BS positive, soft, ntnd Skin: no rash or induration seen on limited exam Musculoskeletal: Positive precordial and chest wall tenderness on palpation,                               grossly normal tone BUE/BLE Psychiatric: grossly normal mood and affect, speech fluent and appropriate Neurologic: Awake, alert, oriented 3, grossly non-focal.          Labs on Admission:  Basic Metabolic Panel:  Recent Labs Lab 01/31/16 2230  NA 135  K 4.9  CL 104  CO2 19*  GLUCOSE 128*  BUN 31*  CREATININE 1.49*  CALCIUM 9.4   CBC:  Recent Labs Lab 01/31/16 2230  WBC 9.6  NEUTROABS 6.0  HGB 12.1  HCT 35.3*  MCV 92.7  PLT 190    Radiological Exams on Admission: Dg Chest 2 View  01/31/2016  CLINICAL DATA:  Chest pain and nausea EXAM: CHEST  2 VIEW COMPARISON:  05/15/2014 FINDINGS: Normal heart size and mediastinal contours. No acute infiltrate or edema. No effusion or pneumothorax. No acute osseous findings. Cholecystectomy changes. IMPRESSION: No active cardiopulmonary disease. Electronically Signed   By: Monte Fantasia M.D.   On: 01/31/2016 23:31    EKG: Independently reviewed. Vent. rate 63 BPM PR interval 177 ms QRS duration 143 ms QT/QTc 485/496 ms P-R-T axes 0 73 75 Sinus rhythm IVCD, consider atypical LBBB  Assessment/Plan Principal Problem:   Chest pressure   Chest pain Admit to observation for telemetry monitoring. Trend cardiac biomarkers. Check echocardiogram in the morning.  Active Problems:   Gastroparesis Continue oral erythromycin before meals. Continue Zofran as needed for nausea. Trial of Vistaril 25 mg every 4 hours as needed for nausea and/or anxiety.    GERD (gastroesophageal reflux disease) Protonix 40 mg IVP 1 now. Protonix 40 mg by mouth daily.    AKI (acute kidney injury) ([place]) Continue IV hydration. Hold Benicar, furosemide and spironolactone. Check urine random sodium, potassium, protein, creatinine. Consider further workup or renal evaluation if no improvement is seen with hydration.    Dehydration As above. Continue IV hydration.    Depression Continue Lexapro 40 mg by mouth daily Continue alprazolam 0.25 mg  at bedtime when necessary Trial of Vistaril for anxiety and nausea.    HTN (hypertension) Continue amlodipine 5 mg by mouth daily Hold Benicar, furosemide and spironolactone due to increasing creatinine. Monitor blood pressure.    Hyperlipidemia Continue lifestyle modifications. Modifications and Omega-3 Fatty Acids Capsules. Continue Regular Outpatient Follow-Up.  Code Status: Full code. DVT Prophylaxis: Lovenox SQ. Family Communication: 2 of her sons were present in the room. Disposition Plan: Admit for observation, antiemetics, anxiolytics and troponin levels trending.  Time spent: Over 70 minutes were spent during the process of this admission.  Reubin Milan, M.D. [facility] Pager [phone].

## 2016-02-01 NOTE — ED Provider Notes (Signed)
CSN: [id number]     Arrival date & time 01/31/16  2212 History   First MD Initiated Contact with Patient 01/31/16 2219     Chief Complaint  Patient presents with  . Chest Pain     (Consider location/radiation/quality/duration/timing/severity/associated sxs/prior Treatment) HPI  62 y F w pmh afib, hl, hypothyroid, GERD, gastroparesis which is followed by GI.  Chronic pain.  She comes in today with some chest pressure and weakness.  For the past month she has been having generalized weakness, she also has persistent nausea, no vomiting which she attributes to her gastroparesis.  No fever/chills.  No diarrhea.  She has been having chest pain since earlier today that is chest pressure, mild (3/10).    Past Medical History  Diagnosis Date  . MVP (mitral valve prolapse)   . Atrial fibrillation ([place])   . Hyperlipidemia   . Hypothyroid   . GERD (gastroesophageal reflux disease)   . Gastroparesis   . Hypertension   . Chronic back pain   . Adenomatous rectal polyp   . Bleeding internal hemorrhoids   . Lichen sclerosus et atrophicus of the vulva   . Diverticulosis of colon (without mention of hemorrhage)   . Hiatal hernia 09/1999    3cm  . TIA (transient ischemic attack)   . Ischemic colitis [facility])    Past Surgical History  Procedure Laterality Date  . Cataract extraction    . Upper gastrointestinal endoscopy  08/23/2011    Normal  . Sigmoidoscopy  08/23/2011  . Tubal ligation    . Cholecystectomy    . Left heart catheterization with coronary angiogram N/A 07/17/2013    Procedure: LEFT HEART CATHETERIZATION WITH CORONARY ANGIOGRAM;  Surgeon: Clent Demark, MD;  Location: [facility] CATH LAB;  Service: Cardiovascular;  Laterality: N/A;  . Colonoscopy      Multiple, polyps   Family History  Problem Relation Age of Onset  . Stomach cancer Brother   . Colon cancer Paternal Aunt   . Lung cancer Brother     smoker  . Esophageal cancer Father    Social History  Substance Use Topics  .  Smoking status: Never Smoker   . Smokeless tobacco: Never Used  . Alcohol Use: No   OB History    No data available     Review of Systems  Constitutional: Negative for fever and chills.  HENT: Negative for nosebleeds.   Eyes: Negative for visual disturbance.  Respiratory: Negative for cough and shortness of breath.   Cardiovascular: Positive for chest pain.  Gastrointestinal: Positive for nausea. Negative for vomiting, abdominal pain, diarrhea and constipation.  Genitourinary: Negative for dysuria.  Skin: Negative for rash.  Neurological: Negative for weakness.  All other systems reviewed and are negative.     Allergies  Contrast media; Iodine; Iohexol; and Metoclopramide  Home Medications   Prior to Admission medications   Medication Sig Start Date End Date Taking? Authorizing Provider  ALPRAZolam Duanne Moron) 0.5 MG tablet Take 0.25 mg by mouth at bedtime as needed for anxiety.  09/11/15  Yes Historical Provider, MD  amiodarone (PACERONE) 200 MG tablet Take 1 tablet (200 mg total) by mouth daily. 11/16/13  Yes Mary-Margaret Hassell Done, [name]  amLODipine (NORVASC) 5 MG tablet TAKE 1 TABLET (5 MG TOTAL) BY MOUTH DAILY. 12/30/15  Yes Mary-Margaret Hassell Done, [name]  aspirin 81 MG tablet Take 81 mg by mouth daily.     Yes Historical Provider, MD  bethanechol (URECHOLINE) 10 MG tablet Take 2 tablets (20 mg total)  by mouth at bedtime as needed. Patient taking differently: Take 20 mg by mouth at bedtime as needed (gastric problems).  01/01/16  Yes Gatha Mayer, MD  Cholecalciferol (VITAMIN D PO) Take 1 tablet by mouth daily.   Yes Historical Provider, MD  erythromycin (E-MYCIN) 500 MG tablet Take 30 min ac for gastric motility 11/05/15  Yes Claretta Fraise, MD  escitalopram (LEXAPRO) 20 MG tablet 2 po Qd Patient taking differently: Take 40 mg by mouth daily.  11/17/15  Yes Mary-Margaret Hassell Done, [name]  EVENING PRIMROSE OIL PO Take 1 capsule by mouth daily.   Yes Historical Provider, MD  furosemide (LASIX)  20 MG tablet Take 1 tablet (20 mg total) by mouth daily. Patient taking differently: Take 20 mg by mouth daily as needed for fluid or edema.  07/07/15  Yes Mary-Margaret Hassell Done, [name]  levothyroxine (SYNTHROID, LEVOTHROID) 88 MCG tablet Take 88 mcg by mouth daily before breakfast.   Yes Historical Provider, MD  Melatonin 5 MG CAPS Take 5 mg by mouth at bedtime as needed (sleep).   Yes Historical Provider, MD  Multiple Vitamin (MULTIVITAMIN WITH MINERALS) TABS tablet Take 1 tablet by mouth daily.   Yes Historical Provider, MD  naproxen sodium (ANAPROX) 220 MG tablet Take 440 mg by mouth 2 (two) times daily as needed (pain).   Yes Historical Provider, MD  olmesartan (BENICAR) 40 MG tablet Take 1 tablet (40 mg total) by mouth daily. 07/07/15  Yes Mary-Margaret Hassell Done, [name]  Omega-3 Fatty Acids (FISH OIL PO) Take 1 tablet by mouth daily.   Yes Historical Provider, MD  omeprazole-sodium bicarbonate (ZEGERID) 40-1100 MG capsule TAKE 1 CAPSULE BY MOUTH TWICE A DAY BEFORE BREAKFAST AND AT BEDTIME 11/03/15  Yes Gatha Mayer, MD  ondansetron (ZOFRAN) 4 MG tablet Take 1 tablet (4 mg total) by mouth every 8 (eight) hours as needed for nausea or vomiting. 10/31/15  Yes Jessica D Zehr, PA-C  spironolactone (ALDACTONE) 25 MG tablet Take 1 tablet by mouth daily.  07/01/15  Yes Historical Provider, MD  VITAMIN E PO Take 5 drops by mouth daily.   Yes Historical Provider, MD   BP 135/55 mmHg  Pulse 63  Resp 16  SpO2 99% Physical Exam  Constitutional: She is oriented to person, place, and time. No distress.  HENT:  Head: Normocephalic and atraumatic.  Eyes: EOM are normal. Pupils are equal, round, and reactive to light.  Neck: Normal range of motion. Neck supple.  Cardiovascular: Normal rate and intact distal pulses.   Pulmonary/Chest: No respiratory distress. She exhibits tenderness (ttp of the central chest).  Abdominal: Soft. There is no tenderness. There is no rebound and no guarding.  Musculoskeletal: Normal  range of motion.  Neurological: She is alert and oriented to person, place, and time.  Skin: No rash noted. She is not diaphoretic.  Psychiatric: She has a normal mood and affect.    ED Course  Procedures (including critical care time) Labs Review Labs Reviewed  CBC WITH DIFFERENTIAL/PLATELET - Abnormal; Notable for the following:    RBC 3.81 (*)    HCT 35.3 (*)    Monocytes Absolute 1.2 (*)    All other components within normal limits  BASIC METABOLIC PANEL - Abnormal; Notable for the following:    CO2 19 (*)    Glucose, Bld 128 (*)    BUN 31 (*)    Creatinine, Ser 1.49 (*)    GFR calc non Af Amer 31 (*)    GFR calc Af Amer 36 (*)  All other components within normal limits  URINALYSIS, ROUTINE W REFLEX MICROSCOPIC (NOT AT [facility]) - Abnormal; Notable for the following:    Hgb urine dipstick SMALL (*)    Leukocytes, UA TRACE (*)    All other components within normal limits  URINE MICROSCOPIC-ADD ON - Abnormal; Notable for the following:    Squamous Epithelial / LPF 0-5 (*)    Bacteria, UA RARE (*)    All other components within normal limits  URINE CULTURE  TSH  T4, FREE  I-STAT TROPOININ, ED    Imaging Review Dg Chest 2 View  01/31/2016  CLINICAL DATA:  Chest pain and nausea EXAM: CHEST  2 VIEW COMPARISON:  05/15/2014 FINDINGS: Normal heart size and mediastinal contours. No acute infiltrate or edema. No effusion or pneumothorax. No acute osseous findings. Cholecystectomy changes. IMPRESSION: No active cardiopulmonary disease. Electronically Signed   By: Monte Fantasia M.D.   On: 01/31/2016 23:31   I have personally reviewed and evaluated these images and lab results as part of my medical decision-making.   EKG Interpretation   Date/Time:  Saturday January 31 2016 22:24:16 EST Ventricular Rate:  63 PR Interval:  177 QRS Duration: 143 QT Interval:  485 QTC Calculation: 496 R Axis:   73 Text Interpretation:  Sinus rhythm IVCD, consider atypical LBBB similar to   previous Confirmed by ZAVITZ  MD, JOSHUA ([id number]) on 01/31/2016 10:29:12 PM      MDM   Final diagnoses:  Chest pain, unspecified chest pain type  Creatinine elevation    71 y F w pmh afib, hl, hypothyroid, GERD, gastroparesis which is followed by GI.  Chronic pain who presents with weakness/chest pain  Exam as above.  AFVSS ekg w/o ischemic changes  Chest pain is reproducible, low concern for acs Will obtain cbc/bmp/trop/cxr  cxr unremarkable.  Cbc/bmp/trop only remarkable for small aki.  ua w/o infection  Will admit for chest pain obs and dehydration.  Will give 1L NS  Jarome Matin, MD 02/01/16 [id number]  Elnora Morrison, MD 02/01/16 [phone]

## 2016-02-01 NOTE — Consult Note (Signed)
Reason for Consult:retrosternal chest discomfort associated with feeling generalized weakness Referring Physician: [facility].  Erica [name] is an 82 y.o. female.  EGB:TDVVOHY is a 82 year old female with past medical history significant for coronary artery disease and distal diffuse LAD stenosis, hypertensive heart disease with diastolic dysfunction, hypertension, hyperlipidemia, hypothyroidism, history of paroxysmal A. Fib in the past, questionable history of TIA in the past, osteoarthritis, GERD, anxiety disorder/depression, was admitted early this morning because of retrosternal and left-sided chest pain described as pressure and grade 3/10, associated with generalized weakness and mild shortness of breath and nausea.  Patient also complains of fatigue, poor appetite, no energy.  States have been having difficult time grieving the loss of her son recently.  EKG done in the ED showed normal sinus rhythm with left bundle branch block.  2 sets of cardiac enzymes have been negative.  Past Medical History  Diagnosis Date  . MVP (mitral valve prolapse)   . Atrial fibrillation ([place])   . Hyperlipidemia   . Hypothyroid   . GERD (gastroesophageal reflux disease)   . Gastroparesis   . Hypertension   . Chronic back pain   . Adenomatous rectal polyp   . Bleeding internal hemorrhoids   . Lichen sclerosus et atrophicus of the vulva   . Diverticulosis of colon (without mention of hemorrhage)   . Hiatal hernia 09/1999    3cm  . TIA (transient ischemic attack)   . Ischemic colitis [facility])     Past Surgical History  Procedure Laterality Date  . Cataract extraction    . Upper gastrointestinal endoscopy  08/23/2011    Normal  . Sigmoidoscopy  08/23/2011  . Tubal ligation    . Cholecystectomy    . Left heart catheterization with coronary angiogram N/A 07/17/2013    Procedure: LEFT HEART CATHETERIZATION WITH CORONARY ANGIOGRAM;  Surgeon: Clent Demark, MD;  Location: [facility] CATH LAB;  Service:  Cardiovascular;  Laterality: N/A;  . Colonoscopy      Multiple, polyps    Family History  Problem Relation Age of Onset  . Stomach cancer Brother   . Colon cancer Paternal Aunt   . Lung cancer Brother     smoker  . Esophageal cancer Father     Social History:  reports that she has never smoked. She has never used smokeless tobacco. She reports that she does not drink alcohol or use illicit drugs.  Allergies:  Allergies  Allergen Reactions  . Contrast Media [Iodinated Diagnostic Agents] Shortness Of Breath, Nausea Only and Swelling  . Iodine Shortness Of Breath, Nausea Only and Swelling  . Iohexol Hives and Swelling     Desc: hives, swelling over 5 yrs ago-pt has never been pre-medicated--was told to not have iv contrast ever   . Metoclopramide Other (See Comments)    Mouth tremors, insomnia, and irritability    Medications: I have reviewed the patient's current medications.  Results for orders placed or performed during the hospital encounter of 01/31/16 (from the past 48 hour(s))  Urinalysis, Routine w reflex microscopic (not at [facility])     Status: Abnormal   Collection Time: 01/31/16 11:36 AM  Result Value Ref Range   Color, Urine YELLOW YELLOW   APPearance CLEAR CLEAR   Specific Gravity, Urine 1.009 1.005 - 1.030   pH 7.5 5.0 - 8.0   Glucose, UA NEGATIVE NEGATIVE mg/dL   Hgb urine dipstick SMALL (A) NEGATIVE   Bilirubin Urine NEGATIVE NEGATIVE   Ketones, ur NEGATIVE NEGATIVE mg/dL   Protein,  ur NEGATIVE NEGATIVE mg/dL   Nitrite NEGATIVE NEGATIVE   Leukocytes, UA TRACE (A) NEGATIVE  Urine microscopic-add on     Status: Abnormal   Collection Time: 01/31/16 11:36 AM  Result Value Ref Range   Squamous Epithelial / LPF 0-5 (A) NONE SEEN   WBC, UA NONE SEEN 0 - 5 WBC/hpf   RBC / HPF NONE SEEN 0 - 5 RBC/hpf   Bacteria, UA RARE (A) NONE SEEN  CBC with Differential     Status: Abnormal   Collection Time: 01/31/16 10:30 PM  Result Value Ref Range   WBC 9.6 4.0 - 10.5  K/uL   RBC 3.81 (L) 3.87 - 5.11 MIL/uL   Hemoglobin 12.1 12.0 - 15.0 g/dL   HCT 35.3 (L) 36.0 - 46.0 %   MCV 92.7 78.0 - 100.0 fL   MCH 31.8 26.0 - 34.0 pg   MCHC 34.3 30.0 - 36.0 g/dL   RDW 13.2 11.5 - 15.5 %   Platelets 190 150 - 400 K/uL   Neutrophils Relative % 62 %   Neutro Abs 6.0 1.7 - 7.7 K/uL   Lymphocytes Relative 24 %   Lymphs Abs 2.3 0.7 - 4.0 K/uL   Monocytes Relative 13 %   Monocytes Absolute 1.2 (H) 0.1 - 1.0 K/uL   Eosinophils Relative 1 %   Eosinophils Absolute 0.1 0.0 - 0.7 K/uL   Basophils Relative 0 %   Basophils Absolute 0.0 0.0 - 0.1 K/uL  Basic metabolic panel     Status: Abnormal   Collection Time: 01/31/16 10:30 PM  Result Value Ref Range   Sodium 135 135 - 145 mmol/L   Potassium 4.9 3.5 - 5.1 mmol/L   Chloride 104 101 - 111 mmol/L   CO2 19 (L) 22 - 32 mmol/L   Glucose, Bld 128 (H) 65 - 99 mg/dL   BUN 31 (H) 6 - 20 mg/dL   Creatinine, Ser 1.49 (H) 0.44 - 1.00 mg/dL   Calcium 9.4 8.9 - 10.3 mg/dL   GFR calc non Af Amer 31 (L) >60 mL/min   GFR calc Af Amer 36 (L) >60 mL/min    Comment: (NOTE) The eGFR has been calculated using the CKD EPI equation. This calculation has not been validated in all clinical situations. eGFR's persistently <60 mL/min signify possible Chronic Kidney Disease.    Anion gap 12 5 - 15  I-Stat Troponin, ED - 0, 3, 6 hours (not at [facility])     Status: None   Collection Time: 01/31/16 10:33 PM  Result Value Ref Range   Troponin i, poc 0.00 0.00 - 0.08 ng/mL   Comment 3            Comment: Due to the release kinetics of cTnI, a negative result within the first hours of the onset of symptoms does not rule out myocardial infarction with certainty. If myocardial infarction is still suspected, repeat the test at appropriate intervals.   TSH     Status: Abnormal   Collection Time: 01/31/16 11:40 PM  Result Value Ref Range   TSH 8.081 (H) 0.350 - 4.500 uIU/mL  T4, free     Status: Abnormal   Collection Time: 01/31/16 11:40 PM   Result Value Ref Range   Free T4 1.72 (H) 0.61 - 1.12 ng/dL  Potassium, Urine Random     Status: None   Collection Time: 02/01/16  2:40 AM  Result Value Ref Range   Potassium Urine Timed 17 mmol/L  Creatinine, urine, random  Status: None   Collection Time: 02/01/16  2:40 AM  Result Value Ref Range   Creatinine, Urine 12.67 mg/dL  Protein, urine, random     Status: None   Collection Time: 02/01/16  2:40 AM  Result Value Ref Range   Total Protein, Urine <6 mg/dL    Comment: NO NORMAL RANGE ESTABLISHED FOR THIS TEST REPEATED TO VERIFY   Sodium, urine, random     Status: None   Collection Time: 02/01/16  2:40 AM  Result Value Ref Range   Sodium, Ur 98 mmol/L  CBC     Status: Abnormal   Collection Time: 02/01/16  3:38 AM  Result Value Ref Range   WBC 8.0 4.0 - 10.5 K/uL   RBC 3.86 (L) 3.87 - 5.11 MIL/uL   Hemoglobin 12.2 12.0 - 15.0 g/dL   HCT 35.6 (L) 36.0 - 46.0 %   MCV 92.2 78.0 - 100.0 fL   MCH 31.6 26.0 - 34.0 pg   MCHC 34.3 30.0 - 36.0 g/dL   RDW 13.3 11.5 - 15.5 %   Platelets 186 150 - 400 K/uL  Creatinine, serum     Status: Abnormal   Collection Time: 02/01/16  3:38 AM  Result Value Ref Range   Creatinine, Ser 1.33 (H) 0.44 - 1.00 mg/dL   GFR calc non Af Amer 36 (L) >60 mL/min   GFR calc Af Amer 42 (L) >60 mL/min    Comment: (NOTE) The eGFR has been calculated using the CKD EPI equation. This calculation has not been validated in all clinical situations. eGFR's persistently <60 mL/min signify possible Chronic Kidney Disease.   Troponin I     Status: None   Collection Time: 02/01/16  3:38 AM  Result Value Ref Range   Troponin I <0.03 <0.031 ng/mL    Comment:        NO INDICATION OF MYOCARDIAL INJURY.   Magnesium     Status: None   Collection Time: 02/01/16  3:38 AM  Result Value Ref Range   Magnesium 2.3 1.7 - 2.4 mg/dL  CBC WITH DIFFERENTIAL     Status: Abnormal   Collection Time: 02/01/16  9:47 AM  Result Value Ref Range   WBC 7.9 4.0 - 10.5 K/uL    RBC 3.75 (L) 3.87 - 5.11 MIL/uL   Hemoglobin 12.0 12.0 - 15.0 g/dL   HCT 35.1 (L) 36.0 - 46.0 %   MCV 93.6 78.0 - 100.0 fL   MCH 32.0 26.0 - 34.0 pg   MCHC 34.2 30.0 - 36.0 g/dL   RDW 13.4 11.5 - 15.5 %   Platelets 191 150 - 400 K/uL   Neutrophils Relative % 65 %   Neutro Abs 5.1 1.7 - 7.7 K/uL   Lymphocytes Relative 25 %   Lymphs Abs 2.0 0.7 - 4.0 K/uL   Monocytes Relative 8 %   Monocytes Absolute 0.7 0.1 - 1.0 K/uL   Eosinophils Relative 1 %   Eosinophils Absolute 0.1 0.0 - 0.7 K/uL   Basophils Relative 1 %   Basophils Absolute 0.1 0.0 - 0.1 K/uL    Dg Chest 2 View  01/31/2016  CLINICAL DATA:  Chest pain and nausea EXAM: CHEST  2 VIEW COMPARISON:  05/15/2014 FINDINGS: Normal heart size and mediastinal contours. No acute infiltrate or edema. No effusion or pneumothorax. No acute osseous findings. Cholecystectomy changes. IMPRESSION: No active cardiopulmonary disease. Electronically Signed   By: Monte Fantasia M.D.   On: 01/31/2016 23:31    Review of Systems  Constitutional: Negative for fever and chills.  Respiratory: Positive for shortness of breath. Negative for cough.   Cardiovascular: Positive for chest pain. Negative for leg swelling and PND.  Gastrointestinal: Positive for nausea. Negative for vomiting, abdominal pain and diarrhea.  Genitourinary: Negative for dysuria.  Neurological: Negative for dizziness and headaches.   Blood pressure 121/50, pulse 58, temperature 97.9 F (36.6 C), temperature source Oral, resp. rate 18, height _0  (1.575 m), weight 67.132 kg (148 lb), SpO2 100 %. Physical Exam  Constitutional: She is oriented to person, place, and time.  HENT:  Head: Normocephalic and atraumatic.  Eyes: Conjunctivae are normal. Pupils are equal, round, and reactive to light.  Neck: Normal range of motion. Neck supple. No JVD present. No tracheal deviation present. No thyromegaly present.  Cardiovascular: Normal rate and regular rhythm.   Murmur (soft systolic  murmur noted.  No S3 gallop or click) heard. Respiratory: Effort normal and breath sounds normal. No respiratory distress. She has no wheezes. She has no rales.  GI: Soft. Bowel sounds are normal. She exhibits no distension. There is no tenderness. There is no rebound.  Musculoskeletal: She exhibits no edema or tenderness.  Neurological: She is alert and oriented to person, place, and time.    Assessment/Plan: Atypical chest pain with some features worrisome for angina.  Rule out MI. Hypertensive heart disease with diastolic dysfunction. Hypertension. History of paroxysmal A. Fib.chadsvasc score of 6 History of questionable TIA in the past. Hyperlipidemia. Hypothyroidism. Degenerative joint disease. GERD. Anxiety disorder and/depression. Acute kidney injury. Dehydration. Plan Check EKG and troponin I in a.m. DC subcutaneous Lovenox and switched to IV heparin. Check 2-D echo. Schedule for Lexiscan Myoview in a.m. If negative for ischemia.  We'll start her on Eliquis    Charolette Forward 02/01/2016, 10:36 AM

## 2016-02-01 NOTE — Progress Notes (Signed)
[place] for Heparin Indication: atrial fibrillation  Allergies  Allergen Reactions  . Contrast Media [Iodinated Diagnostic Agents] Shortness Of Breath, Nausea Only and Swelling  . Iodine Shortness Of Breath, Nausea Only and Swelling  . Iohexol Hives and Swelling     Desc: hives, swelling over 5 yrs ago-pt has never been pre-medicated--was told to not have iv contrast ever   . Metoclopramide Other (See Comments)    Mouth tremors, insomnia, and irritability    Patient Measurements: Height: 5\' 2"  (157.5 cm) Weight: 148 lb (67.132 kg) IBW/kg (Calculated) : 50.1 Heparin Dosing Weight: 64 kg  Vital Signs: Temp: 98.3 F (36.8 C) (02/05 1921) Temp Source: Oral (02/05 1921) BP: 118/46 mmHg (02/05 1921) Pulse Rate: 60 (02/05 1921)  Labs:  Recent Labs  01/31/16 2230 02/01/16 0338 02/01/16 0947 02/01/16 1959  HGB 12.1 12.2 12.0  --   HCT 35.3* 35.6* 35.1*  --   PLT 190 186 191  --   HEPARINUNFRC  --   --   --  1.08*  CREATININE 1.49* 1.33* 1.14*  --   TROPONINI  --  <0.03 <0.03  --     Estimated Creatinine Clearance: 33.6 mL/min (by C-G formula based on Cr of 1.14).   Medical History: Past Medical History  Diagnosis Date  . MVP (mitral valve prolapse)   . Atrial fibrillation ([place])   . Hyperlipidemia   . Hypothyroid   . GERD (gastroesophageal reflux disease)   . Gastroparesis   . Hypertension   . Chronic back pain   . Adenomatous rectal polyp   . Bleeding internal hemorrhoids   . Lichen sclerosus et atrophicus of the vulva   . Diverticulosis of colon (without mention of hemorrhage)   . Hiatal hernia 09/1999    3cm  . TIA (transient ischemic attack)   . Ischemic colitis ([place])     Medications:  Scheduled:  . amiodarone  200 mg Oral Daily  . amLODipine  5 mg Oral Daily  . aspirin EC  81 mg Oral Daily  . erythromycin  500 mg Oral TID AC  . escitalopram  40 mg Oral Daily  . levothyroxine  88 mcg Oral QAC breakfast  .  pantoprazole  40 mg Oral BID  . sodium chloride flush  3 mL Intravenous Q12H   Infusions:  . heparin 900 Units/hr (02/01/16 1148)   PRN: acetaminophen, ALPRAZolam, alum & mag hydroxide-simeth, bethanechol, hydrOXYzine, morphine injection, nitroGLYCERIN, ondansetron (ZOFRAN) IV  Assessment: Erica [name] with PMH of CAD, Afib, HTN, HLD, and Gastroparesis presented with chest pressure and weakness.  Pharmacy is consulted to dose heparin for Afib.  CHADSVASC = 6 No Anticoagulation PTA Last dose ppx lovenox 02/01/16 0900.  Hgb 12, Plts 191 Trop <0.03  Initial heparin level high at 1.08, no bleeding issues noted by nurse. Instructed to hold infusion for 1 hour then restart at reduced rate.  Goal of Therapy:  Heparin level 0.3-0.7 units/ml Monitor platelets by anticoagulation protocol: Yes   Plan:  Hold heparin infusion for 1 hour Restart at 600 units/hr Check anti-Xa level in 8 hours and daily while on heparin Continue to monitor H&H and platelets  Erin Hearing PharmD., BCPS Clinical Pharmacist Pager [phone] 02/01/2016 9:04 PM

## 2016-02-02 ENCOUNTER — Observation Stay (HOSPITAL_COMMUNITY): Payer: Medicare Other

## 2016-02-02 DIAGNOSIS — E785 Hyperlipidemia, unspecified: Secondary | ICD-10-CM | POA: Diagnosis not present

## 2016-02-02 DIAGNOSIS — I480 Paroxysmal atrial fibrillation: Secondary | ICD-10-CM | POA: Diagnosis not present

## 2016-02-02 DIAGNOSIS — I119 Hypertensive heart disease without heart failure: Secondary | ICD-10-CM | POA: Diagnosis not present

## 2016-02-02 DIAGNOSIS — R0789 Other chest pain: Secondary | ICD-10-CM | POA: Diagnosis not present

## 2016-02-02 DIAGNOSIS — E039 Hypothyroidism, unspecified: Secondary | ICD-10-CM | POA: Diagnosis not present

## 2016-02-02 DIAGNOSIS — R079 Chest pain, unspecified: Secondary | ICD-10-CM | POA: Diagnosis not present

## 2016-02-02 DIAGNOSIS — E860 Dehydration: Secondary | ICD-10-CM | POA: Diagnosis not present

## 2016-02-02 LAB — CBC
HEMATOCRIT: 40.5 % (ref 36.0–46.0)
HEMOGLOBIN: 13.5 g/dL (ref 12.0–15.0)
MCH: 31.7 pg (ref 26.0–34.0)
MCHC: 33.3 g/dL (ref 30.0–36.0)
MCV: 95.1 fL (ref 78.0–100.0)
Platelets: 237 10*3/uL (ref 150–400)
RBC: 4.26 MIL/uL (ref 3.87–5.11)
RDW: 13.7 % (ref 11.5–15.5)
WBC: 11.1 10*3/uL — ABNORMAL HIGH (ref 4.0–10.5)

## 2016-02-02 LAB — MICROALBUMIN / CREATININE URINE RATIO
CREATININE, UR: 10 mg/dL
Microalb Creat Ratio: 76 mg/g creat — ABNORMAL HIGH (ref 0.0–30.0)
Microalb, Ur: 7.6 ug/mL — ABNORMAL HIGH

## 2016-02-02 LAB — HEPARIN LEVEL (UNFRACTIONATED)
HEPARIN UNFRACTIONATED: 0.68 [IU]/mL (ref 0.30–0.70)
Heparin Unfractionated: 0.47 IU/mL (ref 0.30–0.70)

## 2016-02-02 LAB — LIPID PANEL
CHOL/HDL RATIO: 3.8 ratio
Cholesterol: 202 mg/dL — ABNORMAL HIGH (ref 0–200)
HDL: 53 mg/dL (ref >40)
LDL Cholesterol: 133 mg/dL — ABNORMAL HIGH (ref 0–99)
Triglycerides: 80 mg/dL (ref <150)
VLDL: 16 mg/dL (ref 0–40)

## 2016-02-02 LAB — URINE CULTURE

## 2016-02-02 LAB — TROPONIN I: Troponin I: <0.03 ng/mL (ref <0.031)

## 2016-02-02 MED ORDER — ATORVASTATIN CALCIUM 40 MG PO TABS: 40.0000 mg | ORAL_TABLET | Freq: Every day | ORAL | Status: DC

## 2016-02-02 MED ORDER — SODIUM CHLORIDE 0.9% FLUSH: 3.0000 mL | INTRAVENOUS | Status: DC | PRN

## 2016-02-02 MED ORDER — TECHNETIUM TC 99M SESTAMIBI GENERIC - CARDIOLITE
10.0000 | Freq: Once | INTRAVENOUS | Status: AC | PRN
  Administered 2016-02-02: 10 via INTRAVENOUS

## 2016-02-02 MED ORDER — SODIUM CHLORIDE 0.9 % WEIGHT BASED INFUSION
1.0000 mL/kg/h | INTRAVENOUS | Status: DC
  Administered 2016-02-03: 1 mL/kg/h via INTRAVENOUS
  Administered 2016-02-03: 500 mL via INTRAVENOUS

## 2016-02-02 MED ORDER — REGADENOSON 0.4 MG/5ML IV SOLN
0.4000 mg | Freq: Once | INTRAVENOUS | Status: AC
  Administered 2016-02-02: 0.4 mg via INTRAVENOUS
  Filled 2016-02-02: qty 5

## 2016-02-02 MED ORDER — REGADENOSON 0.4 MG/5ML IV SOLN
INTRAVENOUS | Status: AC
  Filled 2016-02-02: qty 5

## 2016-02-02 MED ORDER — SODIUM CHLORIDE 0.9% FLUSH
3.0000 mL | Freq: Two times a day (BID) | INTRAVENOUS | Status: DC
  Administered 2016-02-03: 3 mL via INTRAVENOUS

## 2016-02-02 MED ORDER — ATORVASTATIN CALCIUM 40 MG PO TABS
40.0000 mg | ORAL_TABLET | ORAL | Status: AC
  Administered 2016-02-02: 40 mg via ORAL
  Filled 2016-02-02: qty 1

## 2016-02-02 MED ORDER — SODIUM CHLORIDE 0.9 % IV SOLN: 250.0000 mL | INTRAVENOUS | Status: DC | PRN

## 2016-02-02 MED ORDER — TECHNETIUM TC 99M SESTAMIBI GENERIC - CARDIOLITE
30.0000 | Freq: Once | INTRAVENOUS | Status: AC | PRN
  Administered 2016-02-02: 30 via INTRAVENOUS

## 2016-02-02 MED ORDER — ASPIRIN 81 MG PO CHEW
81.0000 mg | CHEWABLE_TABLET | ORAL | Status: AC
  Administered 2016-02-03: 81 mg via ORAL
  Filled 2016-02-02: qty 1

## 2016-02-02 NOTE — Care Management (Addendum)
1518  02-02-16 Pt states she has insurance Part D. Pt uses [place] did call pharmacy and they were able to find coverage. Test Claim completed and pt will need prior authorization. Please call [phone]. 30 day free card provided to pt- she will need Rx for 30 day free. No further needs from CM at this time.  Bethena Roys, RN, BSN [phone]

## 2016-02-02 NOTE — Plan of Care (Signed)
Problem: Consults Goal: Chest Pain Patient Education (See Patient Education module for education specifics.)  Outcome: Completed/Met Date Met:  02/02/16 Pt remains free from chest pain at this time  Problem: Phase II Progression Outcomes Goal: Anginal pain relieved Outcome: Completed/Met Date Met:  02/02/16 Pt remains free from chest pain at this time Goal: Stress Test if indicated Outcome: Completed/Met Date Met:  02/02/16 Pt went down for stress test today   Problem: Phase III Progression Outcomes Goal: No anginal pain Outcome: Completed/Met Date Met:  02/02/16 Pt remains free from chest pain at this time     

## 2016-02-02 NOTE — Progress Notes (Signed)
[facility] PROGRESS NOTE  Erica [name] [id number] DOB: [birth year] DOA: 01/31/2016 PCP: [name], [name]  Assessment/Plan:  Principal Problem:   Chest pressure: Myoview with areas of ischemia, moderate risk study. Await recommendations from cardiology Active Problems:   GERD (gastroesophageal reflux disease)   Gastroparesis   Depression   HTN (hypertension)   Hyperlipidemia   Chest pain   AKI (acute kidney injury) ([place]) improving. [place] lock.   Dehydration   HPI/Subjective:  some epigastric discomfort, feels like her gastroparesis.   Objective: Filed Vitals:   02/02/16 1059 02/02/16 1101  BP: 125/61 127/58  Pulse: 67 66  Temp:    Resp:      Intake/Output Summary (Last 24 hours) at 02/02/16 1647 Last data filed at 02/01/16 2140  Gross per 24 hour  Intake    100 ml  Output    900 ml  Net   -800 ml   Filed Weights   02/01/16 0108 02/02/16 0551  Weight: 67.132 kg (148 lb) 66.225 kg (146 lb)    Exam:   General:  Alert, oriented and comfortable. Eating lunch.   Cardiovascular:  regular rate rhythm without murmurs gallops rubs   Respiratory:  clear to auscultation bilaterally without wheezes rhonchi or rales   Abdomen:  soft nontender nondistended   Ext:  no clubbing cyanosis or edema   Basic Metabolic Panel:  Recent Labs Lab 01/31/16 2230 02/01/16 0338 02/01/16 0947  NA 135  --  135  K 4.9  --  4.4  CL 104  --  105  CO2 19*  --  21*  GLUCOSE 128*  --  111*  BUN 31*  --  19  CREATININE 1.49* 1.33* 1.14*  CALCIUM 9.4  --  8.8*  MG  --  2.3  --   PHOS  --   --  3.1   Liver Function Tests:  Recent Labs Lab 02/01/16 0947  AST 55*  ALT 84*  ALKPHOS 59  BILITOT 0.4  PROT 5.7*  ALBUMIN 3.2*   No results for input(s): LIPASE, AMYLASE in the last 168 hours. No results for input(s): AMMONIA in the last 168 hours. CBC:  Recent Labs Lab 01/31/16 2230 02/01/16 0338 02/01/16 0947 02/02/16 0607  WBC 9.6 8.0 7.9 11.1*   NEUTROABS 6.0  --  5.1  --   HGB 12.1 12.2 12.0 13.5  HCT 35.3* 35.6* 35.1* 40.5  MCV 92.7 92.2 93.6 95.1  PLT 190 186 191 237   Cardiac Enzymes:  Recent Labs Lab 02/01/16 0338 02/01/16 0947 02/02/16 0607  TROPONINI <0.03 <0.03 <0.03   BNP (last 3 results) No results for input(s): BNP in the last 8760 hours.  ProBNP (last 3 results) No results for input(s): PROBNP in the last 8760 hours.  CBG: No results for input(s): GLUCAP in the last 168 hours.  Recent Results (from the past 240 hour(s))  Urine culture     Status: None   Collection Time: 01/31/16 11:36 PM  Result Value Ref Range Status   Specimen Description URINE, CLEAN CATCH  Final   Special Requests NONE  Final   Culture 7,000 COLONIES/mL INSIGNIFICANT GROWTH  Final   Report Status 02/02/2016 FINAL  Final     Studies: Dg Chest 2 View  01/31/2016  CLINICAL DATA:  Chest pain and nausea EXAM: CHEST  2 VIEW COMPARISON:  05/15/2014 FINDINGS: Normal heart size and mediastinal contours. No acute infiltrate or edema. No effusion or pneumothorax. No acute osseous findings. Cholecystectomy changes. IMPRESSION: No active  cardiopulmonary disease. Electronically Signed   By: Monte Fantasia M.D.   On: 01/31/2016 23:31   Nm Myocar Multi W/spect W/wall Motion / Ef  02/02/2016  CLINICAL DATA:  82 year old female with a history of chest pain. Cardiovascular risk factors include known coronary artery disease, hypertension. EXAM: MYOCARDIAL IMAGING WITH SPECT (REST AND PHARMACOLOGIC-STRESS) GATED LEFT VENTRICULAR WALL MOTION STUDY LEFT VENTRICULAR EJECTION FRACTION TECHNIQUE: Standard myocardial SPECT imaging was performed after resting intravenous injection of 10 mCi Tc-54m sestamibi. Subsequently, intravenous infusion of Lexiscan was performed under the supervision of the Cardiology staff. At peak effect of the drug, 30 mCi Tc-47m sestamibi was injected intravenously and standard myocardial SPECT imaging was performed. Quantitative gated  imaging was also performed to evaluate left ventricular wall motion, and estimate left ventricular ejection fraction. COMPARISON:  None. FINDINGS: Perfusion: There are 2 small defects, moderate severity at the apex. One anterior wall, 1 at the base. Wall Motion: Normal left ventricular wall motion. No left ventricular dilation. Left Ventricular Ejection Fraction: 68 % End diastolic volume 60 ml End systolic volume 19 ml IMPRESSION: 1. Two separate defects, small size moderate severity at the anterior apical wall and base apical wall. 2. Normal left ventricular wall motion. 3. Left ventricular ejection fraction 68% 4. Moderate-risk stress test findings*. *2012 Appropriate Use Criteria for Coronary Revascularization Focused Update: J Am Coll Cardiol. N6492421. http://content.airportbarriers.com.aspx?articleid=[email or web address] Electronically Signed   By: Corrie Mckusick D.O.   On: 02/02/2016 16:17    Scheduled Meds: . amiodarone  200 mg Oral Daily  . amLODipine  5 mg Oral Daily  . aspirin EC  81 mg Oral Daily  . erythromycin  500 mg Oral TID AC  . escitalopram  40 mg Oral Daily  . levothyroxine  88 mcg Oral QAC breakfast  . pantoprazole  40 mg Oral BID  . regadenoson      . sodium chloride flush  3 mL Intravenous Q12H   Continuous Infusions: . heparin 600 Units/hr (02/01/16 2250)    Time spent: 15 minutes  [facility] [email or web address], password [facility] 02/02/2016, 4:47 PM

## 2016-02-02 NOTE — Progress Notes (Signed)
  Echocardiogram 2D Echocardiogram has been performed.  Johny Chess 02/02/2016, 4:50 PM

## 2016-02-02 NOTE — Progress Notes (Signed)
Subjective:  Denies any anginal chest pain.  States overall feels better.  Denies shortness of breath.  Objective:  Vital Signs in the last 24 hours: Temp:  [98.3 F (36.8 C)-98.6 F (37 C)] 98.4 F (36.9 C) (02/06 0551) Pulse Rate:  [53-82] 66 (02/06 1101) Resp:  [18] 18 (02/06 0551) BP: (113-127)/(42-75) 127/58 mmHg (02/06 1101) SpO2:  [99 %-100 %] 100 % (02/06 0551) Weight:  [66.225 kg (146 lb)] 66.225 kg (146 lb) (02/06 0551)  Intake/Output from previous day: 02/05 0701 - 02/06 0700 In: 540 [P.O.:100; I.V.:440] Out: 2550 [Urine:2550] Intake/Output from this shift:    Physical Exam: Neck: no adenopathy, no carotid bruit, no JVD and supple, symmetrical, trachea midline Lungs: clear to auscultation bilaterally Heart: regular rate and rhythm, S1, S2 normal and soft systolic murmur noted.  No S3 gallop Abdomen: soft, non-tender; bowel sounds normal; no masses,  no organomegaly Extremities: extremities normal, atraumatic, no cyanosis or edema  Lab Results:  Recent Labs  02/01/16 0947 02/02/16 0607  WBC 7.9 11.1*  HGB 12.0 13.5  PLT 191 237    Recent Labs  01/31/16 2230 02/01/16 0338 02/01/16 0947  NA 135  --  135  K 4.9  --  4.4  CL 104  --  105  CO2 19*  --  21*  GLUCOSE 128*  --  111*  BUN 31*  --  19  CREATININE 1.49* 1.33* 1.14*    Recent Labs  02/01/16 0947 02/02/16 0607  TROPONINI <0.03 <0.03   Hepatic Function Panel  Recent Labs  02/01/16 0947  PROT 5.7*  ALBUMIN 3.2*  AST 55*  ALT 84*  ALKPHOS 59  BILITOT 0.4    Recent Labs  02/02/16 0607  CHOL 202*   No results for input(s): PROTIME in the last 72 hours.  Imaging: Imaging results have been reviewed and Dg Chest 2 View  01/31/2016  CLINICAL DATA:  Chest pain and nausea EXAM: CHEST  2 VIEW COMPARISON:  05/15/2014 FINDINGS: Normal heart size and mediastinal contours. No acute infiltrate or edema. No effusion or pneumothorax. No acute osseous findings. Cholecystectomy changes.  IMPRESSION: No active cardiopulmonary disease. Electronically Signed   By: Monte Fantasia M.D.   On: 01/31/2016 23:31    Cardiac Studies:  Assessment/Plan:  Atypical chest pain with some features worrisome for angina. MI ruled out Hypertensive heart disease with diastolic dysfunction. Hypertension. History of paroxysmal A. Fib.chadsvasc score of 6 History of questionable TIA in the past. Hyperlipidemia. Hypothyroidism. Degenerative joint disease. GERD. Anxiety disorder and/depression. Status postAcute kidney injury. Status postDehydration.  plan Continue present management. If nuclear stress  test result is negative for ischemia.  Okay to discharge from cardiac point of view Start on Eliquis as per pharmacy protocol is negative for ischemia   Charolette Forward 02/02/2016, 12:02 PM

## 2016-02-02 NOTE — Progress Notes (Addendum)
Erica [name] for Heparin Indication: atrial fibrillation  Allergies  Allergen Reactions  . Contrast Media [Iodinated Diagnostic Agents] Shortness Of Breath, Nausea Only and Swelling  . Iodine Shortness Of Breath, Nausea Only and Swelling  . Iohexol Hives and Swelling     Desc: hives, swelling over 5 yrs ago-pt has never been pre-medicated--was told to not have iv contrast ever   . Metoclopramide Other (See Comments)    Mouth tremors, insomnia, and irritability    Patient Measurements: Height: 5\' 2"  (157.5 cm) Weight: 146 lb (66.225 kg) IBW/kg (Calculated) : 50.1 Heparin Dosing Weight: 64 kg  Vital Signs: Temp: 98.4 F (36.9 C) (02/06 0551) Temp Source: Oral (02/06 0551) BP: 113/75 mmHg (02/06 0551) Pulse Rate: 82 (02/06 0551)  Labs:  Recent Labs  01/31/16 2230 02/01/16 0338 02/01/16 0947 02/01/16 1959 02/02/16 0607  HGB 12.1 12.2 12.0  --  13.5  HCT 35.3* 35.6* 35.1*  --  40.5  PLT 190 186 191  --  237  HEPARINUNFRC  --   --   --  1.08* 0.68  CREATININE 1.49* 1.33* 1.14*  --   --   TROPONINI  --  <0.03 <0.03  --  <0.03    Estimated Creatinine Clearance: 33.4 mL/min (by C-G formula based on Cr of 1.14).   Medical History: Past Medical History  Diagnosis Date  . MVP (mitral valve prolapse)   . Atrial fibrillation ([place])   . Hyperlipidemia   . Hypothyroid   . GERD (gastroesophageal reflux disease)   . Gastroparesis   . Hypertension   . Chronic back pain   . Adenomatous rectal polyp   . Bleeding internal hemorrhoids   . Lichen sclerosus et atrophicus of the vulva   . Diverticulosis of colon (without mention of hemorrhage)   . Hiatal hernia 09/1999    3cm  . TIA (transient ischemic attack)   . Ischemic colitis ([place])     Medications:  Scheduled:  . amiodarone  200 mg Oral Daily  . amLODipine  5 mg Oral Daily  . aspirin EC  81 mg Oral Daily  . erythromycin  500 mg Oral TID AC  . escitalopram  40 mg Oral Daily  .  levothyroxine  88 mcg Oral QAC breakfast  . pantoprazole  40 mg Oral BID  . sodium chloride flush  3 mL Intravenous Q12H   Infusions:  . heparin 600 Units/hr (02/01/16 2250)   PRN: acetaminophen, ALPRAZolam, alum & mag hydroxide-simeth, bethanechol, hydrOXYzine, morphine injection, nitroGLYCERIN, ondansetron (ZOFRAN) IV  Assessment: 40 YOF with PMH of CAD, Afib, HTN, HLD, and Gastroparesis presented with chest pressure and weakness.  Pharmacy is consulted to dose heparin for Afib.  CHADSVASC = 6 No Anticoagulation PTA Last dose ppx lovenox 02/01/16 0900.  Hgb 12, Plts 191 Trop <0.03  Initial heparin level high at 1.08, no bleeding issues noted by nurse. Instructed to hold infusion for 1 hour then restart at reduced rate. Follow-up HL is now therapeutic at 0.68 on heparin 600 units/hr. No issues with infusion or bleeding noted.  Goal of Therapy:  Heparin level 0.3-0.7 units/ml Monitor platelets by anticoagulation protocol: Yes   Plan:  Continue heparin 600 units/hr Check anti-Xa level in 8 hours and daily while on heparin Continue to monitor H&H and platelets  Andrey Cota. Diona Foley, PharmD, BCPS Clinical Pharmacist Pager [phone]  02/02/2016 8:15 AM  ADDN: Confirmatory HL remains therapeutic at 0.48 on heparin 600 units/hr. No issues with infusion or bleeding noted. Continue  current rate and recheck with AM labs.  Andrey Cota. Diona Foley, PharmD, [place] Clinical Pharmacist Pager [phone]

## 2016-02-02 NOTE — Progress Notes (Signed)
Subjective: Nuclear stress tests suggested 2 separate defects small size of moderate severe T at the anterior apical wall and base with normal EF of 68%. Discussed with patient and her granddaughter at length regarding stress test finding and left cardiac cath possible PTCA stenting its risk and benefits and consents for PCI Objective:  Vital Signs in the last 24 hours: Temp:  [98.3 F (36.8 C)-98.6 F (37 C)] 98.6 F (37 C) (02/06 1700) Pulse Rate:  [53-82] 71 (02/06 1700) Resp:  [18] 18 (02/06 1700) BP: (113-127)/(46-75) 120/62 mmHg (02/06 1700) SpO2:  [100 %] 100 % (02/06 1700) Weight:  [66.225 kg (146 lb)] 66.225 kg (146 lb) (02/06 0551)  Intake/Output from previous day: 02/05 0701 - 02/06 0700 In: 540 [P.O.:100; I.V.:440] Out: 2550 [Urine:2550] Intake/Output from this shift:    Physical Exam: Unchanged  Lab Results:  Recent Labs  02/01/16 0947 02/02/16 0607  WBC 7.9 11.1*  HGB 12.0 13.5  PLT 191 237    Recent Labs  01/31/16 2230 02/01/16 0338 02/01/16 0947  NA 135  --  135  K 4.9  --  4.4  CL 104  --  105  CO2 19*  --  21*  GLUCOSE 128*  --  111*  BUN 31*  --  19  CREATININE 1.49* 1.33* 1.14*    Recent Labs  02/01/16 0947 02/02/16 0607  TROPONINI <0.03 <0.03   Hepatic Function Panel  Recent Labs  02/01/16 0947  PROT 5.7*  ALBUMIN 3.2*  AST 55*  ALT 84*  ALKPHOS 59  BILITOT 0.4    Recent Labs  02/02/16 0607  CHOL 202*   No results for input(s): PROTIME in the last 72 hours.  Imaging: Imaging results have been reviewed and Dg Chest 2 View  01/31/2016  CLINICAL DATA:  Chest pain and nausea EXAM: CHEST  2 VIEW COMPARISON:  05/15/2014 FINDINGS: Normal heart size and mediastinal contours. No acute infiltrate or edema. No effusion or pneumothorax. No acute osseous findings. Cholecystectomy changes. IMPRESSION: No active cardiopulmonary disease. Electronically Signed   By: Monte Fantasia M.D.   On: 01/31/2016 23:31   Nm Myocar Multi W/spect  W/wall Motion / Ef  02/02/2016  ADDENDUM REPORT: 02/02/2016 17:41 ADDENDUM: Addendum created to clarify reversible defects. IMPRESSION: 1. Two separate, small, moderate-severity reversible defects, 1 at the anterior apical wall and 1 at the base apical wall. 2. Normal left ventricular wall motion. 3. Left ventricular ejection fraction 68% 4. Moderate-risk stress test findings*. *2012 Appropriate Use Criteria for Coronary Revascularization Focused Update: J Am Coll Cardiol. N6492421. http://[email or web address]?[email or web address] Electronically Signed   By: Corrie Mckusick D.O.   On: 02/02/2016 17:41  02/02/2016  CLINICAL DATA:  82 year old female with a history of chest pain. Cardiovascular risk factors include known coronary artery disease, hypertension. EXAM: MYOCARDIAL IMAGING WITH SPECT (REST AND PHARMACOLOGIC-STRESS) GATED LEFT VENTRICULAR WALL MOTION STUDY LEFT VENTRICULAR EJECTION FRACTION TECHNIQUE: Standard myocardial SPECT imaging was performed after resting intravenous injection of 10 mCi Tc-36m sestamibi. Subsequently, intravenous infusion of Lexiscan was performed under the supervision of the Cardiology staff. At peak effect of the drug, 30 mCi Tc-76m sestamibi was injected intravenously and standard myocardial SPECT imaging was performed. Quantitative gated imaging was also performed to evaluate left ventricular wall motion, and estimate left ventricular ejection fraction. COMPARISON:  None. FINDINGS: Perfusion: There are 2 small defects, moderate severity at the apex. One anterior wall, 1 at the base. Wall Motion: Normal left ventricular wall motion. No left ventricular dilation. Left Ventricular Ejection Fraction:  68 % End diastolic volume 60 ml End systolic volume 19 ml IMPRESSION: 1. Two separate defects, small size moderate severity at the anterior apical wall and base apical wall. 2. Normal left ventricular wall motion. 3. Left ventricular ejection fraction 68% 4.  Moderate-risk stress test findings*. *2012 Appropriate Use Criteria for Coronary Revascularization Focused Update: J Am Coll Cardiol. [id number]. http://content.[email or web address]?[email or web address] Electronically Signed: By: Corrie Mckusick D.O. On: 02/02/2016 16:17    Cardiac Studies:  Assessment/Plan:  Atypical chest pain with some features worrisome for angina. MI ruled out positive nuclear stress test as above Hypertensive heart disease with diastolic dysfunction. Hypertension. History of paroxysmal A. Fib.chadsvasc score of 6 History of questionable TIA in the past. Hyperlipidemia. Hypothyroidism. Degenerative joint disease. GERD. Anxiety disorder and/depression. Status postAcute kidney injury. Status postDehydration.  Plan   continue present management and Discussed with patient and her granddaughter regarding left cath possible PTCA stenting its risk and benefits i.e. death MI stroke need for emergency CABG local vascular complications etc. and consents for PCI   Charolette Forward 02/02/2016, 6:02 PM

## 2016-02-03 ENCOUNTER — Encounter (HOSPITAL_COMMUNITY)
Admission: EM | Disposition: A | Discharge status: Home / Self Care | Payer: Self-pay | Source: Home / Self Care | Attending: Emergency Medicine

## 2016-02-03 DIAGNOSIS — E785 Hyperlipidemia, unspecified: Secondary | ICD-10-CM | POA: Diagnosis not present

## 2016-02-03 DIAGNOSIS — I480 Paroxysmal atrial fibrillation: Secondary | ICD-10-CM | POA: Diagnosis not present

## 2016-02-03 DIAGNOSIS — R0789 Other chest pain: Secondary | ICD-10-CM | POA: Diagnosis not present

## 2016-02-03 DIAGNOSIS — E039 Hypothyroidism, unspecified: Secondary | ICD-10-CM | POA: Diagnosis not present

## 2016-02-03 DIAGNOSIS — E860 Dehydration: Secondary | ICD-10-CM | POA: Diagnosis not present

## 2016-02-03 DIAGNOSIS — I119 Hypertensive heart disease without heart failure: Secondary | ICD-10-CM | POA: Diagnosis not present

## 2016-02-03 HISTORY — PX: CARDIAC CATHETERIZATION: SHX172

## 2016-02-03 LAB — HEPARIN LEVEL (UNFRACTIONATED): HEPARIN UNFRACTIONATED: 0.39 [IU]/mL (ref 0.30–0.70)

## 2016-02-03 LAB — CBC
HCT: 34.8 % — ABNORMAL LOW (ref 36.0–46.0)
HEMOGLOBIN: 11.7 g/dL — AB (ref 12.0–15.0)
MCH: 31.8 pg (ref 26.0–34.0)
MCHC: 33.6 g/dL (ref 30.0–36.0)
MCV: 94.6 fL (ref 78.0–100.0)
PLATELETS: 181 10*3/uL (ref 150–400)
RBC: 3.68 MIL/uL — AB (ref 3.87–5.11)
RDW: 13.4 % (ref 11.5–15.5)
WBC: 8.4 10*3/uL (ref 4.0–10.5)

## 2016-02-03 LAB — PROTIME-INR
INR: 1.05 (ref 0.00–1.49)
Prothrombin Time: 13.9 seconds (ref 11.6–15.2)

## 2016-02-03 SURGERY — CORONARY/GRAFT ANGIOGRAPHY

## 2016-02-03 MED ORDER — LIDOCAINE HCL (PF) 1 % IJ SOLN
INTRAMUSCULAR | Status: DC | PRN
  Administered 2016-02-03: 20 mL

## 2016-02-03 MED ORDER — HEPARIN (PORCINE) IN NACL 2-0.9 UNIT/ML-% IJ SOLN
INTRAMUSCULAR | Status: AC
  Filled 2016-02-03: qty 1000

## 2016-02-03 MED ORDER — MIDAZOLAM HCL 2 MG/2ML IJ SOLN
INTRAMUSCULAR | Status: AC
  Filled 2016-02-03: qty 2

## 2016-02-03 MED ORDER — SODIUM CHLORIDE 0.9 % IV SOLN: 250.0000 mL | INTRAVENOUS | Status: DC | PRN

## 2016-02-03 MED ORDER — FAMOTIDINE IN NACL 20-0.9 MG/50ML-% IV SOLN
20.0000 mg | Freq: Once | INTRAVENOUS | Status: AC
  Administered 2016-02-03: 20 mg via INTRAVENOUS
  Filled 2016-02-03: qty 50

## 2016-02-03 MED ORDER — MIDAZOLAM HCL 2 MG/2ML IJ SOLN
INTRAMUSCULAR | Status: DC | PRN
Start: 2016-02-03 — End: 2016-02-03
  Administered 2016-02-03 (×2): 1 mg via INTRAVENOUS

## 2016-02-03 MED ORDER — IOHEXOL 350 MG/ML SOLN
INTRAVENOUS | Status: DC | PRN
  Administered 2016-02-03: 30 mL via INTRA_ARTERIAL

## 2016-02-03 MED ORDER — FENTANYL CITRATE (PF) 100 MCG/2ML IJ SOLN
INTRAMUSCULAR | Status: AC
  Filled 2016-02-03: qty 2

## 2016-02-03 MED ORDER — SODIUM CHLORIDE 0.9 % IV SOLN: INTRAVENOUS | Status: DC

## 2016-02-03 MED ORDER — SODIUM CHLORIDE 0.9% FLUSH
3.0000 mL | Freq: Two times a day (BID) | INTRAVENOUS | Status: DC
  Administered 2016-02-03 – 2016-02-04 (×2): 3 mL via INTRAVENOUS

## 2016-02-03 MED ORDER — FENTANYL CITRATE (PF) 100 MCG/2ML IJ SOLN
INTRAMUSCULAR | Status: DC | PRN
  Administered 2016-02-03 (×2): 25 ug via INTRAVENOUS

## 2016-02-03 MED ORDER — DIPHENHYDRAMINE HCL 50 MG/ML IJ SOLN
25.0000 mg | Freq: Once | INTRAMUSCULAR | Status: AC
  Administered 2016-02-03: 25 mg via INTRAVENOUS
  Filled 2016-02-03: qty 1

## 2016-02-03 MED ORDER — HYDROCORTISONE NA SUCCINATE PF 100 MG IJ SOLR
100.0000 mg | Freq: Once | INTRAMUSCULAR | Status: AC
Start: 2016-02-03 — End: 2016-02-03
  Administered 2016-02-03: 100 mg via INTRAVENOUS
  Filled 2016-02-03: qty 2

## 2016-02-03 MED ORDER — HEPARIN (PORCINE) IN NACL 2-0.9 UNIT/ML-% IJ SOLN
INTRAMUSCULAR | Status: DC | PRN
  Administered 2016-02-03: 1000 mL

## 2016-02-03 MED ORDER — SODIUM CHLORIDE 0.9% FLUSH: 3.0000 mL | INTRAVENOUS | Status: DC | PRN

## 2016-02-03 MED ORDER — LIDOCAINE HCL (PF) 1 % IJ SOLN
INTRAMUSCULAR | Status: AC
  Filled 2016-02-03: qty 30

## 2016-02-03 SURGICAL SUPPLY — 8 items
CATH INFINITI 5FR MULTPACK ANG (CATHETERS) ×3 IMPLANT
KIT HEART LEFT (KITS) ×3 IMPLANT
PACK CARDIAC CATHETERIZATION (CUSTOM PROCEDURE TRAY) ×3 IMPLANT
SHEATH PINNACLE 5F 10CM (SHEATH) ×3 IMPLANT
SYR MEDRAD MARK V 150ML (SYRINGE) ×3 IMPLANT
TRANSDUCER W/STOPCOCK (MISCELLANEOUS) ×6 IMPLANT
WIRE EMERALD 3MM-J .035X150CM (WIRE) ×3 IMPLANT
WIRE EMERALD ST .035X150CM (WIRE) IMPLANT

## 2016-02-03 NOTE — Plan of Care (Signed)
Problem: Phase II Progression Outcomes Goal: Cath/PCI Day Path if indicated Outcome: Completed/Met Date Met:  02/03/16 Pt educated on cardiac catheterization with possible percutaneous coronary intervention. Had no questions. Denied wanting to watch the cardiac cath video. Pt back from cath, medical management, femoral site level 0, No complaints. Family at bedside.

## 2016-02-03 NOTE — Progress Notes (Signed)
[place] for Heparin Indication: atrial fibrillation  Allergies  Allergen Reactions  . Contrast Media [Iodinated Diagnostic Agents] Shortness Of Breath, Nausea Only and Swelling  . Iodine Shortness Of Breath, Nausea Only and Swelling  . Iohexol Hives and Swelling     Desc: hives, swelling over 5 yrs ago-pt has never been pre-medicated--was told to not have iv contrast ever   . Metoclopramide Other (See Comments)    Mouth tremors, insomnia, and irritability    Patient Measurements: Height: 5\' 2"  (157.5 cm) Weight: 147 lb 6.4 oz (66.86 kg) IBW/kg (Calculated) : 50.1 Heparin Dosing Weight: 64 kg  Vital Signs: Temp: 97.5 F (36.4 C) (02/07 0451) Temp Source: Oral (02/07 0451) BP: 115/54 mmHg (02/07 0812) Pulse Rate: 55 (02/07 0451)  Labs:  Recent Labs  01/31/16 2230 02/01/16 0338 02/01/16 0947  02/02/16 0607 02/02/16 1405 02/03/16 0641  HGB 12.1 12.2 12.0  --  13.5  --  11.7*  HCT 35.3* 35.6* 35.1*  --  40.5  --  34.8*  PLT 190 186 191  --  237  --  181  LABPROT  --   --   --   --   --   --  13.9  INR  --   --   --   --   --   --  1.05  HEPARINUNFRC  --   --   --   < > 0.68 0.47 0.39  CREATININE 1.49* 1.33* 1.14*  --   --   --   --   TROPONINI  --  <0.03 <0.03  --  <0.03  --   --   < > = values in this interval not displayed.  Estimated Creatinine Clearance: 33.5 mL/min (by C-G formula based on Cr of 1.14).   Medical History: Past Medical History  Diagnosis Date  . MVP (mitral valve prolapse)   . Atrial fibrillation ([place])   . Hyperlipidemia   . Hypothyroid   . GERD (gastroesophageal reflux disease)   . Gastroparesis   . Hypertension   . Chronic back pain   . Adenomatous rectal polyp   . Bleeding internal hemorrhoids   . Lichen sclerosus et atrophicus of the vulva   . Diverticulosis of colon (without mention of hemorrhage)   . Hiatal hernia 09/1999    3cm  . TIA (transient ischemic attack)   . Ischemic colitis  ([place])     Medications:  Scheduled:  . amiodarone  200 mg Oral Daily  . amLODipine  5 mg Oral Daily  . aspirin EC  81 mg Oral Daily  . [START ON 02/04/2016] atorvastatin  40 mg Oral Q0600  . erythromycin  500 mg Oral TID AC  . escitalopram  40 mg Oral Daily  . levothyroxine  88 mcg Oral QAC breakfast  . pantoprazole  40 mg Oral BID  . sodium chloride flush  3 mL Intravenous Q12H  . sodium chloride flush  3 mL Intravenous Q12H   Infusions:  . sodium chloride 1 mL/kg/hr (02/02/16 2100)  . heparin 600 Units/hr (02/02/16 1900)   PRN: sodium chloride, acetaminophen, ALPRAZolam, alum & mag hydroxide-simeth, bethanechol, hydrOXYzine, morphine injection, nitroGLYCERIN, ondansetron (ZOFRAN) IV, sodium chloride flush  Assessment: 50 YOF with PMH of CAD, Afib, HTN, HLD, and Gastroparesis presented with chest pressure and weakness.  Pharmacy is consulted to dose heparin for Afib.  CHADSVASC = 6 No Anticoagulation PTA Last dose ppx lovenox 02/01/16 0900.  Hgb 12, Plts 191 Trop <0.03  This morning's HL remains therapeutic, though trending down. Pt is scheduled to go to cath this afternoon. Will continue current rate and follow-up after cath.  Goal of Therapy:  Heparin level 0.3-0.7 units/ml Monitor platelets by anticoagulation protocol: Yes   Plan:  Continue heparin 600 units/hr Daily HL/CBC Continue to monitor H&H and platelets  Andrey Cota. Diona Foley, PharmD, [place] Clinical Pharmacist Pager [phone]  02/03/2016 8:31 AM

## 2016-02-03 NOTE — Plan of Care (Signed)
Problem: Phase II Progression Outcomes Goal: Cath/PCI Day Path if indicated Outcome: Progressing Reviewed the heart cath procedure, risks and benefits and materials provided, patient instructed to turn TV to channel 115 for heart cath video but she has family visiting and she felt comfortable with the information provided and her and her family's questions answered at this time. Patient is aware that she will be NPO after midnight for her procedure in the am and will be prepped via right groin in the am as per MD orders.

## 2016-02-03 NOTE — Plan of Care (Signed)
Problem: Education: Goal: Knowledge of [facility] Education information/materials will improve Outcome: Progressing Reviewed safety at [facility] and explained to her about the white board, how to call me or her NT and her call light is at her bedside within reach, instructed her that the bed alarm would be turned on throughout the night to remind her not to get OOB without calling for assistance and she verbalized understanding. Reviewed her procedure and her care pre and post procedure and materials provided and reviewed with her and her family and she verbalized understanding after her questions were answered.

## 2016-02-03 NOTE — Interval H&P Note (Signed)
Cath Lab Visit (complete for each Cath Lab visit)  Clinical Evaluation Leading to the Procedure:   ACS: No.  Non-ACS:    Anginal Classification: CCS IV  Anti-ischemic medical therapy: Maximal Therapy (2 or more classes of medications)  Non-Invasive Test Results: Intermediate-risk stress test findings: cardiac mortality 1-3%/year  Prior CABG: No previous CABG      History and Physical Interval Note:  02/03/2016 3:25 PM  Erica [name]  has presented today for surgery, with the diagnosis of cp  The various methods of treatment have been discussed with the patient and family. After consideration of risks, benefits and other options for treatment, the patient has consented to  Procedure(s): Left Heart Cath and Coronary Angiography (N/A) as a surgical intervention .  The patient's history has been reviewed, patient examined, no change in status, stable for surgery.  I have reviewed the patient's chart and labs.  Questions were answered to the patient's satisfaction.     Charolette Forward

## 2016-02-03 NOTE — H&P (View-Only) (Signed)
Subjective:  Denies any anginal chest pain.  States overall feels better.  Denies shortness of breath.  Objective:  Vital Signs in the last 24 hours: Temp:  [98.3 F (36.8 C)-98.6 F (37 C)] 98.4 F (36.9 C) (02/06 0551) Pulse Rate:  [53-82] 66 (02/06 1101) Resp:  [18] 18 (02/06 0551) BP: (113-127)/(42-75) 127/58 mmHg (02/06 1101) SpO2:  [99 %-100 %] 100 % (02/06 0551) Weight:  [66.225 kg (146 lb)] 66.225 kg (146 lb) (02/06 0551)  Intake/Output from previous day: 02/05 0701 - 02/06 0700 In: 540 [P.O.:100; I.V.:440] Out: 2550 [Urine:2550] Intake/Output from this shift:    Physical Exam: Neck: no adenopathy, no carotid bruit, no JVD and supple, symmetrical, trachea midline Lungs: clear to auscultation bilaterally Heart: regular rate and rhythm, S1, S2 normal and soft systolic murmur noted.  No S3 gallop Abdomen: soft, non-tender; bowel sounds normal; no masses,  no organomegaly Extremities: extremities normal, atraumatic, no cyanosis or edema  Lab Results:  Recent Labs  02/01/16 0947 02/02/16 0607  WBC 7.9 11.1*  HGB 12.0 13.5  PLT 191 237    Recent Labs  01/31/16 2230 02/01/16 0338 02/01/16 0947  NA 135  --  135  K 4.9  --  4.4  CL 104  --  105  CO2 19*  --  21*  GLUCOSE 128*  --  111*  BUN 31*  --  19  CREATININE 1.49* 1.33* 1.14*    Recent Labs  02/01/16 0947 02/02/16 0607  TROPONINI <0.03 <0.03   Hepatic Function Panel  Recent Labs  02/01/16 0947  PROT 5.7*  ALBUMIN 3.2*  AST 55*  ALT 84*  ALKPHOS 59  BILITOT 0.4    Recent Labs  02/02/16 0607  CHOL 202*   No results for input(s): PROTIME in the last 72 hours.  Imaging: Imaging results have been reviewed and Dg Chest 2 View  01/31/2016  CLINICAL DATA:  Chest pain and nausea EXAM: CHEST  2 VIEW COMPARISON:  05/15/2014 FINDINGS: Normal heart size and mediastinal contours. No acute infiltrate or edema. No effusion or pneumothorax. No acute osseous findings. Cholecystectomy changes.  IMPRESSION: No active cardiopulmonary disease. Electronically Signed   By: Monte Fantasia M.D.   On: 01/31/2016 23:31    Cardiac Studies:  Assessment/Plan:  Atypical chest pain with some features worrisome for angina. MI ruled out Hypertensive heart disease with diastolic dysfunction. Hypertension. History of paroxysmal A. Fib.chadsvasc score of 6 History of questionable TIA in the past. Hyperlipidemia. Hypothyroidism. Degenerative joint disease. GERD. Anxiety disorder and/depression. Status postAcute kidney injury. Status postDehydration.  plan Continue present management. If nuclear stress  test result is negative for ischemia.  Okay to discharge from cardiac point of view Start on Eliquis as per pharmacy protocol is negative for ischemia   Charolette Forward 02/02/2016, 12:02 PM

## 2016-02-03 NOTE — Progress Notes (Signed)
Subjective:  Patient tolerated left cardiac catheterization noted to have nonobstructive coronary artery disease. As per procedure report.  Objective:  Vital Signs in the last 24 hours: Temp:  [97.5 F (36.4 C)-98.6 F (37 C)] 97.5 F (36.4 C) (02/07 0451) Pulse Rate:  [55-71] 55 (02/07 1644) Resp:  [9-18] 13 (02/07 1644) BP: (93-141)/(39-63) 113/39 mmHg (02/07 1644) SpO2:  [0 %-100 %] 93 % (02/07 1644) Weight:  [66.86 kg (147 lb 6.4 oz)] 66.86 kg (147 lb 6.4 oz) (02/07 0451)  Intake/Output from previous day: 02/06 0701 - 02/07 0700 In: [id number] [P.O.:120; I.V.:734] Out: 750 [Urine:750] Intake/Output from this shift: Total I/O In: 889.8 [P.O.:240; I.V.:649.8] Out: 2 [Urine:1; Stool:1]  Physical Exam: Neck: no adenopathy, no carotid bruit, no JVD and supple, symmetrical, trachea midline Lungs: clear to auscultation bilaterally Heart: regular rate and rhythm, S1, S2 normal and Soft systolic murmur noted Abdomen: soft, non-tender; bowel sounds normal; no masses,  no organomegaly Extremities: extremities normal, atraumatic, no cyanosis or edema  Lab Results:  Recent Labs  02/02/16 0607 02/03/16 0641  WBC 11.1* 8.4  HGB 13.5 11.7*  PLT 237 181    Recent Labs  01/31/16 2230 02/01/16 0338 02/01/16 0947  NA 135  --  135  K 4.9  --  4.4  CL 104  --  105  CO2 19*  --  21*  GLUCOSE 128*  --  111*  BUN 31*  --  19  CREATININE 1.49* 1.33* 1.14*    Recent Labs  02/01/16 0947 02/02/16 0607  TROPONINI <0.03 <0.03   Hepatic Function Panel  Recent Labs  02/01/16 0947  PROT 5.7*  ALBUMIN 3.2*  AST 55*  ALT 84*  ALKPHOS 59  BILITOT 0.4    Recent Labs  02/02/16 0607  CHOL 202*   No results for input(s): PROTIME in the last 72 hours.  Imaging: Imaging results have been reviewed and Nm Myocar Multi W/spect W/wall Motion / Ef  02/02/2016  ADDENDUM REPORT: 02/02/2016 17:41 ADDENDUM: Addendum created to clarify reversible defects. IMPRESSION: 1. Two separate,  small, moderate-severity reversible defects, 1 at the anterior apical wall and 1 at the base apical wall. 2. Normal left ventricular wall motion. 3. Left ventricular ejection fraction 68% 4. Moderate-risk stress test findings*. *2012 Appropriate Use Criteria for Coronary Revascularization Focused Update: J Am Coll Cardiol. [id number]. http://[email or web address]?[email or web address] Electronically Signed   By: Corrie Mckusick D.O.   On: 02/02/2016 17:41  02/02/2016  CLINICAL DATA:  82 year old female with a history of chest pain. Cardiovascular risk factors include known coronary artery disease, hypertension. EXAM: MYOCARDIAL IMAGING WITH SPECT (REST AND PHARMACOLOGIC-STRESS) GATED LEFT VENTRICULAR WALL MOTION STUDY LEFT VENTRICULAR EJECTION FRACTION TECHNIQUE: Standard myocardial SPECT imaging was performed after resting intravenous injection of 10 mCi Tc-50m sestamibi. Subsequently, intravenous infusion of Lexiscan was performed under the supervision of the Cardiology staff. At peak effect of the drug, 30 mCi Tc-42m sestamibi was injected intravenously and standard myocardial SPECT imaging was performed. Quantitative gated imaging was also performed to evaluate left ventricular wall motion, and estimate left ventricular ejection fraction. COMPARISON:  None. FINDINGS: Perfusion: There are 2 small defects, moderate severity at the apex. One anterior wall, 1 at the base. Wall Motion: Normal left ventricular wall motion. No left ventricular dilation. Left Ventricular Ejection Fraction: 68 % End diastolic volume 60 ml End systolic volume 19 ml IMPRESSION: 1. Two separate defects, small size moderate severity at the anterior apical wall and base apical wall. 2. Normal left ventricular wall motion. 3.  Left ventricular ejection fraction 68% 4. Moderate-risk stress test findings*. *2012 Appropriate Use Criteria for Coronary Revascularization Focused Update: J Am Coll Cardiol. [id number].  http://[email or web address]?[email or web address] Electronically Signed: By: Corrie Mckusick D.O. On: 02/02/2016 16:17    Cardiac Studies:  Assessment/Plan:  Atypical chest pain with some features worrisome for angina. MI ruled out positive nuclear stress test as above Mild nonobstructive coronary artery disease Hypertensive heart disease with diastolic dysfunction. Hypertension. History of paroxysmal A. Fib.chadsvasc score of 6 History of questionable TIA in the past. Hyperlipidemia. Hypothyroidism. Degenerative joint disease. GERD. Anxiety disorder and/depression.   Plan Continue present management Will start on Eliquis tomorrow 2.5 mg twice daily in view of history of paroxysmal A. fib in the past if groin stable Okay to discharge home tomorrow if stable from cardiac point of view.  Charolette Forward 02/03/2016, 4:51 PM

## 2016-02-03 NOTE — Progress Notes (Addendum)
[facility] PROGRESS NOTE  Erica [name] [id number] DOB: Jan 22, 1930 DOA: 01/31/2016 PCP: [name], [name]  Summary 82 yo female with h/o GERD, gastroparesis, PAF, HTN, HLD admitted with CP and weakness. Creatinine above baseline and deyhydrated on exam. Known to Dr. Terrence Dupont who consulted, and recommended heparin gtt and myoview. MI ruled out. myoview with areas of ischemia.  Assessment/Plan:  Principal Problem:   Chest pressure: Myoview with areas of ischemia, moderate risk study. Has been in cath lab all afternoon. Await results. Will return to examine Active Problems:   GERD (gastroesophageal reflux disease): changed PPI to BID, as seemed to have GI etiology to pain PAF chadsvasc 6. Cardiology recommends eventual eliquis. I have already called to preauthorize 5 mg po bid, dose verified with pharmacy   Gastroparesis   Depression   HTN (hypertension)   Hyperlipidemia   AKI (acute kidney injury) ([place]) improving with hydration.   Dehydration   HPI/Subjective: Not in room  Objective: Filed Vitals:   02/03/16 1639 02/03/16 1644  BP: 125/42 113/39  Pulse: 57 55  Temp:    Resp: 13 13    Intake/Output Summary (Last 24 hours) at 02/03/16 1653 Last data filed at 02/03/16 1500  Gross per 24 hour  Intake 1743.8 ml  Output    752 ml  Net  991.8 ml   Filed Weights   02/01/16 0108 02/02/16 0551 02/03/16 0451  Weight: 67.132 kg (148 lb) 66.225 kg (146 lb) 66.86 kg (147 lb 6.4 oz)    Exam:  Not in room  Basic Metabolic Panel:  Recent Labs Lab 01/31/16 2230 02/01/16 0338 02/01/16 0947  NA 135  --  135  K 4.9  --  4.4  CL 104  --  105  CO2 19*  --  21*  GLUCOSE 128*  --  111*  BUN 31*  --  19  CREATININE 1.49* 1.33* 1.14*  CALCIUM 9.4  --  8.8*  MG  --  2.3  --   PHOS  --   --  3.1   Liver Function Tests:  Recent Labs Lab 02/01/16 0947  AST 55*  ALT 84*  ALKPHOS 59  BILITOT 0.4  PROT 5.7*  ALBUMIN 3.2*   No results for input(s):  LIPASE, AMYLASE in the last 168 hours. No results for input(s): AMMONIA in the last 168 hours. CBC:  Recent Labs Lab 01/31/16 2230 02/01/16 0338 02/01/16 0947 02/02/16 0607 02/03/16 0641  WBC 9.6 8.0 7.9 11.1* 8.4  NEUTROABS 6.0  --  5.1  --   --   HGB 12.1 12.2 12.0 13.5 11.7*  HCT 35.3* 35.6* 35.1* 40.5 34.8*  MCV 92.7 92.2 93.6 95.1 94.6  PLT 190 186 191 237 181   Cardiac Enzymes:  Recent Labs Lab 02/01/16 0338 02/01/16 0947 02/02/16 0607  TROPONINI <0.03 <0.03 <0.03   BNP (last 3 results) No results for input(s): BNP in the last 8760 hours.  ProBNP (last 3 results) No results for input(s): PROBNP in the last 8760 hours.  CBG: No results for input(s): GLUCAP in the last 168 hours.  Recent Results (from the past 240 hour(s))  Urine culture     Status: None   Collection Time: 01/31/16 11:36 PM  Result Value Ref Range Status   Specimen Description URINE, CLEAN CATCH  Final   Special Requests NONE  Final   Culture 7,000 COLONIES/mL INSIGNIFICANT GROWTH  Final   Report Status 02/02/2016 FINAL  Final     Studies: Nm Myocar Multi W/spect  W/wall Motion / Ef  02/02/2016  ADDENDUM REPORT: 02/02/2016 17:41 ADDENDUM: Addendum created to clarify reversible defects. IMPRESSION: 1. Two separate, small, moderate-severity reversible defects, 1 at the anterior apical wall and 1 at the base apical wall. 2. Normal left ventricular wall motion. 3. Left ventricular ejection fraction 68% 4. Moderate-risk stress test findings*. *2012 Appropriate Use Criteria for Coronary Revascularization Focused Update: J Am Coll Cardiol. N6492421. [email or web address]://[email or web address] Electronically Signed   By: Corrie Mckusick D.O.   On: 02/02/2016 17:41  02/02/2016  CLINICAL DATA:  81 year old female with a history of chest pain. Cardiovascular risk factors include known coronary artery disease, hypertension. EXAM: MYOCARDIAL IMAGING WITH SPECT (REST AND  PHARMACOLOGIC-STRESS) GATED LEFT VENTRICULAR WALL MOTION STUDY LEFT VENTRICULAR EJECTION FRACTION TECHNIQUE: Standard myocardial SPECT imaging was performed after resting intravenous injection of 10 mCi Tc-70m sestamibi. Subsequently, intravenous infusion of Lexiscan was performed under the supervision of the Cardiology staff. At peak effect of the drug, 30 mCi Tc-84m sestamibi was injected intravenously and standard myocardial SPECT imaging was performed. Quantitative gated imaging was also performed to evaluate left ventricular wall motion, and estimate left ventricular ejection fraction. COMPARISON:  None. FINDINGS: Perfusion: There are 2 small defects, moderate severity at the apex. One anterior wall, 1 at the base. Wall Motion: Normal left ventricular wall motion. No left ventricular dilation. Left Ventricular Ejection Fraction: 68 % End diastolic volume 60 ml End systolic volume 19 ml IMPRESSION: 1. Two separate defects, small size moderate severity at the anterior apical wall and base apical wall. 2. Normal left ventricular wall motion. 3. Left ventricular ejection fraction 68% 4. Moderate-risk stress test findings*. *2012 Appropriate Use Criteria for Coronary Revascularization Focused Update: J Am Coll Cardiol. N6492421. http://[email or web address]?[email or web address] Electronically Signed: By: Corrie Mckusick D.O. On: 02/02/2016 16:17    Scheduled Meds: . [MAR Hold] amiodarone  200 mg Oral Daily  . [MAR Hold] amLODipine  5 mg Oral Daily  . [MAR Hold] aspirin EC  81 mg Oral Daily  . [START ON 02/04/2016] atorvastatin  40 mg Oral Q0600  . [MAR Hold] erythromycin  500 mg Oral TID AC  . [MAR Hold] escitalopram  40 mg Oral Daily  . [MAR Hold] levothyroxine  88 mcg Oral QAC breakfast  . [MAR Hold] pantoprazole  40 mg Oral BID  . [MAR Hold] sodium chloride flush  3 mL Intravenous Q12H  . sodium chloride flush  3 mL Intravenous Q12H   Continuous Infusions: . sodium chloride 500 mL  (02/03/16 1607)    Time spent:   [facility] [email or web address], password [facility] 02/03/2016, 4:53 PM

## 2016-02-03 NOTE — Progress Notes (Signed)
Report received via Merleen Nicely RN in patient's room using SBAR format, reviewed orders, labs, VS, meds, tests and patient's general condition, assumed care of patient.

## 2016-02-04 ENCOUNTER — Encounter (HOSPITAL_COMMUNITY): Payer: Self-pay | Admitting: Cardiology

## 2016-02-04 DIAGNOSIS — N179 Acute kidney failure, unspecified: Secondary | ICD-10-CM | POA: Diagnosis not present

## 2016-02-04 DIAGNOSIS — I480 Paroxysmal atrial fibrillation: Secondary | ICD-10-CM | POA: Diagnosis not present

## 2016-02-04 DIAGNOSIS — E860 Dehydration: Secondary | ICD-10-CM | POA: Diagnosis not present

## 2016-02-04 DIAGNOSIS — I119 Hypertensive heart disease without heart failure: Secondary | ICD-10-CM | POA: Diagnosis not present

## 2016-02-04 DIAGNOSIS — K219 Gastro-esophageal reflux disease without esophagitis: Secondary | ICD-10-CM

## 2016-02-04 DIAGNOSIS — E785 Hyperlipidemia, unspecified: Secondary | ICD-10-CM | POA: Diagnosis not present

## 2016-02-04 DIAGNOSIS — R079 Chest pain, unspecified: Secondary | ICD-10-CM | POA: Diagnosis not present

## 2016-02-04 DIAGNOSIS — R0789 Other chest pain: Secondary | ICD-10-CM

## 2016-02-04 DIAGNOSIS — E039 Hypothyroidism, unspecified: Secondary | ICD-10-CM | POA: Diagnosis not present

## 2016-02-04 LAB — CBC
HCT: 34.7 % — ABNORMAL LOW (ref 36.0–46.0)
Hemoglobin: 11 g/dL — ABNORMAL LOW (ref 12.0–15.0)
MCH: 30.1 pg (ref 26.0–34.0)
MCHC: 31.7 g/dL (ref 30.0–36.0)
MCV: 95.1 fL (ref 78.0–100.0)
PLATELETS: 182 10*3/uL (ref 150–400)
RBC: 3.65 MIL/uL — ABNORMAL LOW (ref 3.87–5.11)
RDW: 13.4 % (ref 11.5–15.5)
WBC: 7.7 10*3/uL (ref 4.0–10.5)

## 2016-02-04 MED ORDER — APIXABAN 2.5 MG PO TABS: 2.5000 mg | ORAL_TABLET | Freq: Two times a day (BID) | ORAL | Status: DC

## 2016-02-04 MED ORDER — APIXABAN 2.5 MG PO TABS
2.5000 mg | ORAL_TABLET | Freq: Two times a day (BID) | ORAL | Status: DC
  Administered 2016-02-04: 2.5 mg via ORAL
  Filled 2016-02-04: qty 1

## 2016-02-04 NOTE — Discharge Summary (Signed)
PATIENT DETAILS Name: Erica [name] Age: 82 y.o. Sex: female Date of Birth: 05-12-1930 MRN: [id number]. Admitting Physician: Reubin Milan, MD PO:[id number] MARGARET, [name]  Admit Date: 01/31/2016 Discharge date: 02/04/2016  Recommendations for Outpatient Follow-up:  1. New medication: Eliquis 2. Please repeat CBC/BMET at next visit  PRIMARY DISCHARGE DIAGNOSIS:  Principal Problem:   Chest pressure Active Problems:   GERD (gastroesophageal reflux disease)   Gastroparesis   Depression   HTN (hypertension)   Hyperlipidemia   Chest pain   AKI (acute kidney injury) ([place])   Dehydration      PAST MEDICAL HISTORY: Past Medical History  Diagnosis Date  . MVP (mitral valve prolapse)   . Atrial fibrillation ([place])   . Hyperlipidemia   . Hypothyroid   . GERD (gastroesophageal reflux disease)   . Gastroparesis   . Hypertension   . Chronic back pain   . Adenomatous rectal polyp   . Bleeding internal hemorrhoids   . Lichen sclerosus et atrophicus of the vulva   . Diverticulosis of colon (without mention of hemorrhage)   . Hiatal hernia 09/1999    3cm  . TIA (transient ischemic attack)   . Ischemic colitis ([place])     DISCHARGE MEDICATIONS: Current Discharge Medication List    START taking these medications   Details  apixaban (ELIQUIS) 2.5 MG TABS tablet Take 1 tablet (2.5 mg total) by mouth 2 (two) times daily. Qty: 60 tablet, Refills: 0      CONTINUE these medications which have NOT CHANGED   Details  ALPRAZolam (XANAX) 0.5 MG tablet Take 0.25 mg by mouth at bedtime as needed for anxiety.     amiodarone (PACERONE) 200 MG tablet Take 1 tablet (200 mg total) by mouth daily. Qty: 90 tablet, Refills: 1   Associated Diagnoses: Need for 23-polyvalent pneumococcal polysaccharide vaccine    amLODipine (NORVASC) 5 MG tablet TAKE 1 TABLET (5 MG TOTAL) BY MOUTH DAILY. Qty: 90 tablet, Refills: 1    bethanechol (URECHOLINE) 10 MG tablet Take 2 tablets (20 mg total) by  mouth at bedtime as needed. Qty: 60 tablet, Refills: 3   Associated Diagnoses: Gastroparesis    Cholecalciferol (VITAMIN D PO) Take 1 tablet by mouth daily.    erythromycin (E-MYCIN) 500 MG tablet Take 30 min ac for gastric motility Qty: 90 tablet, Refills: 2   Associated Diagnoses: Gastroparesis    escitalopram (LEXAPRO) 20 MG tablet 2 po Qd Qty: 60 tablet, Refills: 5   Associated Diagnoses: Depression    EVENING PRIMROSE OIL PO Take 1 capsule by mouth daily.    furosemide (LASIX) 20 MG tablet Take 1 tablet (20 mg total) by mouth daily. Qty: 90 tablet, Refills: 5   Associated Diagnoses: Essential hypertension    levothyroxine (SYNTHROID, LEVOTHROID) 88 MCG tablet Take 88 mcg by mouth daily before breakfast.    Melatonin 5 MG CAPS Take 5 mg by mouth at bedtime as needed (sleep).    Multiple Vitamin (MULTIVITAMIN WITH MINERALS) TABS tablet Take 1 tablet by mouth daily.    Omega-3 Fatty Acids (FISH OIL PO) Take 1 tablet by mouth daily.    omeprazole-sodium bicarbonate (ZEGERID) 40-1100 MG capsule TAKE 1 CAPSULE BY MOUTH TWICE A DAY BEFORE BREAKFAST AND AT BEDTIME Qty: 60 capsule, Refills: 2      STOP taking these medications     aspirin 81 MG tablet      naproxen sodium (ANAPROX) 220 MG tablet      olmesartan (BENICAR) 40 MG tablet  ondansetron (ZOFRAN) 4 MG tablet      spironolactone (ALDACTONE) 25 MG tablet      VITAMIN E PO         ALLERGIES:   Allergies  Allergen Reactions  . Contrast Media [Iodinated Diagnostic Agents] Shortness Of Breath, Nausea Only and Swelling  . Iodine Shortness Of Breath, Nausea Only and Swelling  . Iohexol Hives and Swelling     Desc: hives, swelling over 5 yrs ago-pt has never been pre-medicated--was told to not have iv contrast ever   . Metoclopramide Other (See Comments)    Mouth tremors, insomnia, and irritability    BRIEF HPI:  See H&P, Labs, Consult and Test reports for all details in brief, patient was admitted for  evaluation of chest pain   CONSULTATIONS:   cardiology  PERTINENT RADIOLOGIC STUDIES: Dg Chest 2 View  01/31/2016  CLINICAL DATA:  Chest pain and nausea EXAM: CHEST  2 VIEW COMPARISON:  05/15/2014 FINDINGS: Normal heart size and mediastinal contours. No acute infiltrate or edema. No effusion or pneumothorax. No acute osseous findings. Cholecystectomy changes. IMPRESSION: No active cardiopulmonary disease. Electronically Signed   By: Monte Fantasia M.D.   On: 01/31/2016 23:31   Nm Myocar Multi W/spect W/wall Motion / Ef  02/02/2016  ADDENDUM REPORT: 02/02/2016 17:41 ADDENDUM: Addendum created to clarify reversible defects. IMPRESSION: 1. Two separate, small, moderate-severity reversible defects, 1 at the anterior apical wall and 1 at the base apical wall. 2. Normal left ventricular wall motion. 3. Left ventricular ejection fraction 68% 4. Moderate-risk stress test findings*. *2012 Appropriate Use Criteria for Coronary Revascularization Focused Update: J Am Coll Cardiol. N6492421. http://[email or web address]?[email or web address] Electronically Signed   By: Corrie Mckusick D.O.   On: 02/02/2016 17:41  02/02/2016  CLINICAL DATA:  82 year old female with a history of chest pain. Cardiovascular risk factors include known coronary artery disease, hypertension. EXAM: MYOCARDIAL IMAGING WITH SPECT (REST AND PHARMACOLOGIC-STRESS) GATED LEFT VENTRICULAR WALL MOTION STUDY LEFT VENTRICULAR EJECTION FRACTION TECHNIQUE: Standard myocardial SPECT imaging was performed after resting intravenous injection of 10 mCi Tc-17m sestamibi. Subsequently, intravenous infusion of Lexiscan was performed under the supervision of the Cardiology staff. At peak effect of the drug, 30 mCi Tc-21m sestamibi was injected intravenously and standard myocardial SPECT imaging was performed. Quantitative gated imaging was also performed to evaluate left ventricular wall motion, and estimate left ventricular ejection fraction.  COMPARISON:  None. FINDINGS: Perfusion: There are 2 small defects, moderate severity at the apex. One anterior wall, 1 at the base. Wall Motion: Normal left ventricular wall motion. No left ventricular dilation. Left Ventricular Ejection Fraction: 68 % End diastolic volume 60 ml End systolic volume 19 ml IMPRESSION: 1. Two separate defects, small size moderate severity at the anterior apical wall and base apical wall. 2. Normal left ventricular wall motion. 3. Left ventricular ejection fraction 68% 4. Moderate-risk stress test findings*. *2012 Appropriate Use Criteria for Coronary Revascularization Focused Update: J Am Coll Cardiol. N6492421. http://content.[email or web address]?[email or web address] Electronically Signed: By: Corrie Mckusick D.O. On: 02/02/2016 16:17     PERTINENT LAB RESULTS: CBC:  Recent Labs  02/03/16 0641 02/04/16 0345  WBC 8.4 7.7  HGB 11.7* 11.0*  HCT 34.8* 34.7*  PLT 181 182   CMET CMP     Component Value Date/Time   NA 135 02/01/2016 0947   NA 137 12/30/2015 1335   K 4.4 02/01/2016 0947   CL 105 02/01/2016 0947   CO2 21* 02/01/2016 0947   GLUCOSE 111* 02/01/2016 0947   GLUCOSE 99 12/30/2015 1335  BUN 19 02/01/2016 0947   BUN 20 12/30/2015 1335   CREATININE 1.14* 02/01/2016 0947   CALCIUM 8.8* 02/01/2016 0947   PROT 5.7* 02/01/2016 0947   PROT 5.8* 12/30/2015 1335   ALBUMIN 3.2* 02/01/2016 0947   ALBUMIN 3.9 12/30/2015 1335   AST 55* 02/01/2016 0947   ALT 84* 02/01/2016 0947   ALKPHOS 59 02/01/2016 0947   BILITOT 0.4 02/01/2016 0947   BILITOT 0.3 12/30/2015 1335   GFRNONAA 43* 02/01/2016 0947   GFRAA 50* 02/01/2016 0947    GFR Estimated Creatinine Clearance: 33.6 mL/min (by C-G formula based on Cr of 1.14). No results for input(s): LIPASE, AMYLASE in the last 72 hours.  Recent Labs  02/02/16 0607  TROPONINI <0.03   Invalid input(s): POCBNP No results for input(s): DDIMER in the last 72 hours. No results for input(s): HGBA1C in  the last 72 hours.  Recent Labs  02/02/16 0607  CHOL 202*  HDL 53  LDLCALC 133*  TRIG 80  CHOLHDL 3.8   No results for input(s): TSH, T4TOTAL, T3FREE, THYROIDAB in the last 72 hours.  Invalid input(s): FREET3 No results for input(s): VITAMINB12, FOLATE, FERRITIN, TIBC, IRON, RETICCTPCT in the last 72 hours. Coags:  Recent Labs  02/03/16 0641  INR 1.05   Microbiology: Recent Results (from the past 240 hour(s))  Urine culture     Status: None   Collection Time: 01/31/16 11:36 PM  Result Value Ref Range Status   Specimen Description URINE, CLEAN CATCH  Final   Special Requests NONE  Final   Culture 7,000 COLONIES/mL INSIGNIFICANT GROWTH  Final   Report Status 02/02/2016 FINAL  Final     BRIEF HOSPITAL COURSE:   Principal Problem: Chest pain: Likely GI in etiology. However given her known risk factors, she underwent a nuclear stress test which was of moderate risk-subsequently seen by cardiology-Dr. Mariel Sleet underwent cardiac catheterization-which showed nonobstructive CAD. No further recommendations, stable for discharge today-spoke with Dr. Terrence Dupont.  Active Problems: Paroxysmal atrial fibrillation:Chadsvasc score of 6-continue amiodarone for rate control. Cardiology recommends starting Eliquis 2.5 mg twice a day. Stable for outpatient follow-up.  Mild nonobstructive CAD: Found on cardiac catheterization: Medical management.  Gastroesophageal reflux disease: Continue PPI  History of gastroparesis: Currently without any chest pain/vomiting/abdominal pain. Continue usual medications on discharge.  Mild AKI on CKD stage II-III: Likely prerenal azotemia-significantly improved with IV fluids. Creatinine close to usual baseline  Hypertension: Currently very well controlled with just amlodipine-Aldactone and Benicar remain on hold-resume when able  Hypothyroidism: Continue with levothyroxine  TODAY-DAY OF DISCHARGE:  Subjective:   Aero Manasse today has no  headache,no chest abdominal pain,no new weakness tingling or numbness, feels much better wants to go home today.   Objective:   Blood pressure 99/42, pulse 52, temperature 98.1 F (36.7 C), temperature source Oral, resp. rate 16, height 5\' 2"  (1.575 m), weight 67.359 kg (148 lb 8 oz), SpO2 95 %.  Intake/Output Summary (Last 24 hours) at 02/04/16 1026 Last data filed at 02/04/16 1021  Gross per 24 hour  Intake 2976.7 ml  Output   1252 ml  Net 1724.7 ml   Filed Weights   02/02/16 0551 02/03/16 0451 02/04/16 0400  Weight: 66.225 kg (146 lb) 66.86 kg (147 lb 6.4 oz) 67.359 kg (148 lb 8 oz)    Exam Awake Alert, Oriented *3, No new F.N deficits, Normal affect [place].AT,PERRAL Supple Neck,No JVD, No cervical lymphadenopathy appriciated.  Symmetrical Chest wall movement, Good air movement bilaterally, CTAB RRR,No Gallops,Rubs or new Murmurs,  No Parasternal Heave +ve B.Sounds, Abd Soft, Non tender, No organomegaly appriciated, No rebound -guarding or rigidity. No Cyanosis, Clubbing or edema, No new Rash or bruise  DISCHARGE CONDITION: Stable  DISPOSITION: Home  DISCHARGE INSTRUCTIONS:    Activity:  As tolerated with Full fall precautions use walker/cane & assistance as needed  Get Medicines reviewed and adjusted: Please take all your medications with you for your next visit with your Primary MD  Please request your Primary MD to go over all hospital tests and procedure/radiological results at the follow up, please ask your Primary MD to get all Hospital records sent to his/her office.  If you experience worsening of your admission symptoms, develop shortness of breath, life threatening emergency, suicidal or homicidal thoughts you must seek medical attention immediately by calling 911 or calling your MD immediately  if symptoms less severe.  You must read complete instructions/literature along with all the possible adverse reactions/side effects for all the Medicines you take and that  have been prescribed to you. Take any new Medicines after you have completely understood and accpet all the possible adverse reactions/side effects.   Do not drive when taking Pain medications.   Do not take more than prescribed Pain, Sleep and Anxiety Medications  Special Instructions: If you have smoked or chewed Tobacco  in the last 2 yrs please stop smoking, stop any regular Alcohol  and or any Recreational drug use.  Wear Seat belts while driving.  Please note  You were cared for by a hospitalist during your hospital stay. Once you are discharged, your primary care physician will handle any further medical issues. Please note that NO REFILLS for any discharge medications will be authorized once you are discharged, as it is imperative that you return to your primary care physician (or establish a relationship with a primary care physician if you do not have one) for your aftercare needs so that they can reassess your need for medications and monitor your lab values.   Diet recommendation: Heart Healthy diet  Discharge Instructions    Diet - low sodium heart healthy    Complete by:  As directed      Increase activity slowly    Complete by:  As directed            Follow-up Information    Follow up with [name], [name]. Go on 02/12/2016.   Specialty:  Family Medicine   Why:  Hospital follow up @ 9am   Contact information:   [place] [address] [phone]       Follow up with Charolette Forward, MD. Go on 02/12/2016.   Specialty:  Cardiology   Why:  Hospital follow up @330pm    Contact information:   [address] [address] [phone]      Total Time spent on discharge equals  45 minutes.  SignedOren Binet 02/04/2016 10:26 AM

## 2016-02-04 NOTE — Progress Notes (Signed)
Subjective:  During well.  Denies any chest pain or shortness of breath.  Objective:  Vital Signs in the last 24 hours: Temp:  [98.1 F (36.7 C)-98.4 F (36.9 C)] 98.1 F (36.7 C) (02/08 0400) Pulse Rate:  [52-83] 52 (02/07 1849) Resp:  [9-18] 16 (02/08 0400) BP: (93-141)/(36-95) 103/48 mmHg (02/08 0400) SpO2:  [0 %-100 %] 95 % (02/08 0400) Weight:  [67.359 kg (148 lb 8 oz)] 67.359 kg (148 lb 8 oz) (02/08 0400)  Intake/Output from previous day: 02/07 0701 - 02/08 0700 In: 3430.3 [P.O.:600; I.V.:2830.3] Out: 1252 [Urine:1251; Stool:1] Intake/Output from this shift:    Physical Exam: Neck: no adenopathy, no carotid bruit, no JVD and supple, symmetrical, trachea midline Lungs: clear to auscultation bilaterally Heart: regular rate and rhythm, S1, S2 normal and soft systolic murmur noted Abdomen: soft, non-tender; bowel sounds normal; no masses,  no organomegaly Extremities: extremities normal, atraumatic, no cyanosis or edema and right groin stable  Lab Results:  Recent Labs  02/03/16 0641 02/04/16 0345  WBC 8.4 7.7  HGB 11.7* 11.0*  PLT 181 182   No results for input(s): NA, K, CL, CO2, GLUCOSE, BUN, CREATININE in the last 72 hours.  Recent Labs  02/02/16 0607  TROPONINI <0.03   Hepatic Function Panel No results for input(s): PROT, ALBUMIN, AST, ALT, ALKPHOS, BILITOT, BILIDIR, IBILI in the last 72 hours.  Recent Labs  02/02/16 0607  CHOL 202*   No results for input(s): PROTIME in the last 72 hours.  Imaging: Imaging results have been reviewed and Nm Myocar Multi W/spect W/wall Motion / Ef  02/02/2016  ADDENDUM REPORT: 02/02/2016 17:41 ADDENDUM: Addendum created to clarify reversible defects. IMPRESSION: 1. Two separate, small, moderate-severity reversible defects, 1 at the anterior apical wall and 1 at the base apical wall. 2. Normal left ventricular wall motion. 3. Left ventricular ejection fraction 68% 4. Moderate-risk stress test findings*. *2012 Appropriate  Use Criteria for Coronary Revascularization Focused Update: J Am Coll Cardiol. N6492421. http://[email or web address]?[email or web address] Electronically Signed   By: Corrie Mckusick D.O.   On: 02/02/2016 17:41  02/02/2016  CLINICAL DATA:  82 year old female with a history of chest pain. Cardiovascular risk factors include known coronary artery disease, hypertension. EXAM: MYOCARDIAL IMAGING WITH SPECT (REST AND PHARMACOLOGIC-STRESS) GATED LEFT VENTRICULAR WALL MOTION STUDY LEFT VENTRICULAR EJECTION FRACTION TECHNIQUE: Standard myocardial SPECT imaging was performed after resting intravenous injection of 10 mCi Tc-52m sestamibi. Subsequently, intravenous infusion of Lexiscan was performed under the supervision of the Cardiology staff. At peak effect of the drug, 30 mCi Tc-67m sestamibi was injected intravenously and standard myocardial SPECT imaging was performed. Quantitative gated imaging was also performed to evaluate left ventricular wall motion, and estimate left ventricular ejection fraction. COMPARISON:  None. FINDINGS: Perfusion: There are 2 small defects, moderate severity at the apex. One anterior wall, 1 at the base. Wall Motion: Normal left ventricular wall motion. No left ventricular dilation. Left Ventricular Ejection Fraction: 68 % End diastolic volume 60 ml End systolic volume 19 ml IMPRESSION: 1. Two separate defects, small size moderate severity at the anterior apical wall and base apical wall. 2. Normal left ventricular wall motion. 3. Left ventricular ejection fraction 68% 4. Moderate-risk stress test findings*. *2012 Appropriate Use Criteria for Coronary Revascularization Focused Update: J Am Coll Cardiol. N6492421. http://content.[email or web address]?articleid=[email or web address] Electronically Signed: By: Corrie Mckusick D.O. On: 02/02/2016 16:17    Cardiac Studies:  Assessment/Plan:  Atypical chest pain with some features worrisome for angina. MI ruled out  positive nuclear stress test as above Mild  nonobstructive coronary artery disease Hypertensive heart disease with diastolic dysfunction. Hypertension. History of paroxysmal A. Fib.chadsvasc score of 6 History of questionable TIA in the past. Hyperlipidemia. Hypothyroidism. Degenerative joint disease. GERD. Anxiety disorder and/depression.   Plan Okay to discharge from cardiac point of view.  Start Eliquis 2.5 mg twice a day. Postcardiac cath instructions given Follow-up with me in one week Erica [name], Prudencio Burly 02/04/2016, 10:14 AM

## 2016-02-04 NOTE — Progress Notes (Signed)
Patient being discharged home per MD order.

## 2016-02-05 ENCOUNTER — Telehealth: Payer: Self-pay | Admitting: *Deleted

## 2016-02-05 ENCOUNTER — Ambulatory Visit: Payer: Medicare Other | Admitting: Pharmacist

## 2016-02-05 NOTE — Telephone Encounter (Signed)
Call Completed and Appointment Scheduled: Yes, Date: 02/12/16 at 9:00 with [name], [place] Date of Discharge:02/04/16  Discharge Facility: [facility]  Principal Discharge Diagnosis: Chest Pain  Patient and/or caregiver is knowledgeable of his/her condition(s) and treatment: Yes  MEDICATION RECONCILIATION Medication list reviewed with patient:Yes Started on Eliquis 5mg  BID. No other changes.   Patient is able to obtain needed medications:Yes New meds were delivered by [facility] today/  [place]  Is the patient able to perform his/her own ADLs: Yes.    Patient is receiving home health services: No.  PATIENT EDUCATION Questions/Concerns Discussed: Patient has some concerns about taking Eliquis. Explained it use and reviewed possible side effects. She will report if she has any. Also cautioned her to move slowly to avoid falls. She should let us know if she has any falls.

## 2016-02-11 DIAGNOSIS — E039 Hypothyroidism, unspecified: Secondary | ICD-10-CM | POA: Diagnosis not present

## 2016-02-11 DIAGNOSIS — I480 Paroxysmal atrial fibrillation: Secondary | ICD-10-CM | POA: Diagnosis not present

## 2016-02-11 DIAGNOSIS — E785 Hyperlipidemia, unspecified: Secondary | ICD-10-CM | POA: Diagnosis not present

## 2016-02-11 DIAGNOSIS — I10 Essential (primary) hypertension: Secondary | ICD-10-CM | POA: Diagnosis not present

## 2016-02-11 DIAGNOSIS — I2510 Atherosclerotic heart disease of native coronary artery without angina pectoris: Secondary | ICD-10-CM | POA: Diagnosis not present

## 2016-02-11 DIAGNOSIS — M1990 Unspecified osteoarthritis, unspecified site: Secondary | ICD-10-CM | POA: Diagnosis not present

## 2016-02-12 ENCOUNTER — Ambulatory Visit: Payer: Medicare Other | Admitting: Nurse Practitioner

## 2016-03-11 ENCOUNTER — Other Ambulatory Visit: Payer: Self-pay | Admitting: Family Medicine

## 2016-03-11 NOTE — Telephone Encounter (Signed)
Last seen 11/17/15  MMM

## 2016-03-24 ENCOUNTER — Ambulatory Visit (INDEPENDENT_AMBULATORY_CARE_PROVIDER_SITE_OTHER): Payer: Medicare Other | Admitting: Nurse Practitioner

## 2016-03-24 ENCOUNTER — Encounter: Payer: Self-pay | Admitting: Nurse Practitioner

## 2016-03-24 VITALS — BP 101/62 | HR 52 | Temp 96.9°F | Ht 62.0 in | Wt 150.2 lb

## 2016-03-24 DIAGNOSIS — K219 Gastro-esophageal reflux disease without esophagitis: Secondary | ICD-10-CM

## 2016-03-24 DIAGNOSIS — I2510 Atherosclerotic heart disease of native coronary artery without angina pectoris: Secondary | ICD-10-CM

## 2016-03-24 DIAGNOSIS — E785 Hyperlipidemia, unspecified: Secondary | ICD-10-CM | POA: Diagnosis not present

## 2016-03-24 DIAGNOSIS — Z6827 Body mass index (BMI) 27.0-27.9, adult: Secondary | ICD-10-CM | POA: Diagnosis not present

## 2016-03-24 DIAGNOSIS — F329 Major depressive disorder, single episode, unspecified: Secondary | ICD-10-CM | POA: Diagnosis not present

## 2016-03-24 DIAGNOSIS — F32A Depression, unspecified: Secondary | ICD-10-CM

## 2016-03-24 DIAGNOSIS — Z23 Encounter for immunization: Secondary | ICD-10-CM | POA: Diagnosis not present

## 2016-03-24 DIAGNOSIS — E039 Hypothyroidism, unspecified: Secondary | ICD-10-CM | POA: Diagnosis not present

## 2016-03-24 DIAGNOSIS — I10 Essential (primary) hypertension: Secondary | ICD-10-CM | POA: Diagnosis not present

## 2016-03-24 MED ORDER — LEVOTHYROXINE SODIUM 88 MCG PO TABS: 88.0000 ug | ORAL_TABLET | Freq: Every day | ORAL | Status: DC

## 2016-03-24 MED ORDER — ESCITALOPRAM OXALATE 20 MG PO TABS: 40.0000 mg | ORAL_TABLET | Freq: Every day | ORAL | Status: DC

## 2016-03-24 MED ORDER — OMEPRAZOLE-SODIUM BICARBONATE 40-1100 MG PO CAPS: ORAL_CAPSULE | ORAL | Status: DC

## 2016-03-24 MED ORDER — APIXABAN 2.5 MG PO TABS: 2.5000 mg | ORAL_TABLET | Freq: Two times a day (BID) | ORAL | Status: DC

## 2016-03-24 MED ORDER — AMLODIPINE BESYLATE 5 MG PO TABS: ORAL_TABLET | ORAL | Status: DC

## 2016-03-24 MED ORDER — AMIODARONE HCL 200 MG PO TABS: 200.0000 mg | ORAL_TABLET | Freq: Every day | ORAL | Status: DC

## 2016-03-24 MED ORDER — FUROSEMIDE 20 MG PO TABS: 20.0000 mg | ORAL_TABLET | Freq: Every day | ORAL | Status: DC | PRN

## 2016-03-24 NOTE — Addendum Note (Signed)
Addended by: [name] on: 03/24/2016 02:39 PM   Modules accepted: Orders, SmartSet

## 2016-03-24 NOTE — Progress Notes (Signed)
Subjective:    Patient ID: Erica [name], female    DOB: [birth year], 82 y.o.   MRN: [id number]   Patient here today for follow up of chronic medical problems.  Outpatient Encounter Prescriptions as of 03/24/2016  Medication Sig  . ALPRAZolam (XANAX) 0.5 MG tablet Take 0.25 mg by mouth at bedtime as needed for anxiety.   Marland Kitchen amiodarone (PACERONE) 200 MG tablet Take 1 tablet (200 mg total) by mouth daily.  Marland Kitchen amLODipine (NORVASC) 5 MG tablet TAKE 1 TABLET (5 MG TOTAL) BY MOUTH DAILY.  Marland Kitchen apixaban (ELIQUIS) 2.5 MG TABS tablet Take 1 tablet (2.5 mg total) by mouth 2 (two) times daily.  . bethanechol (URECHOLINE) 10 MG tablet Take 2 tablets (20 mg total) by mouth at bedtime as needed. (Patient taking differently: Take 20 mg by mouth at bedtime as needed (gastric problems). )  . Cholecalciferol (VITAMIN D PO) Take 1 tablet by mouth daily.  Marland Kitchen erythromycin base (E-MYCIN) 500 MG tablet TAKE (1) TABLET THREE TIMES DAILY BEFORE MEALS FOR GASTRIC MOTILITY  . escitalopram (LEXAPRO) 20 MG tablet 2 po Qd (Patient taking differently: Take 40 mg by mouth daily. )  . EVENING PRIMROSE OIL PO Take 1 capsule by mouth daily.  . furosemide (LASIX) 20 MG tablet Take 1 tablet (20 mg total) by mouth daily. (Patient taking differently: Take 20 mg by mouth daily as needed for fluid or edema. )  . levothyroxine (SYNTHROID, LEVOTHROID) 88 MCG tablet Take 88 mcg by mouth daily before breakfast.  . Melatonin 5 MG CAPS Take 5 mg by mouth at bedtime as needed (sleep).  . Multiple Vitamin (MULTIVITAMIN WITH MINERALS) TABS tablet Take 1 tablet by mouth daily.  . Omega-3 Fatty Acids (FISH OIL PO) Take 1 tablet by mouth daily.  Marland Kitchen omeprazole-sodium bicarbonate (ZEGERID) 40-1100 MG capsule TAKE 1 CAPSULE BY MOUTH TWICE A DAY BEFORE BREAKFAST AND AT BEDTIME   No facility-administered encounter medications on file as of 03/24/2016.     Hypertension This is a chronic problem. The current episode started more than 1 year ago. The  problem is unchanged. The problem is controlled. Risk factors for coronary artery disease include dyslipidemia, obesity, post-menopausal state and smoking/tobacco exposure. Past treatments include calcium channel blockers and angiotensin blockers. The current treatment provides mild improvement. Compliance problems include diet and exercise.  Hypertensive end-organ damage includes a thyroid problem. There is no history of CAD/MI or CVA.  Hyperlipidemia This is a chronic problem. The current episode started more than 1 year ago. The problem is controlled. Recent lipid tests were reviewed and are normal. Exacerbating diseases include obesity. She has no history of diabetes or hypothyroidism. The current treatment provides moderate improvement of lipids. Compliance problems include adherence to diet and adherence to exercise.  Risk factors for coronary artery disease include dyslipidemia, hypertension, obesity and post-menopausal.  Thyroid Problem Presents for follow-up (hypothyroidism) visit. The symptoms have been stable. Her past medical history is significant for hyperlipidemia. There is no history of diabetes.  GERD zegrid- works well for her- depression  lexapro working well to keep her symptoms under control- keeps her from worrying so much. GAD She is still grieving the loss of her son several months ago- she says she still cries everyday.  * had cardiac cath this past month and had 30% blockage- started her on eliquis   Review of Systems  Constitutional: Negative.   HENT: Negative.   Respiratory: Negative.   Cardiovascular: Negative.   Gastrointestinal: Negative.   Genitourinary:  Negative.   Neurological: Negative.   Psychiatric/Behavioral: Negative.   All other systems reviewed and are negative.      Objective:   Physical Exam  Constitutional: She is oriented to person, place, and time. She appears well-developed and well-nourished.  HENT:  Nose: Nose normal.  Mouth/Throat:  Oropharynx is clear and moist.  Eyes: EOM are normal.  Neck: Trachea normal, normal range of motion and full passive range of motion without pain. Neck supple. No JVD present. Carotid bruit is not present. No thyromegaly present.  Cardiovascular: Normal rate, regular rhythm, normal heart sounds and intact distal pulses.  Exam reveals no gallop and no friction rub.   No murmur heard. Pulmonary/Chest: Effort normal and breath sounds normal.  Abdominal: Soft. Bowel sounds are normal. She exhibits no distension and no mass. There is no tenderness.  Musculoskeletal: Normal range of motion.  Lymphadenopathy:    She has no cervical adenopathy.  Neurological: She is alert and oriented to person, place, and time. She has normal reflexes.  Skin: Skin is warm and dry.  Psychiatric: She has a normal mood and affect. Her behavior is normal. Judgment and thought content normal.   BP 101/62 mmHg  Pulse 52  Temp(Src) 96.9 F (36.1 C) (Oral)  Ht _0  (1.575 m)  Wt 150 lb 3.2 oz (68.13 kg)  BMI 27.46 kg/m2      Assessment & Plan:  1. Essential hypertension Do not add salt to diet - furosemide (LASIX) 20 MG tablet; Take 1 tablet (20 mg total) by mouth daily as needed for fluid or edema.  Dispense: 90 tablet; Refill: 1 - amLODipine (NORVASC) 5 MG tablet; TAKE 1 TABLET (5 MG TOTAL) BY MOUTH DAILY.  Dispense: 90 tablet; Refill: 1 - CMP14+EGFR  2. Gastroesophageal reflux disease without esophagitis Avoid spicy foods Do not eat 2 hours prior to bedtime - omeprazole-sodium bicarbonate (ZEGERID) 40-1100 MG capsule; TAKE 1 CAPSULE BY MOUTH TWICE A DAY BEFORE BREAKFAST AND AT BEDTIME  Dispense: 60 capsule; Refill: 5  3. Depression Stress management - escitalopram (LEXAPRO) 20 MG tablet; Take 2 tablets (40 mg total) by mouth daily.  Dispense: 60 tablet; Refill: 5  4. Hyperlipidemia Low fat diet - Lipid panel  5. BMI 27.0-27.9,adult Discussed diet and exercise for person with BMI >25 Will recheck  weight in 3-6 months    6. Hypothyroidism, unspecified hypothyroidism type - levothyroxine (SYNTHROID, LEVOTHROID) 88 MCG tablet; Take 1 tablet (88 mcg total) by mouth daily before breakfast.  Dispense: 30 tablet; Refill: 5 - Thyroid Panel With TSH  7. Coronary artery disease involving native coronary artery of native heart without angina pectoris - apixaban (ELIQUIS) 2.5 MG TABS tablet; Take 1 tablet (2.5 mg total) by mouth 2 (two) times daily.  Dispense: 60 tablet; Refill: 0 - amiodarone (PACERONE) 200 MG tablet; Take 1 tablet (200 mg total) by mouth daily.  Dispense: 90 tablet; Refill: 1   Labs pending Health maintenance reviewed Diet and exercise encouraged Continue all meds Follow up  In 3 month   [place], [name]

## 2016-03-24 NOTE — Patient Instructions (Signed)
Fall Prevention in the Home  Falls can cause injuries and can affect people from all age groups. There are many simple things that you can do to make your home safe and to help prevent falls. WHAT CAN I DO ON THE OUTSIDE OF MY HOME?  Regularly repair the edges of walkways and driveways and fix any cracks.  Remove high doorway thresholds.  Trim any shrubbery on the main path into your home.  Use bright outdoor lighting.  Clear walkways of debris and clutter, including tools and rocks.  Regularly check that handrails are securely fastened and in good repair. Both sides of any steps should have handrails.  Install guardrails along the edges of any raised decks or porches.  Have leaves, snow, and ice cleared regularly.  Use sand or salt on walkways during winter months.  In the garage, clean up any spills right away, including grease or oil spills. WHAT CAN I DO IN THE BATHROOM?  Use night lights.  Install grab bars by the toilet and in the tub and shower. Do not use towel bars as grab bars.  Use non-skid mats or decals on the floor of the tub or shower.  If you need to sit down while you are in the shower, use a plastic, non-slip stool..  Keep the floor dry. Immediately clean up any water that spills on the floor.  Remove soap buildup in the tub or shower on a regular basis.  Attach bath mats securely with double-sided non-slip rug tape.  Remove throw rugs and other tripping hazards from the floor. WHAT CAN I DO IN THE BEDROOM?  Use night lights.  Make sure that a bedside light is easy to reach.  Do not use oversized bedding that drapes onto the floor.  Have a firm chair that has side arms to use for getting dressed.  Remove throw rugs and other tripping hazards from the floor. WHAT CAN I DO IN THE KITCHEN?   Clean up any spills right away.  Avoid walking on wet floors.  Place frequently used items in easy-to-reach places.  If you need to reach for something  above you, use a sturdy step stool that has a grab bar.  Keep electrical cables out of the way.  Do not use floor polish or wax that makes floors slippery. If you have to use wax, make sure that it is non-skid floor wax.  Remove throw rugs and other tripping hazards from the floor. WHAT CAN I DO IN THE STAIRWAYS?  Do not leave any items on the stairs.  Make sure that there are handrails on both sides of the stairs. Fix handrails that are broken or loose. Make sure that handrails are as long as the stairways.  Check any carpeting to make sure that it is firmly attached to the stairs. Fix any carpet that is loose or worn.  Avoid having throw rugs at the top or bottom of stairways, or secure the rugs with carpet tape to prevent them from moving.  Make sure that you have a light switch at the top of the stairs and the bottom of the stairs. If you do not have them, have them installed. WHAT ARE SOME OTHER FALL PREVENTION TIPS?  Wear closed-toe shoes that fit well and support your feet. Wear shoes that have rubber soles or low heels.  When you use a stepladder, make sure that it is completely opened and that the sides are firmly locked. Have someone hold the ladder while you   are using it. Do not climb a closed stepladder.  Add color or contrast paint or tape to grab bars and handrails in your home. Place contrasting color strips on the first and last steps.  Use mobility aids as needed, such as canes, walkers, scooters, and crutches.  Turn on lights if it is dark. Replace any light bulbs that burn out.  Set up furniture so that there are clear paths. Keep the furniture in the same spot.  Fix any uneven floor surfaces.  Choose a carpet design that does not hide the edge of steps of a stairway.  Be aware of any and all pets.  Review your medicines with your healthcare provider. Some medicines can cause dizziness or changes in blood pressure, which increase your risk of falling. Talk  with your health care provider about other ways that you can decrease your risk of falls. This may include working with a physical therapist or trainer to improve your strength, balance, and endurance.   This information is not intended to replace advice given to you by your health care provider. Make sure you discuss any questions you have with your health care provider.   Document Released: 12/03/2002 Document Revised: 04/29/2015 Document Reviewed: 01/17/2015 [employer] Interactive Patient Education 2016 [employer]  

## 2016-03-25 LAB — CMP14+EGFR
A/G RATIO: 1.7 (ref 1.2–2.2)
ALBUMIN: 3.8 g/dL (ref 3.5–4.7)
ALT: 122 IU/L — ABNORMAL HIGH (ref 0–32)
AST: 83 IU/L — ABNORMAL HIGH (ref 0–40)
Alkaline Phosphatase: 82 IU/L (ref 39–117)
BUN / CREAT RATIO: 20 (ref 11–26)
BUN: 24 mg/dL (ref 8–27)
Bilirubin Total: 0.3 mg/dL (ref 0.0–1.2)
CALCIUM: 9.1 mg/dL (ref 8.7–10.3)
CO2: 21 mmol/L (ref 18–29)
CREATININE: 1.18 mg/dL — AB (ref 0.57–1.00)
Chloride: 98 mmol/L (ref 96–106)
GFR, EST AFRICAN AMERICAN: 49 mL/min/{1.73_m2} — AB (ref >59)
GFR, EST NON AFRICAN AMERICAN: 43 mL/min/{1.73_m2} — AB (ref >59)
GLOBULIN, TOTAL: 2.3 g/dL (ref 1.5–4.5)
Glucose: 95 mg/dL (ref 65–99)
POTASSIUM: 4.8 mmol/L (ref 3.5–5.2)
SODIUM: 136 mmol/L (ref 134–144)
Total Protein: 6.1 g/dL (ref 6.0–8.5)

## 2016-03-25 LAB — LIPID PANEL
CHOL/HDL RATIO: 4.3 ratio (ref 0.0–4.4)
Cholesterol, Total: 175 mg/dL (ref 100–199)
HDL: 41 mg/dL (ref >39)
LDL CALC: 99 mg/dL (ref 0–99)
Triglycerides: 175 mg/dL — ABNORMAL HIGH (ref 0–149)
VLDL Cholesterol Cal: 35 mg/dL (ref 5–40)

## 2016-03-25 LAB — THYROID PANEL WITH TSH
Free Thyroxine Index: 3.2 (ref 1.2–4.9)
T3 UPTAKE RATIO: 30 % (ref 24–39)
T4 TOTAL: 10.7 ug/dL (ref 4.5–12.0)
TSH: 3.05 u[IU]/mL (ref 0.450–4.500)

## 2016-04-07 DIAGNOSIS — K219 Gastro-esophageal reflux disease without esophagitis: Secondary | ICD-10-CM | POA: Diagnosis not present

## 2016-04-07 DIAGNOSIS — K1370 Unspecified lesions of oral mucosa: Secondary | ICD-10-CM | POA: Diagnosis not present

## 2016-04-07 DIAGNOSIS — F458 Other somatoform disorders: Secondary | ICD-10-CM | POA: Diagnosis not present

## 2016-04-09 ENCOUNTER — Other Ambulatory Visit: Payer: Self-pay | Admitting: Nurse Practitioner

## 2016-04-12 NOTE — Telephone Encounter (Signed)
Last seen 03/24/16  MMM

## 2016-04-21 ENCOUNTER — Emergency Department (HOSPITAL_COMMUNITY): Payer: Medicare Other

## 2016-04-21 ENCOUNTER — Emergency Department (HOSPITAL_COMMUNITY)
Admission: EM | Admit: 2016-04-21 | Discharge: 2016-04-21 | Disposition: A | Discharge status: Home / Self Care | Payer: Medicare Other | Attending: Emergency Medicine | Admitting: Emergency Medicine

## 2016-04-21 ENCOUNTER — Encounter (HOSPITAL_COMMUNITY): Payer: Self-pay | Admitting: *Deleted

## 2016-04-21 DIAGNOSIS — Y9289 Other specified places as the place of occurrence of the external cause: Secondary | ICD-10-CM | POA: Insufficient documentation

## 2016-04-21 DIAGNOSIS — J3489 Other specified disorders of nose and nasal sinuses: Secondary | ICD-10-CM | POA: Diagnosis not present

## 2016-04-21 DIAGNOSIS — G8929 Other chronic pain: Secondary | ICD-10-CM | POA: Insufficient documentation

## 2016-04-21 DIAGNOSIS — E039 Hypothyroidism, unspecified: Secondary | ICD-10-CM | POA: Insufficient documentation

## 2016-04-21 DIAGNOSIS — W01198A Fall on same level from slipping, tripping and stumbling with subsequent striking against other object, initial encounter: Secondary | ICD-10-CM | POA: Insufficient documentation

## 2016-04-21 DIAGNOSIS — I10 Essential (primary) hypertension: Secondary | ICD-10-CM | POA: Insufficient documentation

## 2016-04-21 DIAGNOSIS — W19XXXA Unspecified fall, initial encounter: Secondary | ICD-10-CM

## 2016-04-21 DIAGNOSIS — Z8673 Personal history of transient ischemic attack (TIA), and cerebral infarction without residual deficits: Secondary | ICD-10-CM | POA: Diagnosis not present

## 2016-04-21 DIAGNOSIS — K219 Gastro-esophageal reflux disease without esophagitis: Secondary | ICD-10-CM | POA: Diagnosis not present

## 2016-04-21 DIAGNOSIS — Z79899 Other long term (current) drug therapy: Secondary | ICD-10-CM | POA: Insufficient documentation

## 2016-04-21 DIAGNOSIS — S0083XA Contusion of other part of head, initial encounter: Secondary | ICD-10-CM | POA: Insufficient documentation

## 2016-04-21 DIAGNOSIS — Y998 Other external cause status: Secondary | ICD-10-CM | POA: Diagnosis not present

## 2016-04-21 DIAGNOSIS — S022XXA Fracture of nasal bones, initial encounter for closed fracture: Secondary | ICD-10-CM | POA: Insufficient documentation

## 2016-04-21 DIAGNOSIS — S0181XA Laceration without foreign body of other part of head, initial encounter: Secondary | ICD-10-CM | POA: Diagnosis not present

## 2016-04-21 DIAGNOSIS — Y9389 Activity, other specified: Secondary | ICD-10-CM | POA: Diagnosis not present

## 2016-04-21 DIAGNOSIS — S0120XA Unspecified open wound of nose, initial encounter: Secondary | ICD-10-CM | POA: Diagnosis not present

## 2016-04-21 DIAGNOSIS — S0993XA Unspecified injury of face, initial encounter: Secondary | ICD-10-CM | POA: Diagnosis present

## 2016-04-21 DIAGNOSIS — S098XXA Other specified injuries of head, initial encounter: Secondary | ICD-10-CM | POA: Diagnosis not present

## 2016-04-21 DIAGNOSIS — E785 Hyperlipidemia, unspecified: Secondary | ICD-10-CM | POA: Insufficient documentation

## 2016-04-21 DIAGNOSIS — S0990XA Unspecified injury of head, initial encounter: Secondary | ICD-10-CM | POA: Diagnosis not present

## 2016-04-21 LAB — CBC WITH DIFFERENTIAL/PLATELET
BASOS ABS: 0.1 10*3/uL (ref 0.0–0.1)
BASOS PCT: 1 %
EOS PCT: 1 %
Eosinophils Absolute: 0.1 10*3/uL (ref 0.0–0.7)
HCT: 39.6 % (ref 36.0–46.0)
Hemoglobin: 12.8 g/dL (ref 12.0–15.0)
LYMPHS PCT: 19 %
Lymphs Abs: 1.8 10*3/uL (ref 0.7–4.0)
MCH: 30 pg (ref 26.0–34.0)
MCHC: 32.3 g/dL (ref 30.0–36.0)
MCV: 93 fL (ref 78.0–100.0)
Monocytes Absolute: 1 10*3/uL (ref 0.1–1.0)
Monocytes Relative: 11 %
NEUTROS ABS: 6.6 10*3/uL (ref 1.7–7.7)
Neutrophils Relative %: 68 %
PLATELETS: 214 10*3/uL (ref 150–400)
RBC: 4.26 MIL/uL (ref 3.87–5.11)
RDW: 13.2 % (ref 11.5–15.5)
WBC: 9.6 10*3/uL (ref 4.0–10.5)

## 2016-04-21 LAB — BASIC METABOLIC PANEL
ANION GAP: 9 (ref 5–15)
BUN: 24 mg/dL — ABNORMAL HIGH (ref 6–20)
CALCIUM: 9.7 mg/dL (ref 8.9–10.3)
CO2: 23 mmol/L (ref 22–32)
Chloride: 106 mmol/L (ref 101–111)
Creatinine, Ser: 1.15 mg/dL — ABNORMAL HIGH (ref 0.44–1.00)
GFR, EST AFRICAN AMERICAN: 50 mL/min — AB (ref >60)
GFR, EST NON AFRICAN AMERICAN: 43 mL/min — AB (ref >60)
GLUCOSE: 107 mg/dL — AB (ref 65–99)
Potassium: 5.2 mmol/L — ABNORMAL HIGH (ref 3.5–5.1)
Sodium: 138 mmol/L (ref 135–145)

## 2016-04-21 LAB — PROTIME-INR
INR: 1.21 (ref 0.00–1.49)
Prothrombin Time: 15.5 seconds — ABNORMAL HIGH (ref 11.6–15.2)

## 2016-04-21 NOTE — ED Provider Notes (Signed)
CSN: [id number]     Arrival date & time 04/21/16  1428 History   First MD Initiated Contact with Patient 04/21/16 1458     Chief Complaint  Patient presents with  . Fall     (Consider location/radiation/quality/duration/timing/severity/associated sxs/prior Treatment) Patient is a 82 y.o. female presenting with fall.  Fall Pertinent negatives include no abdominal pain, arthralgias, chest pain, chills, congestion, coughing, fever, headaches, nausea, neck pain, numbness, rash, vomiting or weakness.   82 y.o. female with a history of atrial fib, on Eliquis, chronic back pain and left hip pain due to arthritis, presents to the emergency department by EMS after a fall shortly prior to arrival. She states that she was ambulating through her cement floor basement going to check on her dog and states "I may have been going a bit too fast" stumbled and then fell forward striking her forehead on the concrete below. He denied any preceding, chest pain and chest tightness and palpitations She states she had brief LOC but was quickly able to regain consciousness and crawl back over to the wall where she was able to call for help. On arrival EMS found her to be GCS 15 neurologically intact. On arrival to the ED the patient complains of a sensation of tingling and swelling in her forehead and bridge of her nose where she struck the pavement. She denies any neck pain, back pain, hip pain, nor pain in any other extremities or abdomen. She states that she has been compliant with her prescribed medications and has not taken anything additional for pain. She does note history of multiple mechanical falls similar and character over the last several months. Patient's daughter is present at the bedside and states that she has noted that her last several months she has noted her to have a short, shuffling gait leading to intermittent instability. The patient lives at home alone, but has extensive family support which visits  regularly. The patient does not ambulate with the assistance of a cane or walker at this time.  Past Medical History  Diagnosis Date  . MVP (mitral valve prolapse)   . Atrial fibrillation ([place])   . Hyperlipidemia   . Hypothyroid   . GERD (gastroesophageal reflux disease)   . Gastroparesis   . Hypertension   . Chronic back pain   . Adenomatous rectal polyp   . Bleeding internal hemorrhoids   . Lichen sclerosus et atrophicus of the vulva   . Diverticulosis of colon (without mention of hemorrhage)   . Hiatal hernia 09/1999    3cm  . TIA (transient ischemic attack)   . Ischemic colitis [facility])    Past Surgical History  Procedure Laterality Date  . Cataract extraction    . Upper gastrointestinal endoscopy  08/23/2011    Normal  . Sigmoidoscopy  08/23/2011  . Tubal ligation    . Cholecystectomy    . Left heart catheterization with coronary angiogram N/A 07/17/2013    Procedure: LEFT HEART CATHETERIZATION WITH CORONARY ANGIOGRAM;  Surgeon: Clent Demark, MD;  Location: [facility] CATH LAB;  Service: Cardiovascular;  Laterality: N/A;  . Colonoscopy      Multiple, polyps  . Cardiac catheterization N/A 02/03/2016    Procedure: Coronary/Graft Angiography;  Surgeon: Charolette Forward, MD;  Location: [facility] CV LAB;  Service: Cardiovascular;  Laterality: N/A;   Family History  Problem Relation Age of Onset  . Stomach cancer Brother   . Colon cancer Paternal Aunt   . Lung cancer Brother  smoker  . Esophageal cancer Father    Social History  Substance Use Topics  . Smoking status: Never Smoker   . Smokeless tobacco: Never Used  . Alcohol Use: No   OB History    No data available     Review of Systems  Constitutional: Positive for activity change. Negative for fever and chills.  HENT: Positive for facial swelling. Negative for congestion and rhinorrhea.   Eyes: Negative for visual disturbance.  Respiratory: Negative for cough and shortness of breath.   Cardiovascular: Negative for  chest pain.  Gastrointestinal: Negative for nausea, vomiting and abdominal pain.  Genitourinary: Negative for dysuria and difficulty urinating.  Musculoskeletal: Positive for back pain (chronic) and gait problem (shuffling gait with short steps). Negative for arthralgias, neck pain and neck stiffness.  Skin: Positive for wound. Negative for rash.  Neurological: Negative for dizziness, seizures, syncope, weakness, numbness and headaches.  Psychiatric/Behavioral: Negative for confusion.  All other systems reviewed and are negative.     Allergies  Contrast media; Iodine; Iohexol; and Metoclopramide  Home Medications   Prior to Admission medications   Medication Sig Start Date End Date Taking? Authorizing Provider  ALPRAZolam Duanne Moron) 0.5 MG tablet Take 0.25 mg by mouth at bedtime as needed for anxiety.  09/11/15  Yes Historical Provider, MD  amiodarone (PACERONE) 200 MG tablet Take 1 tablet (200 mg total) by mouth daily. 03/24/16  Yes Mary-Margaret Hassell Done, [name]  amLODipine (NORVASC) 5 MG tablet TAKE 1 TABLET (5 MG TOTAL) BY MOUTH DAILY. 03/24/16  Yes Mary-Margaret Hassell Done, [name]  apixaban (ELIQUIS) 2.5 MG TABS tablet Take 1 tablet (2.5 mg total) by mouth 2 (two) times daily. 03/24/16  Yes Mary-Margaret Hassell Done, [name]  bethanechol (URECHOLINE) 10 MG tablet Take 2 tablets (20 mg total) by mouth at bedtime as needed. Patient taking differently: Take 20 mg by mouth at bedtime as needed (gastric problems).  01/01/16  Yes Gatha Mayer, MD  Cholecalciferol (VITAMIN D PO) Take 1 tablet by mouth daily.   Yes Historical Provider, MD  erythromycin base (E-MYCIN) 500 MG tablet TAKE (1) TABLET THREE TIMES DAILY BEFORE MEALS FOR GASTRIC MOTILITY 04/12/16  Yes Mary-Margaret Hassell Done, [name]  escitalopram (LEXAPRO) 20 MG tablet Take 2 tablets (40 mg total) by mouth daily. 03/24/16  Yes Mary-Margaret Hassell Done, [name]  EVENING PRIMROSE OIL PO Take 1 capsule by mouth daily.   Yes Historical Provider, MD  furosemide (LASIX) 20 MG  tablet Take 1 tablet (20 mg total) by mouth daily as needed for fluid or edema. 03/24/16  Yes Mary-Margaret Hassell Done, [name]  levothyroxine (SYNTHROID, LEVOTHROID) 88 MCG tablet Take 1 tablet (88 mcg total) by mouth daily before breakfast. 03/24/16  Yes Mary-Margaret Hassell Done, [name]  Melatonin 5 MG CAPS Take 5 mg by mouth at bedtime as needed (sleep).   Yes Historical Provider, MD  Multiple Vitamin (MULTIVITAMIN WITH MINERALS) TABS tablet Take 1 tablet by mouth daily.   Yes Historical Provider, MD  Omega-3 Fatty Acids (FISH OIL PO) Take 1 tablet by mouth daily.   Yes Historical Provider, MD  omeprazole-sodium bicarbonate (ZEGERID) 40-1100 MG capsule TAKE 1 CAPSULE BY MOUTH TWICE A DAY BEFORE BREAKFAST AND AT BEDTIME 03/24/16  Yes Mary-Margaret Hassell Done, [name]   BP 144/62 mmHg  Pulse 67  Temp(Src) 98.2 F (36.8 C) (Oral)  Resp 9  SpO2 100% Physical Exam  Constitutional: She is oriented to person, place, and time. She appears well-developed and well-nourished. No distress.  HENT:  Head: Normocephalic. Head is with abrasion and with contusion.  Head is without raccoon's eyes, without Battle's sign and without laceration.    Right Ear: External ear normal.  Left Ear: External ear normal.  Nose: Nose normal.  Mouth/Throat: Oropharynx is clear and moist.  No midface instability, no malocclusion, no hemoseptum.   Eyes: Conjunctivae and EOM are normal. Pupils are equal, round, and reactive to light.  Neck: Normal range of motion. Neck supple.  Cardiovascular: Normal rate, regular rhythm, normal heart sounds and intact distal pulses.   Pulmonary/Chest: Effort normal and breath sounds normal. She exhibits no tenderness.  Abdominal: Soft. There is no tenderness.  Musculoskeletal: Normal range of motion. She exhibits no edema or tenderness.  No C/T/L spine tenderness. No pelvic pain, crepitus, or instability.   Neurological: She is alert and oriented to person, place, and time. No cranial nerve deficit.  Coordination normal.  Skin: Skin is warm and dry. She is not diaphoretic.  Nursing note and vitals reviewed.   ED Course  Procedures (including critical care time) Labs Review Labs Reviewed  BASIC METABOLIC PANEL - Abnormal; Notable for the following:    Potassium 5.2 (*)    Glucose, Bld 107 (*)    BUN 24 (*)    Creatinine, Ser 1.15 (*)    GFR calc non Af Amer 43 (*)    GFR calc Af Amer 50 (*)    All other components within normal limits  PROTIME-INR - Abnormal; Notable for the following:    Prothrombin Time 15.5 (*)    All other components within normal limits  CBC WITH DIFFERENTIAL/PLATELET    Imaging Review Ct Head Wo Contrast  04/21/2016  CLINICAL DATA:  Pt reports she fell face forward and hit face on concrete. Small laceration on nose. C/O HA, little blurry vision. H/O. Small laceration on nose. C/O HA, little blurry vision EXAM: CT HEAD WITHOUT CONTRAST CT MAXILLOFACIAL WITHOUT CONTRAST TECHNIQUE: Multidetector CT imaging of the head and maxillofacial structures were performed using the standard protocol without intravenous contrast. Multiplanar CT image reconstructions of the maxillofacial structures were also generated. COMPARISON:  Head CT 07/08/2011 FINDINGS: CT HEAD FINDINGS No intracranial hemorrhage. No parenchymal contusion. No midline shift or mass effect. Basilar cisterns are patent. No skull base fracture. No fluid in the paranasal sinuses or mastoid air cells. Orbits are normal. Mild generalized cortical atrophy. Mild periventricular and subcortical white matter hypodensities. CT MAXILLOFACIAL FINDINGS No orbital wall fracture. Globes are normal. No proptosis. Intra Conal contents are clear. No zygomatic arch fracture. Subtle minimally depressed fracture of the LEFT nasal bone (image 60, series 3). The maxillary sinus walls are intact. No fluid in the maxillary sinuses. Pterygoid plates are normal. Mandibular condyles are located.  No mandibular fracture. IMPRESSION: 1. No  intracranial trauma. 2. Mild atrophy and white matter microvascular disease. 3. Minimally depressed nasal bone fracture LEFT. Electronically Signed   By: Suzy Bouchard M.D.   On: 04/21/2016 16:28   Ct Maxillofacial Wo Cm  04/21/2016  CLINICAL DATA:  Pt reports she fell face forward and hit face on concrete. Small laceration on nose. C/O HA, little blurry vision. H/O. Small laceration on nose. C/O HA, little blurry vision EXAM: CT HEAD WITHOUT CONTRAST CT MAXILLOFACIAL WITHOUT CONTRAST TECHNIQUE: Multidetector CT imaging of the head and maxillofacial structures were performed using the standard protocol without intravenous contrast. Multiplanar CT image reconstructions of the maxillofacial structures were also generated. COMPARISON:  Head CT 07/08/2011 FINDINGS: CT HEAD FINDINGS No intracranial hemorrhage. No parenchymal contusion. No midline shift or mass effect. Basilar  cisterns are patent. No skull base fracture. No fluid in the paranasal sinuses or mastoid air cells. Orbits are normal. Mild generalized cortical atrophy. Mild periventricular and subcortical white matter hypodensities. CT MAXILLOFACIAL FINDINGS No orbital wall fracture. Globes are normal. No proptosis. Intra Conal contents are clear. No zygomatic arch fracture. Subtle minimally depressed fracture of the LEFT nasal bone (image 60, series 3). The maxillary sinus walls are intact. No fluid in the maxillary sinuses. Pterygoid plates are normal. Mandibular condyles are located.  No mandibular fracture. IMPRESSION: 1. No intracranial trauma. 2. Mild atrophy and white matter microvascular disease. 3. Minimally depressed nasal bone fracture LEFT. Electronically Signed   By: Suzy Bouchard M.D.   On: 04/21/2016 16:28   I have personally reviewed and evaluated these images and lab results as part of my medical decision-making.   EKG Interpretation   Date/Time:  Wednesday April 21 2016 16:27:18 EDT Ventricular Rate:  66 PR Interval:  239 QRS  Duration: 143 QT Interval:  496 QTC Calculation: 520 R Axis:   81 Text Interpretation:  Sinus rhythm Prolonged PR interval Nonspecific  intraventricular conduction delay Borderline repolarization abnormality  Baseline wander in lead(s) I II aVR V6 Confirmed by Jeneen Rinks  MD, [place]  [phone]) on 04/21/2016 4:41:13 PM      MDM  82 y.o. female with a history of multiple months of intermittent mechanical falls presents to the ED after she had another mechanical fall just PTA on a concrete floor in her basement after she was "walking too fast." +LOC that was brief, no amnesia. On exam with EMS she was neurologically intact and on arrival here she is the same. Facial contusion and small linear abrasion over the bridge of her nose. CT head and face were done and showed no acute intracranial abnormalities, minimally displaced nasal fracture, and facial contusion noted. Given no neck pain on exam, FROM, nor distracting injury do not feel that a CT neck is necessary by NEXUS criteria. Labs returned showing mild hyperkalemia of 5.2, Cr 1.15, but otherwise reassuring. EKG shows no peaked t-waves. Reassurance was given and the patient was seen to be able to ambulate in the ED without significant difficulty. Her slightly unstable shuffling like gait was observed and she was recommended to start using a walker to prevent her from further falls. This was rx'd for her, and was discussed with the patient and her children at the bedside and they stated both understanding and agreement with this plan. They said that they would have no difficulty getting one for her and would happily help her get home this evening.   Final diagnoses:  Fall, initial encounter  Nasal fracture, closed, initial encounter  Facial contusion, initial encounter      Zenovia Jarred, DO 04/22/16 1126  Tanna Furry, MD 05/01/16 2109

## 2016-04-21 NOTE — ED Notes (Signed)
Pt placed on monitor upon return to room. Pt monitored by blood pressure, pulse ox, and 12 lead. Pt is noted to have family at bedside.

## 2016-04-21 NOTE — ED Provider Notes (Signed)
Pt seen and evaluated.  D/W Dr. Bettey Costa.  Pt reports fall in her basement face first onto concrete.  No syncope.  Sinus rhythm on exam.  H/o  AF anticoagulated.  Nasal and forehead sts.  Neuro intact. Agree with CT face and head.  No CT neck per nexus.  Tanna Furry, MD 04/21/16 [phone]

## 2016-04-21 NOTE — ED Notes (Signed)
Per EMS- pt fell face forward and landed on cement. Pt noted to have small laceration to bridge of nose. Pt denies dizziness/lightheadedness prior to fall. Pt is on blood thinners and states that she has numbness to her forrehead. No neuro deficits noted. Pt alert and oriented.

## 2016-05-04 DIAGNOSIS — M549 Dorsalgia, unspecified: Secondary | ICD-10-CM | POA: Diagnosis not present

## 2016-05-04 DIAGNOSIS — Z6827 Body mass index (BMI) 27.0-27.9, adult: Secondary | ICD-10-CM | POA: Diagnosis not present

## 2016-05-04 DIAGNOSIS — M47816 Spondylosis without myelopathy or radiculopathy, lumbar region: Secondary | ICD-10-CM | POA: Diagnosis not present

## 2016-05-04 DIAGNOSIS — M546 Pain in thoracic spine: Secondary | ICD-10-CM | POA: Diagnosis not present

## 2016-05-04 DIAGNOSIS — M5136 Other intervertebral disc degeneration, lumbar region: Secondary | ICD-10-CM | POA: Diagnosis not present

## 2016-05-04 DIAGNOSIS — M4806 Spinal stenosis, lumbar region: Secondary | ICD-10-CM | POA: Diagnosis not present

## 2016-05-04 DIAGNOSIS — M5416 Radiculopathy, lumbar region: Secondary | ICD-10-CM | POA: Diagnosis not present

## 2016-05-10 ENCOUNTER — Other Ambulatory Visit: Payer: Self-pay | Admitting: Nurse Practitioner

## 2016-05-10 ENCOUNTER — Other Ambulatory Visit: Payer: Self-pay | Admitting: Internal Medicine

## 2016-05-10 NOTE — Telephone Encounter (Signed)
Last seen 03/24/16  MMM

## 2016-05-12 DIAGNOSIS — E785 Hyperlipidemia, unspecified: Secondary | ICD-10-CM | POA: Diagnosis not present

## 2016-05-12 DIAGNOSIS — E039 Hypothyroidism, unspecified: Secondary | ICD-10-CM | POA: Diagnosis not present

## 2016-05-12 DIAGNOSIS — I2510 Atherosclerotic heart disease of native coronary artery without angina pectoris: Secondary | ICD-10-CM | POA: Diagnosis not present

## 2016-05-12 DIAGNOSIS — I10 Essential (primary) hypertension: Secondary | ICD-10-CM | POA: Diagnosis not present

## 2016-05-12 DIAGNOSIS — I480 Paroxysmal atrial fibrillation: Secondary | ICD-10-CM | POA: Diagnosis not present

## 2016-05-12 DIAGNOSIS — M1990 Unspecified osteoarthritis, unspecified site: Secondary | ICD-10-CM | POA: Diagnosis not present

## 2016-05-17 ENCOUNTER — Other Ambulatory Visit: Payer: Self-pay | Admitting: Obstetrics and Gynecology

## 2016-05-17 DIAGNOSIS — R921 Mammographic calcification found on diagnostic imaging of breast: Secondary | ICD-10-CM

## 2016-05-20 DIAGNOSIS — M4726 Other spondylosis with radiculopathy, lumbar region: Secondary | ICD-10-CM | POA: Diagnosis not present

## 2016-05-20 DIAGNOSIS — M4806 Spinal stenosis, lumbar region: Secondary | ICD-10-CM | POA: Diagnosis not present

## 2016-05-20 DIAGNOSIS — M5136 Other intervertebral disc degeneration, lumbar region: Secondary | ICD-10-CM | POA: Diagnosis not present

## 2016-05-26 ENCOUNTER — Ambulatory Visit (INDEPENDENT_AMBULATORY_CARE_PROVIDER_SITE_OTHER): Payer: Medicare Other | Admitting: Internal Medicine

## 2016-05-26 ENCOUNTER — Encounter: Payer: Self-pay | Admitting: Internal Medicine

## 2016-05-26 ENCOUNTER — Ambulatory Visit: Payer: Medicare Other | Admitting: Internal Medicine

## 2016-05-26 VITALS — BP 100/68 | HR 68 | Ht 61.0 in | Wt 142.0 lb

## 2016-05-26 DIAGNOSIS — I2510 Atherosclerotic heart disease of native coronary artery without angina pectoris: Secondary | ICD-10-CM

## 2016-05-26 DIAGNOSIS — Z7901 Long term (current) use of anticoagulants: Secondary | ICD-10-CM

## 2016-05-26 DIAGNOSIS — K3184 Gastroparesis: Secondary | ICD-10-CM

## 2016-05-26 DIAGNOSIS — K224 Dyskinesia of esophagus: Secondary | ICD-10-CM | POA: Diagnosis not present

## 2016-05-26 DIAGNOSIS — Z9181 History of falling: Secondary | ICD-10-CM

## 2016-05-26 NOTE — Patient Instructions (Addendum)
   Per Dr Carlean Purl stop your E-mycin and don't take your Camilla for now.    We are giving you a gastroparesis  diet handout, use level 3 per Dr Carlean Purl.  Please call us next week with an update, ask for Barb Merino, RN, [facility]     I appreciate the opportunity to care for you. Silvano Rusk, MD, [facility]

## 2016-05-26 NOTE — Progress Notes (Signed)
   Subjective:    Patient ID: Erica [name], female    DOB: [birth year], 82 y.o.   MRN: [id number] Cc: my stomach isn't right HPI Having problems with intermittent dysphagia, also regurgitation at times. Not sleeping well still. Wt Readings from Last 3 Encounters:  05/26/16 142 lb (64.411 kg)  03/24/16 150 lb 3.2 oz (68.13 kg)  02/04/16 148 lb 8 oz (67.359 kg)    Uses erythromycin rarely if at all. Still taking bethanechol at bedtime.  C/o anorexia also  Hospitalized in March with chest pain Started Eliquis for atrial fibrillation   Review of Systems Remains upset over son's death. Has fallen a few times and injured head/fx nose    Objective:   Physical Exam @BP  100/68 mmHg  Pulse 68  Ht 5\' 1"  (1.549 m)  Wt 142 lb (64.411 kg)  BMI 26.84 kg/m2@  General:  NAD Eyes:   anicteric Lungs:  clear Heart::  S1S2 no rubs, murmurs or gallops Abdomen:  soft and nontender, BS+ Ext:   no edema, cyanosis or clubbing  Data Reviewed:   Wt Readings from Last 3 Encounters:  05/26/16 142 lb (64.411 kg)  03/24/16 150 lb 3.2 oz (68.13 kg)  02/04/16 148 lb 8 oz (67.359 kg)      Assessment & Plan:   Encounter Diagnoses  Name Primary?  . Gastroparesis Yes  . Esophageal dysmotility   . History of recent fall   . Chronic anticoagulation    Stop erythromycin completely Hold bethanechol - could make esophageal dysmotiliy worse Call back to me next week re: sxs better same worse ? Manometry  - would not take eliquis AM of test ?? Needs Eliquis long-term - recent falls  I appreciate the opportunity to care for this patient.  [id number] MARGARET, [name] Dr. Terrence Dupont

## 2016-05-27 ENCOUNTER — Encounter: Payer: Self-pay | Admitting: Internal Medicine

## 2016-05-31 ENCOUNTER — Telehealth: Payer: Self-pay | Admitting: Internal Medicine

## 2016-05-31 DIAGNOSIS — K3184 Gastroparesis: Secondary | ICD-10-CM

## 2016-05-31 DIAGNOSIS — K224 Dyskinesia of esophagus: Secondary | ICD-10-CM

## 2016-05-31 NOTE — Telephone Encounter (Signed)
Patient reports no improvement off bethanechol and gastroparesis diet. Dr. Carlean Purl please advise

## 2016-05-31 NOTE — Telephone Encounter (Signed)
She needs to do an esophageal manometry She should not take eliquis the day of test until she completes the test

## 2016-05-31 NOTE — Telephone Encounter (Signed)
Patient advised and agrees to the plan. Esophageal manometry at [facility] on 06/14/16. Fast for 4 hours prior. No Eliquis  The morning of the procedure.

## 2016-06-09 ENCOUNTER — Other Ambulatory Visit: Payer: Self-pay | Admitting: Nurse Practitioner

## 2016-06-14 ENCOUNTER — Ambulatory Visit (HOSPITAL_COMMUNITY)
Admission: RE | Admit: 2016-06-14 | Discharge: 2016-06-14 | Disposition: A | Discharge status: Home / Self Care | Payer: Medicare Other | Source: Ambulatory Visit | Attending: Internal Medicine | Admitting: Internal Medicine

## 2016-06-14 ENCOUNTER — Encounter (HOSPITAL_COMMUNITY)
Admission: RE | Disposition: A | Discharge status: Home / Self Care | Payer: Self-pay | Source: Ambulatory Visit | Attending: Internal Medicine

## 2016-06-14 DIAGNOSIS — R079 Chest pain, unspecified: Secondary | ICD-10-CM | POA: Diagnosis not present

## 2016-06-14 DIAGNOSIS — K449 Diaphragmatic hernia without obstruction or gangrene: Secondary | ICD-10-CM | POA: Diagnosis not present

## 2016-06-14 DIAGNOSIS — R1310 Dysphagia, unspecified: Secondary | ICD-10-CM | POA: Diagnosis not present

## 2016-06-14 HISTORY — PX: ESOPHAGEAL MANOMETRY: SHX5429

## 2016-06-14 SURGERY — MANOMETRY, ESOPHAGUS
Anesthesia: Choice

## 2016-06-14 MED ORDER — LIDOCAINE VISCOUS 2 % MT SOLN
OROMUCOSAL | Status: AC
  Filled 2016-06-14: qty 15

## 2016-06-14 SURGICAL SUPPLY — 2 items
FACESHIELD LNG OPTICON STERILE (SAFETY) IMPLANT
GLOVE BIO SURGEON STRL SZ8 (GLOVE) ×4 IMPLANT

## 2016-06-14 NOTE — Progress Notes (Signed)
Esophageal Manometry done per protocol. Pt tolerated well without complications. Pt held her Eliquis for this procedure. Pt understands to re-start taking Eliquis now that test is over. No blood seen on probe or from nose at completion. Report to be sent to Dr. Celesta Aver office.

## 2016-06-15 ENCOUNTER — Encounter (HOSPITAL_COMMUNITY): Payer: Self-pay | Admitting: Internal Medicine

## 2016-06-21 ENCOUNTER — Telehealth: Payer: Self-pay | Admitting: Internal Medicine

## 2016-06-22 NOTE — Telephone Encounter (Signed)
Pt advised that the results are not available at this time and we will call her as soon as resulted.

## 2016-06-25 ENCOUNTER — Encounter (HOSPITAL_COMMUNITY): Payer: Self-pay | Admitting: Gastroenterology

## 2016-06-25 DIAGNOSIS — R1310 Dysphagia, unspecified: Secondary | ICD-10-CM | POA: Insufficient documentation

## 2016-06-30 ENCOUNTER — Ambulatory Visit
Admission: RE | Admit: 2016-06-30 | Discharge: 2016-06-30 | Disposition: A | Discharge status: Home / Self Care | Payer: Medicare Other | Source: Ambulatory Visit | Attending: Obstetrics and Gynecology | Admitting: Obstetrics and Gynecology

## 2016-06-30 DIAGNOSIS — R921 Mammographic calcification found on diagnostic imaging of breast: Secondary | ICD-10-CM

## 2016-07-01 ENCOUNTER — Encounter (HOSPITAL_COMMUNITY): Payer: Self-pay | Admitting: Nurse Practitioner

## 2016-07-01 ENCOUNTER — Emergency Department (HOSPITAL_COMMUNITY)
Admission: EM | Admit: 2016-07-01 | Discharge: 2016-07-01 | Disposition: A | Discharge status: Home / Self Care | Payer: Medicare Other | Attending: Emergency Medicine | Admitting: Emergency Medicine

## 2016-07-01 ENCOUNTER — Ambulatory Visit (INDEPENDENT_AMBULATORY_CARE_PROVIDER_SITE_OTHER): Payer: Medicare Other | Admitting: Family Medicine

## 2016-07-01 ENCOUNTER — Emergency Department (HOSPITAL_COMMUNITY): Payer: Medicare Other

## 2016-07-01 ENCOUNTER — Encounter: Payer: Self-pay | Admitting: Family Medicine

## 2016-07-01 VITALS — BP 154/66 | HR 60 | Temp 97.6°F | Ht 61.0 in | Wt 146.0 lb

## 2016-07-01 DIAGNOSIS — Y939 Activity, unspecified: Secondary | ICD-10-CM | POA: Insufficient documentation

## 2016-07-01 DIAGNOSIS — Y999 Unspecified external cause status: Secondary | ICD-10-CM | POA: Diagnosis not present

## 2016-07-01 DIAGNOSIS — Z8673 Personal history of transient ischemic attack (TIA), and cerebral infarction without residual deficits: Secondary | ICD-10-CM | POA: Diagnosis not present

## 2016-07-01 DIAGNOSIS — R42 Dizziness and giddiness: Secondary | ICD-10-CM | POA: Diagnosis not present

## 2016-07-01 DIAGNOSIS — Z7901 Long term (current) use of anticoagulants: Secondary | ICD-10-CM | POA: Diagnosis not present

## 2016-07-01 DIAGNOSIS — W19XXXA Unspecified fall, initial encounter: Secondary | ICD-10-CM | POA: Diagnosis not present

## 2016-07-01 DIAGNOSIS — Z79899 Other long term (current) drug therapy: Secondary | ICD-10-CM | POA: Insufficient documentation

## 2016-07-01 DIAGNOSIS — I2510 Atherosclerotic heart disease of native coronary artery without angina pectoris: Secondary | ICD-10-CM | POA: Diagnosis not present

## 2016-07-01 DIAGNOSIS — R4781 Slurred speech: Secondary | ICD-10-CM

## 2016-07-01 DIAGNOSIS — Y929 Unspecified place or not applicable: Secondary | ICD-10-CM | POA: Diagnosis not present

## 2016-07-01 DIAGNOSIS — R296 Repeated falls: Secondary | ICD-10-CM | POA: Diagnosis not present

## 2016-07-01 DIAGNOSIS — I10 Essential (primary) hypertension: Secondary | ICD-10-CM | POA: Diagnosis not present

## 2016-07-01 LAB — I-STAT CHEM 8, ED
BUN: 14 mg/dL (ref 6–20)
CALCIUM ION: 1.18 mmol/L (ref 1.12–1.23)
CHLORIDE: 102 mmol/L (ref 101–111)
CREATININE: 0.9 mg/dL (ref 0.44–1.00)
GLUCOSE: 106 mg/dL — AB (ref 65–99)
HCT: 40 % (ref 36.0–46.0)
Hemoglobin: 13.6 g/dL (ref 12.0–15.0)
POTASSIUM: 3.8 mmol/L (ref 3.5–5.1)
Sodium: 135 mmol/L (ref 135–145)
TCO2: 22 mmol/L (ref 0–100)

## 2016-07-01 LAB — CBC
HEMATOCRIT: 39.3 % (ref 36.0–46.0)
Hemoglobin: 13.4 g/dL (ref 12.0–15.0)
MCH: 31.8 pg (ref 26.0–34.0)
MCHC: 34.1 g/dL (ref 30.0–36.0)
MCV: 93.3 fL (ref 78.0–100.0)
PLATELETS: 198 10*3/uL (ref 150–400)
RBC: 4.21 MIL/uL (ref 3.87–5.11)
RDW: 14.5 % (ref 11.5–15.5)
WBC: 7.7 10*3/uL (ref 4.0–10.5)

## 2016-07-01 LAB — COMPREHENSIVE METABOLIC PANEL
ALK PHOS: 60 U/L (ref 38–126)
ALT: 69 U/L — AB (ref 14–54)
AST: 49 U/L — AB (ref 15–41)
Albumin: 3.3 g/dL — ABNORMAL LOW (ref 3.5–5.0)
Anion gap: 8 (ref 5–15)
BUN: 12 mg/dL (ref 6–20)
CALCIUM: 9.4 mg/dL (ref 8.9–10.3)
CHLORIDE: 102 mmol/L (ref 101–111)
CO2: 23 mmol/L (ref 22–32)
CREATININE: 0.94 mg/dL (ref 0.44–1.00)
GFR calc non Af Amer: 55 mL/min — ABNORMAL LOW (ref >60)
Glucose, Bld: 107 mg/dL — ABNORMAL HIGH (ref 65–99)
Potassium: 3.8 mmol/L (ref 3.5–5.1)
SODIUM: 133 mmol/L — AB (ref 135–145)
Total Bilirubin: 0.5 mg/dL (ref 0.3–1.2)
Total Protein: 6.2 g/dL — ABNORMAL LOW (ref 6.5–8.1)

## 2016-07-01 LAB — DIFFERENTIAL
BASOS ABS: 0 10*3/uL (ref 0.0–0.1)
BASOS PCT: 0 %
Eosinophils Absolute: 0 10*3/uL (ref 0.0–0.7)
Eosinophils Relative: 1 %
LYMPHS PCT: 37 %
Lymphs Abs: 2.8 10*3/uL (ref 0.7–4.0)
MONO ABS: 0.8 10*3/uL (ref 0.1–1.0)
MONOS PCT: 10 %
NEUTROS ABS: 4 10*3/uL (ref 1.7–7.7)
Neutrophils Relative %: 52 %

## 2016-07-01 LAB — I-STAT TROPONIN, ED: Troponin i, poc: 0 ng/mL (ref 0.00–0.08)

## 2016-07-01 LAB — PROTIME-INR
INR: 1.04 (ref 0.00–1.49)
Prothrombin Time: 13.8 seconds (ref 11.6–15.2)

## 2016-07-01 LAB — APTT: APTT: 28 s (ref 24–37)

## 2016-07-01 NOTE — ED Notes (Addendum)
She reports 3 week history of increased falls, malaise. She was seen here for one of the falls. States she falls because she gets dizzy. She c/o intermittent head pain and back pain over past weeks. Her PCP sent her from the office today ([place]) for further evaluation of dizziness and to r/o possible stroke. She denies falls today but has been dizzy. She is alert, ambulatory.

## 2016-07-01 NOTE — Patient Instructions (Signed)
I would like you to go to the emergency room for evaluation today.

## 2016-07-01 NOTE — ED Notes (Signed)
Taken to MRI at this time

## 2016-07-01 NOTE — Discharge Instructions (Signed)
Return to the ED with any concerns including weakness of arms or legs, changes in vision or speech, severe headache, fainting, chest pain, difficulty breathing, decreased level of alertness/lethargy, or any other alarming symptoms

## 2016-07-01 NOTE — Progress Notes (Signed)
   HPI  Patient presents today here with slurred speech, change in balance, and recent head trauma while anticoagulated.  Patient explains that 2 days ago she fell and hit her head on the occipital area. She was taking helical list at that time and skipped a day because of the head trauma.  He states that over the last 3 weeks she's had slurred speech off and on, this is been perceived by her family that she has difficulty making words and feels like her certainly, out.  She describes change in balance.  She states that she has not had any severe persistent headache since the fall, however she has had intermittent bitemporal and frontal headache.  He has stable left lower extremity numbness from back pain and issues, she sees Dr.Nudleman for neurosurgery.  PMH: Smoking status noted ROS: Per HPI  Objective: BP 154/66 mmHg  Pulse 60  Temp(Src) 97.6 F (36.4 C) (Oral)  Ht 5\' 1"  (1.549 m)  Wt 146 lb (66.225 kg)  BMI 27.60 kg/m2 Gen: NAD, alert, cooperative with exam HEENT: NCAT, EOMI, PERRL  CV: RRR, good S1/S2, no murmur Resp: CTABL, no wheezes, non-labored Ext: No edema, warm Neuro: Alert and oriented, right-sided facial droop with smile, also right-sided nasolabial fold flattening, strength 5/5 and sensation intact in all 4 extremities, sensation normal, cranial nerves II-12 intact except for right-sided facial droop as above.  Assessment and plan:  # Right-sided facial droop, slurred speech, nasolabial fold flattening Slurred speech is not appreciated by myself today, however she does have a new onset right-sided facial droop. The onset is unknown, greater than 4 hours. She appears stable and is hemodynamically stable, I have recommended she go to the emergency room for consideration of MRI and neurology consult.  She had head trauma 2 nights ago and stopped her Eliquis 1 day, she took 1 additional pill today.  With frequent falls recently have recommended she stop Eliquis,  she appears to be higher fall risk than she is stroke risk with A. Fib. She would need to continue daily aspirin.    Laroy Apple, MD [facility] 07/01/2016, 3:56 PM

## 2016-07-01 NOTE — ED Provider Notes (Signed)
CSN: [id number]     Arrival date & time 07/01/16  1653 History   First MD Initiated Contact with Patient 07/01/16 1839     Chief Complaint  Patient presents with  . Fall     (Consider location/radiation/quality/duration/timing/severity/associated sxs/prior Treatment) HPI  Pt presenting with c/o multiple falls recently as well as new right sided facial droop which was noted by her PMD this morning- pt is unaware of this and it is unknown when it started. She also c/o feeling lightheaded causing her to fall frequently.  She states she falls backward.  Her PMD advised her to stop taking her eliquis as risk of fall is higher than risk of stroke from afib in her case.  No focal weakness. No changes in vision.  She denies vertigo or sensation of movement.  She has had some intermittent episodes of slurred speech- no slurring of speech today- but states that she is aware at times that her speech sounds garbled and then will resolve on its own.  No difficulty with word finding or producing speech.  Symptoms have been ongoing for the past several weeks.  There are no other associated systemic symptoms, there are no other alleviating or modifying factors.   Past Medical History  Diagnosis Date  . MVP (mitral valve prolapse)   . Atrial fibrillation ([place])   . Hyperlipidemia   . Hypothyroid   . GERD (gastroesophageal reflux disease)   . Gastroparesis   . Hypertension   . Chronic back pain   . Adenomatous rectal polyp   . Bleeding internal hemorrhoids   . Lichen sclerosus et atrophicus of the vulva   . Diverticulosis of colon (without mention of hemorrhage)   . Hiatal hernia 09/1999    3cm  . TIA (transient ischemic attack)   . Ischemic colitis [facility])    Past Surgical History  Procedure Laterality Date  . Cataract extraction    . Upper gastrointestinal endoscopy  08/23/2011    Normal  . Sigmoidoscopy  08/23/2011  . Tubal ligation    . Cholecystectomy    . Left heart catheterization with  coronary angiogram N/A 07/17/2013    Procedure: LEFT HEART CATHETERIZATION WITH CORONARY ANGIOGRAM;  Surgeon: Clent Demark, MD;  Location: [facility] CATH LAB;  Service: Cardiovascular;  Laterality: N/A;  . Colonoscopy      Multiple, polyps  . Cardiac catheterization N/A 02/03/2016    Procedure: Coronary/Graft Angiography;  Surgeon: Charolette Forward, MD;  Location: [facility] CV LAB;  Service: Cardiovascular;  Laterality: N/A;  . Esophageal manometry N/A 06/14/2016    Procedure: ESOPHAGEAL MANOMETRY (EM);  Surgeon: Gatha Mayer, MD;  Location: [facility] ENDOSCOPY;  Service: Endoscopy;  Laterality: N/A;   Family History  Problem Relation Age of Onset  . Stomach cancer Brother   . Colon cancer Paternal Aunt   . Lung cancer Brother     smoker  . Esophageal cancer Father    Social History  Substance Use Topics  . Smoking status: Never Smoker   . Smokeless tobacco: Never Used  . Alcohol Use: No   OB History    No data available     Review of Systems  ROS reviewed and all otherwise negative except for mentioned in HPI    Allergies  Contrast media; Iodine; Iohexol; Ioxaglate; and Metoclopramide  Home Medications   Prior to Admission medications   Medication Sig Start Date End Date Taking? Authorizing Provider  ALPRAZolam Duanne Moron) 0.5 MG tablet Take 0.25 mg by mouth at bedtime  as needed for anxiety.  09/11/15  Yes Historical Provider, MD  amiodarone (PACERONE) 200 MG tablet Take 1 tablet (200 mg total) by mouth daily. 03/24/16  Yes Mary-Margaret Hassell Done, [name]  amLODipine (NORVASC) 5 MG tablet TAKE 1 TABLET (5 MG TOTAL) BY MOUTH DAILY. Patient taking differently: Take 2.5 mg by mouth daily. TAKE 1 TABLET (5 MG TOTAL) BY MOUTH DAILY. 03/24/16  Yes Mary-Margaret Hassell Done, [name]  apixaban (ELIQUIS) 2.5 MG TABS tablet Take 1 tablet (2.5 mg total) by mouth 2 (two) times daily. 03/24/16  Yes Mary-Margaret Hassell Done, [name]  bethanechol (URECHOLINE) 10 MG tablet TAKE 2 TABLETS AT BEDTIME AS NEEDED 05/10/16  Yes Gatha Mayer, MD  Cholecalciferol (VITAMIN D PO) Take 1 tablet by mouth daily.   Yes Historical Provider, MD  erythromycin base (E-MYCIN) 500 MG tablet TAKE (1) TABLET THREE TIMES DAILY BEFORE MEALS FOR GASTRIC MOTILITY 06/09/16  Yes Mary-Margaret Hassell Done, [name]  escitalopram (LEXAPRO) 20 MG tablet Take 2 tablets (40 mg total) by mouth daily. 03/24/16  Yes Mary-Margaret Hassell Done, [name]  EVENING PRIMROSE OIL PO Take 1 capsule by mouth daily.   Yes Historical Provider, MD  furosemide (LASIX) 20 MG tablet Take 1 tablet (20 mg total) by mouth daily as needed for fluid or edema. 03/24/16  Yes Mary-Margaret Hassell Done, [name]  levothyroxine (SYNTHROID, LEVOTHROID) 88 MCG tablet Take 1 tablet (88 mcg total) by mouth daily before breakfast. 03/24/16  Yes Mary-Margaret Hassell Done, [name]  Melatonin 5 MG CAPS Take 5 mg by mouth at bedtime as needed (sleep).   Yes Historical Provider, MD  Multiple Vitamin (MULTIVITAMIN WITH MINERALS) TABS tablet Take 1 tablet by mouth daily.   Yes Historical Provider, MD  olmesartan (BENICAR) 40 MG tablet  04/28/16  Yes Historical Provider, MD  Omega-3 Fatty Acids (FISH OIL PO) Take 1 tablet by mouth daily.   Yes Historical Provider, MD  spironolactone (ALDACTONE) 25 MG tablet 12.5 mg.  04/28/16  Yes Historical Provider, MD  omeprazole-sodium bicarbonate (ZEGERID) 40-1100 MG capsule TAKE 1 CAPSULE BY MOUTH TWICE A DAY BEFORE BREAKFAST AND AT BEDTIME 03/24/16   Mary-Margaret Hassell Done, [name]   BP 167/65 mmHg  Pulse 62  Temp(Src) 97.8 F (36.6 C) (Oral)  Resp 10  SpO2 100%  Vitals reviewed Physical Exam  Physical Examination: General appearance - alert, well appearing, and in no distress Mental status - alert, oriented to person, place, and time Eyes - pupils equal and reactive, extraocular eye movements intact Mouth - mucous membranes moist, pharynx normal without lesions Neck - supple, no significant adenopathy Chest - clear to auscultation, no wheezes, rales or rhonchi, symmetric air entry Heart - normal  rate, regular rhythm, normal S1, S2, no murmurs, rubs, clicks or gallops Abdomen - soft, nontender, nondistended, no masses or organomegaly Neurological - alert, oriented x3, cranial nerves 2-12 tested and intact, strength 5/5 in extremities x 4, sensation intact, no facial droop noticeable Extremities - peripheral pulses normal, no pedal edema, no clubbing or cyanosis Skin - normal coloration and turgor, no rashes  ED Course  Procedures (including critical care time) Labs Review Labs Reviewed  COMPREHENSIVE METABOLIC PANEL - Abnormal; Notable for the following:    Sodium 133 (*)    Glucose, Bld 107 (*)    Total Protein 6.2 (*)    Albumin 3.3 (*)    AST 49 (*)    ALT 69 (*)    GFR calc non Af Amer 55 (*)    All other components within normal limits  I-STAT CHEM 8,  ED - Abnormal; Notable for the following:    Glucose, Bld 106 (*)    All other components within normal limits  PROTIME-INR  APTT  CBC  DIFFERENTIAL  I-STAT TROPOININ, ED  CBG MONITORING, ED    Imaging Review Ct Head Wo Contrast  07/01/2016  CLINICAL DATA:  Increased falls.   Rule out stroke. EXAM: CT HEAD WITHOUT CONTRAST TECHNIQUE: Contiguous axial images were obtained from the base of the skull through the vertex without intravenous contrast. COMPARISON:  04/21/2016. FINDINGS: Brain: Mild low density in the periventricular white matter likely related to small vessel disease. Expected cerebral volume loss for age. No mass lesion, hemorrhage, hydrocephalus, acute infarct, intra-axial, or extra-axial fluid collection. Vascular: No hyperdense vessel or unexpected calcification. Skull: No significant soft tissue swelling.  No skull fracture. Sinuses/Orbits: Minimal fluid in the dependent sphenoid sinus. Other paranasal sinuses and mastoid air cells are clear. Normal orbits and globes. Other: None IMPRESSION: 1.  No acute intracranial abnormality. 2. Small vessel ischemic change. 3. Minimal fluid in the dependent sphenoid sinus,  favored to relate to mild sinusitis. Electronically Signed   By: Abigail Miyamoto M.D.   On: 07/01/2016 18:43   Mr Brain Wo Contrast  07/01/2016  CLINICAL DATA:  82 year old hypertensive female with history of increasing falls and weakness. Falls because of dizziness. Intermittent head and back pain over the past few weeks. Subsequent encounter. EXAM: MRI HEAD WITHOUT CONTRAST TECHNIQUE: Multiplanar, multiecho pulse sequences of the brain and surrounding structures were obtained without intravenous contrast. COMPARISON:  07/01/2016 head CT.  07/21/2011 brain MR. FINDINGS: No acute infarct or intracranial hemorrhage. Mild to moderate small vessel disease changes. Mild to moderate global atrophy without hydrocephalus. No intracranial mass lesion noted on this unenhanced exam. Major intracranial vascular structures are patent. Post lens replacement without acute orbital abnormality. Cervical medullary junction unremarkable. IMPRESSION: No acute infarct or intracranial hemorrhage. Mild to moderate small vessel disease changes. Mild to moderate global atrophy without hydrocephalus. Electronically Signed   By: Genia Del M.D.   On: 07/01/2016 21:52   Mm Diag Breast Tomo Uni Left  06/30/2016  CLINICAL DATA:  Patient for short-term follow-up probably benign left breast calcifications. EXAM: 2D DIGITAL DIAGNOSTIC UNILATERAL LEFT MAMMOGRAM WITH CAD AND ADJUNCT TOMO COMPARISON:  Previous exam(s). ACR Breast Density Category b: There are scattered areas of fibroglandular density. FINDINGS: Magnification CC and true lateral views demonstrate unchanged likely early dystrophic calcifications measuring 4 mm within the upper-outer left breast. No additional concerning masses, calcifications or architectural distortion identified within the left breast. Mammographic images were processed with CAD. IMPRESSION: Stable probably benign left breast calcifications. RECOMMENDATION: Bilateral diagnostic mammography with left breast  magnification views 10/2016. I have discussed the findings and recommendations with the patient. Results were also provided in writing at the conclusion of the visit. If applicable, a reminder letter will be sent to the patient regarding the next appointment. BI-RADS CATEGORY  3: Probably benign. Electronically Signed   By: Lovey Newcomer M.D.   On: 06/30/2016 14:37   I have personally reviewed and evaluated these images and lab results as part of my medical decision-making.   EKG Interpretation   Date/Time:  Thursday July 01 2016 17:10:57 EDT Ventricular Rate:  65 PR Interval:  212 QRS Duration: 140 QT Interval:  456 QTC Calculation: 474 R Axis:   44 Text Interpretation:  Sinus rhythm with 1st degree A-V block Non-specific  intra-ventricular conduction block Abnormal ECG No significant change  since last tracing Confirmed by  Canary Brim  MD, [place] [phone]) on 07/01/2016  6:57:32 PM      MDM   Final diagnoses:  Lightheadedness  Fall, initial encounter    Pt presenting with co lightheadedness intermittently, several falls.  She was seen by PMD today who felt she had a facial droop.  Normal neuro exam in the ED.  She describes feeling lightheaded, no vertigo, normal gait.  CT and MRI are negative for acute findings.  Labs reassuring.  Discharged with strict return precautions.  Pt agreeable with plan.    Alfonzo Beers, MD 07/01/16 2329

## 2016-07-02 ENCOUNTER — Telehealth: Payer: Self-pay | Admitting: Family Medicine

## 2016-07-02 NOTE — Telephone Encounter (Signed)
Pt sent to the ED for eval of stroke  Which was ruled out.   She is too high fall risk for eliquis, recommend 325 mg asa daily for stroke prevention and A fib, Stop xanax, and follow up early next week for further eval and ED follow up.   Laroy Apple, MD [facility] 07/02/2016, 10:58 AM

## 2016-07-02 NOTE — Telephone Encounter (Signed)
Patient aware and verbalizes understanding. 

## 2016-07-05 ENCOUNTER — Telehealth: Payer: Self-pay | Admitting: Nurse Practitioner

## 2016-07-06 ENCOUNTER — Ambulatory Visit: Payer: Medicare Other | Admitting: Internal Medicine

## 2016-07-09 ENCOUNTER — Other Ambulatory Visit: Payer: Self-pay | Admitting: Nurse Practitioner

## 2016-07-12 NOTE — Progress Notes (Signed)
Quick Note:  Esophagus function is normal Please see how she is doing ______

## 2016-07-14 ENCOUNTER — Encounter: Payer: Self-pay | Admitting: Nurse Practitioner

## 2016-07-14 ENCOUNTER — Ambulatory Visit (INDEPENDENT_AMBULATORY_CARE_PROVIDER_SITE_OTHER): Payer: Medicare Other | Admitting: Nurse Practitioner

## 2016-07-14 VITALS — BP 128/82 | HR 55 | Temp 97.2°F | Ht 61.0 in | Wt 145.0 lb

## 2016-07-14 DIAGNOSIS — Z6827 Body mass index (BMI) 27.0-27.9, adult: Secondary | ICD-10-CM | POA: Diagnosis not present

## 2016-07-14 DIAGNOSIS — E039 Hypothyroidism, unspecified: Secondary | ICD-10-CM | POA: Diagnosis not present

## 2016-07-14 DIAGNOSIS — R2681 Unsteadiness on feet: Secondary | ICD-10-CM | POA: Diagnosis not present

## 2016-07-14 DIAGNOSIS — F329 Major depressive disorder, single episode, unspecified: Secondary | ICD-10-CM

## 2016-07-14 DIAGNOSIS — K219 Gastro-esophageal reflux disease without esophagitis: Secondary | ICD-10-CM | POA: Diagnosis not present

## 2016-07-14 DIAGNOSIS — E785 Hyperlipidemia, unspecified: Secondary | ICD-10-CM

## 2016-07-14 DIAGNOSIS — I10 Essential (primary) hypertension: Secondary | ICD-10-CM

## 2016-07-14 DIAGNOSIS — F32A Depression, unspecified: Secondary | ICD-10-CM

## 2016-07-14 DIAGNOSIS — I2510 Atherosclerotic heart disease of native coronary artery without angina pectoris: Secondary | ICD-10-CM

## 2016-07-14 MED ORDER — OMEPRAZOLE-SODIUM BICARBONATE 40-1100 MG PO CAPS: ORAL_CAPSULE | ORAL | Status: DC

## 2016-07-14 MED ORDER — ALPRAZOLAM 0.5 MG PO TABS: 0.2500 mg | ORAL_TABLET | Freq: Every evening | ORAL | Status: DC | PRN

## 2016-07-14 MED ORDER — ESCITALOPRAM OXALATE 20 MG PO TABS: 40.0000 mg | ORAL_TABLET | Freq: Every day | ORAL | Status: DC

## 2016-07-14 MED ORDER — HUGO ROLLING WALKER ELITE MISC: Status: DC

## 2016-07-14 MED ORDER — APIXABAN 2.5 MG PO TABS: 2.5000 mg | ORAL_TABLET | Freq: Two times a day (BID) | ORAL | Status: DC

## 2016-07-14 MED ORDER — FUROSEMIDE 20 MG PO TABS: 20.0000 mg | ORAL_TABLET | Freq: Every day | ORAL | Status: DC | PRN

## 2016-07-14 MED ORDER — OLMESARTAN MEDOXOMIL 40 MG PO TABS: 40.0000 mg | ORAL_TABLET | Freq: Every day | ORAL | Status: DC

## 2016-07-14 MED ORDER — LEVOTHYROXINE SODIUM 88 MCG PO TABS: 88.0000 ug | ORAL_TABLET | Freq: Every day | ORAL | Status: DC

## 2016-07-14 MED ORDER — AMLODIPINE BESYLATE 5 MG PO TABS: 2.5000 mg | ORAL_TABLET | Freq: Every day | ORAL | Status: DC

## 2016-07-14 NOTE — Progress Notes (Signed)
Subjective:    Patient ID: Erica [name], female    DOB: 11/27/1930, 82 y.o.   MRN: [id number]   Patient here today for follow up of chronic medical problems.  Outpatient Encounter Prescriptions as of 07/14/2016  Medication Sig  . ALPRAZolam (XANAX) 0.5 MG tablet Take 0.25 mg by mouth at bedtime as needed for anxiety.   Marland Kitchen amiodarone (PACERONE) 200 MG tablet Take 1 tablet (200 mg total) by mouth daily.  Marland Kitchen amLODipine (NORVASC) 5 MG tablet TAKE 1 TABLET (5 MG TOTAL) BY MOUTH DAILY. (Patient taking differently: Take 2.5 mg by mouth daily. TAKE 1 TABLET (5 MG TOTAL) BY MOUTH DAILY.)  . apixaban (ELIQUIS) 2.5 MG TABS tablet Take 1 tablet (2.5 mg total) by mouth 2 (two) times daily.  . bethanechol (URECHOLINE) 10 MG tablet TAKE 2 TABLETS AT BEDTIME AS NEEDED  . Cholecalciferol (VITAMIN D PO) Take 1 tablet by mouth daily.  Marland Kitchen erythromycin base (E-MYCIN) 500 MG tablet TAKE (1) TABLET THREE TIMES DAILY BEFORE MEALS FOR GASTRIC MOTILITY  . escitalopram (LEXAPRO) 20 MG tablet Take 2 tablets (40 mg total) by mouth daily.  Marland Kitchen EVENING PRIMROSE OIL PO Take 1 capsule by mouth daily.  . furosemide (LASIX) 20 MG tablet Take 1 tablet (20 mg total) by mouth daily as needed for fluid or edema.  Marland Kitchen levothyroxine (SYNTHROID, LEVOTHROID) 88 MCG tablet Take 1 tablet (88 mcg total) by mouth daily before breakfast.  . Melatonin 5 MG CAPS Take 5 mg by mouth at bedtime as needed (sleep).  . Multiple Vitamin (MULTIVITAMIN WITH MINERALS) TABS tablet Take 1 tablet by mouth daily.  Marland Kitchen olmesartan (BENICAR) 40 MG tablet   . Omega-3 Fatty Acids (FISH OIL PO) Take 1 tablet by mouth daily.  Marland Kitchen omeprazole-sodium bicarbonate (ZEGERID) 40-1100 MG capsule TAKE 1 CAPSULE BY MOUTH TWICE A DAY BEFORE BREAKFAST AND AT BEDTIME  . spironolactone (ALDACTONE) 25 MG tablet 12.5 mg.    No facility-administered encounter medications on file as of 07/14/2016.   * patient says that she has pain down left leg and it is causing sciatica- had an  injection by Dr. Durene Cal. She is concerned because she is loosing her balance a lot and has fallen a couple of times.  Hypertension This is a chronic problem. The current episode started more than 1 year ago. The problem is unchanged. The problem is controlled. Risk factors for coronary artery disease include dyslipidemia, obesity, post-menopausal state and smoking/tobacco exposure. Past treatments include calcium channel blockers and angiotensin blockers. The current treatment provides mild improvement. Compliance problems include diet and exercise.  Hypertensive end-organ damage includes a thyroid problem. There is no history of CAD/MI or CVA.  Hyperlipidemia This is a chronic problem. The current episode started more than 1 year ago. The problem is controlled. Recent lipid tests were reviewed and are normal. Exacerbating diseases include obesity. She has no history of diabetes or hypothyroidism. The current treatment provides moderate improvement of lipids. Compliance problems include adherence to diet and adherence to exercise.  Risk factors for coronary artery disease include dyslipidemia, hypertension, obesity and post-menopausal.  Thyroid Problem Presents for follow-up (hypothyroidism) visit. The symptoms have been stable. Her past medical history is significant for hyperlipidemia. There is no history of diabetes.  GERD/dysphagia zegrid- works well for her- depression  lexapro working well to keep her symptoms under control- keeps her from worrying so much. GAD She is still grieving the loss of her son several months ago- she says she still cries everyday.  CAD Had heart cath and was 30% blocked- was started on eliquis and is doing quite well- no  C/o abnormal bleeding or bruising.   Review of Systems  Constitutional: Negative.   HENT: Negative.   Respiratory: Negative.   Cardiovascular: Negative.   Gastrointestinal: Negative.   Genitourinary: Negative.   Neurological: Negative.     Psychiatric/Behavioral: Negative.   All other systems reviewed and are negative.      Objective:   Physical Exam  Constitutional: She is oriented to person, place, and time. She appears well-developed and well-nourished.  HENT:  Nose: Nose normal.  Mouth/Throat: Oropharynx is clear and moist.  Eyes: EOM are normal.  Neck: Trachea normal, normal range of motion and full passive range of motion without pain. Neck supple. No JVD present. Carotid bruit is not present. No thyromegaly present.  Cardiovascular: Normal rate, regular rhythm, normal heart sounds and intact distal pulses.  Exam reveals no gallop and no friction rub.   No murmur heard. Pulmonary/Chest: Effort normal and breath sounds normal.  Abdominal: Soft. Bowel sounds are normal. She exhibits no distension and no mass. There is no tenderness.  Musculoskeletal: Normal range of motion.  Lymphadenopathy:    She has no cervical adenopathy.  Neurological: She is alert and oriented to person, place, and time. She has normal reflexes.  Skin: Skin is warm and dry.  Psychiatric: She has a normal mood and affect. Her behavior is normal. Judgment and thought content normal.   BP 128/82 mmHg  Pulse 55  Temp(Src) 97.2 F (36.2 C) (Oral)  Ht '5\' 1"'$  (1.549 m)  Wt 145 lb (65.772 kg)  BMI 27.41 kg/m2       Assessment & Plan:  1. Essential hypertension Do not add salt to diet - furosemide (LASIX) 20 MG tablet; Take 1 tablet (20 mg total) by mouth daily as needed for fluid or edema.  Dispense: 90 tablet; Refill: 1 - amLODipine (NORVASC) 5 MG tablet; Take 0.5 tablets (2.5 mg total) by mouth daily. TAKE 1 TABLET (5 MG TOTAL) BY MOUTH DAILY.  Dispense: 90 tablet; Refill: 1 - olmesartan (BENICAR) 40 MG tablet; Take 1 tablet (40 mg total) by mouth daily.  Dispense: 90 tablet; Refill: 1 - CMP14+EGFR  2. Gastroesophageal reflux disease without esophagitis Avoid spicy foods Do not eat 2 hours prior to bedtime - omeprazole-sodium  bicarbonate (ZEGERID) 40-1100 MG capsule; TAKE 1 CAPSULE BY MOUTH TWICE A DAY BEFORE BREAKFAST AND AT BEDTIME  Dispense: 180 capsule; Refill: 1  3. Hypothyroidism, unspecified hypothyroidism type - levothyroxine (SYNTHROID, LEVOTHROID) 88 MCG tablet; Take 1 tablet (88 mcg total) by mouth daily before breakfast.  Dispense: 90 tablet; Refill: 1 - Thyroid Panel With TSH  4. Depression Stress management - escitalopram (LEXAPRO) 20 MG tablet; Take 2 tablets (40 mg total) by mouth daily.  Dispense: 180 tablet; Refill: 1  5. Hyperlipidemia Low fat diet - Lipid panel  6. BMI 27.0-27.9,adult Discussed diet and exercise for person with BMI >25 Will recheck weight in 3-6 months   7. Coronary artery disease involving native coronary artery of native heart without angina pectoris Continue eliquis - apixaban (ELIQUIS) 2.5 MG TABS tablet; Take 1 tablet (2.5 mg total) by mouth 2 (two) times daily.  Dispense: 60 tablet; Refill: 1  8. Unsteady gait Use walker in house - Ambulatory referral to Physical Therapy - Misc. Devices ([place]) MISC; Use daily for balance  Dispense: 1 each; Refill: 0    Labs pending Health maintenance reviewed Diet  and exercise encouraged Continue all meds Follow up  In 3 month   [place], [name]

## 2016-07-14 NOTE — Patient Instructions (Signed)
Fall Prevention in the Home  Falls can cause injuries and can affect people from all age groups. There are many simple things that you can do to make your home safe and to help prevent falls. WHAT CAN I DO ON THE OUTSIDE OF MY HOME?  Regularly repair the edges of walkways and driveways and fix any cracks.  Remove high doorway thresholds.  Trim any shrubbery on the main path into your home.  Use bright outdoor lighting.  Clear walkways of debris and clutter, including tools and rocks.  Regularly check that handrails are securely fastened and in good repair. Both sides of any steps should have handrails.  Install guardrails along the edges of any raised decks or porches.  Have leaves, snow, and ice cleared regularly.  Use sand or salt on walkways during winter months.  In the garage, clean up any spills right away, including grease or oil spills. WHAT CAN I DO IN THE BATHROOM?  Use night lights.  Install grab bars by the toilet and in the tub and shower. Do not use towel bars as grab bars.  Use non-skid mats or decals on the floor of the tub or shower.  If you need to sit down while you are in the shower, use a plastic, non-slip stool..  Keep the floor dry. Immediately clean up any water that spills on the floor.  Remove soap buildup in the tub or shower on a regular basis.  Attach bath mats securely with double-sided non-slip rug tape.  Remove throw rugs and other tripping hazards from the floor. WHAT CAN I DO IN THE BEDROOM?  Use night lights.  Make sure that a bedside light is easy to reach.  Do not use oversized bedding that drapes onto the floor.  Have a firm chair that has side arms to use for getting dressed.  Remove throw rugs and other tripping hazards from the floor. WHAT CAN I DO IN THE KITCHEN?   Clean up any spills right away.  Avoid walking on wet floors.  Place frequently used items in easy-to-reach places.  If you need to reach for something  above you, use a sturdy step stool that has a grab bar.  Keep electrical cables out of the way.  Do not use floor polish or wax that makes floors slippery. If you have to use wax, make sure that it is non-skid floor wax.  Remove throw rugs and other tripping hazards from the floor. WHAT CAN I DO IN THE STAIRWAYS?  Do not leave any items on the stairs.  Make sure that there are handrails on both sides of the stairs. Fix handrails that are broken or loose. Make sure that handrails are as long as the stairways.  Check any carpeting to make sure that it is firmly attached to the stairs. Fix any carpet that is loose or worn.  Avoid having throw rugs at the top or bottom of stairways, or secure the rugs with carpet tape to prevent them from moving.  Make sure that you have a light switch at the top of the stairs and the bottom of the stairs. If you do not have them, have them installed. WHAT ARE SOME OTHER FALL PREVENTION TIPS?  Wear closed-toe shoes that fit well and support your feet. Wear shoes that have rubber soles or low heels.  When you use a stepladder, make sure that it is completely opened and that the sides are firmly locked. Have someone hold the ladder while you   are using it. Do not climb a closed stepladder.  Add color or contrast paint or tape to grab bars and handrails in your home. Place contrasting color strips on the first and last steps.  Use mobility aids as needed, such as canes, walkers, scooters, and crutches.  Turn on lights if it is dark. Replace any light bulbs that burn out.  Set up furniture so that there are clear paths. Keep the furniture in the same spot.  Fix any uneven floor surfaces.  Choose a carpet design that does not hide the edge of steps of a stairway.  Be aware of any and all pets.  Review your medicines with your healthcare provider. Some medicines can cause dizziness or changes in blood pressure, which increase your risk of falling. Talk  with your health care provider about other ways that you can decrease your risk of falls. This may include working with a physical therapist or trainer to improve your strength, balance, and endurance.   This information is not intended to replace advice given to you by your health care provider. Make sure you discuss any questions you have with your health care provider.   Document Released: 12/03/2002 Document Revised: 04/29/2015 Document Reviewed: 01/17/2015 [employer] Interactive Patient Education 2016 [employer]  

## 2016-07-15 LAB — CMP14+EGFR
A/G RATIO: 1.9 (ref 1.2–2.2)
ALBUMIN: 3.9 g/dL (ref 3.5–4.7)
ALK PHOS: 66 IU/L (ref 39–117)
ALT: 84 IU/L — ABNORMAL HIGH (ref 0–32)
AST: 52 IU/L — ABNORMAL HIGH (ref 0–40)
BILIRUBIN TOTAL: 0.3 mg/dL (ref 0.0–1.2)
BUN / CREAT RATIO: 14 (ref 12–28)
BUN: 13 mg/dL (ref 8–27)
CHLORIDE: 97 mmol/L (ref 96–106)
CO2: 26 mmol/L (ref 18–29)
Calcium: 9.5 mg/dL (ref 8.7–10.3)
Creatinine, Ser: 0.96 mg/dL (ref 0.57–1.00)
GFR calc non Af Amer: 55 mL/min/{1.73_m2} — ABNORMAL LOW (ref >59)
GFR, EST AFRICAN AMERICAN: 63 mL/min/{1.73_m2} (ref >59)
GLOBULIN, TOTAL: 2.1 g/dL (ref 1.5–4.5)
GLUCOSE: 100 mg/dL — AB (ref 65–99)
POTASSIUM: 5.3 mmol/L — AB (ref 3.5–5.2)
SODIUM: 134 mmol/L (ref 134–144)
TOTAL PROTEIN: 6 g/dL (ref 6.0–8.5)

## 2016-07-15 LAB — THYROID PANEL WITH TSH
FREE THYROXINE INDEX: 3.6 (ref 1.2–4.9)
T3 UPTAKE RATIO: 33 % (ref 24–39)
T4 TOTAL: 10.9 ug/dL (ref 4.5–12.0)
TSH: 2.04 u[IU]/mL (ref 0.450–4.500)

## 2016-07-15 LAB — LIPID PANEL
Chol/HDL Ratio: 4.6 ratio units — ABNORMAL HIGH (ref 0.0–4.4)
Cholesterol, Total: 225 mg/dL — ABNORMAL HIGH (ref 100–199)
HDL: 49 mg/dL (ref >39)
LDL Calculated: 144 mg/dL — ABNORMAL HIGH (ref 0–99)
Triglycerides: 158 mg/dL — ABNORMAL HIGH (ref 0–149)
VLDL CHOLESTEROL CAL: 32 mg/dL (ref 5–40)

## 2016-07-23 DIAGNOSIS — Z6826 Body mass index (BMI) 26.0-26.9, adult: Secondary | ICD-10-CM | POA: Diagnosis not present

## 2016-07-23 DIAGNOSIS — M4806 Spinal stenosis, lumbar region: Secondary | ICD-10-CM | POA: Diagnosis not present

## 2016-07-23 DIAGNOSIS — R29898 Other symptoms and signs involving the musculoskeletal system: Secondary | ICD-10-CM | POA: Diagnosis not present

## 2016-07-23 DIAGNOSIS — M47816 Spondylosis without myelopathy or radiculopathy, lumbar region: Secondary | ICD-10-CM | POA: Diagnosis not present

## 2016-07-23 DIAGNOSIS — M5136 Other intervertebral disc degeneration, lumbar region: Secondary | ICD-10-CM | POA: Diagnosis not present

## 2016-07-23 DIAGNOSIS — M5416 Radiculopathy, lumbar region: Secondary | ICD-10-CM | POA: Diagnosis not present

## 2016-07-28 DIAGNOSIS — M545 Low back pain: Secondary | ICD-10-CM | POA: Diagnosis not present

## 2016-07-28 DIAGNOSIS — M5416 Radiculopathy, lumbar region: Secondary | ICD-10-CM | POA: Diagnosis not present

## 2016-08-03 ENCOUNTER — Ambulatory Visit: Payer: Medicare Other

## 2016-08-09 ENCOUNTER — Other Ambulatory Visit: Payer: Self-pay | Admitting: Nurse Practitioner

## 2016-08-09 DIAGNOSIS — I10 Essential (primary) hypertension: Secondary | ICD-10-CM | POA: Diagnosis not present

## 2016-08-09 DIAGNOSIS — Z6826 Body mass index (BMI) 26.0-26.9, adult: Secondary | ICD-10-CM | POA: Diagnosis not present

## 2016-08-09 DIAGNOSIS — E785 Hyperlipidemia, unspecified: Secondary | ICD-10-CM | POA: Diagnosis not present

## 2016-08-09 DIAGNOSIS — I480 Paroxysmal atrial fibrillation: Secondary | ICD-10-CM | POA: Diagnosis not present

## 2016-08-09 DIAGNOSIS — I2510 Atherosclerotic heart disease of native coronary artery without angina pectoris: Secondary | ICD-10-CM | POA: Diagnosis not present

## 2016-08-09 DIAGNOSIS — R29898 Other symptoms and signs involving the musculoskeletal system: Secondary | ICD-10-CM | POA: Diagnosis not present

## 2016-08-09 DIAGNOSIS — M47816 Spondylosis without myelopathy or radiculopathy, lumbar region: Secondary | ICD-10-CM | POA: Diagnosis not present

## 2016-08-09 DIAGNOSIS — M5136 Other intervertebral disc degeneration, lumbar region: Secondary | ICD-10-CM | POA: Diagnosis not present

## 2016-08-09 DIAGNOSIS — E039 Hypothyroidism, unspecified: Secondary | ICD-10-CM | POA: Diagnosis not present

## 2016-08-09 DIAGNOSIS — M4806 Spinal stenosis, lumbar region: Secondary | ICD-10-CM | POA: Diagnosis not present

## 2016-08-09 DIAGNOSIS — M1990 Unspecified osteoarthritis, unspecified site: Secondary | ICD-10-CM | POA: Diagnosis not present

## 2016-08-12 ENCOUNTER — Telehealth: Payer: Self-pay | Admitting: Nurse Practitioner

## 2016-08-25 ENCOUNTER — Ambulatory Visit (INDEPENDENT_AMBULATORY_CARE_PROVIDER_SITE_OTHER): Payer: Medicare Other | Admitting: Neurology

## 2016-08-25 ENCOUNTER — Encounter: Payer: Self-pay | Admitting: Neurology

## 2016-08-25 VITALS — BP 127/61 | HR 61 | Ht 61.0 in | Wt 144.0 lb

## 2016-08-25 DIAGNOSIS — R269 Unspecified abnormalities of gait and mobility: Secondary | ICD-10-CM | POA: Diagnosis not present

## 2016-08-25 DIAGNOSIS — E538 Deficiency of other specified B group vitamins: Secondary | ICD-10-CM | POA: Diagnosis not present

## 2016-08-25 DIAGNOSIS — M21372 Foot drop, left foot: Secondary | ICD-10-CM | POA: Diagnosis not present

## 2016-08-25 DIAGNOSIS — I2510 Atherosclerotic heart disease of native coronary artery without angina pectoris: Secondary | ICD-10-CM | POA: Diagnosis not present

## 2016-08-25 HISTORY — DX: Foot drop, left foot: M21.372

## 2016-08-25 HISTORY — DX: Unspecified abnormalities of gait and mobility: R26.9

## 2016-08-25 NOTE — Patient Instructions (Addendum)
Fall Prevention in the Home  Falls can cause injuries and can affect people from all age groups. There are many simple things that you can do to make your home safe and to help prevent falls. WHAT CAN I DO ON THE OUTSIDE OF MY HOME?  Regularly repair the edges of walkways and driveways and fix any cracks.  Remove high doorway thresholds.  Trim any shrubbery on the main path into your home.  Use bright outdoor lighting.  Clear walkways of debris and clutter, including tools and rocks.  Regularly check that handrails are securely fastened and in good repair. Both sides of any steps should have handrails.  Install guardrails along the edges of any raised decks or porches.  Have leaves, snow, and ice cleared regularly.  Use sand or salt on walkways during winter months.  In the garage, clean up any spills right away, including grease or oil spills. WHAT CAN I DO IN THE BATHROOM?  Use night lights.  Install grab bars by the toilet and in the tub and shower. Do not use towel bars as grab bars.  Use non-skid mats or decals on the floor of the tub or shower.  If you need to sit down while you are in the shower, use a plastic, non-slip stool..  Keep the floor dry. Immediately clean up any water that spills on the floor.  Remove soap buildup in the tub or shower on a regular basis.  Attach bath mats securely with double-sided non-slip rug tape.  Remove throw rugs and other tripping hazards from the floor. WHAT CAN I DO IN THE BEDROOM?  Use night lights.  Make sure that a bedside light is easy to reach.  Do not use oversized bedding that drapes onto the floor.  Have a firm chair that has side arms to use for getting dressed.  Remove throw rugs and other tripping hazards from the floor. WHAT CAN I DO IN THE KITCHEN?   Clean up any spills right away.  Avoid walking on wet floors.  Place frequently used items in easy-to-reach places.  If you need to reach for something  above you, use a sturdy step stool that has a grab bar.  Keep electrical cables out of the way.  Do not use floor polish or wax that makes floors slippery. If you have to use wax, make sure that it is non-skid floor wax.  Remove throw rugs and other tripping hazards from the floor. WHAT CAN I DO IN THE STAIRWAYS?  Do not leave any items on the stairs.  Make sure that there are handrails on both sides of the stairs. Fix handrails that are broken or loose. Make sure that handrails are as long as the stairways.  Check any carpeting to make sure that it is firmly attached to the stairs. Fix any carpet that is loose or worn.  Avoid having throw rugs at the top or bottom of stairways, or secure the rugs with carpet tape to prevent them from moving.  Make sure that you have a light switch at the top of the stairs and the bottom of the stairs. If you do not have them, have them installed. WHAT ARE SOME OTHER FALL PREVENTION TIPS?  Wear closed-toe shoes that fit well and support your feet. Wear shoes that have rubber soles or low heels.  When you use a stepladder, make sure that it is completely opened and that the sides are firmly locked. Have someone hold the ladder while you   are using it. Do not climb a closed stepladder.  Add color or contrast paint or tape to grab bars and handrails in your home. Place contrasting color strips on the first and last steps.  Use mobility aids as needed, such as canes, walkers, scooters, and crutches.  Turn on lights if it is dark. Replace any light bulbs that burn out.  Set up furniture so that there are clear paths. Keep the furniture in the same spot.  Fix any uneven floor surfaces.  Choose a carpet design that does not hide the edge of steps of a stairway.  Be aware of any and all pets.  Review your medicines with your healthcare provider. Some medicines can cause dizziness or changes in blood pressure, which increase your risk of falling. Talk  with your health care provider about other ways that you can decrease your risk of falls. This may include working with a physical therapist or trainer to improve your strength, balance, and endurance.   This information is not intended to replace advice given to you by your health care provider. Make sure you discuss any questions you have with your health care provider.   Document Released: 12/03/2002 Document Revised: 04/29/2015 Document Reviewed: 01/17/2015 Elsevier Interactive Patient Education 2016 [employer]  

## 2016-08-25 NOTE — Progress Notes (Signed)
Reason for visit: Gait disorder  Referring physician: Dr. Lorayne Bender Erica [name] is a 82 y.o. female  History of present illness:  Erica [name] is an 82 year old right-handed white female with a history of chronic low back pain dating back for 5 years. The patient has had a recent MRI of the lumbar spine that is not available for my review done by Dr. Sherwood Gambler within the last couple weeks. The patient apparently has evidence of neuroforaminal stenosis affecting the left L5 nerve root. The patient has had issues with her walking that have worsened over the last year, she has had multiple falls. The patient denies any numbness in the feet, she feels as if the left leg has always been weaker, but she was recently noted to have some proximal weakness in both legs. The patient does report some neck pain without radiation down the arms. The patient last fell on 06/29/2016 and did hit her head. The patient has had a recent MRI of the brain that shows very minimal white matter changes. The patient reports chronic constipation, gastroparesis, she denies any issues controlling the bladder. She denies any memory problems or difficulty with dizziness. She has had about a 20 pound weight loss over the last one year. The patient is sent to this office for further evaluation of the leg weakness.  Past Medical History:  Diagnosis Date  . Adenomatous rectal polyp   . Atrial fibrillation ([place])   . Bleeding internal hemorrhoids   . Chronic back pain   . Diverticulosis of colon (without mention of hemorrhage)   . Gastroparesis   . GERD (gastroesophageal reflux disease)   . Hiatal hernia 09/1999   3cm  . Hyperlipidemia   . Hypertension   . Hypothyroid   . Ischemic colitis ([place])   . Lichen sclerosus et atrophicus of the vulva   . MVP (mitral valve prolapse)   . TIA (transient ischemic attack)     Past Surgical History:  Procedure Laterality Date  . CARDIAC CATHETERIZATION N/A 02/03/2016   Procedure:  Coronary/Graft Angiography;  Surgeon: Charolette Forward, MD;  Location: [facility];  Service: Cardiovascular;  Laterality: N/A;  . CATARACT EXTRACTION    . CHOLECYSTECTOMY    . COLONOSCOPY     Multiple, polyps  . ESOPHAGEAL MANOMETRY N/A 06/14/2016   Procedure: ESOPHAGEAL MANOMETRY (EM);  Surgeon: Gatha Mayer, MD;  Location: [facility];  Service: Endoscopy;  Laterality: N/A;  . LEFT HEART CATHETERIZATION WITH CORONARY ANGIOGRAM N/A 07/17/2013   Procedure: LEFT HEART CATHETERIZATION WITH CORONARY ANGIOGRAM;  Surgeon: Clent Demark, MD;  Location: [facility];  Service: Cardiovascular;  Laterality: N/A;  . SIGMOIDOSCOPY  08/23/2011  . TUBAL LIGATION    . UPPER GASTROINTESTINAL ENDOSCOPY  08/23/2011   Normal    Family History  Problem Relation Age of Onset  . Stomach cancer Brother   . Colon cancer Paternal Aunt   . Lung cancer Brother     smoker  . Esophageal cancer Father     Social history:  reports that she has never smoked. She has never used smokeless tobacco. She reports that she does not drink alcohol or use drugs.  Medications:  Prior to Admission medications   Medication Sig Start Date End Date Taking? Authorizing Provider  amiodarone (PACERONE) 200 MG tablet Take 1 tablet (200 mg total) by mouth daily. 03/24/16   Mary-Margaret Hassell Done, [name]  amLODipine (NORVASC) 5 MG tablet Take 0.5 tablets (2.5 mg total) by mouth daily.  TAKE 1 TABLET (5 MG TOTAL) BY MOUTH DAILY. 07/14/16   Mary-Margaret Hassell Done, [name]  apixaban (ELIQUIS) 2.5 MG TABS tablet Take 1 tablet (2.5 mg total) by mouth 2 (two) times daily. 07/14/16   Mary-Margaret Hassell Done, [name]  bethanechol (URECHOLINE) 10 MG tablet TAKE 2 TABLETS AT BEDTIME AS NEEDED 05/10/16   Gatha Mayer, MD  Cholecalciferol (VITAMIN D PO) Take 1 tablet by mouth daily.    Historical Provider, MD  erythromycin base (E-MYCIN) 500 MG tablet TAKE (1) TABLET THREE TIMES DAILY BEFORE MEALS FOR GASTRIC MOTILITY 08/09/16   Mary-Margaret Hassell Done, [name]    escitalopram (LEXAPRO) 20 MG tablet Take 2 tablets (40 mg total) by mouth daily. 07/14/16   Mary-Margaret Hassell Done, [name]  EVENING PRIMROSE OIL PO Take 1 capsule by mouth daily.    Historical Provider, MD  furosemide (LASIX) 20 MG tablet Take 1 tablet (20 mg total) by mouth daily as needed for fluid or edema. 07/14/16   Mary-Margaret Hassell Done, [name]  levothyroxine (SYNTHROID, LEVOTHROID) 88 MCG tablet Take 1 tablet (88 mcg total) by mouth daily before breakfast. 07/14/16   Mary-Margaret Hassell Done, [name]  Melatonin 5 MG CAPS Take 5 mg by mouth at bedtime as needed (sleep).    Historical Provider, MD  Misc. Devices (HUGO ROLLING WALKER ELITE) MISC Use daily for balance 07/14/16   Mary-Margaret Hassell Done, [name]  Multiple Vitamin (MULTIVITAMIN WITH MINERALS) TABS tablet Take 1 tablet by mouth daily.    Historical Provider, MD  olmesartan (BENICAR) 40 MG tablet Take 1 tablet (40 mg total) by mouth daily. 07/14/16   Mary-Margaret Hassell Done, [name]  Omega-3 Fatty Acids (FISH OIL PO) Take 1 tablet by mouth daily.    Historical Provider, MD  omeprazole-sodium bicarbonate (ZEGERID) 40-1100 MG capsule TAKE 1 CAPSULE BY MOUTH TWICE A DAY BEFORE BREAKFAST AND AT BEDTIME 07/14/16   Mary-Margaret Hassell Done, [name]  spironolactone (ALDACTONE) 25 MG tablet 12.5 mg.  04/28/16   Historical Provider, MD      Allergies  Allergen Reactions  . Contrast Media [Iodinated Diagnostic Agents] Shortness Of Breath, Nausea Only and Swelling  . Iodine Shortness Of Breath, Nausea Only and Swelling  . Iohexol Hives and Swelling     Desc: hives, swelling over 5 yrs ago-pt has never been pre-medicated--was told to not have iv contrast ever   . Ioxaglate Nausea Only, Shortness Of Breath and Swelling  . Metoclopramide Other (See Comments)    Mouth tremors, insomnia, and irritability    ROS:  Out of a complete 14 system review of symptoms, the patient complains only of the following symptoms, and all other reviewed systems are negative.  Fatigue Palpitations  of the heart, heart murmur, swelling in the legs Hearing loss Constipation Easy bruising, easy bleeding, lymph node swelling Feeling cold Achy muscles Gait disturbance  There were no vitals taken for this visit.  Physical Exam  General: The patient is alert and cooperative at the time of the examination.  Eyes: Pupils are equal, round, and reactive to light. Discs are flat bilaterally.  Neck: The neck is supple, no carotid bruits are noted.  Respiratory: The respiratory examination is clear.  Cardiovascular: The cardiovascular examination reveals a regular rate and rhythm, no obvious murmurs or rubs are noted.  Skin: Extremities are with 1+ edema at the ankles bilaterally.  Neurologic Exam:  Mental status: The patient is alert and oriented x 3 at the time of the examination. The patient has apparent normal recent and remote memory, with an apparently normal attention span and concentration  ability.  Cranial nerves: Facial symmetry is present. There is good sensation of the face to pinprick and soft touch bilaterally. The strength of the facial muscles and the muscles to head turning and shoulder shrug are normal bilaterally. Speech is well enunciated, no aphasia or dysarthria is noted. Extraocular movements are full. Visual fields are full. The tongue is midline, and the patient has symmetric elevation of the soft palate. No obvious hearing deficits are noted.  Motor: The motor testing reveals 5 over 5 strength of all 4 extremities, with exception of a left-sided foot drop. There may be minimal weakness with hip flexion bilaterally, but what is mainly seen is giveaway weakness. Good symmetric motor tone is noted throughout.  Sensory: Sensory testing is intact to pinprick, soft touch, vibration sensation, and position sense on all 4 extremities, with exception of some decreased pinprick sensation on the left arm and left leg, decreased position sense of the left foot. No evidence of  extinction is noted.  Coordination: Cerebellar testing reveals good finger-nose-finger and heel-to-shin bilaterally.  Gait and station: Gait is relatively unremarkable. Tandem gait is unsteady. Romberg is negative. No drift is seen.  Reflexes: Deep tendon reflexes are symmetric and normal bilaterally, with exception of a depression of the left ankle jerk reflex. Toes are downgoing bilaterally.   MRI brain 07/01/16:  IMPRESSION: No acute infarct or intracranial hemorrhage.  Mild to moderate small vessel disease changes.  Mild to moderate global atrophy without hydrocephalus.  * MRI scan images were reviewed online. I agree with the written report.    Assessment/Plan:  1. History of gait disturbance  2. Left footdrop, L5 radiculopathy  The patient does appear to have a left footdrop which may be leading to some of her stumbles or falls. The patient will be sent for further blood work today, she will be set up for nerve conduction studies on both legs and EMG evaluation on both legs. If no other abnormalities are noted, it is possible that a left AFO brace combined with physical therapy for gait stability may be effective in preventing many of her falls. She will follow-up for the EMG evaluation.   Jill Alexanders MD 08/25/2016 3:10 PM  [facility] [address] [place], [place] [address]  Phone [phone] Fax [phone]

## 2016-08-27 ENCOUNTER — Telehealth: Payer: Self-pay

## 2016-08-27 LAB — ACETYLCHOLINE RECEPTOR, BINDING

## 2016-08-27 LAB — CK: CK TOTAL: 35 U/L (ref 24–173)

## 2016-08-27 LAB — COPPER, SERUM: COPPER: 106 ug/dL (ref 72–166)

## 2016-08-27 LAB — RPR: RPR Ser Ql: NONREACTIVE

## 2016-08-27 LAB — VITAMIN B12: VITAMIN B 12: 1138 pg/mL — AB (ref 211–946)

## 2016-08-27 NOTE — Telephone Encounter (Signed)
Called pt w/ unremarkable lab results. Verbalized understanding and appreciation for call. 

## 2016-08-27 NOTE — Telephone Encounter (Signed)
-----   Message from Kathrynn Ducking, MD sent at 08/27/2016  7:23 AM EDT -----  The blood work results are unremarkable. Please call the patient.  ----- Message ----- From: Lavone Neri Lab Results In Sent: 08/26/2016   7:40 AM To: Kathrynn Ducking, MD

## 2016-08-31 ENCOUNTER — Telehealth: Payer: Self-pay | Admitting: Internal Medicine

## 2016-09-01 ENCOUNTER — Encounter: Payer: Self-pay | Admitting: Family Medicine

## 2016-09-01 ENCOUNTER — Ambulatory Visit (INDEPENDENT_AMBULATORY_CARE_PROVIDER_SITE_OTHER): Payer: Medicare Other | Admitting: Family Medicine

## 2016-09-01 ENCOUNTER — Ambulatory Visit (INDEPENDENT_AMBULATORY_CARE_PROVIDER_SITE_OTHER): Payer: Medicare Other

## 2016-09-01 VITALS — BP 138/66 | HR 53 | Temp 97.9°F | Ht 61.0 in | Wt 143.2 lb

## 2016-09-01 DIAGNOSIS — E039 Hypothyroidism, unspecified: Secondary | ICD-10-CM

## 2016-09-01 DIAGNOSIS — R1030 Lower abdominal pain, unspecified: Secondary | ICD-10-CM

## 2016-09-01 DIAGNOSIS — R197 Diarrhea, unspecified: Secondary | ICD-10-CM

## 2016-09-01 DIAGNOSIS — I2510 Atherosclerotic heart disease of native coronary artery without angina pectoris: Secondary | ICD-10-CM

## 2016-09-01 NOTE — Progress Notes (Signed)
Subjective:  Patient ID: Erica [name], female    DOB: 1930/02/11  Age: 82 y.o. MRN: [id number]  CC: Abdominal Pain (pt has noticed blood in stools and has had constipation then loose runny stools, she is very concerned. She has an appt with the GI tomorrow.)   HPI TRENT THEISEN presents for 2-3 months of constipation. She is been using prune juice and stool softeners. Those seem to help her have a bowel movement but is just liquidy. The liquidy stool seems to have been occurring primarily over the last 2-3 weeks. Over the last several days she has noticed increased lower abdominal pain and going along with that. Additionally she is having significant amount of blood with her stool. She has no appointment to see a GI specialist tomorrow. However due to the blood she wanted to get checked out to make sure she was okay to travel to [facility] to see the new physician at the [facility] practice. Her appetite has been poor to fair. She has not had any nausea or vomiting. She does take the blood thinner Eliquis due to her history of atrial fibrillation. She denies any other bleeding other than the rectal bleeding. Specifically no excessive bruising. No bleeding from the gums. No hematuria and no vaginal bleeding.   History Lyrah has a past medical history of Abnormality of gait (08/25/2016); Adenomatous rectal polyp; Atrial fibrillation ([place]); Bleeding internal hemorrhoids; Chronic back pain; Diverticulosis of colon (without mention of hemorrhage); Gastroparesis; GERD (gastroesophageal reflux disease); Hiatal hernia (09/1999); Hyperlipidemia; Hypertension; Hypothyroid; Ischemic colitis ([place]); Left foot drop (08/25/2016); Lichen sclerosus et atrophicus of the vulva; MVP (mitral valve prolapse); TIA (transient ischemic attack); and Weakness.   She has a past surgical history that includes Cataract extraction; Upper gastrointestinal endoscopy (08/23/2011); Sigmoidoscopy (08/23/2011); Tubal ligation;  Cholecystectomy; left heart catheterization with coronary angiogram (N/A, 07/17/2013); Colonoscopy; Cardiac catheterization (N/A, 02/03/2016); and Esophageal manometry (N/A, 06/14/2016).   Her family history includes Colon cancer in her paternal aunt; Esophageal cancer in her father; Lung cancer in her brother; Stomach cancer in her brother; Stroke in her mother.She reports that she has never smoked. She has never used smokeless tobacco. She reports that she does not drink alcohol or use drugs.    ROS Review of Systems  Constitutional: Negative for activity change, appetite change and fever.  HENT: Negative for congestion, rhinorrhea and sore throat.   Eyes: Negative for visual disturbance.  Respiratory: Negative for cough and shortness of breath.   Cardiovascular: Negative for chest pain and palpitations.  Gastrointestinal: Positive for abdominal distention, abdominal pain and constipation. Negative for diarrhea and nausea.  Genitourinary: Negative for dysuria.  Musculoskeletal: Negative for arthralgias and myalgias.    Objective:  BP 138/66   Pulse (!) 53   Temp 97.9 F (36.6 C) (Oral)   Ht _0  (1.549 m)   Wt 143 lb 4 oz (65 kg)   SpO2 100%   BMI 27.07 kg/m   BP Readings from Last 3 Encounters:  09/01/16 138/66  08/25/16 127/61  07/14/16 128/82    Wt Readings from Last 3 Encounters:  09/01/16 143 lb 4 oz (65 kg)  08/25/16 144 lb (65.3 kg)  07/14/16 145 lb (65.8 kg)     Physical Exam  Constitutional: She is oriented to person, place, and time. She appears well-developed and well-nourished.  HENT:  Head: Normocephalic and atraumatic.  Cardiovascular: Normal rate and regular rhythm.   No murmur heard. Pulmonary/Chest: Effort normal and breath sounds normal.  Abdominal: Soft. Bowel sounds are normal. She exhibits no mass. There is tenderness Valinda Hoar lower quadrants). There is no rebound and no guarding.  Neurological: She is alert and oriented to person, place, and time.    Skin: Skin is warm and dry.  Psychiatric: She has a normal mood and affect. Her behavior is normal.     Lab Results  Component Value Date   WBC 7.7 07/01/2016   HGB 13.6 07/01/2016   HCT 40.0 07/01/2016   PLT 198 07/01/2016   GLUCOSE 100 (H) 07/14/2016   CHOL 225 (H) 07/14/2016   TRIG 158 (H) 07/14/2016   HDL 49 07/14/2016   LDLCALC 144 (H) 07/14/2016   ALT 84 (H) 07/14/2016   AST 52 (H) 07/14/2016   NA 134 07/14/2016   K 5.3 (H) 07/14/2016   CL 97 07/14/2016   CREATININE 0.96 07/14/2016   BUN 13 07/14/2016   CO2 26 07/14/2016   TSH 2.040 07/14/2016   INR 1.04 07/01/2016   MICROALBUR 7.6 (H) 02/01/2016    Ct Head Wo Contrast  Result Date: 07/01/2016 CLINICAL DATA:  Increased falls.   Rule out stroke. EXAM: CT HEAD WITHOUT CONTRAST TECHNIQUE: Contiguous axial images were obtained from the base of the skull through the vertex without intravenous contrast. COMPARISON:  04/21/2016. FINDINGS: Brain: Mild low density in the periventricular white matter likely related to small vessel disease. Expected cerebral volume loss for age. No mass lesion, hemorrhage, hydrocephalus, acute infarct, intra-axial, or extra-axial fluid collection. Vascular: No hyperdense vessel or unexpected calcification. Skull: No significant soft tissue swelling.  No skull fracture. Sinuses/Orbits: Minimal fluid in the dependent sphenoid sinus. Other paranasal sinuses and mastoid air cells are clear. Normal orbits and globes. Other: None IMPRESSION: 1.  No acute intracranial abnormality. 2. Small vessel ischemic change. 3. Minimal fluid in the dependent sphenoid sinus, favored to relate to mild sinusitis. Electronically Signed   By: Abigail Miyamoto M.D.   On: 07/01/2016 18:43   Mr Brain Wo Contrast  Result Date: 07/01/2016 CLINICAL DATA:  82 year old hypertensive female with history of increasing falls and weakness. Falls because of dizziness. Intermittent head and back pain over the past few weeks. Subsequent  encounter. EXAM: MRI HEAD WITHOUT CONTRAST TECHNIQUE: Multiplanar, multiecho pulse sequences of the brain and surrounding structures were obtained without intravenous contrast. COMPARISON:  07/01/2016 head CT.  07/21/2011 brain MR. FINDINGS: No acute infarct or intracranial hemorrhage. Mild to moderate small vessel disease changes. Mild to moderate global atrophy without hydrocephalus. No intracranial mass lesion noted on this unenhanced exam. Major intracranial vascular structures are patent. Post lens replacement without acute orbital abnormality. Cervical medullary junction unremarkable. IMPRESSION: No acute infarct or intracranial hemorrhage. Mild to moderate small vessel disease changes. Mild to moderate global atrophy without hydrocephalus. Electronically Signed   By: Genia Del M.D.   On: 07/01/2016 21:52    Assessment & Plan:   Ronnita was seen today for abdominal pain.  Diagnoses and all orders for this visit:  Lower abdominal pain -     CBC with Differential/Platelet -     CMP14+EGFR -     Amylase -     Lipase -     DG Abd 2 Views; Future  Hypothyroidism, unspecified hypothyroidism type -     CBC with Differential/Platelet -     CMP14+EGFR -     TSH + free T4  Diarrhea, unspecified type -     CBC with Differential/Platelet -     CMP14+EGFR -  DG Abd 2 Views; Future    Abd - differential air fluid levels plus XS stool - ileus.   I am having Ms. Seide maintain her Melatonin, Omega-3 Fatty Acids (FISH OIL PO), Cholecalciferol (VITAMIN D PO), multivitamin with minerals, EVENING PRIMROSE OIL PO, amiodarone, bethanechol, spironolactone, apixaban, escitalopram, furosemide, amLODipine, omeprazole-sodium bicarbonate, levothyroxine, olmesartan, HUGO ROLLING WALKER ELITE, erythromycin base, and ALPRAZolam.  No orders of the defined types were placed in this encounter.    Follow-up: Return in about 2 weeks (around 09/15/2016).  Claretta Fraise, M.D.

## 2016-09-02 ENCOUNTER — Other Ambulatory Visit: Payer: Self-pay | Admitting: Family Medicine

## 2016-09-02 DIAGNOSIS — K5900 Constipation, unspecified: Secondary | ICD-10-CM | POA: Diagnosis not present

## 2016-09-02 DIAGNOSIS — K625 Hemorrhage of anus and rectum: Secondary | ICD-10-CM | POA: Diagnosis not present

## 2016-09-02 LAB — CBC WITH DIFFERENTIAL/PLATELET
BASOS ABS: 0 10*3/uL (ref 0.0–0.2)
Basos: 0 %
EOS (ABSOLUTE): 0.1 10*3/uL (ref 0.0–0.4)
Eos: 1 %
HEMOGLOBIN: 12.7 g/dL (ref 11.1–15.9)
Hematocrit: 37.7 % (ref 34.0–46.6)
IMMATURE GRANULOCYTES: 0 %
Immature Grans (Abs): 0 10*3/uL (ref 0.0–0.1)
LYMPHS ABS: 3.2 10*3/uL — AB (ref 0.7–3.1)
Lymphs: 32 %
MCH: 31.1 pg (ref 26.6–33.0)
MCHC: 33.7 g/dL (ref 31.5–35.7)
MCV: 92 fL (ref 79–97)
MONOCYTES: 11 %
Monocytes Absolute: 1.1 10*3/uL — ABNORMAL HIGH (ref 0.1–0.9)
NEUTROS PCT: 56 %
Neutrophils Absolute: 5.5 10*3/uL (ref 1.4–7.0)
Platelets: 292 10*3/uL (ref 150–379)
RBC: 4.08 x10E6/uL (ref 3.77–5.28)
RDW: 13.5 % (ref 12.3–15.4)
WBC: 10 10*3/uL (ref 3.4–10.8)

## 2016-09-02 LAB — CMP14+EGFR
ALBUMIN: 3.7 g/dL (ref 3.5–4.7)
ALK PHOS: 78 IU/L (ref 39–117)
ALT: 156 IU/L — ABNORMAL HIGH (ref 0–32)
AST: 90 IU/L — AB (ref 0–40)
Albumin/Globulin Ratio: 1.5 (ref 1.2–2.2)
BUN/Creatinine Ratio: 13 (ref 12–28)
BUN: 11 mg/dL (ref 8–27)
Bilirubin Total: 0.4 mg/dL (ref 0.0–1.2)
CO2: 26 mmol/L (ref 18–29)
CREATININE: 0.85 mg/dL (ref 0.57–1.00)
Calcium: 9.6 mg/dL (ref 8.7–10.3)
Chloride: 100 mmol/L (ref 96–106)
GFR calc Af Amer: 73 mL/min/{1.73_m2} (ref >59)
GFR calc non Af Amer: 64 mL/min/{1.73_m2} (ref >59)
GLUCOSE: 96 mg/dL (ref 65–99)
Globulin, Total: 2.4 g/dL (ref 1.5–4.5)
Potassium: 4.9 mmol/L (ref 3.5–5.2)
Sodium: 139 mmol/L (ref 134–144)
Total Protein: 6.1 g/dL (ref 6.0–8.5)

## 2016-09-02 LAB — AMYLASE: AMYLASE: 32 U/L (ref 31–124)

## 2016-09-02 LAB — TSH+FREE T4
FREE T4: 1.79 ng/dL — AB (ref 0.82–1.77)
TSH: 3.05 u[IU]/mL (ref 0.450–4.500)

## 2016-09-02 LAB — LIPASE: LIPASE: 37 U/L (ref 0–59)

## 2016-09-02 NOTE — Telephone Encounter (Signed)
Left message for patient to call back  

## 2016-09-02 NOTE — Telephone Encounter (Signed)
Patient returned call and stated her bleeding stopped and is fine with appt in October

## 2016-09-08 ENCOUNTER — Other Ambulatory Visit: Payer: Self-pay | Admitting: Nurse Practitioner

## 2016-09-08 ENCOUNTER — Other Ambulatory Visit: Payer: Medicare Other

## 2016-09-08 DIAGNOSIS — E782 Mixed hyperlipidemia: Secondary | ICD-10-CM | POA: Diagnosis not present

## 2016-09-08 DIAGNOSIS — R748 Abnormal levels of other serum enzymes: Secondary | ICD-10-CM | POA: Diagnosis not present

## 2016-09-09 LAB — HEPATIC FUNCTION PANEL
ALBUMIN: 3.9 g/dL (ref 3.5–4.7)
ALK PHOS: 75 IU/L (ref 39–117)
ALT: 184 IU/L — ABNORMAL HIGH (ref 0–32)
AST: 118 IU/L — AB (ref 0–40)
BILIRUBIN TOTAL: 0.3 mg/dL (ref 0.0–1.2)
Bilirubin, Direct: 0.09 mg/dL (ref 0.00–0.40)
TOTAL PROTEIN: 6.1 g/dL (ref 6.0–8.5)

## 2016-09-10 ENCOUNTER — Other Ambulatory Visit: Payer: Self-pay | Admitting: Family Medicine

## 2016-09-13 ENCOUNTER — Other Ambulatory Visit: Payer: Self-pay | Admitting: *Deleted

## 2016-09-13 DIAGNOSIS — R748 Abnormal levels of other serum enzymes: Secondary | ICD-10-CM

## 2016-09-14 ENCOUNTER — Other Ambulatory Visit: Payer: Self-pay | Admitting: *Deleted

## 2016-09-14 DIAGNOSIS — R748 Abnormal levels of other serum enzymes: Secondary | ICD-10-CM

## 2016-09-14 LAB — SPECIMEN STATUS REPORT

## 2016-09-14 LAB — HEPATITIS PANEL, ACUTE
Hep A IgM: UNDETERMINED — AB
Hep B C IgM: NEGATIVE
Hepatitis B Surface Ag: NEGATIVE

## 2016-09-16 ENCOUNTER — Other Ambulatory Visit: Payer: Medicare Other

## 2016-09-16 DIAGNOSIS — R748 Abnormal levels of other serum enzymes: Secondary | ICD-10-CM | POA: Diagnosis not present

## 2016-09-16 DIAGNOSIS — R7989 Other specified abnormal findings of blood chemistry: Secondary | ICD-10-CM | POA: Diagnosis not present

## 2016-09-16 NOTE — Addendum Note (Signed)
Addended by: Wardell Heath on: 09/16/2016 12:34 PM   Modules accepted: Orders

## 2016-09-20 ENCOUNTER — Ambulatory Visit (HOSPITAL_COMMUNITY): Admission: RE | Admit: 2016-09-20 | Payer: Medicare Other | Source: Ambulatory Visit

## 2016-09-21 ENCOUNTER — Telehealth: Payer: Self-pay | Admitting: Nurse Practitioner

## 2016-09-21 NOTE — Telephone Encounter (Signed)
Labs aren't back yet

## 2016-09-22 ENCOUNTER — Other Ambulatory Visit: Payer: Self-pay | Admitting: Nurse Practitioner

## 2016-09-22 DIAGNOSIS — Z23 Encounter for immunization: Secondary | ICD-10-CM

## 2016-09-22 NOTE — Telephone Encounter (Signed)
Pt calling back about lab results. Was told they are not back yet.

## 2016-09-22 NOTE — Telephone Encounter (Signed)
Patient aware.

## 2016-09-23 LAB — HEPATITIS PANEL, ACUTE
HEP A IGM: UNDETERMINED — AB
Hep B C IgM: NEGATIVE
Hepatitis B Surface Ag: NEGATIVE

## 2016-09-23 LAB — HEPATIC FUNCTION PANEL
ALT: 64 IU/L — ABNORMAL HIGH (ref 0–32)
AST: 40 IU/L (ref 0–40)
Albumin: 3.7 g/dL (ref 3.5–4.7)
Alkaline Phosphatase: 69 IU/L (ref 39–117)
BILIRUBIN TOTAL: 0.3 mg/dL (ref 0.0–1.2)
BILIRUBIN, DIRECT: 0.09 mg/dL (ref 0.00–0.40)
TOTAL PROTEIN: 5.7 g/dL — AB (ref 6.0–8.5)

## 2016-09-23 LAB — HEPATITIS E VIRUS (HEV) IGG: HEV IgG: NEGATIVE

## 2016-09-24 ENCOUNTER — Telehealth: Payer: Self-pay | Admitting: *Deleted

## 2016-09-24 ENCOUNTER — Other Ambulatory Visit: Payer: Self-pay | Admitting: *Deleted

## 2016-09-24 DIAGNOSIS — R945 Abnormal results of liver function studies: Principal | ICD-10-CM

## 2016-09-24 DIAGNOSIS — R7989 Other specified abnormal findings of blood chemistry: Secondary | ICD-10-CM

## 2016-09-24 NOTE — Telephone Encounter (Signed)
-----   Message from Claretta Fraise, MD sent at 09/24/2016 12:21 PM EDT ----- Hepatitis tests were inconclusive. Repeat Hep A, B IgG & IgM. Do Hep C antigen

## 2016-09-24 NOTE — Telephone Encounter (Signed)
Pt notified needs additional labs Will come in for labwork Orders entered in epic

## 2016-09-27 ENCOUNTER — Other Ambulatory Visit (INDEPENDENT_AMBULATORY_CARE_PROVIDER_SITE_OTHER): Payer: Medicare Other

## 2016-09-27 ENCOUNTER — Ambulatory Visit (INDEPENDENT_AMBULATORY_CARE_PROVIDER_SITE_OTHER): Payer: Medicare Other

## 2016-09-27 DIAGNOSIS — R7989 Other specified abnormal findings of blood chemistry: Secondary | ICD-10-CM

## 2016-09-27 DIAGNOSIS — Z23 Encounter for immunization: Secondary | ICD-10-CM

## 2016-09-27 DIAGNOSIS — R945 Abnormal results of liver function studies: Principal | ICD-10-CM

## 2016-09-27 NOTE — Progress Notes (Signed)
Pt given influenza and Prevnar vaccine Pt tolerated well

## 2016-09-28 LAB — HEPATITIS C ANTIBODY: Hep C Virus Ab: <0.1 s/co ratio (ref 0.0–0.9)

## 2016-09-28 LAB — HEPATIC FUNCTION PANEL
ALBUMIN: 3.9 g/dL (ref 3.5–4.7)
ALK PHOS: 81 IU/L (ref 39–117)
ALT: 59 IU/L — ABNORMAL HIGH (ref 0–32)
AST: 50 IU/L — AB (ref 0–40)
BILIRUBIN TOTAL: 0.3 mg/dL (ref 0.0–1.2)
Bilirubin, Direct: 0.1 mg/dL (ref 0.00–0.40)
Total Protein: 6.3 g/dL (ref 6.0–8.5)

## 2016-09-29 ENCOUNTER — Telehealth: Payer: Self-pay | Admitting: Family Medicine

## 2016-09-29 ENCOUNTER — Ambulatory Visit (HOSPITAL_COMMUNITY)
Admission: RE | Admit: 2016-09-29 | Discharge: 2016-09-29 | Disposition: A | Discharge status: Home / Self Care | Payer: Medicare Other | Source: Ambulatory Visit | Attending: Family Medicine | Admitting: Family Medicine

## 2016-09-29 DIAGNOSIS — N261 Atrophy of kidney (terminal): Secondary | ICD-10-CM | POA: Diagnosis not present

## 2016-09-29 DIAGNOSIS — R748 Abnormal levels of other serum enzymes: Secondary | ICD-10-CM | POA: Insufficient documentation

## 2016-09-29 DIAGNOSIS — K838 Other specified diseases of biliary tract: Secondary | ICD-10-CM | POA: Insufficient documentation

## 2016-09-29 DIAGNOSIS — Z9049 Acquired absence of other specified parts of digestive tract: Secondary | ICD-10-CM | POA: Diagnosis not present

## 2016-09-29 DIAGNOSIS — R945 Abnormal results of liver function studies: Secondary | ICD-10-CM | POA: Diagnosis not present

## 2016-09-30 ENCOUNTER — Ambulatory Visit: Payer: Medicare Other

## 2016-09-30 ENCOUNTER — Other Ambulatory Visit: Payer: Self-pay | Admitting: Obstetrics and Gynecology

## 2016-09-30 DIAGNOSIS — R921 Mammographic calcification found on diagnostic imaging of breast: Secondary | ICD-10-CM

## 2016-09-30 NOTE — Telephone Encounter (Signed)
No eliquis usually does not cause liver problems Done see anything on you med list that jumps out at me for causing liver problems.

## 2016-09-30 NOTE — Telephone Encounter (Signed)
Pt aware of providers response.

## 2016-10-01 ENCOUNTER — Encounter: Payer: Self-pay | Admitting: Internal Medicine

## 2016-10-01 ENCOUNTER — Encounter (INDEPENDENT_AMBULATORY_CARE_PROVIDER_SITE_OTHER): Payer: Self-pay

## 2016-10-01 ENCOUNTER — Ambulatory Visit (INDEPENDENT_AMBULATORY_CARE_PROVIDER_SITE_OTHER): Payer: Medicare Other | Admitting: Internal Medicine

## 2016-10-01 VITALS — BP 130/72 | HR 52 | Ht 61.0 in | Wt 147.0 lb

## 2016-10-01 DIAGNOSIS — I2510 Atherosclerotic heart disease of native coronary artery without angina pectoris: Secondary | ICD-10-CM | POA: Diagnosis not present

## 2016-10-01 DIAGNOSIS — K3184 Gastroparesis: Secondary | ICD-10-CM

## 2016-10-01 DIAGNOSIS — K224 Dyskinesia of esophagus: Secondary | ICD-10-CM | POA: Diagnosis not present

## 2016-10-01 NOTE — Patient Instructions (Signed)
   Stay on the Step 3 diet and try to eat your supper earlier to give more time between supper and bedtime.     I appreciate the opportunity to care for you. Silvano Rusk, MD, [facility]

## 2016-10-01 NOTE — Progress Notes (Signed)
   Erica [name] 82 y.o. 09-07-1930 [id number]  Assessment & Plan:   Encounter Diagnoses  Name Primary?  . Gastroparesis Yes  . Esophageal dysmotility     I reviewed her ba swallow and gastric emptying scans from 2016 and prior EGD with her - esophageal dysmotility and delayed gastric emptying. Intolerant of metaclopramide and bethanechol and erythromycin no help. On PPI QTc > 4.7 so domperidone not an option.  Try to eat supper earlier to allow for more time between meal and bedtime and possibly reduce chance of regurgitation Step 3 gastroparesisdiet F/u prn Nothing else to offer I think I can see her back to reassess if things change  25 minutes time spent with patient > half in counseling coordination of care  [id number] Hassell Done, [name]  Subjective:   Chief Complaint: regurgitation, nausea  HPI Still having intermittent nausea and regurgitation of food and pills when lies down at night. Eats 5 PM, soft diet and some meat - no sig roughage. Some coughing in AM, no pneumonia or bronchitis. She had constipation and 2 scanty mucoid bleeding episodes - bright red blood per rectum - saw GI in [facility] at rec of her daughter- [facility] advised. I reviewed that note. Wants to follow with me No bleeding or sig constipation since. Also had transient transaminitis that is almost resolved - [place] and hepatitis serologies negative.  Medications, allergies, past medical history, past surgical history, family history and social history are reviewed and updated in the EMR.  Review of Systems Still grieving her son's death 1/2 xanax allows good sleep  Objective:   Physical Exam BP 130/72   Pulse (!) 52   Ht 5\' 1"  (1.549 m)   Wt 147 lb (66.7 kg)   BMI 27.78 kg/m  NAD   Wt Readings from Last 3 Encounters:  10/01/16 147 lb (66.7 kg)  09/01/16 143 lb 4 oz (65 kg)  08/25/16 144 lb (65.3 kg)

## 2016-10-05 ENCOUNTER — Ambulatory Visit (INDEPENDENT_AMBULATORY_CARE_PROVIDER_SITE_OTHER): Payer: Medicare Other | Admitting: Neurology

## 2016-10-05 ENCOUNTER — Encounter: Payer: Self-pay | Admitting: Neurology

## 2016-10-05 ENCOUNTER — Other Ambulatory Visit (INDEPENDENT_AMBULATORY_CARE_PROVIDER_SITE_OTHER): Payer: Medicare Other

## 2016-10-05 ENCOUNTER — Ambulatory Visit (INDEPENDENT_AMBULATORY_CARE_PROVIDER_SITE_OTHER): Payer: Self-pay | Admitting: Neurology

## 2016-10-05 DIAGNOSIS — R269 Unspecified abnormalities of gait and mobility: Secondary | ICD-10-CM

## 2016-10-05 DIAGNOSIS — R899 Unspecified abnormal finding in specimens from other organs, systems and tissues: Secondary | ICD-10-CM | POA: Diagnosis not present

## 2016-10-05 DIAGNOSIS — R2689 Other abnormalities of gait and mobility: Secondary | ICD-10-CM

## 2016-10-05 DIAGNOSIS — M21372 Foot drop, left foot: Secondary | ICD-10-CM | POA: Diagnosis not present

## 2016-10-05 DIAGNOSIS — R945 Abnormal results of liver function studies: Secondary | ICD-10-CM | POA: Diagnosis not present

## 2016-10-05 NOTE — Progress Notes (Signed)
The patient comes in for EMG and nerve conduction studies today, no evidence of a neuropathy is seen, source of the left foot drop was not well delineated. The patient will be set up for physical therapy for gait training, evaluation for possible AFO on the left.  She will follow-up in 4 months.

## 2016-10-05 NOTE — Procedures (Signed)
     HISTORY:   Erica [name] is an 82 year old patient with a history of a left footdrop, some discomfort down the left leg into the calf. The patient is being evaluated for a problem with walking.  NERVE CONDUCTION STUDIES:  Nerve conduction studies were performed on both lower extremities. The distal motor latencies for the peroneal and posterior tibial nerves were within normal limits bilaterally with low motor amplitudes for the peroneal nerves bilaterally, normal for the posterior tibial nerves bilaterally. The nerve conduction velocities or the peroneal and posterior tibial nerves were normal bilaterally. The H reflex latencies were symmetric and normal, and the peroneal sensory latencies were symmetric and normal.  EMG STUDIES:  EMG study was performed on the left lower extremity:  The tibialis anterior muscle reveals 2 to 4K motor units with slightly reduced recruitment. No fibrillations or positive waves were seen. The peroneus tertius muscle reveals 2 to 4K motor units with full recruitment. No fibrillations or positive waves were seen. The medial gastrocnemius muscle reveals 1 to 3K motor units with full recruitment. No fibrillations or positive waves were seen. The vastus lateralis muscle reveals 2 to 4K motor units with full recruitment. No fibrillations or positive waves were seen. The iliopsoas muscle reveals 2 to 4K motor units with full recruitment. No fibrillations or positive waves were seen. The biceps femoris muscle (long head) reveals 2 to 4K motor units with full recruitment. No fibrillations or positive waves were seen. The lumbosacral paraspinal muscles were tested at 3 levels, and revealed no abnormalities of insertional activity at all 3 levels tested. There was good relaxation.  EMG study was performed on the right lower extremity:  The tibialis anterior muscle reveals 2 to 4K motor units with full recruitment. No fibrillations or positive waves were seen. The  peroneus tertius muscle reveals 2 to 4K motor units with full recruitment. No fibrillations or positive waves were seen. The medial gastrocnemius muscle reveals 1 to 3K motor units with full recruitment. No fibrillations or positive waves were seen. The vastus lateralis muscle reveals 2 to 4K motor units with full recruitment. No fibrillations or positive waves were seen. The iliopsoas muscle reveals 2 to 4K motor units with full recruitment. No fibrillations or positive waves were seen. The biceps femoris muscle (long head) reveals 2 to 4K motor units with full recruitment. No fibrillations or positive waves were seen. The lumbosacral paraspinal muscles were tested at 3 levels, and revealed no abnormalities of insertional activity at all 3 levels tested. There was good relaxation.   IMPRESSION:  Nerve conduction studies done on both lower extremities shows no evidence of a peripheral neuropathy, some lowering of motor amplitudes for the peroneal nerves were seen bilaterally. EMG evaluations on both lower extremities were relatively unremarkable, there is no evidence of an overlying L5 radiculopathy on either side. No evidence of a peroneal neuropathy was confirmed on either side. The source of the left foot drop was not well delineated on this evaluation.  Jill Alexanders MD 10/05/2016 4:49 PM  [facility] [address] [place],  [address]  Phone [phone] Fax [phone]

## 2016-10-05 NOTE — Progress Notes (Signed)
Please refer to EMG and nerve conduction study procedure note. 

## 2016-10-06 LAB — HEPATITIS PANEL, ACUTE
HEP B C IGM: NEGATIVE
Hep A IgM: UNDETERMINED — AB
Hepatitis B Surface Ag: NEGATIVE

## 2016-10-07 ENCOUNTER — Telehealth: Payer: Self-pay | Admitting: Nurse Practitioner

## 2016-10-07 NOTE — Telephone Encounter (Signed)
Aware. 

## 2016-10-21 ENCOUNTER — Encounter: Payer: Self-pay | Admitting: Nurse Practitioner

## 2016-10-21 ENCOUNTER — Ambulatory Visit (INDEPENDENT_AMBULATORY_CARE_PROVIDER_SITE_OTHER): Payer: Medicare Other | Admitting: Nurse Practitioner

## 2016-10-21 ENCOUNTER — Other Ambulatory Visit: Payer: Self-pay | Admitting: Nurse Practitioner

## 2016-10-21 VITALS — BP 126/70 | HR 55 | Temp 98.2°F | Ht 61.0 in | Wt 145.0 lb

## 2016-10-21 DIAGNOSIS — E782 Mixed hyperlipidemia: Secondary | ICD-10-CM | POA: Diagnosis not present

## 2016-10-21 DIAGNOSIS — I2510 Atherosclerotic heart disease of native coronary artery without angina pectoris: Secondary | ICD-10-CM

## 2016-10-21 DIAGNOSIS — K219 Gastro-esophageal reflux disease without esophagitis: Secondary | ICD-10-CM | POA: Diagnosis not present

## 2016-10-21 DIAGNOSIS — I10 Essential (primary) hypertension: Secondary | ICD-10-CM | POA: Diagnosis not present

## 2016-10-21 DIAGNOSIS — K3184 Gastroparesis: Secondary | ICD-10-CM | POA: Diagnosis not present

## 2016-10-21 DIAGNOSIS — E039 Hypothyroidism, unspecified: Secondary | ICD-10-CM | POA: Diagnosis not present

## 2016-10-21 DIAGNOSIS — Z6827 Body mass index (BMI) 27.0-27.9, adult: Secondary | ICD-10-CM

## 2016-10-21 DIAGNOSIS — F3341 Major depressive disorder, recurrent, in partial remission: Secondary | ICD-10-CM | POA: Diagnosis not present

## 2016-10-21 MED ORDER — APIXABAN 2.5 MG PO TABS: 2.5000 mg | ORAL_TABLET | Freq: Two times a day (BID) | ORAL | 3 refills | Status: DC

## 2016-10-21 NOTE — Addendum Note (Signed)
Addended by: [name] on: 10/21/2016 03:35 PM   Modules accepted: Orders

## 2016-10-21 NOTE — Patient Instructions (Signed)
Fall Prevention in the Home  Falls can cause injuries and can affect people from all age groups. There are many simple things that you can do to make your home safe and to help prevent falls. WHAT CAN I DO ON THE OUTSIDE OF MY HOME?  Regularly repair the edges of walkways and driveways and fix any cracks.  Remove high doorway thresholds.  Trim any shrubbery on the main path into your home.  Use bright outdoor lighting.  Clear walkways of debris and clutter, including tools and rocks.  Regularly check that handrails are securely fastened and in good repair. Both sides of any steps should have handrails.  Install guardrails along the edges of any raised decks or porches.  Have leaves, snow, and ice cleared regularly.  Use sand or salt on walkways during winter months.  In the garage, clean up any spills right away, including grease or oil spills. WHAT CAN I DO IN THE BATHROOM?  Use night lights.  Install grab bars by the toilet and in the tub and shower. Do not use towel bars as grab bars.  Use non-skid mats or decals on the floor of the tub or shower.  If you need to sit down while you are in the shower, use a plastic, non-slip stool..  Keep the floor dry. Immediately clean up any water that spills on the floor.  Remove soap buildup in the tub or shower on a regular basis.  Attach bath mats securely with double-sided non-slip rug tape.  Remove throw rugs and other tripping hazards from the floor. WHAT CAN I DO IN THE BEDROOM?  Use night lights.  Make sure that a bedside light is easy to reach.  Do not use oversized bedding that drapes onto the floor.  Have a firm chair that has side arms to use for getting dressed.  Remove throw rugs and other tripping hazards from the floor. WHAT CAN I DO IN THE KITCHEN?   Clean up any spills right away.  Avoid walking on wet floors.  Place frequently used items in easy-to-reach places.  If you need to reach for something  above you, use a sturdy step stool that has a grab bar.  Keep electrical cables out of the way.  Do not use floor polish or wax that makes floors slippery. If you have to use wax, make sure that it is non-skid floor wax.  Remove throw rugs and other tripping hazards from the floor. WHAT CAN I DO IN THE STAIRWAYS?  Do not leave any items on the stairs.  Make sure that there are handrails on both sides of the stairs. Fix handrails that are broken or loose. Make sure that handrails are as long as the stairways.  Check any carpeting to make sure that it is firmly attached to the stairs. Fix any carpet that is loose or worn.  Avoid having throw rugs at the top or bottom of stairways, or secure the rugs with carpet tape to prevent them from moving.  Make sure that you have a light switch at the top of the stairs and the bottom of the stairs. If you do not have them, have them installed. WHAT ARE SOME OTHER FALL PREVENTION TIPS?  Wear closed-toe shoes that fit well and support your feet. Wear shoes that have rubber soles or low heels.  When you use a stepladder, make sure that it is completely opened and that the sides are firmly locked. Have someone hold the ladder while you   are using it. Do not climb a closed stepladder.  Add color or contrast paint or tape to grab bars and handrails in your home. Place contrasting color strips on the first and last steps.  Use mobility aids as needed, such as canes, walkers, scooters, and crutches.  Turn on lights if it is dark. Replace any light bulbs that burn out.  Set up furniture so that there are clear paths. Keep the furniture in the same spot.  Fix any uneven floor surfaces.  Choose a carpet design that does not hide the edge of steps of a stairway.  Be aware of any and all pets.  Review your medicines with your healthcare provider. Some medicines can cause dizziness or changes in blood pressure, which increase your risk of falling. Talk  with your health care provider about other ways that you can decrease your risk of falls. This may include working with a physical therapist or trainer to improve your strength, balance, and endurance.   This information is not intended to replace advice given to you by your health care provider. Make sure you discuss any questions you have with your health care provider.   Document Released: 12/03/2002 Document Revised: 04/29/2015 Document Reviewed: 01/17/2015 [employer] Interactive Patient Education 2016 [employer]  

## 2016-10-21 NOTE — Progress Notes (Signed)
Subjective:    Patient ID: Erica [name], female    DOB: Nov 21, 1930, 82 y.o.   MRN: [id number]   Patient here today for follow up of chronic medical problems.  Outpatient Encounter Prescriptions as of 10/21/2016  Medication Sig  . ALPRAZolam (XANAX) 0.5 MG tablet Take 0.5 mg by mouth at bedtime as needed for anxiety.  Marland Kitchen amiodarone (PACERONE) 200 MG tablet TAKE 1 TABLET DAILY  . amLODipine (NORVASC) 5 MG tablet Take 0.5 tablets (2.5 mg total) by mouth daily. TAKE 1 TABLET (5 MG TOTAL) BY MOUTH DAILY.  Marland Kitchen Cholecalciferol (VITAMIN D PO) Take 1 tablet by mouth daily.  Marland Kitchen ELIQUIS 2.5 MG TABS tablet Take 1 tablet (2.5 mg total) by mouth 2 (two) times daily.  Marland Kitchen escitalopram (LEXAPRO) 20 MG tablet Take 2 tablets (40 mg total) by mouth daily.  Marland Kitchen EVENING PRIMROSE OIL PO Take 1 capsule by mouth daily.  . furosemide (LASIX) 20 MG tablet Take 1 tablet (20 mg total) by mouth daily as needed for fluid or edema.  Marland Kitchen levothyroxine (SYNTHROID, LEVOTHROID) 88 MCG tablet Take 1 tablet (88 mcg total) by mouth daily before breakfast.  . Melatonin 5 MG CAPS Take 5 mg by mouth at bedtime as needed (sleep).  . Multiple Vitamin (MULTIVITAMIN WITH MINERALS) TABS tablet Take 1 tablet by mouth daily.  Marland Kitchen olmesartan (BENICAR) 40 MG tablet Take 1 tablet (40 mg total) by mouth daily.  . Omega-3 Fatty Acids (FISH OIL PO) Take 1 tablet by mouth daily.  Marland Kitchen omeprazole-sodium bicarbonate (ZEGERID) 40-1100 MG capsule TAKE 1 CAPSULE BY MOUTH TWICE A DAY BEFORE BREAKFAST AND AT BEDTIME  . spironolactone (ALDACTONE) 25 MG tablet 12.5 mg.    * SHe was seen by Dr.Stacks with gastroparesis and at that time her liver enzymes were elevated- she was sent to DR. Carlean Purl. He encouraged her to allow more time for her ffod to digest at night prior to going to bed- he is to follow up with her on as needed basis.  Hypertension  This is a chronic problem. The current episode started more than 1 year ago. The problem is unchanged. The problem  is controlled. Risk factors for coronary artery disease include dyslipidemia, obesity, post-menopausal state and smoking/tobacco exposure. Past treatments include calcium channel blockers and angiotensin blockers. The current treatment provides mild improvement. Compliance problems include diet and exercise.  Hypertensive end-organ damage includes a thyroid problem. There is no history of CAD/MI or CVA.  Hyperlipidemia  This is a chronic problem. The current episode started more than 1 year ago. The problem is controlled. Recent lipid tests were reviewed and are normal. Exacerbating diseases include obesity. She has no history of diabetes or hypothyroidism. The current treatment provides moderate improvement of lipids. Compliance problems include adherence to diet and adherence to exercise.  Risk factors for coronary artery disease include dyslipidemia, hypertension, obesity and post-menopausal.  Thyroid Problem  Presents for follow-up (hypothyroidism) visit. The symptoms have been stable. Her past medical history is significant for hyperlipidemia. There is no history of diabetes.  GERD/dysphagia zegrid- works well for her- depression  lexapro working well to keep her symptoms under control- keeps her from worrying so much. She is still grieving some over the death of her son but is doing some better. GAD She is still grieving the loss of her son several months ago- she says she still cries everyday. CAD Had heart cath and was 30% blocked- was started on eliquis and is doing quite well- no  C/o abnormal bleeding or bruising.   Review of Systems  Constitutional: Negative.   HENT: Negative.   Respiratory: Negative.   Cardiovascular: Negative.   Gastrointestinal: Negative.   Genitourinary: Negative.   Neurological: Negative.   Psychiatric/Behavioral: Negative.   All other systems reviewed and are negative.      Objective:   Physical Exam  Constitutional: She is oriented to person, place,  and time. She appears well-developed and well-nourished.  HENT:  Nose: Nose normal.  Mouth/Throat: Oropharynx is clear and moist.  Eyes: EOM are normal.  Neck: Trachea normal, normal range of motion and full passive range of motion without pain. Neck supple. No JVD present. Carotid bruit is not present. No thyromegaly present.  Cardiovascular: Normal rate, regular rhythm, normal heart sounds and intact distal pulses.  Exam reveals no gallop and no friction rub.   No murmur heard. Pulmonary/Chest: Effort normal and breath sounds normal.  Abdominal: Soft. Bowel sounds are normal. She exhibits no distension and no mass. There is no tenderness.  Musculoskeletal: Normal range of motion.  Lymphadenopathy:    She has no cervical adenopathy.  Neurological: She is alert and oriented to person, place, and time. She has normal reflexes.  Skin: Skin is warm and dry.  Psychiatric: She has a normal mood and affect. Her behavior is normal. Judgment and thought content normal.   BP 126/70   Pulse (!) 55   Temp 98.2 F (36.8 C) (Oral)   Ht 5\' 1"  (1.549 m)   Wt 145 lb (65.8 kg)   BMI 27.40 kg/m        Assessment & Plan:  1. Essential hypertension Low sodium diet  2. Gastroparesis Eat slowly and wait at least 2 hours before  Laying down after eating  3. Gastroesophageal reflux disease without esophagitis Avoid spicy foods Do not eat 2 hours prior to bedtime  4. Hypothyroidism, unspecified type  5. BMI 27.0-27.9,adult Discussed diet and exercise for person with BMI >25 Will recheck weight in 3-6 months  6. Recurrent major depressive disorder, in partial remission ([place]) Stress management  7. Mixed hyperlipidemia Low fat diet  8. Coronary artery disease involving native coronary artery of native heart without angina pectoris Keep follow up appointment with cardiology - apixaban (ELIQUIS) 2.5 MG TABS tablet; Take 1 tablet (2.5 mg total) by mouth 2 (two) times daily.  Dispense: 60  tablet; Refill: 3    Labs pending Health maintenance reviewed Diet and exercise encouraged Continue all meds Follow up  In 3 months   [place], [name]

## 2016-10-22 LAB — CMP14+EGFR
ALBUMIN: 4 g/dL (ref 3.5–4.7)
ALT: 61 IU/L — ABNORMAL HIGH (ref 0–32)
AST: 48 IU/L — ABNORMAL HIGH (ref 0–40)
Albumin/Globulin Ratio: 1.7 (ref 1.2–2.2)
Alkaline Phosphatase: 82 IU/L (ref 39–117)
BUN / CREAT RATIO: 19 (ref 12–28)
BUN: 19 mg/dL (ref 8–27)
Bilirubin Total: 0.3 mg/dL (ref 0.0–1.2)
CALCIUM: 9.7 mg/dL (ref 8.7–10.3)
CO2: 23 mmol/L (ref 18–29)
CREATININE: 1.01 mg/dL — AB (ref 0.57–1.00)
Chloride: 98 mmol/L (ref 96–106)
GFR calc Af Amer: 59 mL/min/{1.73_m2} — ABNORMAL LOW (ref >59)
GFR, EST NON AFRICAN AMERICAN: 51 mL/min/{1.73_m2} — AB (ref >59)
GLOBULIN, TOTAL: 2.4 g/dL (ref 1.5–4.5)
Glucose: 103 mg/dL — ABNORMAL HIGH (ref 65–99)
Potassium: 4.6 mmol/L (ref 3.5–5.2)
SODIUM: 139 mmol/L (ref 134–144)
Total Protein: 6.4 g/dL (ref 6.0–8.5)

## 2016-10-22 LAB — LIPID PANEL
CHOL/HDL RATIO: 4.1 ratio (ref 0.0–4.4)
Cholesterol, Total: 204 mg/dL — ABNORMAL HIGH (ref 100–199)
HDL: 50 mg/dL (ref >39)
LDL CALC: 123 mg/dL — AB (ref 0–99)
Triglycerides: 153 mg/dL — ABNORMAL HIGH (ref 0–149)
VLDL Cholesterol Cal: 31 mg/dL (ref 5–40)

## 2016-11-24 DIAGNOSIS — I480 Paroxysmal atrial fibrillation: Secondary | ICD-10-CM | POA: Diagnosis not present

## 2016-11-24 DIAGNOSIS — I10 Essential (primary) hypertension: Secondary | ICD-10-CM | POA: Diagnosis not present

## 2016-11-24 DIAGNOSIS — E785 Hyperlipidemia, unspecified: Secondary | ICD-10-CM | POA: Diagnosis not present

## 2016-11-24 DIAGNOSIS — F419 Anxiety disorder, unspecified: Secondary | ICD-10-CM | POA: Diagnosis not present

## 2016-11-24 DIAGNOSIS — M1990 Unspecified osteoarthritis, unspecified site: Secondary | ICD-10-CM | POA: Diagnosis not present

## 2016-11-24 DIAGNOSIS — E039 Hypothyroidism, unspecified: Secondary | ICD-10-CM | POA: Diagnosis not present

## 2016-11-24 DIAGNOSIS — I2510 Atherosclerotic heart disease of native coronary artery without angina pectoris: Secondary | ICD-10-CM | POA: Diagnosis not present

## 2016-11-30 ENCOUNTER — Ambulatory Visit
Admission: RE | Admit: 2016-11-30 | Discharge: 2016-11-30 | Disposition: A | Discharge status: Home / Self Care | Payer: Medicare Other | Source: Ambulatory Visit | Attending: Obstetrics and Gynecology | Admitting: Obstetrics and Gynecology

## 2016-11-30 ENCOUNTER — Other Ambulatory Visit: Payer: Self-pay | Admitting: Obstetrics and Gynecology

## 2016-11-30 DIAGNOSIS — R921 Mammographic calcification found on diagnostic imaging of breast: Secondary | ICD-10-CM

## 2016-11-30 DIAGNOSIS — L904 Acrodermatitis chronica atrophicans: Secondary | ICD-10-CM | POA: Diagnosis not present

## 2016-11-30 DIAGNOSIS — Z01419 Encounter for gynecological examination (general) (routine) without abnormal findings: Secondary | ICD-10-CM | POA: Diagnosis not present

## 2016-11-30 DIAGNOSIS — M79662 Pain in left lower leg: Secondary | ICD-10-CM | POA: Diagnosis not present

## 2016-11-30 DIAGNOSIS — Z6827 Body mass index (BMI) 27.0-27.9, adult: Secondary | ICD-10-CM | POA: Diagnosis not present

## 2017-01-21 ENCOUNTER — Ambulatory Visit: Payer: Medicare Other | Admitting: Nurse Practitioner

## 2017-01-25 ENCOUNTER — Ambulatory Visit (INDEPENDENT_AMBULATORY_CARE_PROVIDER_SITE_OTHER): Payer: Medicare Other | Admitting: Nurse Practitioner

## 2017-01-25 ENCOUNTER — Encounter: Payer: Self-pay | Admitting: Nurse Practitioner

## 2017-01-25 VITALS — BP 139/63 | HR 53 | Temp 97.2°F | Ht 61.0 in | Wt 146.0 lb

## 2017-01-25 DIAGNOSIS — I10 Essential (primary) hypertension: Secondary | ICD-10-CM

## 2017-01-25 DIAGNOSIS — K219 Gastro-esophageal reflux disease without esophagitis: Secondary | ICD-10-CM

## 2017-01-25 DIAGNOSIS — E039 Hypothyroidism, unspecified: Secondary | ICD-10-CM

## 2017-01-25 DIAGNOSIS — I2510 Atherosclerotic heart disease of native coronary artery without angina pectoris: Secondary | ICD-10-CM | POA: Diagnosis not present

## 2017-01-25 DIAGNOSIS — F3341 Major depressive disorder, recurrent, in partial remission: Secondary | ICD-10-CM | POA: Diagnosis not present

## 2017-01-25 DIAGNOSIS — M21372 Foot drop, left foot: Secondary | ICD-10-CM

## 2017-01-25 DIAGNOSIS — K3184 Gastroparesis: Secondary | ICD-10-CM

## 2017-01-25 DIAGNOSIS — Z6827 Body mass index (BMI) 27.0-27.9, adult: Secondary | ICD-10-CM

## 2017-01-25 DIAGNOSIS — F3342 Major depressive disorder, recurrent, in full remission: Secondary | ICD-10-CM | POA: Diagnosis not present

## 2017-01-25 DIAGNOSIS — E782 Mixed hyperlipidemia: Secondary | ICD-10-CM

## 2017-01-25 MED ORDER — LEVOTHYROXINE SODIUM 88 MCG PO TABS: 88.0000 ug | ORAL_TABLET | Freq: Every day | ORAL | 1 refills | Status: DC

## 2017-01-25 MED ORDER — ESCITALOPRAM OXALATE 20 MG PO TABS: 40.0000 mg | ORAL_TABLET | Freq: Every day | ORAL | 1 refills | Status: DC

## 2017-01-25 MED ORDER — OLMESARTAN MEDOXOMIL 40 MG PO TABS: 40.0000 mg | ORAL_TABLET | Freq: Every day | ORAL | 1 refills | Status: DC

## 2017-01-25 MED ORDER — AMIODARONE HCL 200 MG PO TABS: 200.0000 mg | ORAL_TABLET | Freq: Every day | ORAL | 1 refills | Status: DC

## 2017-01-25 MED ORDER — OMEPRAZOLE-SODIUM BICARBONATE 40-1100 MG PO CAPS: ORAL_CAPSULE | ORAL | 1 refills | Status: DC

## 2017-01-25 MED ORDER — APIXABAN 2.5 MG PO TABS: 2.5000 mg | ORAL_TABLET | Freq: Two times a day (BID) | ORAL | 3 refills | Status: DC

## 2017-01-25 MED ORDER — FUROSEMIDE 20 MG PO TABS: 20.0000 mg | ORAL_TABLET | Freq: Every day | ORAL | 1 refills | Status: DC | PRN

## 2017-01-25 MED ORDER — AMLODIPINE BESYLATE 5 MG PO TABS: 2.5000 mg | ORAL_TABLET | Freq: Every day | ORAL | 1 refills | Status: DC

## 2017-01-25 NOTE — Patient Instructions (Signed)
Fall Prevention in the Home Introduction Falls can cause injuries. They can happen to people of all ages. There are many things you can do to make your home safe and to help prevent falls. What can I do on the outside of my home?  Regularly fix the edges of walkways and driveways and fix any cracks.  Remove anything that might make you trip as you walk through a door, such as a raised step or threshold.  Trim any bushes or trees on the path to your home.  Use bright outdoor lighting.  Clear any walking paths of anything that might make someone trip, such as rocks or tools.  Regularly check to see if handrails are loose or broken. Make sure that both sides of any steps have handrails.  Any raised decks and porches should have guardrails on the edges.  Have any leaves, snow, or ice cleared regularly.  Use sand or salt on walking paths during winter.  Clean up any spills in your garage right away. This includes oil or grease spills. What can I do in the bathroom?  Use night lights.  Install grab bars by the toilet and in the tub and shower. Do not use towel bars as grab bars.  Use non-skid mats or decals in the tub or shower.  If you need to sit down in the shower, use a plastic, non-slip stool.  Keep the floor dry. Clean up any water that spills on the floor as soon as it happens.  Remove soap buildup in the tub or shower regularly.  Attach bath mats securely with double-sided non-slip rug tape.  Do not have throw rugs and other things on the floor that can make you trip. What can I do in the bedroom?  Use night lights.  Make sure that you have a light by your bed that is easy to reach.  Do not use any sheets or blankets that are too big for your bed. They should not hang down onto the floor.  Have a firm chair that has side arms. You can use this for support while you get dressed.  Do not have throw rugs and other things on the floor that can make you trip. What can  I do in the kitchen?  Clean up any spills right away.  Avoid walking on wet floors.  Keep items that you use a lot in easy-to-reach places.  If you need to reach something above you, use a strong step stool that has a grab bar.  Keep electrical cords out of the way.  Do not use floor polish or wax that makes floors slippery. If you must use wax, use non-skid floor wax.  Do not have throw rugs and other things on the floor that can make you trip. What can I do with my stairs?  Do not leave any items on the stairs.  Make sure that there are handrails on both sides of the stairs and use them. Fix handrails that are broken or loose. Make sure that handrails are as long as the stairways.  Check any carpeting to make sure that it is firmly attached to the stairs. Fix any carpet that is loose or worn.  Avoid having throw rugs at the top or bottom of the stairs. If you do have throw rugs, attach them to the floor with carpet tape.  Make sure that you have a light switch at the top of the stairs and the bottom of the stairs. If you   do not have them, ask someone to add them for you. What else can I do to help prevent falls?  Wear shoes that:  Do not have high heels.  Have rubber bottoms.  Are comfortable and fit you well.  Are closed at the toe. Do not wear sandals.  If you use a stepladder:  Make sure that it is fully opened. Do not climb a closed stepladder.  Make sure that both sides of the stepladder are locked into place.  Ask someone to hold it for you, if possible.  Clearly mark and make sure that you can see:  Any grab bars or handrails.  First and last steps.  Where the edge of each step is.  Use tools that help you move around (mobility aids) if they are needed. These include:  Canes.  Walkers.  Scooters.  Crutches.  Turn on the lights when you go into a dark area. Replace any light bulbs as soon as they burn out.  Set up your furniture so you have a  clear path. Avoid moving your furniture around.  If any of your floors are uneven, fix them.  If there are any pets around you, be aware of where they are.  Review your medicines with your doctor. Some medicines can make you feel dizzy. This can increase your chance of falling. Ask your doctor what other things that you can do to help prevent falls. This information is not intended to replace advice given to you by your health care provider. Make sure you discuss any questions you have with your health care provider. Document Released: 10/09/2009 Document Revised: 05/20/2016 Document Reviewed: 01/17/2015  2017 [employer]vier  

## 2017-01-25 NOTE — Progress Notes (Signed)
Subjective:    Patient ID: Erica [name], female    DOB: 1930-12-04, 83 y.o.   MRN: [id number]   Patient here today for follow up of chronic medical problems. No changes since last visit. No complaint today.  Outpatient Encounter Prescriptions as of 10/21/2016  Medication Sig  . ALPRAZolam (XANAX) 0.5 MG tablet Take 0.5 mg by mouth at bedtime as needed for anxiety.  Marland Kitchen amiodarone (PACERONE) 200 MG tablet TAKE 1 TABLET DAILY  . amLODipine (NORVASC) 5 MG tablet Take 0.5 tablets (2.5 mg total) by mouth daily. TAKE 1 TABLET (5 MG TOTAL) BY MOUTH DAILY.  Marland Kitchen Cholecalciferol (VITAMIN D PO) Take 1 tablet by mouth daily.  Marland Kitchen ELIQUIS 2.5 MG TABS tablet Take 1 tablet (2.5 mg total) by mouth 2 (two) times daily.  Marland Kitchen escitalopram (LEXAPRO) 20 MG tablet Take 2 tablets (40 mg total) by mouth daily.  Marland Kitchen EVENING PRIMROSE OIL PO Take 1 capsule by mouth daily.  . furosemide (LASIX) 20 MG tablet Take 1 tablet (20 mg total) by mouth daily as needed for fluid or edema.  Marland Kitchen levothyroxine (SYNTHROID, LEVOTHROID) 88 MCG tablet Take 1 tablet (88 mcg total) by mouth daily before breakfast.  . Melatonin 5 MG CAPS Take 5 mg by mouth at bedtime as needed (sleep).  . Multiple Vitamin (MULTIVITAMIN WITH MINERALS) TABS tablet Take 1 tablet by mouth daily.  Marland Kitchen olmesartan (BENICAR) 40 MG tablet Take 1 tablet (40 mg total) by mouth daily.  . Omega-3 Fatty Acids (FISH OIL PO) Take 1 tablet by mouth daily.  Marland Kitchen omeprazole-sodium bicarbonate (ZEGERID) 40-1100 MG capsule TAKE 1 CAPSULE BY MOUTH TWICE A DAY BEFORE BREAKFAST AND AT BEDTIME  . spironolactone (ALDACTONE) 25 MG tablet 12.5 mg.    * She has been to see neurology for foot drop- they performed nerve conduction studies on her and could not find cause of nerve drop- they have sent her for PT for gait and balance. She says was it helping so she stopped going and says she is just very careful when she is walking.  Hypertension  This is a chronic problem. The current episode  started more than 1 year ago. The problem is unchanged. The problem is controlled. Risk factors for coronary artery disease include dyslipidemia, obesity, post-menopausal state and smoking/tobacco exposure. Past treatments include calcium channel blockers and angiotensin blockers. The current treatment provides mild improvement. Compliance problems include diet and exercise.  There is no history of CAD/MI or CVA. Identifiable causes of hypertension include a thyroid problem.  Hyperlipidemia  This is a chronic problem. The current episode started more than 1 year ago. The problem is controlled. Recent lipid tests were reviewed and are normal. Exacerbating diseases include obesity. She has no history of diabetes or hypothyroidism. The current treatment provides moderate improvement of lipids. Compliance problems include adherence to diet and adherence to exercise.  Risk factors for coronary artery disease include dyslipidemia, hypertension, obesity and post-menopausal.  Thyroid Problem  Presents for follow-up (hypothyroidism) visit. The symptoms have been stable. Her past medical history is significant for hyperlipidemia. There is no history of diabetes.  GERD/dysphagia zegrid- works well for her- depression  lexapro working well to keep her symptoms under control- keeps her from worrying so much. She is still grieving some over the death of her son but is doing some better. GAD She is still grieving the loss of her son several months ago- she says she still cries everyday. CAD Had heart cath and was 30% blocked-  was started on eliquis and is doing quite well- no  C/o abnormal bleeding or bruising.   Review of Systems  Constitutional: Negative.   HENT: Negative.   Respiratory: Negative.   Cardiovascular: Negative.   Gastrointestinal: Negative.   Genitourinary: Negative.   Neurological: Negative.   Psychiatric/Behavioral: Negative.   All other systems reviewed and are negative.        Objective:   Physical Exam  Constitutional: She is oriented to person, place, and time. She appears well-developed and well-nourished.  HENT:  Nose: Nose normal.  Mouth/Throat: Oropharynx is clear and moist.  Eyes: EOM are normal.  Neck: Trachea normal, normal range of motion and full passive range of motion without pain. Neck supple. No JVD present. Carotid bruit is not present. No thyromegaly present.  Cardiovascular: Normal rate, regular rhythm, normal heart sounds and intact distal pulses.  Exam reveals no gallop and no friction rub.   No murmur heard. Pulmonary/Chest: Effort normal and breath sounds normal.  Abdominal: Soft. Bowel sounds are normal. She exhibits no distension and no mass. There is no tenderness.  Musculoskeletal: Normal range of motion.  Lymphadenopathy:    She has no cervical adenopathy.  Neurological: She is alert and oriented to person, place, and time. She has normal reflexes.  Skin: Skin is warm and dry.  Psychiatric: She has a normal mood and affect. Her behavior is normal. Judgment and thought content normal.   BP 139/63   Pulse (!) 53   Temp 97.2 F (36.2 C) (Oral)   Ht _0  (1.549 m)   Wt 146 lb (66.2 kg)   BMI 27.59 kg/m        Assessment & Plan:  1. Essential hypertension Low sodium diet - olmesartan (BENICAR) 40 MG tablet; Take 1 tablet (40 mg total) by mouth daily.  Dispense: 90 tablet; Refill: 1 - furosemide (LASIX) 20 MG tablet; Take 1 tablet (20 mg total) by mouth daily as needed for fluid or edema.  Dispense: 90 tablet; Refill: 1 - amLODipine (NORVASC) 5 MG tablet; Take 0.5 tablets (2.5 mg total) by mouth daily. TAKE 1 TABLET (5 MG TOTAL) BY MOUTH DAILY.  Dispense: 90 tablet; Refill: 1 - CMP14+EGFR  2. Gastroparesis Eat slowly Do not over eat  3. Gastroesophageal reflux disease without esophagitis Avoid spicy foods Do not eat 2 hours prior to bedtime - omeprazole-sodium bicarbonate (ZEGERID) 40-1100 MG capsule; TAKE 1 CAPSULE BY  MOUTH TWICE A DAY BEFORE BREAKFAST AND AT BEDTIME  Dispense: 180 capsule; Refill: 1  4. Hypothyroidism, unspecified type - levothyroxine (SYNTHROID, LEVOTHROID) 88 MCG tablet; Take 1 tablet (88 mcg total) by mouth daily before breakfast.  Dispense: 90 tablet; Refill: 1 - Thyroid Panel With TSH  5. BMI 27.0-27.9,adult Discussed diet and exercise for person with BMI >25 Will recheck weight in 3-6 months  6. Recurrent major depressive disorder, in partial remission ([place]) Stress management  7. Mixed hyperlipidemia Low fat diet - Lipid panel  8. Left foot drop Fall precautions  9. Coronary artery disease involving native coronary artery of native heart without angina pectoris Keep follow up with cardiology - apixaban (ELIQUIS) 2.5 MG TABS tablet; Take 1 tablet (2.5 mg total) by mouth 2 (two) times daily.  Dispense: 60 tablet; Refill: 3  10. Recurrent major depressive disorder, in full remission ([place]) - escitalopram (LEXAPRO) 20 MG tablet; Take 2 tablets (40 mg total) by mouth daily.  Dispense: 180 tablet; Refill: 1  - amiodarone (PACERONE) 200 MG tablet; Take 1 tablet (200  mg total) by mouth daily.  Dispense: 90 tablet; Refill: 1    Labs pending Health maintenance reviewed Diet and exercise encouraged Continue all meds Follow up  In 3 month   [place], [name]

## 2017-01-26 LAB — CMP14+EGFR
A/G RATIO: 1.7 (ref 1.2–2.2)
ALT: 62 IU/L — AB (ref 0–32)
AST: 48 IU/L — ABNORMAL HIGH (ref 0–40)
Albumin: 3.8 g/dL (ref 3.5–4.7)
Alkaline Phosphatase: 82 IU/L (ref 39–117)
BUN/Creatinine Ratio: 22 (ref 12–28)
BUN: 20 mg/dL (ref 8–27)
Bilirubin Total: 0.3 mg/dL (ref 0.0–1.2)
CALCIUM: 9.4 mg/dL (ref 8.7–10.3)
CO2: 23 mmol/L (ref 18–29)
Chloride: 100 mmol/L (ref 96–106)
Creatinine, Ser: 0.93 mg/dL (ref 0.57–1.00)
GFR calc non Af Amer: 57 mL/min/{1.73_m2} — ABNORMAL LOW (ref >59)
GFR, EST AFRICAN AMERICAN: 65 mL/min/{1.73_m2} (ref >59)
GLUCOSE: 90 mg/dL (ref 65–99)
Globulin, Total: 2.3 g/dL (ref 1.5–4.5)
Potassium: 5 mmol/L (ref 3.5–5.2)
Sodium: 138 mmol/L (ref 134–144)
TOTAL PROTEIN: 6.1 g/dL (ref 6.0–8.5)

## 2017-01-26 LAB — THYROID PANEL WITH TSH
FREE THYROXINE INDEX: 3.4 (ref 1.2–4.9)
T3 UPTAKE RATIO: 35 % (ref 24–39)
T4 TOTAL: 9.6 ug/dL (ref 4.5–12.0)
TSH: 0.922 u[IU]/mL (ref 0.450–4.500)

## 2017-01-26 LAB — LIPID PANEL
Chol/HDL Ratio: 4 ratio units (ref 0.0–4.4)
Cholesterol, Total: 186 mg/dL (ref 100–199)
HDL: 46 mg/dL (ref >39)
LDL Calculated: 105 mg/dL — ABNORMAL HIGH (ref 0–99)
Triglycerides: 176 mg/dL — ABNORMAL HIGH (ref 0–149)
VLDL Cholesterol Cal: 35 mg/dL (ref 5–40)

## 2017-02-05 ENCOUNTER — Telehealth: Payer: Self-pay | Admitting: Nurse Practitioner

## 2017-02-07 ENCOUNTER — Encounter: Payer: Self-pay | Admitting: Family Medicine

## 2017-02-07 ENCOUNTER — Ambulatory Visit (INDEPENDENT_AMBULATORY_CARE_PROVIDER_SITE_OTHER): Payer: Medicare Other | Admitting: Family Medicine

## 2017-02-07 VITALS — BP 139/69 | HR 55 | Temp 97.1°F | Ht 61.0 in | Wt 142.0 lb

## 2017-02-07 DIAGNOSIS — R319 Hematuria, unspecified: Secondary | ICD-10-CM

## 2017-02-07 DIAGNOSIS — I2510 Atherosclerotic heart disease of native coronary artery without angina pectoris: Secondary | ICD-10-CM | POA: Diagnosis not present

## 2017-02-07 DIAGNOSIS — N3001 Acute cystitis with hematuria: Secondary | ICD-10-CM | POA: Diagnosis not present

## 2017-02-07 LAB — URINALYSIS
BILIRUBIN UA: NEGATIVE
Glucose, UA: NEGATIVE
Ketones, UA: NEGATIVE
NITRITE UA: NEGATIVE
PH UA: 6.5 (ref 5.0–7.5)
Protein, UA: NEGATIVE
SPEC GRAV UA: 1.015 (ref 1.005–1.030)
UUROB: 1 mg/dL (ref 0.2–1.0)

## 2017-02-07 MED ORDER — DOXYCYCLINE HYCLATE 100 MG PO TABS: 100.0000 mg | ORAL_TABLET | Freq: Two times a day (BID) | ORAL | 0 refills | Status: DC

## 2017-02-07 NOTE — Progress Notes (Signed)
BP 139/69   Pulse (!) 55   Temp 97.1 F (36.2 C) (Oral)   Ht 5\' 1"  (1.549 m)   Wt 142 lb (64.4 kg)   BMI 26.83 kg/m    Subjective:    Patient ID: Erica [name], female    DOB: [birth year], 83 y.o.   MRN: [id number]  HPI: Erica [name] is a 83 y.o. female presenting on 02/07/2017 for Hematuria   HPI Hematuria and abdominal pressure Patient has been having hematuria and abdominal pressure this been going on for the past 4 days. She had 2 episodes of hematuria on the same day 4 days ago and has since backed off on taking her Eliquis. She denies any fevers or chills or flank pain. The hematuria has mostly resolved and is just a trace amount since that time. She has not had these very frequently but has had them before. She denies any urinary frequency or hesitancy. She does feel some lower abdominal pressure and it is worse prior to urination sometimes.  Relevant past medical, surgical, family and social history reviewed and updated as indicated. Interim medical history since our last visit reviewed. Allergies and medications reviewed and updated.  Review of Systems  Constitutional: Negative for chills and fever.  Respiratory: Negative for chest tightness and shortness of breath.   Cardiovascular: Negative for chest pain and leg swelling.  Gastrointestinal: Positive for abdominal pain.  Genitourinary: Positive for hematuria. Negative for difficulty urinating, dysuria, frequency, urgency, vaginal bleeding, vaginal discharge and vaginal pain.  Musculoskeletal: Negative for back pain and gait problem.  Skin: Negative for rash.  Neurological: Negative for light-headedness and headaches.  Psychiatric/Behavioral: Negative for agitation and behavioral problems.  All other systems reviewed and are negative.   Per HPI unless specifically indicated above     Objective:    BP 139/69   Pulse (!) 55   Temp 97.1 F (36.2 C) (Oral)   Ht 5\' 1"  (1.549 m)   Wt 142 lb (64.4 kg)   BMI  26.83 kg/m   Wt Readings from Last 3 Encounters:  02/07/17 142 lb (64.4 kg)  01/25/17 146 lb (66.2 kg)  10/21/16 145 lb (65.8 kg)    Physical Exam  Constitutional: She is oriented to person, place, and time. She appears well-developed and well-nourished. No distress.  Eyes: Conjunctivae are normal.  Cardiovascular: Normal rate, regular rhythm, normal heart sounds and intact distal pulses.   No murmur heard. Pulmonary/Chest: Effort normal and breath sounds normal. No respiratory distress. She has no wheezes. She has no rales.  Abdominal: Soft. Bowel sounds are normal. She exhibits no distension. There is tenderness (Mild suprapubic abdominal pressure, no CVA tenderness). There is no rebound and no guarding.  Musculoskeletal: Normal range of motion. She exhibits no edema or tenderness.  Neurological: She is alert and oriented to person, place, and time. Coordination normal.  Skin: Skin is warm and dry. No rash noted. She is not diaphoretic.  Psychiatric: She has a normal mood and affect. Her behavior is normal.  Nursing note and vitals reviewed.   Urinalysis: 2+ blood, 1+ bilirubin, 1+ leukocytes, otherwise normal    Assessment & Plan:   Problem List Items Addressed This Visit    None    Visit Diagnoses    Acute cystitis with hematuria    -  Primary   Relevant Medications   doxycycline (VIBRA-TABS) 100 MG tablet   Hematuria, unspecified type       Relevant Medications   doxycycline (VIBRA-TABS)  100 MG tablet   Other Relevant Orders   Urinalysis   Urine culture       Follow up plan: Return if symptoms worsen or fail to improve.  Counseling provided for all of the vaccine components Orders Placed This Encounter  Procedures  . Urine culture  . Urinalysis    Caryl Pina, MD [facility] 02/07/2017, 5:06 PM

## 2017-02-08 ENCOUNTER — Ambulatory Visit: Payer: Medicare Other | Admitting: Physician Assistant

## 2017-02-09 IMAGING — CT CT HEAD W/O CM
4 series · 17 of 47 positions shown, 19 images · non-contrast
Comparison: 04/21/2016.

CLINICAL DATA: Increased falls.   Rule out stroke.

EXAM:
CT HEAD WITHOUT CONTRAST
TECHNIQUE: Contiguous axial images were obtained from the base of the skull
through the vertex without intravenous contrast.

[Series 2: head without · axial · non-contrast · 0.43mm/px · z∈[+1221,+1341]mm · 7 of 34 slices shown, 9 images]
[im 5/34  brain]
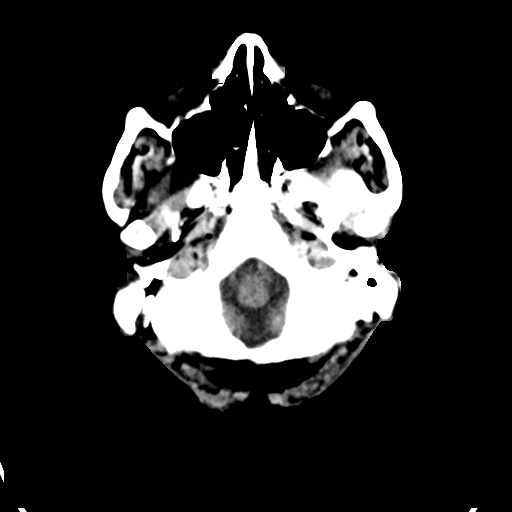
[im 5/34  bone]
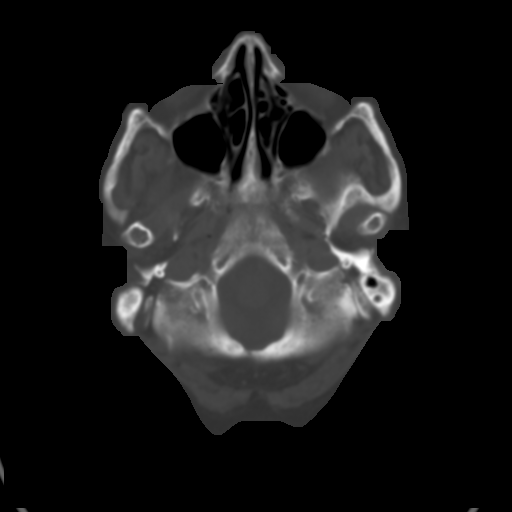
[im 9/34  brain]
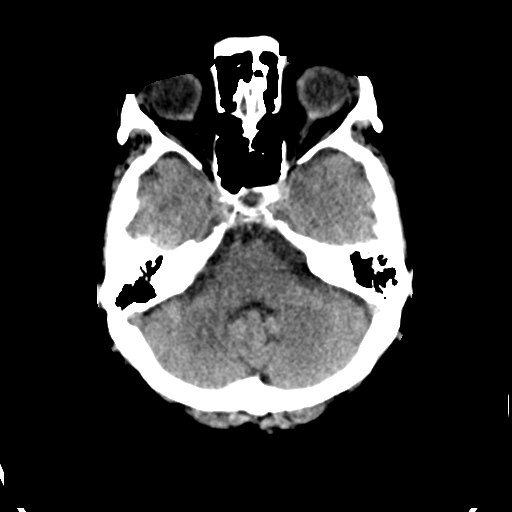
[im 13/34  brain]
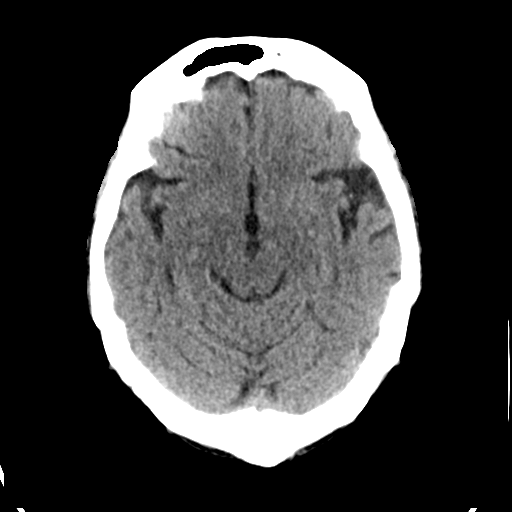
[im 17/34  brain]
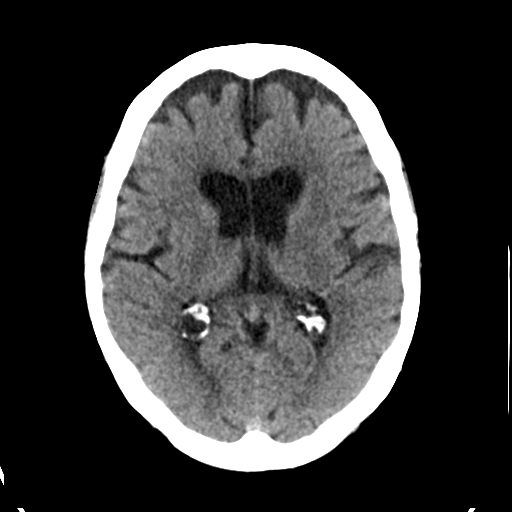
[im 21/34  brain]
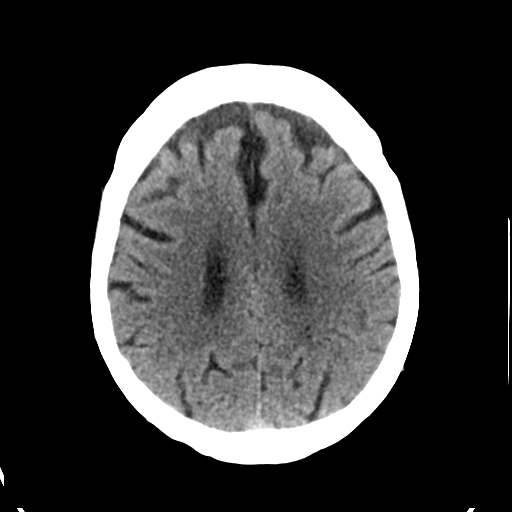
[im 21/34  bone]
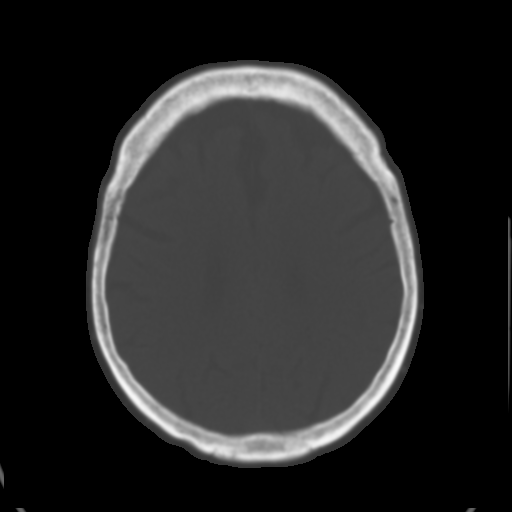
[im 25/34  brain]
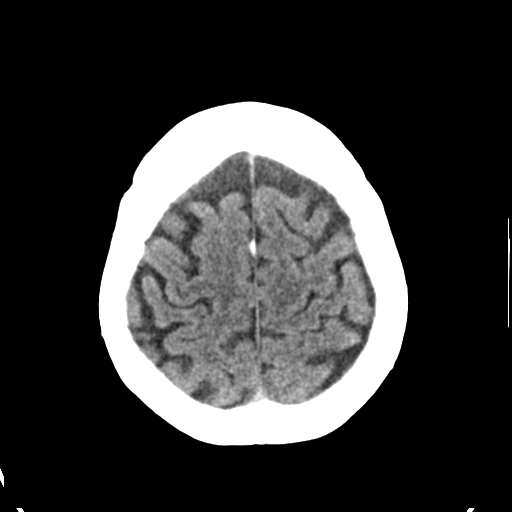
[im 29/34  brain]
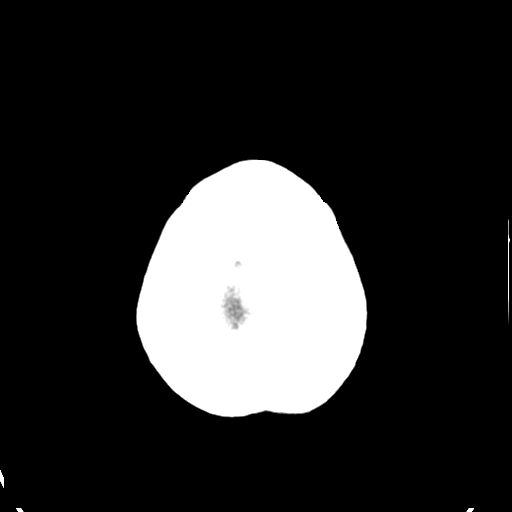

[Series 3: head bone · axial · 0.43mm/px · z∈[+1217,+1275]mm · 4 of 84 slices shown]
[im 9/84  bone]
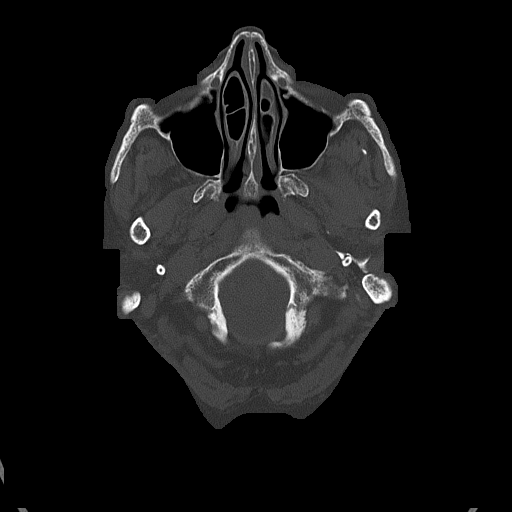
[im 17/84  bone]
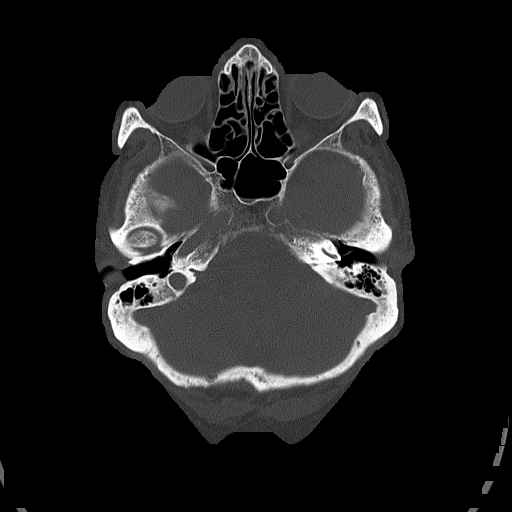
[im 25/84  bone]
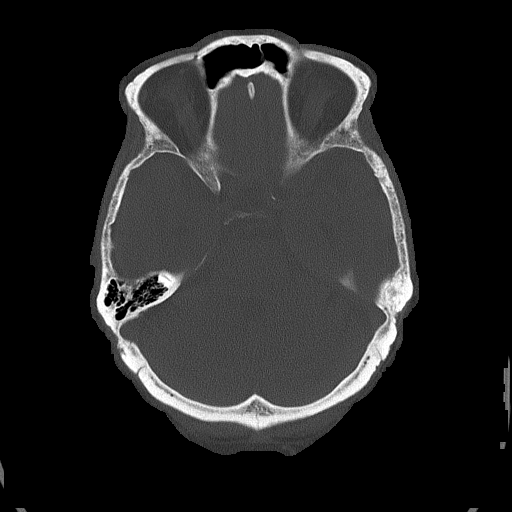
[im 38/84  bone]
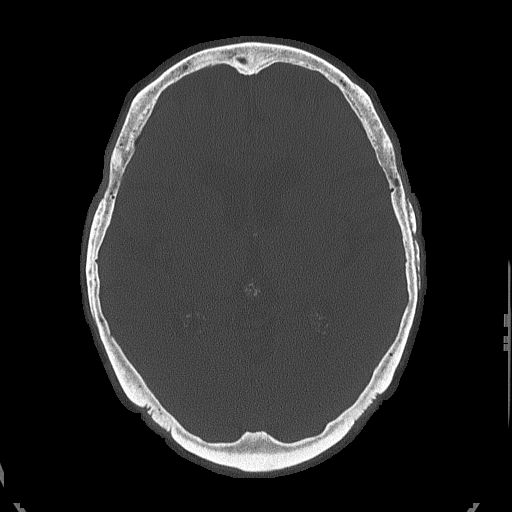

[Series 4: head without cor · coronal · non-contrast · 0.30mm/px · 3 of 65 slices shown]
[im 22/65  brain]
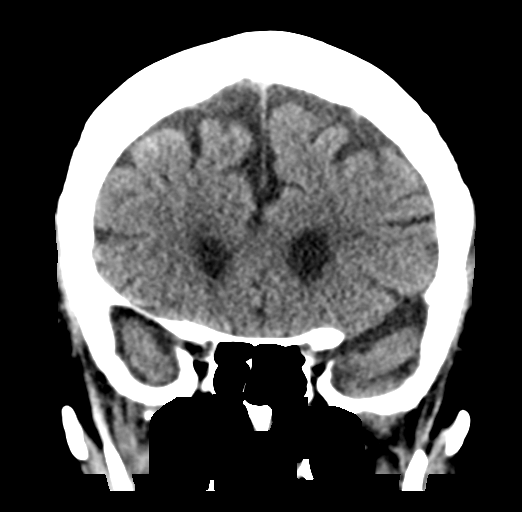
[im 29/65  brain]
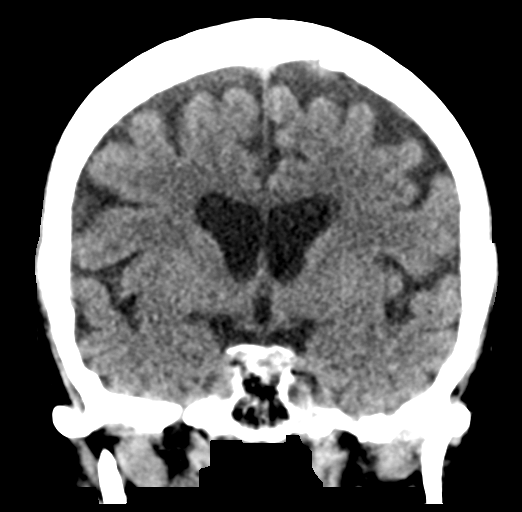
[im 36/65  brain]
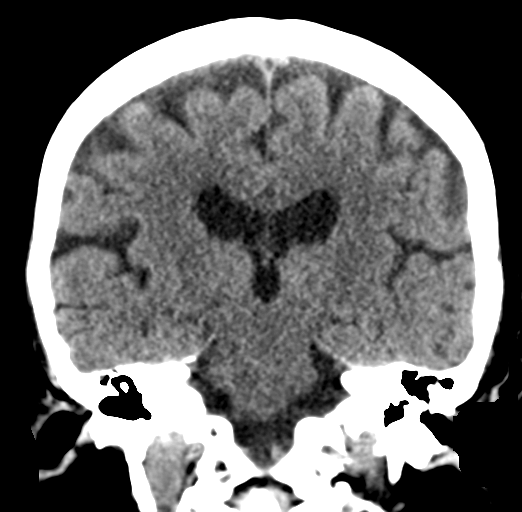

[Series 5: head without sag · sagittal · non-contrast · 0.32mm/px · 3 of 54 slices shown]
[im 18/54  brain]
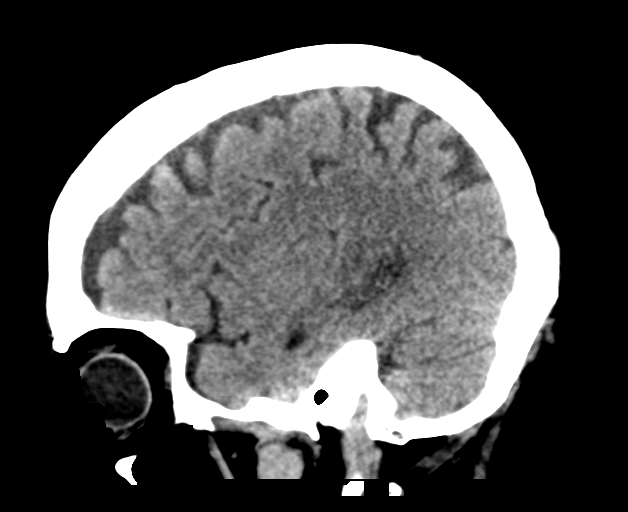
[im 27/54  brain]
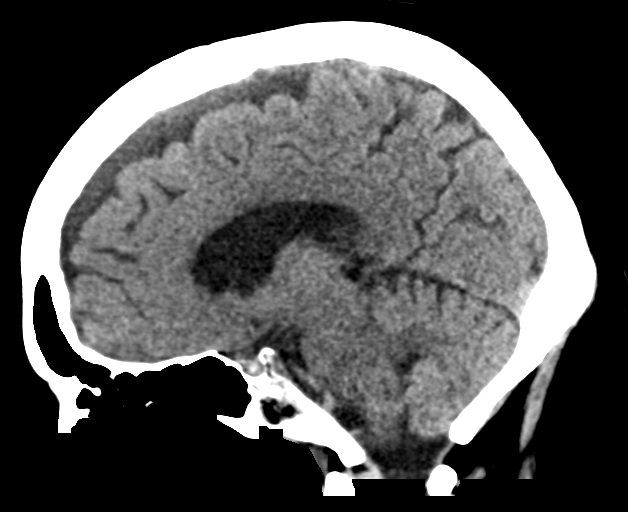
[im 36/54  brain]
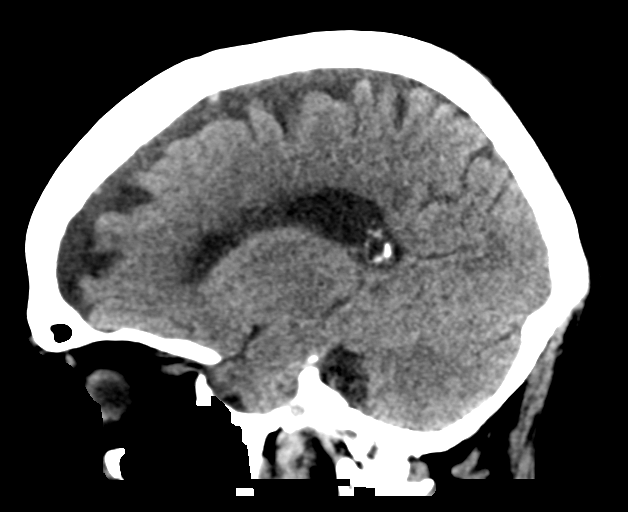

[17 of 47 positions shown; findings below may reference images not displayed]

FINDINGS: Brain: Mild low density in the periventricular white matter likely
related to small vessel disease. Expected cerebral volume loss for
age. No mass lesion, hemorrhage, hydrocephalus, acute infarct,
intra-axial, or extra-axial fluid collection.

Vascular: No hyperdense vessel or unexpected calcification.

Skull: No significant soft tissue swelling.  No skull fracture.

Sinuses/Orbits: Minimal fluid in the dependent sphenoid sinus. Other
paranasal sinuses and mastoid air cells are clear. Normal orbits and
globes.

Other: None
IMPRESSION: 1.  No acute intracranial abnormality.
2. Small vessel ischemic change.
3. Minimal fluid in the dependent sphenoid sinus, favored to relate
to mild sinusitis.

## 2017-02-11 LAB — SPECIMEN STATUS REPORT

## 2017-02-11 LAB — URINE CULTURE

## 2017-02-15 ENCOUNTER — Encounter: Payer: Self-pay | Admitting: Nurse Practitioner

## 2017-02-15 ENCOUNTER — Ambulatory Visit (INDEPENDENT_AMBULATORY_CARE_PROVIDER_SITE_OTHER): Payer: Medicare Other | Admitting: Nurse Practitioner

## 2017-02-15 VITALS — BP 139/72 | HR 60 | Temp 97.3°F | Ht 61.0 in | Wt 144.0 lb

## 2017-02-15 DIAGNOSIS — R319 Hematuria, unspecified: Secondary | ICD-10-CM | POA: Diagnosis not present

## 2017-02-15 DIAGNOSIS — I2510 Atherosclerotic heart disease of native coronary artery without angina pectoris: Secondary | ICD-10-CM | POA: Diagnosis not present

## 2017-02-15 NOTE — Progress Notes (Signed)
   Subjective:    Patient ID: Erica [name], female    DOB: May 12, 1930, 83 y.o.   MRN: [id number]  HPI Erica [name] comes in today for recheck. SHe was seen by Dr. Warrick Parisian on 02/07/17 with hematuria. Dx with acute cystitis and was given doycycline. SHe says that she feels no different- having lots of suprapubic pressure- says that it really does not burn when she pees. Not sure if still has blood in urine or not.    Review of Systems  Constitutional: Negative.   HENT: Negative.   Respiratory: Negative.   Cardiovascular: Negative.   Genitourinary: Positive for pelvic pain and urgency (slightly). Negative for dysuria.  Neurological: Negative.   Psychiatric/Behavioral: Negative.   All other systems reviewed and are negative.      Objective:   Physical Exam  Constitutional: She appears well-developed and well-nourished. No distress.  Cardiovascular: Normal rate.   Pulmonary/Chest: Effort normal.  Abdominal: Soft.  Neurological: She is alert.  Skin: Skin is warm.  Psychiatric: She has a normal mood and affect. Her behavior is normal. Judgment and thought content normal.    BP 139/72   Pulse 60   Temp 97.3 F (36.3 C) (Oral)   Ht 5\' 1"  (1.549 m)   Wt 144 lb (65.3 kg)   BMI 27.21 kg/m   Urine clear today other then having blood in urine.      Assessment & Plan:  1. Hematuria, unspecified type Force fluids Am hopig urologist will do U/S while in office - Ambulatory referral to Urology - Urinalysis, Complete  Mary-Margaret Hassell Done, [name]

## 2017-02-15 NOTE — Patient Instructions (Signed)

## 2017-02-16 LAB — URINALYSIS, COMPLETE
Bilirubin, UA: NEGATIVE
Glucose, UA: NEGATIVE
Ketones, UA: NEGATIVE
LEUKOCYTES UA: NEGATIVE
Nitrite, UA: NEGATIVE
PH UA: 5.5 (ref 5.0–7.5)
PROTEIN UA: NEGATIVE
Specific Gravity, UA: 1.01 (ref 1.005–1.030)
UUROB: 0.2 mg/dL (ref 0.2–1.0)

## 2017-02-16 LAB — MICROSCOPIC EXAMINATION
BACTERIA UA: NONE SEEN
RENAL EPITHEL UA: NONE SEEN /HPF

## 2017-02-18 ENCOUNTER — Telehealth: Payer: Self-pay | Admitting: Nurse Practitioner

## 2017-02-18 NOTE — Telephone Encounter (Signed)
Pt aware that [facility] will be contacting her with an appointment date/time

## 2017-03-09 DIAGNOSIS — E785 Hyperlipidemia, unspecified: Secondary | ICD-10-CM | POA: Diagnosis not present

## 2017-03-09 DIAGNOSIS — I10 Essential (primary) hypertension: Secondary | ICD-10-CM | POA: Diagnosis not present

## 2017-03-09 DIAGNOSIS — E039 Hypothyroidism, unspecified: Secondary | ICD-10-CM | POA: Diagnosis not present

## 2017-03-09 DIAGNOSIS — F419 Anxiety disorder, unspecified: Secondary | ICD-10-CM | POA: Diagnosis not present

## 2017-03-09 DIAGNOSIS — I480 Paroxysmal atrial fibrillation: Secondary | ICD-10-CM | POA: Diagnosis not present

## 2017-03-09 DIAGNOSIS — I2510 Atherosclerotic heart disease of native coronary artery without angina pectoris: Secondary | ICD-10-CM | POA: Diagnosis not present

## 2017-03-09 DIAGNOSIS — M1990 Unspecified osteoarthritis, unspecified site: Secondary | ICD-10-CM | POA: Diagnosis not present

## 2017-05-04 DIAGNOSIS — I2510 Atherosclerotic heart disease of native coronary artery without angina pectoris: Secondary | ICD-10-CM | POA: Diagnosis not present

## 2017-05-04 DIAGNOSIS — F419 Anxiety disorder, unspecified: Secondary | ICD-10-CM | POA: Diagnosis not present

## 2017-05-04 DIAGNOSIS — I10 Essential (primary) hypertension: Secondary | ICD-10-CM | POA: Diagnosis not present

## 2017-05-04 DIAGNOSIS — M1990 Unspecified osteoarthritis, unspecified site: Secondary | ICD-10-CM | POA: Diagnosis not present

## 2017-05-04 DIAGNOSIS — E039 Hypothyroidism, unspecified: Secondary | ICD-10-CM | POA: Diagnosis not present

## 2017-05-04 DIAGNOSIS — E785 Hyperlipidemia, unspecified: Secondary | ICD-10-CM | POA: Diagnosis not present

## 2017-05-04 DIAGNOSIS — R002 Palpitations: Secondary | ICD-10-CM | POA: Diagnosis not present

## 2017-05-04 DIAGNOSIS — I480 Paroxysmal atrial fibrillation: Secondary | ICD-10-CM | POA: Diagnosis not present

## 2017-05-10 DIAGNOSIS — I10 Essential (primary) hypertension: Secondary | ICD-10-CM | POA: Diagnosis not present

## 2017-05-10 DIAGNOSIS — E785 Hyperlipidemia, unspecified: Secondary | ICD-10-CM | POA: Diagnosis not present

## 2017-05-10 DIAGNOSIS — I480 Paroxysmal atrial fibrillation: Secondary | ICD-10-CM | POA: Diagnosis not present

## 2017-05-10 DIAGNOSIS — E039 Hypothyroidism, unspecified: Secondary | ICD-10-CM | POA: Diagnosis not present

## 2017-05-18 DIAGNOSIS — N3941 Urge incontinence: Secondary | ICD-10-CM | POA: Diagnosis not present

## 2017-05-18 DIAGNOSIS — R310 Gross hematuria: Secondary | ICD-10-CM | POA: Diagnosis not present

## 2017-05-18 DIAGNOSIS — N952 Postmenopausal atrophic vaginitis: Secondary | ICD-10-CM | POA: Diagnosis not present

## 2017-05-20 ENCOUNTER — Telehealth: Payer: Self-pay | Admitting: Nurse Practitioner

## 2017-05-20 ENCOUNTER — Other Ambulatory Visit: Payer: Self-pay | Admitting: Nurse Practitioner

## 2017-05-20 DIAGNOSIS — R319 Hematuria, unspecified: Secondary | ICD-10-CM

## 2017-05-20 DIAGNOSIS — N3001 Acute cystitis with hematuria: Secondary | ICD-10-CM

## 2017-05-20 MED ORDER — DOXYCYCLINE HYCLATE 100 MG PO TABS: 100.0000 mg | ORAL_TABLET | Freq: Two times a day (BID) | ORAL | 0 refills | Status: DC

## 2017-05-20 NOTE — Telephone Encounter (Signed)
Patient states she seen Dr. Roni Bread on Wed and she looked fine but then last night she started to get a UTI. Is having dysuria and trouble urinating since last night. Advised patient she needed to come in to be seen since she was last seen 2/20 but patient does not want to. Would like antibiotic sent in. Please advise

## 2017-06-14 DIAGNOSIS — L904 Acrodermatitis chronica atrophicans: Secondary | ICD-10-CM | POA: Diagnosis not present

## 2017-07-18 ENCOUNTER — Other Ambulatory Visit: Payer: Self-pay | Admitting: Nurse Practitioner

## 2017-07-18 DIAGNOSIS — I10 Essential (primary) hypertension: Secondary | ICD-10-CM

## 2017-08-03 DIAGNOSIS — I480 Paroxysmal atrial fibrillation: Secondary | ICD-10-CM | POA: Diagnosis not present

## 2017-08-03 DIAGNOSIS — M1990 Unspecified osteoarthritis, unspecified site: Secondary | ICD-10-CM | POA: Diagnosis not present

## 2017-08-03 DIAGNOSIS — I2510 Atherosclerotic heart disease of native coronary artery without angina pectoris: Secondary | ICD-10-CM | POA: Diagnosis not present

## 2017-08-03 DIAGNOSIS — I10 Essential (primary) hypertension: Secondary | ICD-10-CM | POA: Diagnosis not present

## 2017-08-03 DIAGNOSIS — E039 Hypothyroidism, unspecified: Secondary | ICD-10-CM | POA: Diagnosis not present

## 2017-08-03 DIAGNOSIS — F419 Anxiety disorder, unspecified: Secondary | ICD-10-CM | POA: Diagnosis not present

## 2017-08-03 DIAGNOSIS — E785 Hyperlipidemia, unspecified: Secondary | ICD-10-CM | POA: Diagnosis not present

## 2017-08-08 ENCOUNTER — Other Ambulatory Visit: Payer: Self-pay | Admitting: Nurse Practitioner

## 2017-08-08 DIAGNOSIS — I2510 Atherosclerotic heart disease of native coronary artery without angina pectoris: Secondary | ICD-10-CM

## 2017-08-09 NOTE — Telephone Encounter (Signed)
rx sent to pharmacy

## 2017-08-09 NOTE — Telephone Encounter (Signed)
Last seen 02/15/17  MMM

## 2017-08-09 NOTE — Telephone Encounter (Signed)
Last refill without being seen 

## 2017-08-21 ENCOUNTER — Emergency Department (HOSPITAL_BASED_OUTPATIENT_CLINIC_OR_DEPARTMENT_OTHER): Payer: Medicare Other

## 2017-08-21 ENCOUNTER — Emergency Department (HOSPITAL_BASED_OUTPATIENT_CLINIC_OR_DEPARTMENT_OTHER)
Admission: EM | Admit: 2017-08-21 | Discharge: 2017-08-21 | Disposition: A | Discharge status: Home / Self Care | Payer: Medicare Other | Attending: Emergency Medicine | Admitting: Emergency Medicine

## 2017-08-21 ENCOUNTER — Encounter (HOSPITAL_BASED_OUTPATIENT_CLINIC_OR_DEPARTMENT_OTHER): Payer: Self-pay | Admitting: Emergency Medicine

## 2017-08-21 DIAGNOSIS — S0990XA Unspecified injury of head, initial encounter: Secondary | ICD-10-CM | POA: Diagnosis not present

## 2017-08-21 DIAGNOSIS — E039 Hypothyroidism, unspecified: Secondary | ICD-10-CM | POA: Diagnosis not present

## 2017-08-21 DIAGNOSIS — Y9301 Activity, walking, marching and hiking: Secondary | ICD-10-CM | POA: Diagnosis not present

## 2017-08-21 DIAGNOSIS — Z23 Encounter for immunization: Secondary | ICD-10-CM | POA: Diagnosis not present

## 2017-08-21 DIAGNOSIS — I10 Essential (primary) hypertension: Secondary | ICD-10-CM | POA: Diagnosis not present

## 2017-08-21 DIAGNOSIS — Y929 Unspecified place or not applicable: Secondary | ICD-10-CM | POA: Diagnosis not present

## 2017-08-21 DIAGNOSIS — Z79899 Other long term (current) drug therapy: Secondary | ICD-10-CM | POA: Diagnosis not present

## 2017-08-21 DIAGNOSIS — R0789 Other chest pain: Secondary | ICD-10-CM | POA: Diagnosis not present

## 2017-08-21 DIAGNOSIS — Y999 Unspecified external cause status: Secondary | ICD-10-CM | POA: Diagnosis not present

## 2017-08-21 DIAGNOSIS — S0101XA Laceration without foreign body of scalp, initial encounter: Secondary | ICD-10-CM | POA: Diagnosis not present

## 2017-08-21 DIAGNOSIS — W0110XA Fall on same level from slipping, tripping and stumbling with subsequent striking against unspecified object, initial encounter: Secondary | ICD-10-CM | POA: Diagnosis not present

## 2017-08-21 DIAGNOSIS — Z7901 Long term (current) use of anticoagulants: Secondary | ICD-10-CM | POA: Insufficient documentation

## 2017-08-21 DIAGNOSIS — S80211A Abrasion, right knee, initial encounter: Secondary | ICD-10-CM | POA: Diagnosis not present

## 2017-08-21 DIAGNOSIS — S199XXA Unspecified injury of neck, initial encounter: Secondary | ICD-10-CM | POA: Diagnosis not present

## 2017-08-21 DIAGNOSIS — S01111A Laceration without foreign body of right eyelid and periocular area, initial encounter: Secondary | ICD-10-CM | POA: Diagnosis not present

## 2017-08-21 MED ORDER — TETANUS-DIPHTH-ACELL PERTUSSIS 5-2.5-18.5 LF-MCG/0.5 IM SUSP
0.5000 mL | Freq: Once | INTRAMUSCULAR | Status: AC
  Administered 2017-08-21: 0.5 mL via INTRAMUSCULAR
  Filled 2017-08-21: qty 0.5

## 2017-08-21 NOTE — ED Notes (Addendum)
Skin tear to right eyebrow measures 1.5 cm,  Small skin tear to right side of her nasal bridge.  Abrasion to right knee.  Pt denies pain.

## 2017-08-21 NOTE — ED Triage Notes (Addendum)
Patient reports fall with head injury this morning onto concrete.  Reports she currently takes eloquis.  Reports + LOC. Swelling and ecchymosis noted to right side of head.   Alert and oriented at present,  speaking in full sentences in NAD.  Laceration noted above right eye without bleeding at present.

## 2017-08-21 NOTE — ED Notes (Signed)
ED Provider at bedside. 

## 2017-08-21 NOTE — Discharge Instructions (Signed)
TAKE TYLENOL ON A SCHEDULE AS NEEDED FOR PAIN. RETURN TO  ER IF ANY SIGNS OF INFECTION OF WOUND; VOMITING, CONFUSION, OR LETHARGY; OR FOR EXTREMITY WEAKNESS.

## 2017-08-21 NOTE — ED Provider Notes (Signed)
[facility] Provider Note   CSN: [id number] Arrival date & time: 08/21/17  1504     History   Chief Complaint Chief Complaint  Patient presents with  . Head Injury    HPI Erica [name] is a 83 y.o. female.  83yo F w/ PMH including A fib on Eliquis, gastroparesis, GERD, TIA who presents with head injury. Around 12:30 today, she had a mechanical fall and struck her head on a potted plant. She is not sure whether she lost consciousness. She reports pain over her right eyebrow and some generalized soreness including soreness of her chest where she hit. She has been ambulatory since the event with no focal joint pain. No vomiting, vision changes, balance problems, or confusion. She presents because she was concerned about the head injury given her anticoagulant use. Unknown last tetanus.   The history is provided by the patient.    Past Medical History:  Diagnosis Date  . Abnormality of gait 08/25/2016  . Adenomatous rectal polyp   . Atrial fibrillation ([place])   . Bleeding internal hemorrhoids   . Chronic back pain   . Diverticulosis of colon (without mention of hemorrhage)   . Gastroparesis   . GERD (gastroesophageal reflux disease)   . Hiatal hernia 09/1999   3cm  . Hyperlipidemia   . Hypertension   . Hypothyroid   . Ischemic colitis ([place])   . Left foot drop 08/25/2016  . Lichen sclerosus et atrophicus of the vulva   . MVP (mitral valve prolapse)   . TIA (transient ischemic attack)   . Weakness     Patient Active Problem List   Diagnosis Date Noted  . Abnormality of gait 08/25/2016  . Left foot drop 08/25/2016  . Dysphagia   . Internal bleeding hemorrhoids 01/01/2016  . Hypothyroidism 07/07/2015  . BMI 27.0-27.9,adult 07/07/2015  . HTN (hypertension) 12/13/2012  . Hyperlipidemia 12/13/2012  . Depression 06/06/2012  . Gastroparesis 02/17/2012  . History of colon polyps 08/06/2011  . GERD (gastroesophageal reflux disease) 08/06/2011    Past  Surgical History:  Procedure Laterality Date  . CARDIAC CATHETERIZATION N/A 02/03/2016   Procedure: Coronary/Graft Angiography;  Surgeon: Charolette Forward, MD;  Location: [facility];  Service: Cardiovascular;  Laterality: N/A;  . CATARACT EXTRACTION    . CHOLECYSTECTOMY    . COLONOSCOPY     Multiple, polyps  . ESOPHAGEAL MANOMETRY N/A 06/14/2016   Procedure: ESOPHAGEAL MANOMETRY (EM);  Surgeon: Gatha Mayer, MD;  Location: [facility];  Service: Endoscopy;  Laterality: N/A;  . LEFT HEART CATHETERIZATION WITH CORONARY ANGIOGRAM N/A 07/17/2013   Procedure: LEFT HEART CATHETERIZATION WITH CORONARY ANGIOGRAM;  Surgeon: Clent Demark, MD;  Location: [facility];  Service: Cardiovascular;  Laterality: N/A;  . SIGMOIDOSCOPY  08/23/2011  . TUBAL LIGATION    . UPPER GASTROINTESTINAL ENDOSCOPY  08/23/2011   Normal    OB History    No data available       Home Medications    Prior to Admission medications   Medication Sig Start Date End Date Taking? Authorizing Provider  ALPRAZolam Duanne Moron) 0.5 MG tablet Take 0.5 mg by mouth at bedtime as needed for anxiety.    [provider]  amiodarone (PACERONE) 200 MG tablet Take 1 tablet (200 mg total) by mouth daily. 01/25/17   Hassell Done, Mary-Margaret, [name]  amLODipine (NORVASC) 5 MG tablet TAKE 1 TABLET DAILY 07/19/17   Hassell Done, Mary-Margaret, [name]  apixaban (ELIQUIS) 2.5 MG TABS tablet Take 1 tablet (2.5  mg total) by mouth 2 (two) times daily. 01/25/17   Hassell Done Mary-Margaret, [name]  Cholecalciferol (VITAMIN D PO) Take 1 tablet by mouth daily.    [provider]  doxycycline (VIBRA-TABS) 100 MG tablet Take 1 tablet (100 mg total) by mouth 2 (two) times daily. 1 po bid 05/20/17   Hassell Done, Mary-Margaret, [name]  ELIQUIS 2.5 MG TABS tablet TAKE  (1)  TABLET TWICE A DAY. 08/09/17   Hassell Done, Mary-Margaret, [name]  escitalopram (LEXAPRO) 20 MG tablet Take 2 tablets (40 mg total) by mouth daily. 01/25/17   Hassell Done, Mary-Margaret, [name]  EVENING PRIMROSE OIL  PO Take 1 capsule by mouth daily.    [provider]  furosemide (LASIX) 20 MG tablet TAKE 1 TABLET ONCE DAILY AS NEEDED FOR EDEMA OR FLUID 07/19/17   Hassell Done, Mary-Margaret, [name]  levothyroxine (SYNTHROID, LEVOTHROID) 88 MCG tablet Take 1 tablet (88 mcg total) by mouth daily before breakfast. 01/25/17   Hassell Done, Mary-Margaret, [name]  Melatonin 5 MG CAPS Take 5 mg by mouth at bedtime as needed (sleep).    [provider]  Multiple Vitamin (MULTIVITAMIN WITH MINERALS) TABS tablet Take 1 tablet by mouth daily.    [provider]  olmesartan (BENICAR) 40 MG tablet Take 1 tablet (40 mg total) by mouth daily. 01/25/17   Hassell Done Mary-Margaret, [name]  Omega-3 Fatty Acids (FISH OIL PO) Take 1 tablet by mouth daily.    [provider]  omeprazole-sodium bicarbonate (ZEGERID) 40-1100 MG capsule TAKE 1 CAPSULE BY MOUTH TWICE A DAY BEFORE BREAKFAST AND AT BEDTIME 01/25/17   Hassell Done, Mary-Margaret, [name]  spironolactone (ALDACTONE) 25 MG tablet 12.5 mg.  04/28/16   [provider]    Family History Family History  Problem Relation Age of Onset  . Stomach cancer Brother   . Colon cancer Paternal Aunt   . Lung cancer Brother        smoker  . Esophageal cancer Father   . Stroke Mother     Social History Social History  Substance Use Topics  . Smoking status: Never Smoker  . Smokeless tobacco: Never Used  . Alcohol use No     Allergies   Contrast media [iodinated diagnostic agents]; Iodine; Iohexol; Ioxaglate; and Metoclopramide   Review of Systems Review of Systems All other systems reviewed and are negative except that which was mentioned in HPI   Physical Exam Updated Vital Signs BP (!) 158/55 (BP Location: Right Arm)   Pulse (!) 58   Temp (!) 97.4 F (36.3 C) (Oral)   Resp 16   Ht 5\' 2"  (1.575 m)   Wt 63.5 kg (140 lb)   SpO2 100%   BMI 25.61 kg/m   Physical Exam  Constitutional: She is oriented to person, place, and time. She appears  well-developed and well-nourished. No distress.  Awake, alert  HENT:  Head: Normocephalic.  Ecchymosis under R eye and on R cheek; abrasion R nasal bridge; superficial 1cm laceration just above lateral R eyebrow  Eyes: Pupils are equal, round, and reactive to light. Conjunctivae and EOM are normal.  Neck: Normal range of motion. Neck supple.  Cardiovascular: Normal rate, regular rhythm, normal heart sounds and intact distal pulses.   No murmur heard. Pulmonary/Chest: Effort normal and breath sounds normal. No respiratory distress. She exhibits tenderness (mild generalized on anterior chest, no crepitus).  Abdominal: Soft. Bowel sounds are normal. She exhibits no distension. There is no tenderness.  Musculoskeletal: Normal range of motion. She exhibits no edema, tenderness or deformity.  Neurological:  She is alert and oriented to person, place, and time. She has normal reflexes. No cranial nerve deficit. She exhibits normal muscle tone.  Fluent speech, normal finger-to-nose testing, negative pronator drift 5/5 strength and normal sensation x all 4 extremities  Skin: Skin is warm and dry.  R knee abrasion  Psychiatric: She has a normal mood and affect. Judgment and thought content normal.  Nursing note and vitals reviewed.    ED Treatments / Results  Labs (all labs ordered are listed, but only abnormal results are displayed) Labs Reviewed - No data to display  EKG  EKG Interpretation None       Radiology Dg Chest 2 View  Result Date: 08/21/2017 CLINICAL DATA:  [place] at 1300 hours today.  Chest soreness. EXAM: CHEST  2 VIEW COMPARISON:  Chest radiograph January 31, 2016 FINDINGS: Cardiomediastinal silhouette is normal. Calcified aortic arch. No pleural effusions or focal consolidations. Chronic interstitial changes. Trachea projects midline and there is no pneumothorax. Slight cortical irregularity LEFT posterior third rib. Surgical clips in the included right abdomen compatible with  cholecystectomy. IMPRESSION: Possible acute nondisplaced LEFT posterior third rib. Recommend correlation with point tenderness. Mild chronic interstitial changes without acute cardiopulmonary process. Aortic Atherosclerosis (ICD10-I70.0). Electronically Signed   By: Elon Alas M.D.   On: 08/21/2017 16:38   Ct Head Wo Contrast  Result Date: 08/21/2017 CLINICAL DATA:  Fall, head injury, on Eliquis, swelling/ bruising to right head, right eye laceration EXAM: CT HEAD WITHOUT CONTRAST CT CERVICAL SPINE WITHOUT CONTRAST TECHNIQUE: Multidetector CT imaging of the head and cervical spine was performed following the standard protocol without intravenous contrast. Multiplanar CT image reconstructions of the cervical spine were also generated. COMPARISON:  MRI brain dated 07/01/2016.  CT head dated 07/01/2016. FINDINGS: CT HEAD FINDINGS Brain: No evidence of acute infarction, hemorrhage, hydrocephalus, extra-axial collection or mass lesion/mass effect. Subcortical white matter and periventricular small vessel ischemic changes. Mild cortical atrophy. Vascular: Mild intracranial atherosclerosis. Skull: Normal. Negative for fracture or focal lesion. Sinuses/Orbits: The visualized paranasal sinuses are essentially clear. The mastoid air cells are unopacified. Other: None. CT CERVICAL SPINE FINDINGS Alignment: Normal cervical lordosis. Skull base and vertebrae: No acute fracture. No primary bone lesion or focal pathologic process. Soft tissues and spinal canal: No prevertebral fluid or swelling. No visible canal hematoma. Disc levels:  Mild degenerative changes of the mid cervical spine. Spinal canal is patent. Upper chest: Visualized lung apices are notable for mild biapical pleural-parenchymal scarring. Other: Visualized thyroid is unremarkable. IMPRESSION: No evidence of acute intracranial abnormality. Atrophy with small vessel ischemic changes. No evidence of traumatic injury to the cervical spine. Mild degenerative  changes. Electronically Signed   By: Julian Hy M.D.   On: 08/21/2017 16:39   Ct Cervical Spine Wo Contrast  Result Date: 08/21/2017 CLINICAL DATA:  Fall, head injury, on Eliquis, swelling/ bruising to right head, right eye laceration EXAM: CT HEAD WITHOUT CONTRAST CT CERVICAL SPINE WITHOUT CONTRAST TECHNIQUE: Multidetector CT imaging of the head and cervical spine was performed following the standard protocol without intravenous contrast. Multiplanar CT image reconstructions of the cervical spine were also generated. COMPARISON:  MRI brain dated 07/01/2016.  CT head dated 07/01/2016. FINDINGS: CT HEAD FINDINGS Brain: No evidence of acute infarction, hemorrhage, hydrocephalus, extra-axial collection or mass lesion/mass effect. Subcortical white matter and periventricular small vessel ischemic changes. Mild cortical atrophy. Vascular: Mild intracranial atherosclerosis. Skull: Normal. Negative for fracture or focal lesion. Sinuses/Orbits: The visualized paranasal sinuses are essentially clear. The mastoid air cells  are unopacified. Other: None. CT CERVICAL SPINE FINDINGS Alignment: Normal cervical lordosis. Skull base and vertebrae: No acute fracture. No primary bone lesion or focal pathologic process. Soft tissues and spinal canal: No prevertebral fluid or swelling. No visible canal hematoma. Disc levels:  Mild degenerative changes of the mid cervical spine. Spinal canal is patent. Upper chest: Visualized lung apices are notable for mild biapical pleural-parenchymal scarring. Other: Visualized thyroid is unremarkable. IMPRESSION: No evidence of acute intracranial abnormality. Atrophy with small vessel ischemic changes. No evidence of traumatic injury to the cervical spine. Mild degenerative changes. Electronically Signed   By: Julian Hy M.D.   On: 08/21/2017 16:39    Procedures .Marland KitchenLaceration Repair Date/Time: 08/22/2017 8:45 AM Performed by: Sharlett Iles Authorized by: Sharlett Iles   Consent:    Consent obtained:  Verbal   Consent given by:  Patient   Risks discussed:  Infection and poor cosmetic result   Alternatives discussed:  No treatment Anesthesia (see MAR for exact dosages):    Anesthesia method:  None Laceration details:    Location:  Face   Face location:  R eyebrow   Length (cm):  1.5 Treatment:    Area cleansed with:  Saline   Amount of cleaning:  Standard Skin repair:    Repair method:  Tissue adhesive Approximation:    Approximation:  Close Post-procedure details:    Dressing:  Open (no dressing)   Patient tolerance of procedure:  Tolerated well, no immediate complications   (including critical care time)  Medications Ordered in ED Medications  Tdap (BOOSTRIX) injection 0.5 mL (0.5 mLs Intramuscular Given 08/21/17 1635)     Initial Impression / Assessment and Plan / ED Course  I have reviewed the triage vital signs and the nursing notes.  Pertinent imaging results that were available during my care of the patient were reviewed by me and considered in my medical decision making (see chart for details).    Pt on Eliquis p/w head injury from mechanical fall earlier today. She was well appearing on exam w/ reassuring VS. Normal neuro exam. Obtained CT head and C spine as well as CXR given her complaints. Updated tetanus.   Ct imaging negative acute. CXR with ? Nondisplaced L third posterior rib fx. Pt without respiratory complaints, all pain is anterior and no specific posterior pain complaints. I have discussed supportive measures including scheduled tylenol for pain management for her possible rib fx.  Repaired superficial lac with dermabond, see procedure note. I discussed supportive measures for her wound. Regarding her head injury, I discussed return precautions including vomiting confusion, balance problems, or any other new neurologic symptoms. Patient and daughter voiced understanding and she was discharged in satisfactory  condition.  Final Clinical Impressions(s) / ED Diagnoses   Final diagnoses:  Injury of head, initial encounter  Laceration of scalp, initial encounter    New Prescriptions Discharge Medication List as of 08/21/2017  6:33 PM       Seda Kronberg, Wenda Overland, MD 08/22/17 [phone]

## 2017-09-05 ENCOUNTER — Other Ambulatory Visit: Payer: Self-pay | Admitting: Nurse Practitioner

## 2017-09-05 DIAGNOSIS — I10 Essential (primary) hypertension: Secondary | ICD-10-CM

## 2017-09-05 DIAGNOSIS — I2510 Atherosclerotic heart disease of native coronary artery without angina pectoris: Secondary | ICD-10-CM

## 2017-09-05 NOTE — Telephone Encounter (Signed)
Last refill without being seen 

## 2017-09-07 DIAGNOSIS — D229 Melanocytic nevi, unspecified: Secondary | ICD-10-CM | POA: Diagnosis not present

## 2017-09-07 DIAGNOSIS — L219 Seborrheic dermatitis, unspecified: Secondary | ICD-10-CM | POA: Diagnosis not present

## 2017-09-07 DIAGNOSIS — L570 Actinic keratosis: Secondary | ICD-10-CM | POA: Diagnosis not present

## 2017-09-07 DIAGNOSIS — L821 Other seborrheic keratosis: Secondary | ICD-10-CM | POA: Diagnosis not present

## 2017-09-14 DIAGNOSIS — R1310 Dysphagia, unspecified: Secondary | ICD-10-CM | POA: Diagnosis not present

## 2017-09-14 DIAGNOSIS — K219 Gastro-esophageal reflux disease without esophagitis: Secondary | ICD-10-CM | POA: Diagnosis not present

## 2017-09-14 DIAGNOSIS — L904 Acrodermatitis chronica atrophicans: Secondary | ICD-10-CM | POA: Diagnosis not present

## 2017-09-14 DIAGNOSIS — K3184 Gastroparesis: Secondary | ICD-10-CM | POA: Diagnosis not present

## 2017-10-03 ENCOUNTER — Other Ambulatory Visit: Payer: Self-pay | Admitting: Nurse Practitioner

## 2017-10-03 DIAGNOSIS — I2510 Atherosclerotic heart disease of native coronary artery without angina pectoris: Secondary | ICD-10-CM

## 2017-10-04 NOTE — Telephone Encounter (Signed)
Last seen 2/18  MMM

## 2017-10-12 ENCOUNTER — Ambulatory Visit (INDEPENDENT_AMBULATORY_CARE_PROVIDER_SITE_OTHER): Payer: Medicare Other

## 2017-10-12 DIAGNOSIS — Z23 Encounter for immunization: Secondary | ICD-10-CM | POA: Diagnosis not present

## 2017-10-17 ENCOUNTER — Other Ambulatory Visit: Payer: Self-pay | Admitting: Nurse Practitioner

## 2017-10-17 DIAGNOSIS — I10 Essential (primary) hypertension: Secondary | ICD-10-CM

## 2017-10-17 NOTE — Telephone Encounter (Signed)
Last refill without being seen 

## 2017-10-17 NOTE — Telephone Encounter (Signed)
Last seen 2/18 MMM

## 2017-11-04 ENCOUNTER — Other Ambulatory Visit: Payer: Self-pay | Admitting: Nurse Practitioner

## 2017-11-04 DIAGNOSIS — E039 Hypothyroidism, unspecified: Secondary | ICD-10-CM

## 2017-11-04 DIAGNOSIS — F3342 Major depressive disorder, recurrent, in full remission: Secondary | ICD-10-CM

## 2017-11-04 DIAGNOSIS — I2510 Atherosclerotic heart disease of native coronary artery without angina pectoris: Secondary | ICD-10-CM

## 2017-11-07 NOTE — Telephone Encounter (Signed)
Last refill without being seen 

## 2017-11-11 ENCOUNTER — Ambulatory Visit (INDEPENDENT_AMBULATORY_CARE_PROVIDER_SITE_OTHER): Payer: Medicare Other | Admitting: Nurse Practitioner

## 2017-11-11 ENCOUNTER — Ambulatory Visit (HOSPITAL_COMMUNITY)
Admission: RE | Admit: 2017-11-11 | Discharge: 2017-11-11 | Disposition: A | Discharge status: Home / Self Care | Payer: Medicare Other | Source: Ambulatory Visit | Attending: Nurse Practitioner | Admitting: Nurse Practitioner

## 2017-11-11 ENCOUNTER — Encounter: Payer: Self-pay | Admitting: Nurse Practitioner

## 2017-11-11 DIAGNOSIS — G44319 Acute post-traumatic headache, not intractable: Secondary | ICD-10-CM | POA: Diagnosis not present

## 2017-11-11 DIAGNOSIS — G319 Degenerative disease of nervous system, unspecified: Secondary | ICD-10-CM | POA: Insufficient documentation

## 2017-11-11 DIAGNOSIS — G44309 Post-traumatic headache, unspecified, not intractable: Secondary | ICD-10-CM | POA: Diagnosis not present

## 2017-11-11 DIAGNOSIS — I2510 Atherosclerotic heart disease of native coronary artery without angina pectoris: Secondary | ICD-10-CM

## 2017-11-11 NOTE — Progress Notes (Signed)
   Subjective:    Patient ID: Erica [name], female    DOB: 1930/11/11, 83 y.o.   MRN: [id number]  HPI Patient has had some falling episodes over the last year. She fell last Saturday and when she fell she hit her head on window sill. It did not completely knock her out. Since then she has had an increasing headache. Pain is worse with touching head. She says that she just does not feel right. Denies any blurred vision or confusion. But she says she just does not feel right.    Review of Systems  Constitutional: Negative for chills and fever.  HENT: Negative.   Respiratory: Negative.   Cardiovascular: Negative.   Gastrointestinal: Negative.  Negative for nausea and vomiting.  Neurological: Positive for headaches. Negative for dizziness, speech difficulty and light-headedness.  Psychiatric/Behavioral: Negative.   All other systems reviewed and are negative.      Objective:   Physical Exam  Constitutional: She is oriented to person, place, and time. She appears well-developed and well-nourished. No distress.  Cardiovascular: Normal rate, regular rhythm and normal heart sounds.  Pulmonary/Chest: Effort normal and breath sounds normal.  Neurological: She is alert and oriented to person, place, and time. She has normal reflexes. No cranial nerve deficit.  Skin: Skin is warm.  Psychiatric: She has a normal mood and affect. Her behavior is normal. Judgment and thought content normal.  Follows all commands   BP (!) 144/82   Pulse (!) 55   Temp 97.8 F (36.6 C) (Oral)   Ht 5\' 2"  (1.575 m)   Wt 146 lb (66.2 kg)   BMI 26.70 kg/m       Assessment & Plan:   1. Acute post-traumatic headache, not intractable    Orders Placed This Encounter  Procedures  . CT Head Wo Contrast    Standing Status:   Future    Standing Expiration Date:   02/11/2019    Order Specific Question:   Preferred imaging location?    Answer:   [facility]    Order Specific Question:   Radiology  Contrast Protocol - do NOT remove file path    Answer:   [email or web address]   motrin or tylenol OTC Rest rto prn Will call with ct results  Mary-Margaret Hassell Done, [name]

## 2017-11-14 ENCOUNTER — Telehealth: Payer: Self-pay | Admitting: Nurse Practitioner

## 2017-11-14 NOTE — Telephone Encounter (Signed)
Patient aware of results.

## 2017-11-14 NOTE — Telephone Encounter (Signed)
They were negative for any type of bleed they told me they told her that when she was at hospital

## 2017-11-22 ENCOUNTER — Other Ambulatory Visit: Payer: Self-pay | Admitting: Obstetrics and Gynecology

## 2017-11-22 DIAGNOSIS — Z139 Encounter for screening, unspecified: Secondary | ICD-10-CM

## 2017-11-23 DIAGNOSIS — R3121 Asymptomatic microscopic hematuria: Secondary | ICD-10-CM | POA: Diagnosis not present

## 2017-11-23 DIAGNOSIS — N3941 Urge incontinence: Secondary | ICD-10-CM | POA: Diagnosis not present

## 2017-12-02 ENCOUNTER — Other Ambulatory Visit: Payer: Self-pay | Admitting: Nurse Practitioner

## 2017-12-02 DIAGNOSIS — I2510 Atherosclerotic heart disease of native coronary artery without angina pectoris: Secondary | ICD-10-CM

## 2017-12-02 DIAGNOSIS — I10 Essential (primary) hypertension: Secondary | ICD-10-CM

## 2017-12-21 ENCOUNTER — Ambulatory Visit
Admission: RE | Admit: 2017-12-21 | Discharge: 2017-12-21 | Disposition: A | Discharge status: Home / Self Care | Payer: Medicare Other | Source: Ambulatory Visit | Attending: Obstetrics and Gynecology | Admitting: Obstetrics and Gynecology

## 2017-12-21 ENCOUNTER — Other Ambulatory Visit: Payer: Self-pay | Admitting: Obstetrics and Gynecology

## 2017-12-21 DIAGNOSIS — R928 Other abnormal and inconclusive findings on diagnostic imaging of breast: Secondary | ICD-10-CM | POA: Diagnosis not present

## 2017-12-21 DIAGNOSIS — M79622 Pain in left upper arm: Secondary | ICD-10-CM

## 2017-12-21 DIAGNOSIS — R921 Mammographic calcification found on diagnostic imaging of breast: Secondary | ICD-10-CM

## 2017-12-21 DIAGNOSIS — Z139 Encounter for screening, unspecified: Secondary | ICD-10-CM

## 2017-12-30 ENCOUNTER — Other Ambulatory Visit: Payer: Self-pay | Admitting: Nurse Practitioner

## 2017-12-30 DIAGNOSIS — I2510 Atherosclerotic heart disease of native coronary artery without angina pectoris: Secondary | ICD-10-CM

## 2018-01-05 ENCOUNTER — Other Ambulatory Visit: Payer: Self-pay | Admitting: Nurse Practitioner

## 2018-01-05 DIAGNOSIS — I10 Essential (primary) hypertension: Secondary | ICD-10-CM

## 2018-01-13 ENCOUNTER — Encounter: Payer: Self-pay | Admitting: Family Medicine

## 2018-01-13 ENCOUNTER — Ambulatory Visit (INDEPENDENT_AMBULATORY_CARE_PROVIDER_SITE_OTHER): Payer: Medicare Other | Admitting: Family Medicine

## 2018-01-13 VITALS — BP 169/71 | HR 65 | Temp 97.4°F | Ht 62.0 in | Wt 145.0 lb

## 2018-01-13 DIAGNOSIS — L03011 Cellulitis of right finger: Secondary | ICD-10-CM | POA: Diagnosis not present

## 2018-01-13 DIAGNOSIS — W540XXA Bitten by dog, initial encounter: Secondary | ICD-10-CM | POA: Diagnosis not present

## 2018-01-13 DIAGNOSIS — I2510 Atherosclerotic heart disease of native coronary artery without angina pectoris: Secondary | ICD-10-CM

## 2018-01-13 MED ORDER — AMOXICILLIN-POT CLAVULANATE 875-125 MG PO TABS: 1.0000 | ORAL_TABLET | Freq: Two times a day (BID) | ORAL | 0 refills | Status: DC

## 2018-01-13 MED ORDER — CEFTRIAXONE SODIUM 1 G IJ SOLR
1.0000 g | Freq: Once | INTRAMUSCULAR | Status: AC
  Administered 2018-01-13: 1 g via INTRAMUSCULAR

## 2018-01-13 NOTE — Progress Notes (Signed)
Chief Complaint  Patient presents with  . Animal Bite    pt here today c/o dog bite (pt's dog) and dog is up to date on shots.     HPI  Patient presents today for 2 days ago she was bitten by her dog when she reached down and pressed on the area as it appeared swollen the dog reached around and snapped and bit her on the right index finger.  Today she noticed it becoming more more red swollen and painful on the index finger just proximal to the site of the abrasion.  Patient states the dog is up-to-date on its rabies shot.  PMH: Smoking status noted ROS: Per HPI  Objective: BP (!) 169/71   Pulse 65   Temp (!) 97.4 F (36.3 C) (Oral)   Ht 5\' 2"  (1.575 m)   Wt 145 lb (65.8 kg)   BMI 26.52 kg/m  Gen: NAD, alert, cooperative with exam HEENT: NCAT, EOMI, PERRL CV: RRR, good S1/S2, no murmur Resp: CTABL, no wheezes, non-labored Ext: Moderately severe erythema and edema at the proximal phalanx ventrally of the right second finger. Neuro: Alert and oriented, No gross deficits  Assessment and plan:  1. Dog bite, initial encounter   2. Cellulitis of right index finger     Meds ordered this encounter  Medications  . cefTRIAXone (ROCEPHIN) injection 1 g  . amoxicillin-clavulanate (AUGMENTIN) 875-125 MG tablet    Sig: Take 1 tablet by mouth 2 (two) times daily.    Dispense:  20 tablet    Refill:  0      Follow up in 3 days and as needed should the redness spread  Claretta Fraise, MD

## 2018-02-02 ENCOUNTER — Other Ambulatory Visit: Payer: Self-pay | Admitting: Nurse Practitioner

## 2018-02-02 DIAGNOSIS — F3342 Major depressive disorder, recurrent, in full remission: Secondary | ICD-10-CM

## 2018-02-02 DIAGNOSIS — E039 Hypothyroidism, unspecified: Secondary | ICD-10-CM

## 2018-02-02 NOTE — Telephone Encounter (Signed)
seen 01/13/18  Dr Livia Snellen  MMM PCP  Last thyroid 01/25/17

## 2018-03-07 ENCOUNTER — Other Ambulatory Visit: Payer: Self-pay | Admitting: Nurse Practitioner

## 2018-03-07 DIAGNOSIS — I2510 Atherosclerotic heart disease of native coronary artery without angina pectoris: Secondary | ICD-10-CM

## 2018-03-07 DIAGNOSIS — I10 Essential (primary) hypertension: Secondary | ICD-10-CM

## 2018-03-08 ENCOUNTER — Telehealth: Payer: Self-pay

## 2018-03-08 ENCOUNTER — Ambulatory Visit (INDEPENDENT_AMBULATORY_CARE_PROVIDER_SITE_OTHER): Payer: Medicare Other | Admitting: Internal Medicine

## 2018-03-08 ENCOUNTER — Encounter: Payer: Self-pay | Admitting: Internal Medicine

## 2018-03-08 VITALS — BP 146/60 | HR 56 | Ht 61.5 in | Wt 145.2 lb

## 2018-03-08 DIAGNOSIS — I2510 Atherosclerotic heart disease of native coronary artery without angina pectoris: Secondary | ICD-10-CM | POA: Diagnosis not present

## 2018-03-08 DIAGNOSIS — K648 Other hemorrhoids: Secondary | ICD-10-CM

## 2018-03-08 DIAGNOSIS — K644 Residual hemorrhoidal skin tags: Secondary | ICD-10-CM | POA: Diagnosis not present

## 2018-03-08 DIAGNOSIS — K3184 Gastroparesis: Secondary | ICD-10-CM

## 2018-03-08 DIAGNOSIS — Z7901 Long term (current) use of anticoagulants: Secondary | ICD-10-CM

## 2018-03-08 DIAGNOSIS — R0789 Other chest pain: Secondary | ICD-10-CM

## 2018-03-08 NOTE — Progress Notes (Signed)
Erica [name] 84 y.o. [birth year] [id number]  Assessment & Plan:   Encounter Diagnoses  Name Primary?  . Other chest pain Yes  . Gastroparesis   . Internal and external bleeding hemorrhoids   . Long term current use of anticoagulant     We will go ahead and do an EGD holding Eliquis per protocol as typical.  She understands the extra risk of a stroke off of this medication.  Possible esophageal dilation but only if I see a definite stricture.  Consider changing therapy if we can though it is difficult, she has been on Reglan and had side effects she has been on bethanechol which did not make a difference and I do not think erythromycin did either.  I cannot recall if we have tried to get domperidone I do not see that I think that is problematic with her heart issues.  Reassurance with a negative test may be the best I can do.  If there are inflammatory changes consider sucralfate or other medication changes.  I do not think tricyclic agents given her age cardiac issues and gastric emptying problems are in her best interest.   Regarding hemorrhoid she will continue her stool softener and try to avoid constipation.  I do not think any further evaluation is needed She was not interested in hydrocortisone cream therapy.  She claimed that I told her not to come back, but I clarified that I am always willing to try to help her but my therapeutic  options are very limited at this point.  I appreciate the opportunity to care for this patient. CC: [name], [name]  Subjective:   Chief Complaint: Chest pain rectal bleeding  HPI The patient is here for follow-up, she has gastroparesis and reflux issues and bleeding hemorrhoids known in the past.  I last saw her in 2017.  She had a negative EGD and a colonoscopy with polyps and hemorrhoids in 2015.  She is reporting problems with a steady pressure in her chest during eating and lying down disrupting sleep as in the past but worse  she says.  She is very concerned because a family member I think her father had esophageal cancer and she wants to get checked.  She also has known esophageal dysmotility from barium swallow testing in 2016, with persistent strangling on liquids at times.  Lately when she is constipated and straining perhaps once or twice a week she will see a little bit of bright red blood on the tissue paper.  She had seen in the [place] gastroenterologist last year, and they were going to do an EGD after seeing her in the fall but she did not have that done.  She takes Eliquis for atrial fibrillation.  Her weights are stable her appetite is okay she is just very concerned over the symptoms and wants reassurance that there is nothing different or new or bad going on.  She is compliant with lifestyle changes including not eating late and elevating the head of her bed.  She is on twice daily Zegerid before breakfast and bedtime.  Wt Readings from Last 3 Encounters:  03/08/18 145 lb 4 oz (65.9 kg)  01/13/18 145 lb (65.8 kg)  11/11/17 146 lb (66.2 kg)     Allergies  Allergen Reactions  . Contrast Media [Iodinated Diagnostic Agents] Shortness Of Breath, Nausea Only and Swelling  . Iodine Shortness Of Breath, Nausea Only and Swelling  . Iohexol Hives and Swelling  Desc: hives, swelling over 5 yrs ago-pt has never been pre-medicated--was told to not have iv contrast ever   . Ioxaglate Nausea Only, Shortness Of Breath and Swelling  . Metoclopramide Other (See Comments)    Mouth tremors, insomnia, and irritability   Current Meds  Medication Sig  . amiodarone (PACERONE) 200 MG tablet TAKE 1 TABLET DAILY  . amLODipine (NORVASC) 5 MG tablet TAKE 1 TABLET DAILY  . apixaban (ELIQUIS) 2.5 MG TABS tablet Take 1 tablet (2.5 mg total) by mouth 2 (two) times daily.  . Cholecalciferol (VITAMIN D PO) Take 1 tablet by mouth daily.  Marland Kitchen escitalopram (LEXAPRO) 20 MG tablet TAKE (2) TABLETS DAILY  . EVENING PRIMROSE OIL PO  Take 1 capsule by mouth daily.  . furosemide (LASIX) 20 MG tablet TAKE 1 TABLET ONCE DAILY AS NEEDED FOR EDEMA OR FLUID  . levothyroxine (SYNTHROID, LEVOTHROID) 88 MCG tablet TAKE (1) TABLET DAILY BE- FORE BREAKFAST.  . Melatonin 5 MG CAPS Take 5 mg by mouth at bedtime as needed (sleep).  . Multiple Vitamin (MULTIVITAMIN WITH MINERALS) TABS tablet Take 1 tablet by mouth daily.  Marland Kitchen olmesartan (BENICAR) 40 MG tablet TAKE 1 TABLET DAILY  . Omega-3 Fatty Acids (FISH OIL PO) Take 1 tablet by mouth daily.  Marland Kitchen omeprazole-sodium bicarbonate (ZEGERID) 40-1100 MG capsule TAKE 1 CAPSULE BY MOUTH TWICE A DAY BEFORE BREAKFAST AND AT BEDTIME   Past Medical History:  Diagnosis Date  . Abnormality of gait 08/25/2016  . Adenomatous rectal polyp   . Atrial fibrillation ([place])   . Bleeding internal hemorrhoids   . Chronic back pain   . Diverticulosis of colon (without mention of hemorrhage)   . Gastroparesis   . GERD (gastroesophageal reflux disease)   . Hiatal hernia 09/1999   3cm  . Hyperlipidemia   . Hypertension   . Hypothyroid   . Ischemic colitis ([place])   . Left foot drop 08/25/2016  . Lichen sclerosus et atrophicus of the vulva   . MVP (mitral valve prolapse)   . TIA (transient ischemic attack)   . Weakness    Past Surgical History:  Procedure Laterality Date  . CARDIAC CATHETERIZATION N/A 02/03/2016   Procedure: Coronary/Graft Angiography;  Surgeon: Charolette Forward, MD;  Location: [facility];  Service: Cardiovascular;  Laterality: N/A;  . CATARACT EXTRACTION    . CHOLECYSTECTOMY    . COLONOSCOPY     Multiple, polyps  . ESOPHAGEAL MANOMETRY N/A 06/14/2016   Procedure: ESOPHAGEAL MANOMETRY (EM);  Surgeon: Gatha Mayer, MD;  Location: [facility];  Service: Endoscopy;  Laterality: N/A;  . LEFT HEART CATHETERIZATION WITH CORONARY ANGIOGRAM N/A 07/17/2013   Procedure: LEFT HEART CATHETERIZATION WITH CORONARY ANGIOGRAM;  Surgeon: Clent Demark, MD;  Location: [facility];  Service:  Cardiovascular;  Laterality: N/A;  . SIGMOIDOSCOPY  08/23/2011  . TUBAL LIGATION    . UPPER GASTROINTESTINAL ENDOSCOPY  08/23/2011   Normal   Social History   Social History Narrative   Lives at home alone.   Right-handed.   2 cups caffeine most days.   family history includes Colon cancer in her paternal aunt; Esophageal cancer in her father; Lung cancer in her brother; Stomach cancer in her brother; Stroke in her mother.   Review of Systems As per HPI.  Significant health related anxiety.  Objective:   Physical Exam @BP  (!) 146/60 (BP Location: Left Arm, Patient Position: Sitting, Cuff Size: Normal)   Pulse (!) 56   Ht 5' 1.5" (1.562 m)  Wt 145 lb 4 oz (65.9 kg)   BMI 27.00 kg/m @  General:  NAD Eyes:   anicteric Lungs:  clear Heart::  S1S2 no rubs, murmurs or gallops Abdomen:  soft and nontender, BS+ Ext:   no edema, cyanosis or clubbing  Rectal exam with Jackolyn Confer CMA present reveals normal anoderm no mass nontender formed brown stool present.  Anoscopy is also performed in the presence of Ms. Davis and reveals inflamed external hemorrhoids and small internal hemorrhoids in all 3 positions grade 2.    Data Reviewed:  See HPI I have reviewed GI records from Dr. Fraser Din while from September 2018 and September 2017

## 2018-03-08 NOTE — Patient Instructions (Signed)
You have been scheduled for an endoscopy. Please follow written instructions given to you at your visit today. If you use inhalers (even only as needed), please bring them with you on the day of your procedure.   You will be contaced by our office prior to your procedure for directions on holding your Eliquis.  If you do not hear from our office 1 week prior to your scheduled procedure, please call [phone] to discuss.   I appreciate the opportunity to care for you. Carl Gessner, MD, FACG  

## 2018-03-08 NOTE — Telephone Encounter (Signed)
[facility] [address] [address]   03/08/2018   RE: Erica [name] DOB: 02/21/1930 MRN: [id number]   Dear Dr Terrence Dupont,    We have scheduled the above patient for an endoscopic procedure. Our records show that she is on anticoagulation therapy.   Please advise as to how long the patient may come off her therapy of Eliquis prior to the EGD procedure, which is scheduled for 04/03/18.  Please fax back/ or route the completed form to Enrico Eaddy [place], [place] at [phone].   Sincerely,    Silvano Rusk, MD, [facility]

## 2018-03-10 NOTE — Telephone Encounter (Signed)
This note was faxed to Dr Terrence Dupont and he hand wrote an answer on the letter and faxed it back to [place] with clearance to hold the Eliquis for 2 days prior to her upcoming EGD. I spoke with Polly and informed her , she verbalized understanding. I will send myself a staff message to call her on 03/31/18 to remind her as well.

## 2018-03-29 ENCOUNTER — Ambulatory Visit (INDEPENDENT_AMBULATORY_CARE_PROVIDER_SITE_OTHER): Payer: Medicare Other | Admitting: Family Medicine

## 2018-03-29 ENCOUNTER — Other Ambulatory Visit: Payer: Self-pay | Admitting: Family Medicine

## 2018-03-29 ENCOUNTER — Encounter: Payer: Self-pay | Admitting: Family Medicine

## 2018-03-29 VITALS — BP 129/64 | HR 54 | Temp 98.9°F | Ht 61.0 in | Wt 144.0 lb

## 2018-03-29 DIAGNOSIS — L304 Erythema intertrigo: Secondary | ICD-10-CM | POA: Diagnosis not present

## 2018-03-29 DIAGNOSIS — L989 Disorder of the skin and subcutaneous tissue, unspecified: Secondary | ICD-10-CM | POA: Diagnosis not present

## 2018-03-29 DIAGNOSIS — L820 Inflamed seborrheic keratosis: Secondary | ICD-10-CM | POA: Diagnosis not present

## 2018-03-29 DIAGNOSIS — L821 Other seborrheic keratosis: Secondary | ICD-10-CM | POA: Diagnosis not present

## 2018-03-29 MED ORDER — NYSTATIN 100000 UNIT/GM EX CREA: 1.0000 "application " | TOPICAL_CREAM | Freq: Two times a day (BID) | CUTANEOUS | 0 refills | Status: DC

## 2018-03-29 NOTE — Progress Notes (Signed)
Subjective: CC: Rash PCP: [name], [name] [id number] Erica [name] is a 84 y.o. female presenting to clinic today for:  1. Rash Patient notes that she has had an intermittent rash in bilateral inguinal folds that seems to be worse in the summer.  She describes the rash as itchy and sometimes tender after scratching a lot.  Denies exudate, bleeding.  No fevers, chills.  She has been applying Vaseline in efforts to improve rash.  2.  New skin lesion Patient also notes a "white dots" that she appreciated about 2 months ago near the left side of her inguinal fold.  She worries that this is skin cancer and would like it to be removed.   ROS: Per HPI  Allergies  Allergen Reactions  . Contrast Media [Iodinated Diagnostic Agents] Shortness Of Breath, Nausea Only and Swelling  . Iodine Shortness Of Breath, Nausea Only and Swelling  . Iohexol Hives and Swelling     Desc: hives, swelling over 5 yrs ago-pt has never been pre-medicated--was told to not have iv contrast ever   . Ioxaglate Nausea Only, Shortness Of Breath and Swelling  . Metoclopramide Other (See Comments)    Mouth tremors, insomnia, and irritability   Past Medical History:  Diagnosis Date  . Abnormality of gait 08/25/2016  . Adenomatous rectal polyp   . Atrial fibrillation ([place])   . Bleeding internal hemorrhoids   . Chronic back pain   . Diverticulosis of colon (without mention of hemorrhage)   . Gastroparesis   . GERD (gastroesophageal reflux disease)   . Hiatal hernia 09/1999   3cm  . Hyperlipidemia   . Hypertension   . Hypothyroid   . Ischemic colitis ([place])   . Left foot drop 08/25/2016  . Lichen sclerosus et atrophicus of the vulva   . MVP (mitral valve prolapse)   . TIA (transient ischemic attack)   . Weakness     Current Outpatient Medications:  .  amiodarone (PACERONE) 200 MG tablet, TAKE 1 TABLET DAILY, Disp: 90 tablet, Rfl: 0 .  amLODipine (NORVASC) 5 MG tablet, TAKE 1 TABLET DAILY, Disp: 90  tablet, Rfl: 0 .  apixaban (ELIQUIS) 2.5 MG TABS tablet, Take 1 tablet (2.5 mg total) by mouth 2 (two) times daily., Disp: 60 tablet, Rfl: 3 .  Cholecalciferol (VITAMIN D PO), Take 1 tablet by mouth daily., Disp: , Rfl:  .  escitalopram (LEXAPRO) 20 MG tablet, TAKE (2) TABLETS DAILY, Disp: 180 tablet, Rfl: 0 .  EVENING PRIMROSE OIL PO, Take 1 capsule by mouth daily., Disp: , Rfl:  .  furosemide (LASIX) 20 MG tablet, TAKE 1 TABLET ONCE DAILY AS NEEDED FOR EDEMA OR FLUID, Disp: 90 tablet, Rfl: 0 .  levothyroxine (SYNTHROID, LEVOTHROID) 88 MCG tablet, TAKE (1) TABLET DAILY BE- FORE BREAKFAST., Disp: 90 tablet, Rfl: 0 .  Melatonin 5 MG CAPS, Take 5 mg by mouth at bedtime as needed (sleep)., Disp: , Rfl:  .  Multiple Vitamin (MULTIVITAMIN WITH MINERALS) TABS tablet, Take 1 tablet by mouth daily., Disp: , Rfl:  .  olmesartan (BENICAR) 40 MG tablet, TAKE 1 TABLET DAILY, Disp: 90 tablet, Rfl: 0 .  Omega-3 Fatty Acids (FISH OIL PO), Take 1 tablet by mouth daily., Disp: , Rfl:  .  omeprazole-sodium bicarbonate (ZEGERID) 40-1100 MG capsule, TAKE 1 CAPSULE BY MOUTH TWICE A DAY BEFORE BREAKFAST AND AT BEDTIME, Disp: 180 capsule, Rfl: 1 Social History   Socioeconomic History  . Marital status: Widowed    Spouse name: Not on file  .  Number of children: 17  . Years of education: Not on file  . Highest education level: Not on file  Occupational History  . Occupation: retired    Fish farm manager: RETIRED  Social Needs  . Financial resource strain: Not on file  . Food insecurity:    Worry: Not on file    Inability: Not on file  . Transportation needs:    Medical: Not on file    Non-medical: Not on file  Tobacco Use  . Smoking status: Never Smoker  . Smokeless tobacco: Never Used  Substance and Sexual Activity  . Alcohol use: No  . Drug use: No  . Sexual activity: Not on file  Lifestyle  . Physical activity:    Days per week: Not on file    Minutes per session: Not on file  . Stress: Not on file    Relationships  . Social connections:    Talks on phone: Not on file    Gets together: Not on file    Attends religious service: Not on file    Active member of club or organization: Not on file    Attends meetings of clubs or organizations: Not on file    Relationship status: Not on file  . Intimate partner violence:    Fear of current or ex partner: Not on file    Emotionally abused: Not on file    Physically abused: Not on file    Forced sexual activity: Not on file  Other Topics Concern  . Not on file  Social History Narrative   Lives at home alone.   Right-handed.   2 cups caffeine most days.   Family History  Problem Relation Age of Onset  . Stomach cancer Brother   . Colon cancer Paternal Aunt   . Lung cancer Brother        smoker  . Esophageal cancer Father   . Stroke Mother     Objective: Office vital signs reviewed. BP 129/64   Pulse (!) 54   Temp 98.9 F (37.2 C) (Oral)   Ht 5\' 1"  (1.549 m)   Wt 144 lb (65.3 kg)   BMI 27.21 kg/m   Physical Examination:  General: Awake, alert, well nourished, No acute distress Skin: Shiny, blanching erythematous rash appreciated in the left inguinal fold above the level of the vagina.  There is no skin breakdown, exudate, induration or purulence noted.  She has a 1 cm x 0.5 cm verrucous lesion that is flesh/white colored at the apex of her rash.  Skin Biopsy Procedure: Patient denies allergy to lidocaine.  She has an iodine allergy so chlorhexidine was used for this procedure.  She is currently anticoagulated with Eliquis.  Risks and benefits of procedure were reviewed with the patient.  Written consent scanned into chart.  Area of concern located on left inguinal fold.  Area was cleaned with alcohol swabs x2.  Local anesthesia was achieved by injecting 2 cc of lidocaine 1% with epinephrine.  The area was then cleaned with chlorhexidine swabs x2.  Dermablade was used to biopsy lesion.  Estimated blood loss <1cc.  Hemostasis  achieved with pressure and silver nitrate.  Area was cleaned, and a clean bandage was applied.  Patient tolerated procedure well and there were no immediate complications.  Home care instructions were reviewed with the patient.   Assessment/ Plan: 84 y.o. female   1. Skin lesion I suspect that this is a benign seborrheic keratosis.  However, patient voiced great concern given  area of lesion, as she does have a history of lichen sclerosis of the vagina.  Because she is having irritation and tenderness over this area, it was removed via shave biopsy today.  See above procedure note.  I have sent this for pathology.  I will contact her once this is available.  Patient tolerated procedure well.  Home care instructions reviewed with patient.  Reasons for return evaluation discussed.  She voiced understanding and will follow-up as needed. - Pathology  2. Pruritic intertrigo Rash appears to be consistent with intertrigo.  I prescribed nystatin cream to use twice a day for the next 7 days.  I recommended the patient avoid the area of biopsy if possible so as not to irritate the surgical site.  She was good understanding will follow-up as needed.  Meds ordered this encounter  Medications  . nystatin cream (MYCOSTATIN)    Sig: Apply 1 application topically 2 (two) times daily. x7 days    Dispense:  30 g    Refill:  0     Ashly Windell Moulding, DO [place] [phone]

## 2018-03-29 NOTE — Patient Instructions (Signed)
I think that the spot we biopsied today is a seborrheic keratosis.  However, we will send it off to pathology to confirm this.  Concerning the red rash that you have in the groin area, I think this is intertrigo.  I have prescribed you a cream to use twice a day on the red area only for the next 7 days.  Try to keep this area as dry as possible.  I provided you information about both the seborrheic keratosis and intertrigo as below.   Skin Biopsy, Care After Refer to this sheet in the next few weeks. These instructions provide you with information about caring for yourself after your procedure. Your health care provider may also give you more specific instructions. Your treatment has been planned according to current medical practices, but problems sometimes occur. Call your health care provider if you have any problems or questions after your procedure. What can I expect after the procedure? After the procedure, it is common to have:  Soreness.  Bruising.  Itching.  Follow these instructions at home:  Rest and then return to your normal activities as told by your health care provider.  Take over-the-counter and prescription medicines only as told by your health care provider.  Follow instructions from your health care provider about how to take care of your biopsy site.Make sure you: ? Wash your hands with soap and water before you change your bandage (dressing). If soap and water are not available, use hand sanitizer. ? Change your dressing as told by your health care provider. ? Leave stitches (sutures), skin glue, or adhesive strips in place. These skin closures may need to stay in place for 2 weeks or longer. If adhesive strip edges start to loosen and curl up, you may trim the loose edges. Do not remove adhesive strips completely unless your health care provider tells you to do that. If the biopsy area bleeds, apply gentle pressure for 10 minutes.  Check your biopsy site every day  for signs of infection. Check for: ? More redness, swelling, or pain. ? More fluid or blood. ? Warmth. ? Pus or a bad smell.  Keep all follow-up visits as told by your health care provider. This is important. Contact a health care provider if:  You have more redness, swelling, or pain around your biopsy site.  You have more fluid or blood coming from your biopsy site.  Your biopsy site feels warm to the touch.  You have pus or a bad smell coming from your biopsy site.  You have a fever. Get help right away if:  You have bleeding that does not stop with pressure or a dressing. This information is not intended to replace advice given to you by your health care provider. Make sure you discuss any questions you have with your health care provider. Document Released: 01/09/2016 Document Revised: 08/08/2016 Document Reviewed: 03/12/2015 Elsevier Interactive Patient Education  2018 [place]. Seborrheic Keratosis Seborrheic keratosis is a common, noncancerous (benign) skin growth. This condition causes waxy, rough, tan, brown, or black spots to appear on the skin. These skin growths can be flat or raised. What are the causes? The cause of this condition is not known. What increases the risk? This condition is more likely to develop in:  People who have a family history of seborrheic keratosis.  People who are 41 or older.  People who are pregnant.  People who have had estrogen replacement therapy.  What are the signs or symptoms? This condition  often occurs on the face, chest, shoulders, back, or other areas. These growths:  Are usually painless, but may become irritated and itchy.  Can be yellow, brown, black, or other colors.  Are slightly raised or have a flat surface.  Are sometimes rough or wart-like in texture.  Are often waxy on the surface.  Are round or oval-shaped.  Sometimes look like they are "stuck on."  Often occur in groups, but may occur as a  single growth.  How is this diagnosed? This condition is diagnosed with a medical history and physical exam. A sample of the growth may be tested (skin biopsy). You may need to see a skin specialist (dermatologist). How is this treated? Treatment is not usually needed for this condition, unless the growths are irritated or are often bleeding. You may also choose to have the growths removed if you do not like their appearance. Most commonly, these growths are treated with a procedure in which liquid nitrogen is applied to "freeze" off the growth (cryosurgery). They may also be burned off with electricity or cut off. Follow these instructions at home:  Watch your growth for any changes.  Keep all follow-up visits as told by your health care provider. This is important.  Do not scratch or pick at the growth or growths. This can cause them to become irritated or infected. Contact a health care provider if:  You suddenly have many new growths.  Your growth bleeds, itches, or hurts.  Your growth suddenly becomes larger or changes color. This information is not intended to replace advice given to you by your health care provider. Make sure you discuss any questions you have with your health care provider. Document Released: 01/15/2011 Document Revised: 05/20/2016 Document Reviewed: 04/30/2015 Elsevier Interactive Patient Education  2018 [place] is skin irritation (inflammation) that happens in warm, moist areas of the body. The irritation can cause a rash and make skin raw and itchy. The rash is usually pink or red. It happens mostly between folds of skin or where skin rubs together, such as:  Toes.  Armpits.  Groin.  Belly.  Breasts.  Buttocks.  This condition is not passed from person to person (is not contagious). Follow these instructions at home:  Keep the affected area clean and dry.  Do not scratch your skin.  Stay cool as much as possible.  Use an air conditioner or fan, if you can.  Apply over-the-counter and prescription medicines only as told by your doctor.  If you were prescribed an antibiotic medicine, use it as told by your doctor. Do not stop using the antibiotic even if your condition starts to get better.  Keep all follow-up visits as told by your doctor. This is important. How is this prevented?  Stay at a healthy weight.  Keep your feet dry. This is very important if you have diabetes. Wear cotton or wool socks.  Take care of and protect the skin in your groin and butt area as told by your doctor.  Do not wear tight clothes. Wear clothes that: ? Are loose. ? Take away moisture from your body. ? Are made of cotton.  Wear a bra that gives good support, if needed.  Shower and dry yourself fully after being active.  Keep your blood sugar under control if you have diabetes. Contact a doctor if:  Your symptoms do not get better with treatment.  Your symptoms get worse or they spread.  You notice more redness and  warmth.  You have a fever. This information is not intended to replace advice given to you by your health care provider. Make sure you discuss any questions you have with your health care provider. Document Released: 01/15/2011 Document Revised: 05/20/2016 Document Reviewed: 06/16/2015 Elsevier Interactive Patient Education  [employer].

## 2018-03-31 ENCOUNTER — Telehealth: Payer: Self-pay

## 2018-03-31 ENCOUNTER — Ambulatory Visit: Payer: Medicare Other | Admitting: Nurse Practitioner

## 2018-03-31 DIAGNOSIS — I10 Essential (primary) hypertension: Secondary | ICD-10-CM | POA: Diagnosis not present

## 2018-03-31 DIAGNOSIS — I480 Paroxysmal atrial fibrillation: Secondary | ICD-10-CM | POA: Diagnosis not present

## 2018-03-31 DIAGNOSIS — E785 Hyperlipidemia, unspecified: Secondary | ICD-10-CM | POA: Diagnosis not present

## 2018-03-31 DIAGNOSIS — I2510 Atherosclerotic heart disease of native coronary artery without angina pectoris: Secondary | ICD-10-CM | POA: Diagnosis not present

## 2018-03-31 DIAGNOSIS — F419 Anxiety disorder, unspecified: Secondary | ICD-10-CM | POA: Diagnosis not present

## 2018-03-31 DIAGNOSIS — R002 Palpitations: Secondary | ICD-10-CM | POA: Diagnosis not present

## 2018-03-31 DIAGNOSIS — E039 Hypothyroidism, unspecified: Secondary | ICD-10-CM | POA: Diagnosis not present

## 2018-03-31 DIAGNOSIS — M1990 Unspecified osteoarthritis, unspecified site: Secondary | ICD-10-CM | POA: Diagnosis not present

## 2018-03-31 LAB — PATHOLOGY

## 2018-03-31 NOTE — Telephone Encounter (Signed)
Reminded Polly about holding her Eliquis on Sat. And Sunday.

## 2018-03-31 NOTE — Telephone Encounter (Signed)
-----   Message from Pranathi Winfree E [place], [place] sent at 03/10/2018 11:32 AM EDT ----- Call and remind Erica [name] to stop her Eliquis after Friday's dosing, none Sat or Sun, EGD on Monday with Dr Carlean Purl.

## 2018-04-03 ENCOUNTER — Other Ambulatory Visit: Payer: Self-pay | Admitting: Nurse Practitioner

## 2018-04-03 ENCOUNTER — Other Ambulatory Visit: Payer: Self-pay

## 2018-04-03 ENCOUNTER — Ambulatory Visit (AMBULATORY_SURGERY_CENTER): Payer: Medicare Other | Admitting: Internal Medicine

## 2018-04-03 ENCOUNTER — Encounter: Payer: Self-pay | Admitting: Internal Medicine

## 2018-04-03 VITALS — BP 166/60 | HR 57 | Temp 97.7°F | Resp 11 | Ht 61.0 in | Wt 144.0 lb

## 2018-04-03 DIAGNOSIS — K222 Esophageal obstruction: Secondary | ICD-10-CM | POA: Diagnosis not present

## 2018-04-03 DIAGNOSIS — I2510 Atherosclerotic heart disease of native coronary artery without angina pectoris: Secondary | ICD-10-CM

## 2018-04-03 DIAGNOSIS — R0789 Other chest pain: Secondary | ICD-10-CM | POA: Diagnosis not present

## 2018-04-03 DIAGNOSIS — R079 Chest pain, unspecified: Secondary | ICD-10-CM | POA: Diagnosis not present

## 2018-04-03 DIAGNOSIS — I4891 Unspecified atrial fibrillation: Secondary | ICD-10-CM | POA: Diagnosis not present

## 2018-04-03 DIAGNOSIS — Z8673 Personal history of transient ischemic attack (TIA), and cerebral infarction without residual deficits: Secondary | ICD-10-CM | POA: Diagnosis not present

## 2018-04-03 DIAGNOSIS — I10 Essential (primary) hypertension: Secondary | ICD-10-CM | POA: Diagnosis not present

## 2018-04-03 DIAGNOSIS — K317 Polyp of stomach and duodenum: Secondary | ICD-10-CM

## 2018-04-03 DIAGNOSIS — M1990 Unspecified osteoarthritis, unspecified site: Secondary | ICD-10-CM

## 2018-04-03 HISTORY — DX: Unspecified osteoarthritis, unspecified site: M19.90

## 2018-04-03 MED ORDER — SODIUM CHLORIDE 0.9 % IV SOLN: 500.0000 mL | Freq: Once | INTRAVENOUS | Status: DC

## 2018-04-03 NOTE — Patient Instructions (Addendum)
   There was a tiny stomach polyp - removed.  Does not look like cancer but I will prove that.  There was partial narrowing of the esophagus and I dilated that.  Restart Eliquis tomorrow.  I appreciate the opportunity to care for you. Gatha Mayer, MD, [place]  **Handout given on Post Dilation diet**  YOU HAD AN ENDOSCOPIC PROCEDURE TODAY: Refer to the procedure report and other information in the discharge instructions given to you for any specific questions about what was found during the examination. If this information does not answer your questions, please call [place] office at [phone] to clarify.   YOU SHOULD EXPECT: Some feelings of bloating in the abdomen. Passage of more gas than usual. Walking can help get rid of the air that was put into your GI tract during the procedure and reduce the bloating. If you had a lower endoscopy (such as a colonoscopy or flexible sigmoidoscopy) you may notice spotting of blood in your stool or on the toilet paper. Some abdominal soreness may be present for a day or two, also.  DIET: Your first meal following the procedure should be a light meal and then it is ok to progress to your normal diet. A half-sandwich or bowl of soup is an example of a good first meal. Heavy or fried foods are harder to digest and may make you feel nauseous or bloated. Drink plenty of fluids but you should avoid alcoholic beverages for 24 hours. If you had a esophageal dilation, please see attached instructions for diet.    ACTIVITY: Your care partner should take you home directly after the procedure. You should plan to take it easy, moving slowly for the rest of the day. You can resume normal activity the day after the procedure however YOU SHOULD NOT DRIVE, use power tools, machinery or perform tasks that involve climbing or major physical exertion for 24 hours (because of the sedation medicines used during the test).   SYMPTOMS TO REPORT IMMEDIATELY: A  gastroenterologist can be reached at any hour. Please call [phone]  for any of the following symptoms:   Following upper endoscopy (EGD, EUS, ERCP, esophageal dilation) Vomiting of blood or coffee ground material  New, significant abdominal pain  New, significant chest pain or pain under the shoulder blades  Painful or persistently difficult swallowing  New shortness of breath  Black, tarry-looking or red, bloody stools  FOLLOW UP:  If any biopsies were taken you will be contacted by phone or by letter within the next 1-3 weeks. Call [phone]  if you have not heard about the biopsies in 3 weeks.  Please also call with any specific questions about appointments or follow up tests.

## 2018-04-03 NOTE — Op Note (Signed)
[name] Patient Name: Erica [name] Procedure Date: 04/03/2018 3:07 PM MRN: [id number] Endoscopist: Gatha Mayer , MD Age: 84 Referring MD:  Date of Birth: 1930/03/17 Gender: Female Account #: [id number] Procedure:                Upper GI endoscopy Indications:              Esophageal reflux, Unexplained chest pain,                            Regurgitation vs dysphagia Medicines:                Propofol per Anesthesia, Monitored Anesthesia Care Procedure:                Pre-Anesthesia Assessment:                           - Prior to the procedure, a History and Physical                            was performed, and patient medications and                            allergies were reviewed. The patient's tolerance of                            previous anesthesia was also reviewed. The risks                            and benefits of the procedure and the sedation                            options and risks were discussed with the patient.                            All questions were answered, and informed consent                            was obtained. Prior Anticoagulants: The patient                            last took Eliquis (apixaban) 2 days prior to the                            procedure. ASA Grade Assessment: III - A patient                            with severe systemic disease. After reviewing the                            risks and benefits, the patient was deemed in                            satisfactory condition to undergo the procedure.  After obtaining informed consent, the endoscope was                            passed under direct vision. Throughout the                            procedure, the patient's blood pressure, pulse, and                            oxygen saturations were monitored continuously. The                            Endoscope was introduced through the mouth, and                            advanced  to the second part of duodenum. The upper                            GI endoscopy was accomplished without difficulty.                            The patient tolerated the procedure well. Scope In: Scope Out: Findings:                 The examined esophagus was moderately tortuous.                           A mild Schatzki ring (partial) was found at the                            gastroesophageal junction. The scope was withdrawn.                            Dilation was performed with a Maloney dilator with                            no resistance at 31 Fr. Estimated blood loss: none.                           A single diminutive sessile polyp with no bleeding                            and no stigmata of recent bleeding was found in the                            cardia. The polyp was removed with a cold biopsy                            forceps. Resection and retrieval were complete.                            Verification of patient identification for the  specimen was done. Estimated blood loss was minimal.                           A small hiatal hernia was present.1-2 cm                           The exam was otherwise without abnormality.                           The cardia and gastric fundus were otherwise normal                            on retroflexion. Complications:            No immediate complications. Estimated Blood Loss:     Estimated blood loss was minimal. Impression:               - Tortuous esophagus.                           - Mild Schatzki (partial) ring. Dilated.                           - A single gastric polyp. Resected and retrieved.                           - Small hiatal hernia.                           - The examination was otherwise normal. Recommendation:           - Patient has a contact number available for                            emergencies. The signs and symptoms of potential                            delayed  complications were discussed with the                            patient. Return to normal activities tomorrow.                            Written discharge instructions were provided to the                            patient.                           - Clear liquids x 1 hour then soft foods rest of                            day. Start prior diet tomorrow.                           - Continue present medications.                           -  Await pathology results.                           - Resume Eliquis (apixaban) at prior dose tomorrow.                           - Await pathology results. Gatha Mayer, MD 04/03/2018 3:31:55 PM This report has been signed electronically.

## 2018-04-03 NOTE — Progress Notes (Signed)
Report given to PACU, vss 

## 2018-04-03 NOTE — Progress Notes (Signed)
History reviewed today 

## 2018-04-03 NOTE — Progress Notes (Signed)
Called to room to assist during endoscopic procedure.  Patient ID and intended procedure confirmed with present staff. Received instructions for my participation in the procedure from the performing physician.  

## 2018-04-04 ENCOUNTER — Telehealth: Payer: Self-pay

## 2018-04-04 ENCOUNTER — Encounter: Payer: Self-pay | Admitting: Nurse Practitioner

## 2018-04-04 ENCOUNTER — Telehealth: Payer: Self-pay | Admitting: *Deleted

## 2018-04-04 NOTE — Telephone Encounter (Signed)
NO ANSWER, MESSAGE LEFT FOR PATIENT. 

## 2018-04-04 NOTE — Telephone Encounter (Signed)
  Follow up Call-  Call back number 04/03/2018  Post procedure Call Back phone  # [phone]  Permission to leave phone message Yes  Some recent data might be hidden     Patient questions:  Do you have a fever, pain , or abdominal swelling? No. Pain Score  0 *  Have you tolerated food without any problems? Yes.    Have you been able to return to your normal activities? Yes.    Do you have any questions about your discharge instructions: Diet   No. Medications  No. Follow up visit  No.  Do you have questions or concerns about your Care? No.  Actions: * If pain score is 4 or above: No action needed, pain <4.

## 2018-04-11 NOTE — Progress Notes (Signed)
[facility] - no letter/recall  Office  Call - polyp benign - no f/u needed for that  Did dilation help her? Sx update please

## 2018-04-12 NOTE — Progress Notes (Signed)
I recommend:  1) Try IB Gard 2 tabs before breakfast and supper 2) 1 tablespoon Benefiber nightly 3) OV me next avail

## 2018-04-24 DIAGNOSIS — I10 Essential (primary) hypertension: Secondary | ICD-10-CM | POA: Diagnosis not present

## 2018-04-24 DIAGNOSIS — E785 Hyperlipidemia, unspecified: Secondary | ICD-10-CM | POA: Diagnosis not present

## 2018-04-24 DIAGNOSIS — I480 Paroxysmal atrial fibrillation: Secondary | ICD-10-CM | POA: Diagnosis not present

## 2018-04-25 NOTE — Progress Notes (Signed)
Message left for her to call back if recent recommendations not helping

## 2018-05-03 ENCOUNTER — Other Ambulatory Visit: Payer: Self-pay | Admitting: Nurse Practitioner

## 2018-05-03 DIAGNOSIS — I2510 Atherosclerotic heart disease of native coronary artery without angina pectoris: Secondary | ICD-10-CM

## 2018-05-03 DIAGNOSIS — F3342 Major depressive disorder, recurrent, in full remission: Secondary | ICD-10-CM

## 2018-05-03 DIAGNOSIS — E039 Hypothyroidism, unspecified: Secondary | ICD-10-CM

## 2018-05-04 NOTE — Telephone Encounter (Signed)
OV 05/05/18

## 2018-05-05 ENCOUNTER — Encounter: Payer: Self-pay | Admitting: Nurse Practitioner

## 2018-05-05 ENCOUNTER — Ambulatory Visit (INDEPENDENT_AMBULATORY_CARE_PROVIDER_SITE_OTHER): Payer: Medicare Other | Admitting: Nurse Practitioner

## 2018-05-05 VITALS — BP 130/66 | HR 58 | Temp 98.2°F | Ht 61.0 in | Wt 142.0 lb

## 2018-05-05 DIAGNOSIS — I10 Essential (primary) hypertension: Secondary | ICD-10-CM | POA: Diagnosis not present

## 2018-05-05 DIAGNOSIS — E039 Hypothyroidism, unspecified: Secondary | ICD-10-CM

## 2018-05-05 DIAGNOSIS — R1312 Dysphagia, oropharyngeal phase: Secondary | ICD-10-CM | POA: Diagnosis not present

## 2018-05-05 DIAGNOSIS — M21372 Foot drop, left foot: Secondary | ICD-10-CM

## 2018-05-05 DIAGNOSIS — F3341 Major depressive disorder, recurrent, in partial remission: Secondary | ICD-10-CM

## 2018-05-05 DIAGNOSIS — K219 Gastro-esophageal reflux disease without esophagitis: Secondary | ICD-10-CM | POA: Diagnosis not present

## 2018-05-05 DIAGNOSIS — I2510 Atherosclerotic heart disease of native coronary artery without angina pectoris: Secondary | ICD-10-CM | POA: Diagnosis not present

## 2018-05-05 DIAGNOSIS — K3184 Gastroparesis: Secondary | ICD-10-CM | POA: Diagnosis not present

## 2018-05-05 DIAGNOSIS — Z8679 Personal history of other diseases of the circulatory system: Secondary | ICD-10-CM

## 2018-05-05 DIAGNOSIS — E782 Mixed hyperlipidemia: Secondary | ICD-10-CM

## 2018-05-05 DIAGNOSIS — Z6827 Body mass index (BMI) 27.0-27.9, adult: Secondary | ICD-10-CM

## 2018-05-05 MED ORDER — OMEPRAZOLE-SODIUM BICARBONATE 40-1100 MG PO CAPS: ORAL_CAPSULE | ORAL | 1 refills | Status: DC

## 2018-05-05 MED ORDER — LEVOTHYROXINE SODIUM 88 MCG PO TABS: ORAL_TABLET | ORAL | 1 refills | Status: DC

## 2018-05-05 MED ORDER — APIXABAN 2.5 MG PO TABS: 2.5000 mg | ORAL_TABLET | Freq: Two times a day (BID) | ORAL | 3 refills | Status: DC

## 2018-05-05 MED ORDER — FUROSEMIDE 20 MG PO TABS: ORAL_TABLET | ORAL | 1 refills | Status: DC

## 2018-05-05 MED ORDER — AMLODIPINE BESYLATE 5 MG PO TABS: 5.0000 mg | ORAL_TABLET | Freq: Every day | ORAL | 1 refills | Status: DC

## 2018-05-05 MED ORDER — ESCITALOPRAM OXALATE 20 MG PO TABS: ORAL_TABLET | ORAL | 1 refills | Status: DC

## 2018-05-05 MED ORDER — OLMESARTAN MEDOXOMIL 40 MG PO TABS: 40.0000 mg | ORAL_TABLET | Freq: Every day | ORAL | 1 refills | Status: DC

## 2018-05-05 MED ORDER — AMIODARONE HCL 200 MG PO TABS: 200.0000 mg | ORAL_TABLET | Freq: Every day | ORAL | 1 refills | Status: DC

## 2018-05-05 NOTE — Progress Notes (Signed)
Subjective:    Patient ID: Erica [name], female    DOB: 1930-04-13, 84 y.o.   MRN:    Chief Complaint: Medical Management of Chronic Issues and Dizziness   HPI:  1. Essential hypertension  No c/o chest pain, sob or headache. Does not check blood pressure at home. BP Readings from Last 3 Encounters:  05/05/18 130/66  04/03/18 (!) 166/60  03/29/18 129/64     2. Hypothyroidism, unspecified type  No problems that she is aware of  3. Mixed hyperlipidemia Does not watch diet and does no exercise because she is afraid she will fall   4. Gastroesophageal reflux disease without esophagitis  Takes zegrid daily  5. Recurrent major depressive disorder, in partial remission [facility])  she has been on lexapro for awhile and is working well. Depression screen [facility] 2/9 05/05/2018 01/13/2018 11/11/2017  Decreased Interest 0 0 0  Down, Depressed, Hopeless 1 0 0  PHQ - 2 Score 1 0 0  Altered sleeping - - -  Tired, decreased energy - - -  Change in appetite - - -  Feeling bad or failure about yourself  - - -  Trouble concentrating - - -  Moving slowly or fidgety/restless - - -  Suicidal thoughts - - -  PHQ-9 Score - - -     6. BMI 27.0-27.9,adult  No recent weight changes  7. Oropharyngeal dysphagia / gastroparesis She just had endoscopy and had her esophagus stretched. Shehas follow up in June to discuss results and what her options for treatment  8.      Left foot drop          This makes her gait unsteady and she has frequent falls. Last fall              was 2 weeks ago and she landed on her right hip. No pain with                 walking, jut bruising.  Outpatient Encounter Medications as of 05/05/2018  Medication Sig  . amiodarone (PACERONE) 200 MG tablet TAKE 1 TABLET DAILY  . amLODipine (NORVASC) 5 MG tablet TAKE 1 TABLET DAILY  . apixaban (ELIQUIS) 2.5 MG TABS tablet Take 1 tablet (2.5 mg total) by mouth 2 (two) times daily.  . Cholecalciferol (VITAMIN D PO) Take 1 tablet by  mouth daily.  Marland Kitchen escitalopram (LEXAPRO) 20 MG tablet TAKE (2) TABLETS DAILY  . EVENING PRIMROSE OIL PO Take 1 capsule by mouth daily.  . furosemide (LASIX) 20 MG tablet TAKE 1 TABLET ONCE DAILY AS NEEDED FOR EDEMA OR FLUID  . levothyroxine (SYNTHROID, LEVOTHROID) 88 MCG tablet TAKE (1) TABLET DAILY BE- FORE BREAKFAST.  . Melatonin 5 MG CAPS Take 5 mg by mouth at bedtime as needed (sleep).  . Multiple Vitamin (MULTIVITAMIN WITH MINERALS) TABS tablet Take 1 tablet by mouth daily.  Marland Kitchen nystatin cream (MYCOSTATIN) Apply 1 application topically 2 (two) times daily. x7 days  . olmesartan (BENICAR) 40 MG tablet TAKE 1 TABLET DAILY  . omeprazole-sodium bicarbonate (ZEGERID) 40-1100 MG capsule TAKE 1 CAPSULE BY MOUTH TWICE A DAY BEFORE BREAKFAST AND AT BEDTIME  . Omega-3 Fatty Acids (FISH OIL PO) Take 1 tablet by mouth daily.      New complaints: Says she feels like heart is not beating just right. She does have history of atrial fib. She says she saw cardiologist 6 weeks ago and ws told she may eventually need a Psychologist, forensic.  Social  history: Lives alone- has lots of family that comes in to see her often. Does not have a medical alert system.  Review of Systems  Constitutional: Negative.   HENT: Negative.   Respiratory: Negative.   Cardiovascular: Positive for palpitations (heart feels like not beating right) and leg swelling (occasional).  Gastrointestinal: Negative.   Genitourinary: Negative.   Musculoskeletal: Positive for gait problem.  Skin:       Bruising on right hip  Psychiatric/Behavioral: Negative.   All other systems reviewed and are negative.      Objective:   Physical Exam  Constitutional: She is oriented to person, place, and time. She appears well-developed and well-nourished. No distress.  HENT:  Head: Normocephalic.  Nose: Nose normal.  Mouth/Throat: Oropharynx is clear and moist.  Eyes: Pupils are equal, round, and reactive to light. EOM are normal.  Neck: Normal  range of motion. Neck supple. No JVD present. Carotid bruit is not present.  Cardiovascular: Normal rate, normal heart sounds and intact distal pulses.  Irregular heart beat   Pulmonary/Chest: Effort normal and breath sounds normal. No respiratory distress. She has no wheezes. She has no rales. She exhibits no tenderness.  Abdominal: Soft. Normal appearance, normal aorta and bowel sounds are normal. She exhibits no distension, no abdominal bruit, no pulsatile midline mass and no mass. There is no splenomegaly or hepatomegaly. There is no tenderness.  Musculoskeletal: Normal range of motion. She exhibits no edema.  Left foot drop Gait slow and steady once she gets going  Lymphadenopathy:    She has no cervical adenopathy.  Neurological: She is alert and oriented to person, place, and time. She has normal reflexes.  Skin: Skin is warm and dry.  Psychiatric: She has a normal mood and affect. Her behavior is normal. Judgment normal.   BP 130/66   Pulse (!) 58   Temp 98.2 F (36.8 C) (Oral)   Ht 5' 1"  (1.549 m)   Wt 142 lb (64.4 kg)   BMI 26.83 kg/m   EKG- sinus bradycardia      Assessment & Plan:  [name] comes in today with chief complaint of Medical Management of Chronic Issues and Dizziness   Diagnosis and orders addressed:  1. Essential hypertension Low sodium diet - CMP14+EGFR - furosemide (LASIX) 20 MG tablet; TAKE 1 TABLET ONCE DAILY AS NEEDED FOR EDEMA OR FLUID  Dispense: 90 tablet; Refill: 1 - amLODipine (NORVASC) 5 MG tablet; Take 1 tablet (5 mg total) by mouth daily.  Dispense: 90 tablet; Refill: 1 - olmesartan (BENICAR) 40 MG tablet; Take 1 tablet (40 mg total) by mouth daily.  Dispense: 90 tablet; Refill: 1  2. Hypothyroidism, unspecified type - Thyroid Panel With TSH - levothyroxine (SYNTHROID, LEVOTHROID) 88 MCG tablet; TAKE (1) TABLET DAILY BE- FORE BREAKFAST.  Dispense: 90 tablet; Refill: 1  3. Mixed hyperlipidemia Low fat diet discussed - Lipid  panel  4. Gastroesophageal reflux disease without esophagitis Avoid spicy foods Do not eat 2 hours prior to bedtime  - omeprazole-sodium bicarbonate (ZEGERID) 40-1100 MG capsule; TAKE 1 CAPSULE BY MOUTH TWICE A DAY BEFORE BREAKFAST AND AT BEDTIME  Dispense: 180 capsule; Refill: 1  5. Recurrent major depressive disorder, in partial remission (HCC) Stress management - escitalopram (LEXAPRO) 20 MG tablet; TAKE (2) TABLETS DAILY  Dispense: 180 tablet; Refill: 1  6. BMI 27.0-27.9,adult Discussed diet and exercise for person with BMI >25 Will recheck weight in 3-6 months  7. Oropharyngeal dysphagia Keep follow up with GI to discuss  results of endoscopy  8. Left foot drop Ankle support in shoe will help with foot drop and may make gait staedier  9. Hx of atrial fibrillation without current medication Keep follo wup with crdiology sees every 3 months - EKG 12-Lead  10. Gastroparesis Will discuss with go to follow up appointment  11. Coronary artery disease involving native coronary artery of native heart without angina pectoris - amiodarone (PACERONE) 200 MG tablet; Take 1 tablet (200 mg total) by mouth daily.  Dispense: 90 tablet; Refill: 1 - apixaban (ELIQUIS) 2.5 MG TABS tablet; Take 1 tablet (2.5 mg total) by mouth 2 (two) times daily.  Dispense: 60 tablet; Refill: 3   Labs pending Health Maintenance reviewed Diet and exercise encouraged  Follow up plan: 3 months   Mary-Margaret Hassell Done, [name]

## 2018-05-05 NOTE — Addendum Note (Signed)
Addended by: Shelbie Ammons on: 05/05/2018 04:31 PM   Modules accepted: Orders

## 2018-05-29 DIAGNOSIS — N904 Leukoplakia of vulva: Secondary | ICD-10-CM | POA: Diagnosis not present

## 2018-05-29 DIAGNOSIS — L904 Acrodermatitis chronica atrophicans: Secondary | ICD-10-CM | POA: Diagnosis not present

## 2018-06-16 ENCOUNTER — Ambulatory Visit (INDEPENDENT_AMBULATORY_CARE_PROVIDER_SITE_OTHER): Payer: Medicare Other | Admitting: Internal Medicine

## 2018-06-16 ENCOUNTER — Encounter: Payer: Self-pay | Admitting: Internal Medicine

## 2018-06-16 VITALS — BP 110/60 | HR 58 | Ht 61.0 in | Wt 141.0 lb

## 2018-06-16 DIAGNOSIS — I2510 Atherosclerotic heart disease of native coronary artery without angina pectoris: Secondary | ICD-10-CM

## 2018-06-16 DIAGNOSIS — K224 Dyskinesia of esophagus: Secondary | ICD-10-CM | POA: Insufficient documentation

## 2018-06-16 DIAGNOSIS — K3184 Gastroparesis: Secondary | ICD-10-CM
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
## 2018-06-16 NOTE — Patient Instructions (Signed)
  Please call us in October for a December appointment.   Great to see you.    I appreciate the opportunity to care for you. Silvano Rusk, MD, [facility]

## 2018-06-16 NOTE — Assessment & Plan Note (Signed)
Stable Manage using diet RTC 6 mos

## 2018-06-16 NOTE — Assessment & Plan Note (Signed)
Managed with diet.  Seen on barium swallow and EGD suspected by tortuous esophagus

## 2018-06-26 DIAGNOSIS — N3941 Urge incontinence: Secondary | ICD-10-CM | POA: Diagnosis not present

## 2018-06-26 DIAGNOSIS — R3121 Asymptomatic microscopic hematuria: Secondary | ICD-10-CM | POA: Diagnosis not present

## 2018-07-04 ENCOUNTER — Other Ambulatory Visit: Payer: Self-pay | Admitting: Nurse Practitioner

## 2018-07-04 DIAGNOSIS — I10 Essential (primary) hypertension: Secondary | ICD-10-CM

## 2018-08-25 ENCOUNTER — Ambulatory Visit (INDEPENDENT_AMBULATORY_CARE_PROVIDER_SITE_OTHER): Payer: Medicare Other | Admitting: Nurse Practitioner

## 2018-08-25 ENCOUNTER — Encounter: Payer: Self-pay | Admitting: Nurse Practitioner

## 2018-08-25 VITALS — BP 137/63 | HR 54 | Temp 97.1°F | Ht 61.0 in | Wt 139.0 lb

## 2018-08-25 DIAGNOSIS — Z6827 Body mass index (BMI) 27.0-27.9, adult: Secondary | ICD-10-CM | POA: Diagnosis not present

## 2018-08-25 DIAGNOSIS — K219 Gastro-esophageal reflux disease without esophagitis: Secondary | ICD-10-CM | POA: Diagnosis not present

## 2018-08-25 DIAGNOSIS — I10 Essential (primary) hypertension: Secondary | ICD-10-CM

## 2018-08-25 DIAGNOSIS — K3184 Gastroparesis: Secondary | ICD-10-CM | POA: Diagnosis not present

## 2018-08-25 DIAGNOSIS — F3341 Major depressive disorder, recurrent, in partial remission: Secondary | ICD-10-CM

## 2018-08-25 DIAGNOSIS — E782 Mixed hyperlipidemia: Secondary | ICD-10-CM

## 2018-08-25 DIAGNOSIS — I2510 Atherosclerotic heart disease of native coronary artery without angina pectoris: Secondary | ICD-10-CM | POA: Diagnosis not present

## 2018-08-25 DIAGNOSIS — R1312 Dysphagia, oropharyngeal phase: Secondary | ICD-10-CM | POA: Diagnosis not present

## 2018-08-25 DIAGNOSIS — E039 Hypothyroidism, unspecified: Secondary | ICD-10-CM

## 2018-08-25 MED ORDER — APIXABAN 2.5 MG PO TABS: 2.5000 mg | ORAL_TABLET | Freq: Two times a day (BID) | ORAL | 3 refills | Status: DC

## 2018-08-25 MED ORDER — ESCITALOPRAM OXALATE 20 MG PO TABS: ORAL_TABLET | ORAL | 1 refills | Status: DC

## 2018-08-25 MED ORDER — FUROSEMIDE 20 MG PO TABS: ORAL_TABLET | ORAL | 1 refills | Status: DC

## 2018-08-25 MED ORDER — LEVOTHYROXINE SODIUM 88 MCG PO TABS: ORAL_TABLET | ORAL | 1 refills | Status: DC

## 2018-08-25 MED ORDER — AMLODIPINE BESYLATE 5 MG PO TABS: 5.0000 mg | ORAL_TABLET | Freq: Every day | ORAL | 1 refills | Status: DC

## 2018-08-25 MED ORDER — OLMESARTAN MEDOXOMIL 40 MG PO TABS: 40.0000 mg | ORAL_TABLET | Freq: Every day | ORAL | 1 refills | Status: DC

## 2018-08-25 MED ORDER — ESOMEPRAZOLE MAGNESIUM 40 MG PO CPDR: 40.0000 mg | DELAYED_RELEASE_CAPSULE | Freq: Two times a day (BID) | ORAL | 1 refills | Status: DC

## 2018-08-25 NOTE — Patient Instructions (Signed)

## 2018-08-25 NOTE — Progress Notes (Signed)
Subjective:    Patient ID: Erica [name], female    DOB: [birth year], 84 y.o.   MRN: [id number]   Chief Complaint: Medical management of chronic health issues  HPI:  1. Essential hypertension  -Sees Dr. Terrence [place] (cards) -takes BP at home (2-3x week) 150/70's   2. Gastroesophageal reflux disease without esophagitis  -saw Dr. Carlean Purl 2 months ago -worse at night but not much  3. Hypothyroidism, unspecified type  -still taking synthroid -wants to have it checked   4. Recurrent major depressive disorder, in partial remission ([place])  -controlled on meds  5. Mixed hyperlipidemia  -takes lipitor only 3x/week because of lower extremitiy arthralgia   6. BMI 27.0-27.9,adult  -poor appetite -afraid to eat because of bowel problems -drinks ensure every day   7. Gastroparesis  -saw Dr. Lorayne Bender 2 months ago -gagging -worried about stomach -eats several small meals -takes miralax   8. Oropharyngeal dysphagia  -"strangling" when drinking liquids 1-2x/week -swallow test was OK    Outpatient Encounter Medications as of 08/25/2018  Medication Sig  . amiodarone (PACERONE) 200 MG tablet Take 1 tablet (200 mg total) by mouth daily.  Marland Kitchen amLODipine (NORVASC) 5 MG tablet TAKE 1 TABLET DAILY  . apixaban (ELIQUIS) 2.5 MG TABS tablet Take 1 tablet (2.5 mg total) by mouth 2 (two) times daily.  Marland Kitchen atorvastatin (LIPITOR) 10 MG tablet   . Cholecalciferol (VITAMIN D PO) Take 1 tablet by mouth daily.  Marland Kitchen escitalopram (LEXAPRO) 20 MG tablet TAKE (2) TABLETS DAILY  . esomeprazole (NEXIUM) 40 MG capsule Take 40 mg by mouth 2 (two) times daily before a meal.  . EVENING PRIMROSE OIL PO Take 1 capsule by mouth daily.  . furosemide (LASIX) 20 MG tablet TAKE 1 TABLET ONCE DAILY AS NEEDED FOR EDEMA OR FLUID  . levothyroxine (SYNTHROID, LEVOTHROID) 88 MCG tablet TAKE (1) TABLET DAILY BE- FORE BREAKFAST.  . Melatonin 5 MG CAPS Take 5 mg by mouth at bedtime as needed (sleep).  . Multiple Vitamin (MULTIVITAMIN  WITH MINERALS) TABS tablet Take 1 tablet by mouth daily.  Marland Kitchen nystatin cream (MYCOSTATIN) Apply 1 application topically 2 (two) times daily. x7 days  . olmesartan (BENICAR) 40 MG tablet Take 1 tablet (40 mg total) by mouth daily.  . Omega-3 Fatty Acids (FISH OIL PO) Take 1 tablet by mouth daily.  Marland Kitchen OVER THE COUNTER MEDICATION IB gard as needed  . Wheat Dextrin (BENEFIBER) POWD Take by mouth. Take daily    New complaints: None today  Social history: Lives by herself. Has lots of family that check on her daily   Review of Systems  Constitutional: Negative for activity change and appetite change.  HENT: Negative.   Eyes: Negative for pain.  Respiratory: Negative for shortness of breath.   Cardiovascular: Negative for chest pain, palpitations and leg swelling.  Gastrointestinal: Negative for abdominal pain.  Endocrine: Negative for polydipsia.  Genitourinary: Negative.   Skin: Negative for rash.  Neurological: Negative for dizziness, weakness and headaches.  Hematological: Does not bruise/bleed easily.  Psychiatric/Behavioral: Negative.   All other systems reviewed and are negative.      Objective:   Physical Exam  Constitutional: She is oriented to person, place, and time. She appears well-developed and well-nourished. No distress.  HENT:  Head: Normocephalic.  Nose: Nose normal.  Mouth/Throat: Oropharynx is clear and moist.  Eyes: Pupils are equal, round, and reactive to light. EOM are normal.  Neck: Normal range of motion. Neck supple. No JVD present. Carotid bruit is  not present.  Cardiovascular: Normal rate, regular rhythm, normal heart sounds and intact distal pulses.  Pulmonary/Chest: Effort normal and breath sounds normal. No respiratory distress. She has no wheezes. She has no rales. She exhibits no tenderness.  Abdominal: Soft. Normal appearance, normal aorta and bowel sounds are normal. She exhibits no distension, no abdominal bruit, no pulsatile midline mass and no  mass. There is no splenomegaly or hepatomegaly. There is no tenderness.  Musculoskeletal: Normal range of motion. She exhibits no edema.  Lymphadenopathy:    She has no cervical adenopathy.  Neurological: She is alert and oriented to person, place, and time. She has normal reflexes.  Skin: Skin is warm and dry.  Multiple tick bites on abdomen and right lower leg. No sign of infection  Psychiatric: She has a normal mood and affect. Her behavior is normal. Judgment and thought content normal.  Nursing note and vitals reviewed.  BP 137/63   Pulse (!) 54   Temp (!) 97.1 F (36.2 C) (Oral)   Ht _0  (1.549 m)   Wt 139 lb (63 kg)   BMI 26.26 kg/m         Assessment & Plan:  Erica [name] comes in today with chief complaint of Medical Management of Chronic Issues (Place on bottom that feels rough)   Diagnosis and orders addressed:  1. Essential hypertension Low sodium diet - amLODipine (NORVASC) 5 MG tablet; Take 1 tablet (5 mg total) by mouth daily.  Dispense: 90 tablet; Refill: 1 - olmesartan (BENICAR) 40 MG tablet; Take 1 tablet (40 mg total) by mouth daily.  Dispense: 90 tablet; Refill: 1 - furosemide (LASIX) 20 MG tablet; TAKE 1 TABLET ONCE DAILY AS NEEDED FOR EDEMA OR FLUID  Dispense: 90 tablet; Refill: 1  2. Gastroesophageal reflux disease without esophagitis Avoid spicy foods Do not eat 2 hours prior to bedtime - esomeprazole (NEXIUM) 40 MG capsule; Take 1 capsule (40 mg total) by mouth 2 (two) times daily before a meal.  Dispense: 90 capsule; Refill: 1  3. Hypothyroidism, unspecified type - levothyroxine (SYNTHROID, LEVOTHROID) 88 MCG tablet; TAKE (1) TABLET DAILY BE- FORE BREAKFAST.  Dispense: 90 tablet; Refill: 1  4. Recurrent major depressive disorder, in partial remission (H[facility]) Stress management - escitalopram (LEXAPRO) 20 MG tablet; TAKE (2) TABLETS DAILY  Dispense: 180 tablet; Refill: 1  5. Mixed hyperlipidemia Low fat diet  6. BMI  27.0-27.9,adult Discussed diet and exercise for person with BMI >25 Will recheck weight in 3-6 months  7. Gastroparesis Continue to eat small meds  8. Oropharyngeal dysphagia Chew food well  9. Coronary artery disease involving native coronary artery of native heart without angina pectoris [place][ follow up with cardilogy - apixaban (ELIQUIS) 2.5 MG TABS tablet; Take 1 tablet (2.5 mg total) by mouth 2 (two) times daily.  Dispense: 60 tablet; Refill: 3  Orders Placed This Encounter  Procedures  . CMP14+EGFR  . Lipid panel  . Thyroid Panel With TSH    Labs pending Health Maintenance reviewed Diet and exercise encouraged  Follow up plan: 3 months   Mary-Margaret Hassell Done, [name]

## 2018-08-26 LAB — CMP14+EGFR
A/G RATIO: 1.7 (ref 1.2–2.2)
ALT: 62 IU/L — AB (ref 0–32)
AST: 58 IU/L — ABNORMAL HIGH (ref 0–40)
Albumin: 4 g/dL (ref 3.5–4.7)
Alkaline Phosphatase: 67 IU/L (ref 39–117)
BILIRUBIN TOTAL: 0.4 mg/dL (ref 0.0–1.2)
BUN/Creatinine Ratio: 15 (ref 12–28)
BUN: 16 mg/dL (ref 8–27)
CALCIUM: 9.6 mg/dL (ref 8.7–10.3)
CO2: 23 mmol/L (ref 20–29)
Chloride: 101 mmol/L (ref 96–106)
Creatinine, Ser: 1.07 mg/dL — ABNORMAL HIGH (ref 0.57–1.00)
GFR calc Af Amer: 55 mL/min/{1.73_m2} — ABNORMAL LOW (ref >59)
GFR calc non Af Amer: 47 mL/min/{1.73_m2} — ABNORMAL LOW (ref >59)
GLOBULIN, TOTAL: 2.4 g/dL (ref 1.5–4.5)
Glucose: 94 mg/dL (ref 65–99)
POTASSIUM: 5 mmol/L (ref 3.5–5.2)
Sodium: 140 mmol/L (ref 134–144)
Total Protein: 6.4 g/dL (ref 6.0–8.5)

## 2018-08-26 LAB — LIPID PANEL
CHOLESTEROL TOTAL: 174 mg/dL (ref 100–199)
Chol/HDL Ratio: 3.3 ratio (ref 0.0–4.4)
HDL: 53 mg/dL (ref >39)
LDL Calculated: 92 mg/dL (ref 0–99)
TRIGLYCERIDES: 145 mg/dL (ref 0–149)
VLDL Cholesterol Cal: 29 mg/dL (ref 5–40)

## 2018-08-26 LAB — THYROID PANEL WITH TSH
FREE THYROXINE INDEX: 4.1 (ref 1.2–4.9)
T3 UPTAKE RATIO: 34 % (ref 24–39)
T4, Total: 12.2 ug/dL — ABNORMAL HIGH (ref 4.5–12.0)
TSH: 1.56 u[IU]/mL (ref 0.450–4.500)

## 2018-08-29 ENCOUNTER — Telehealth: Payer: Self-pay | Admitting: Nurse Practitioner

## 2018-08-30 NOTE — Telephone Encounter (Signed)
Aware of results. 

## 2018-09-04 ENCOUNTER — Telehealth: Payer: Self-pay | Admitting: Internal Medicine

## 2018-09-04 NOTE — Telephone Encounter (Signed)
Patient notified it is ok to take probiotic

## 2018-09-08 ENCOUNTER — Telehealth: Payer: Self-pay | Admitting: Internal Medicine

## 2018-09-08 NOTE — Telephone Encounter (Signed)
Patient reports a few days of severe reflux, waking up choking on her dinner, and constipation. She is advised to start on Stage 2 Gastroparesis diet over the weekend, continue her Miralax and dulcolax regimen for her constipation.  We reviewed anti-reflux measures.  She is advised I will have Dr. Carlean Purl review her chart for possible additional medications.

## 2018-09-11 NOTE — Telephone Encounter (Signed)
She reports that she is slightly improved.  She did have a BM over the weekend.  "I feel a little better".  She will continue her bowel regimen and call back if her symptoms worsen.

## 2018-09-11 NOTE — Telephone Encounter (Signed)
Please get a symptom update  Medication options are limited

## 2018-09-20 ENCOUNTER — Telehealth: Payer: Self-pay | Admitting: Nurse Practitioner

## 2018-09-20 ENCOUNTER — Ambulatory Visit (INDEPENDENT_AMBULATORY_CARE_PROVIDER_SITE_OTHER): Payer: Medicare Other | Admitting: Physician Assistant

## 2018-09-20 VITALS — BP 131/74 | HR 74 | Temp 97.7°F | Wt 134.6 lb

## 2018-09-20 DIAGNOSIS — R110 Nausea: Secondary | ICD-10-CM

## 2018-09-20 DIAGNOSIS — I2510 Atherosclerotic heart disease of native coronary artery without angina pectoris: Secondary | ICD-10-CM

## 2018-09-20 DIAGNOSIS — R0602 Shortness of breath: Secondary | ICD-10-CM | POA: Diagnosis not present

## 2018-09-20 DIAGNOSIS — R079 Chest pain, unspecified: Secondary | ICD-10-CM | POA: Diagnosis not present

## 2018-09-20 MED ORDER — ONDANSETRON 4 MG PO TBDP: 4.0000 mg | ORAL_TABLET | Freq: Three times a day (TID) | ORAL | 0 refills | Status: DC | PRN

## 2018-09-20 NOTE — Telephone Encounter (Signed)
Left message to call back  

## 2018-09-21 ENCOUNTER — Telehealth: Payer: Self-pay | Admitting: Internal Medicine

## 2018-09-21 NOTE — Telephone Encounter (Signed)
Spoke to patient, yesterday she was having a rough day with her gastroparesis and could not keep most of her food down. She had family PA-C send in [place] for the nausea. Today she had a bm, and is able to keep her food down. She has not picked up the Zofran yet, but she is not nauseated today. Advised patient that when she has days like that to switch to a liquid diet for a day or so, advance as tolerated. Encouraged her to drink her Ensure to help with caloric intake. Continue to take medication as prescribed, contact office if she has concerns or questions.

## 2018-09-25 NOTE — Progress Notes (Signed)
BP 131/74   Pulse 74   Temp 97.7 F (36.5 C) (Oral)   Wt 134 lb 9.6 oz (61.1 kg)   BMI 25.43 kg/m    Subjective:    Patient ID: Erica [name], female    DOB: 16-Aug-1930, 84 y.o.   MRN: [id number]  HPI: Erica [name] is a 84 y.o. female presenting on 09/20/2018 for Chest Pain (nausea with SOB)  Patient comes in having 1 day of chills and her stomach feeling upset.  She notes that she has had a history of gastroparesis in the past.  She states she vomited one time this morning there was some whole food involved in it.  She denies any other symptoms at this time.  No diarrhea or constipation.  Mild shortness of breath.  All of her medical history is reviewed along with medications.   Past Medical History:  Diagnosis Date  . Abnormality of gait 08/25/2016  . Adenomatous rectal polyp   . Arthritis 04/03/2018   L hip  . Atrial fibrillation ([place])   . Bleeding internal hemorrhoids   . Cataract   . Chronic back pain   . Depression   . Diverticulosis of colon (without mention of hemorrhage)   . Gastroparesis   . GERD (gastroesophageal reflux disease)   . Hiatal hernia 09/1999   3cm  . Hyperlipidemia   . Hypertension   . Hypothyroid   . Ischemic colitis ([place])   . Left foot drop 08/25/2016  . Lichen sclerosus et atrophicus of the vulva   . MVP (mitral valve prolapse)   . Osteoporosis   . Sleep apnea   . TIA (transient ischemic attack)   . Weakness    Relevant past medical, surgical, family and social history reviewed and updated as indicated. Interim medical history since our last visit reviewed. Allergies and medications reviewed and updated. DATA REVIEWED: CHART IN EPIC  Family History reviewed for pertinent findings.  Review of Systems  Constitutional: Positive for fatigue.  HENT: Negative.   Eyes: Negative.   Respiratory: Negative.  Negative for shortness of breath and wheezing.   Cardiovascular: Negative for chest pain.  Gastrointestinal: Positive for abdominal  pain, nausea and vomiting. Negative for diarrhea.  Genitourinary: Negative.     Allergies as of 09/20/2018      Reactions   Contrast Media [iodinated Diagnostic Agents] Shortness Of Breath, Nausea Only, Swelling   Iodine Shortness Of Breath, Nausea Only, Swelling   Iohexol Hives, Swelling    Desc: hives, swelling over 5 yrs ago-pt has never been pre-medicated--was told to not have iv contrast ever   Ioxaglate Nausea Only, Shortness Of Breath, Swelling   Metoclopramide Other (See Comments)   Mouth tremors, insomnia, and irritability      Medication List        Accurate as of 09/20/18 11:59 PM. Always use your most recent med list.          amiodarone 200 MG tablet Commonly known as:  PACERONE Take 1 tablet (200 mg total) by mouth daily.   amLODipine 5 MG tablet Commonly known as:  NORVASC Take 1 tablet (5 mg total) by mouth daily.   apixaban 2.5 MG Tabs tablet Commonly known as:  ELIQUIS Take 1 tablet (2.5 mg total) by mouth 2 (two) times daily.   atorvastatin 10 MG tablet Commonly known as:  LIPITOR   BENEFIBER Powd Take by mouth. Take daily   escitalopram 20 MG tablet Commonly known as:  LEXAPRO TAKE (2) TABLETS  DAILY   esomeprazole 40 MG capsule Commonly known as:  NEXIUM Take 1 capsule (40 mg total) by mouth 2 (two) times daily before a meal.   EVENING PRIMROSE OIL PO Take 1 capsule by mouth daily.   FISH OIL PO Take 1 tablet by mouth daily.   furosemide 20 MG tablet Commonly known as:  LASIX TAKE 1 TABLET ONCE DAILY AS NEEDED FOR EDEMA OR FLUID   levothyroxine 88 MCG tablet Commonly known as:  SYNTHROID, LEVOTHROID TAKE (1) TABLET DAILY BE- FORE BREAKFAST.   Melatonin 5 MG Caps Take 5 mg by mouth at bedtime as needed (sleep).   multivitamin with minerals Tabs tablet Take 1 tablet by mouth daily.   nystatin cream Commonly known as:  MYCOSTATIN Apply 1 application topically 2 (two) times daily. x7 days   olmesartan 40 MG tablet Commonly known  as:  BENICAR Take 1 tablet (40 mg total) by mouth daily.   ondansetron 4 MG disintegrating tablet Commonly known as:  ZOFRAN-ODT Take 1 tablet (4 mg total) by mouth every 8 (eight) hours as needed for nausea or vomiting.   OVER THE COUNTER MEDICATION IB gard as needed   VITAMIN D PO Take 1 tablet by mouth daily.          Objective:    BP 131/74   Pulse 74   Temp 97.7 F (36.5 C) (Oral)   Wt 134 lb 9.6 oz (61.1 kg)   BMI 25.43 kg/m   Allergies  Allergen Reactions  . Contrast Media [Iodinated Diagnostic Agents] Shortness Of Breath, Nausea Only and Swelling  . Iodine Shortness Of Breath, Nausea Only and Swelling  . Iohexol Hives and Swelling     Desc: hives, swelling over 5 yrs ago-pt has never been pre-medicated--was told to not have iv contrast ever   . Ioxaglate Nausea Only, Shortness Of Breath and Swelling  . Metoclopramide Other (See Comments)    Mouth tremors, insomnia, and irritability    Wt Readings from Last 3 Encounters:  09/20/18 134 lb 9.6 oz (61.1 kg)  08/25/18 139 lb (63 kg)  06/16/18 141 lb (64 kg)    Physical Exam  Constitutional: She is oriented to person, place, and time. She appears well-developed and well-nourished.  HENT:  Head: Normocephalic and atraumatic.  Right Ear: Tympanic membrane, external ear and ear canal normal.  Left Ear: Tympanic membrane, external ear and ear canal normal.  Nose: Nose normal. No rhinorrhea.  Mouth/Throat: Oropharynx is clear and moist and mucous membranes are normal. No oropharyngeal exudate or posterior oropharyngeal erythema.  Eyes: Pupils are equal, round, and reactive to light. Conjunctivae and EOM are normal.  Neck: Normal range of motion. Neck supple.  Cardiovascular: Normal rate, regular rhythm, normal heart sounds and intact distal pulses.  Pulmonary/Chest: Effort normal and breath sounds normal.  Abdominal: Soft. Bowel sounds are normal.  Neurological: She is alert and oriented to person, place, and  time. She has normal reflexes.  Skin: Skin is warm and dry. No rash noted.  Psychiatric: She has a normal mood and affect. Her behavior is normal. Judgment and thought content normal.    Results for orders placed or performed in visit on 08/25/18  CMP14+EGFR  Result Value Ref Range   Glucose 94 65 - 99 mg/dL   BUN 16 8 - 27 mg/dL   Creatinine, Ser 1.07 (H) 0.57 - 1.00 mg/dL   GFR calc non Af Amer 47 (L) >59 mL/min/1.73   GFR calc Af Amer 55 (L) >59  mL/min/1.73   BUN/Creatinine Ratio 15 12 - 28   Sodium 140 134 - 144 mmol/L   Potassium 5.0 3.5 - 5.2 mmol/L   Chloride 101 96 - 106 mmol/L   CO2 23 20 - 29 mmol/L   Calcium 9.6 8.7 - 10.3 mg/dL   Total Protein 6.4 6.0 - 8.5 g/dL   Albumin 4.0 3.5 - 4.7 g/dL   Globulin, Total 2.4 1.5 - 4.5 g/dL   Albumin/Globulin Ratio 1.7 1.2 - 2.2   Bilirubin Total 0.4 0.0 - 1.2 mg/dL   Alkaline Phosphatase 67 39 - 117 IU/L   AST 58 (H) 0 - 40 IU/L   ALT 62 (H) 0 - 32 IU/L  Lipid panel  Result Value Ref Range   Cholesterol, Total 174 100 - 199 mg/dL   Triglycerides 145 0 - 149 mg/dL   HDL 53 >39 mg/dL   VLDL Cholesterol Cal 29 5 - 40 mg/dL   LDL Calculated 92 0 - 99 mg/dL   Chol/HDL Ratio 3.3 0.0 - 4.4 ratio  Thyroid Panel With TSH  Result Value Ref Range   TSH 1.560 0.450 - 4.500 uIU/mL   T4, Total 12.2 (H) 4.5 - 12.0 ug/dL   T3 Uptake Ratio 34 24 - 39 %   Free Thyroxine Index 4.1 1.2 - 4.9      Assessment & Plan:   1. Chest pain in adult - EKG 12-Lead  2. SOB (shortness of breath) - EKG 12-Lead  3. Nausea - EKG 12-Lead   Continue all other maintenance medications as listed above.  Follow up plan: No follow-ups on file.  Educational handout given for [place] PA-C [place] [address]  [place], [place] [address] [phone]   09/25/2018, 1:05 PM

## 2018-10-03 NOTE — Telephone Encounter (Signed)
Patient was seen 9/25- this encounter will be closed.

## 2018-10-09 DIAGNOSIS — R3121 Asymptomatic microscopic hematuria: Secondary | ICD-10-CM | POA: Diagnosis not present

## 2018-10-09 DIAGNOSIS — N3941 Urge incontinence: Secondary | ICD-10-CM | POA: Diagnosis not present

## 2018-10-10 ENCOUNTER — Telehealth: Payer: Self-pay | Admitting: Nurse Practitioner

## 2018-10-10 NOTE — Telephone Encounter (Signed)
Erica [name] reports she was seen on 09/20/18 by Particia Nearing and reports provider wanted to do chest xray at that time, but it was night clinic and xray was not available.  Erica [name] would like to be seen again so that she can have a chest xray.  Reports she is still having some chest pain that happens after eating.  Appointment scheduled tomorrow with Dr. Warrick Parisian.

## 2018-10-11 ENCOUNTER — Encounter: Payer: Self-pay | Admitting: Family Medicine

## 2018-10-11 ENCOUNTER — Ambulatory Visit (INDEPENDENT_AMBULATORY_CARE_PROVIDER_SITE_OTHER): Payer: Medicare Other

## 2018-10-11 ENCOUNTER — Ambulatory Visit (INDEPENDENT_AMBULATORY_CARE_PROVIDER_SITE_OTHER): Payer: Medicare Other | Admitting: Family Medicine

## 2018-10-11 VITALS — BP 139/65 | HR 51 | Temp 97.6°F | Ht 61.0 in | Wt 136.0 lb

## 2018-10-11 DIAGNOSIS — R0989 Other specified symptoms and signs involving the circulatory and respiratory systems: Secondary | ICD-10-CM

## 2018-10-11 DIAGNOSIS — K30 Functional dyspepsia: Secondary | ICD-10-CM

## 2018-10-11 DIAGNOSIS — Z23 Encounter for immunization: Secondary | ICD-10-CM | POA: Diagnosis not present

## 2018-10-11 DIAGNOSIS — I2510 Atherosclerotic heart disease of native coronary artery without angina pectoris: Secondary | ICD-10-CM | POA: Diagnosis not present

## 2018-10-11 NOTE — Progress Notes (Signed)
BP 139/65   Pulse (!) 51   Temp 97.6 F (36.4 C) (Oral)   Ht 5\' 1"  (1.549 m)   Wt 61.7 kg   BMI 25.70 kg/m    Subjective:    Patient ID: Erica [name], female    DOB: 1930-12-19, 84 y.o.   MRN: [id number]  HPI: 60 y.Marland Kitcheno female with history of afib on eliquis, small hiatal hernia, gastroparesis, and GERD presents to the clinic 10/11/2018 for chest discomfort that has been going on for 1 year. Patient describes pain as midsternal irritation worse at night and after meals. She has associated nausea and vomiting at night, and the feeling that food is stuck in her throat. Patient takes Nexium occasionally, eats small meals, and does not eat right before bed. She is unsure if symptoms are due to gastroparesis, GERD, or other etiology, patient says that she was told she has gastroparesis by her gastroenterologist. She is requesting an x-ray because she thinks it might be her lungs. Patient sees a GI specialist and had endoscopy in April, required dilation, and was recommended IB guard and benefiber. Barium swallow Patient regularly sees her cardiologist and EKG was stable 1 month ago. Patient denies tobacco and alcohol use. Family history includes father with esophageal cancer.   Relevant past medical, surgical, family and social history reviewed and updated as indicated. Interim medical history since our last visit reviewed. Allergies and medications reviewed and updated.  Review of Systems  Constitutional: Positive for unexpected weight change. Negative for activity change, appetite change, chills, fatigue and fever.  HENT: Negative for congestion, facial swelling, mouth sores, nosebleeds, postnasal drip, rhinorrhea, sinus pressure, sinus pain, sore throat, trouble swallowing and voice change.   Respiratory: Negative for shortness of breath and wheezing.   Cardiovascular: Negative for chest pain and leg swelling.  Gastrointestinal: Positive for nausea and vomiting. Negative for constipation and  diarrhea.  Genitourinary: Negative for dysuria and flank pain.  Skin: Negative for color change and rash.    Per HPI unless specifically indicated above   Allergies as of 10/11/2018      Reactions   Contrast Media [iodinated Diagnostic Agents] Shortness Of Breath, Nausea Only, Swelling   Iodine Shortness Of Breath, Nausea Only, Swelling   Iohexol Hives, Swelling    Desc: hives, swelling over 5 yrs ago-pt has never been pre-medicated--was told to not have iv contrast ever   Ioxaglate Nausea Only, Shortness Of Breath, Swelling   Metoclopramide Other (See Comments)   Mouth tremors, insomnia, and irritability      Medication List        Accurate as of 10/11/18  4:36 PM. Always use your most recent med list.          amiodarone 200 MG tablet Commonly known as:  PACERONE Take 1 tablet (200 mg total) by mouth daily.   amLODipine 5 MG tablet Commonly known as:  NORVASC Take 1 tablet (5 mg total) by mouth daily.   apixaban 2.5 MG Tabs tablet Commonly known as:  ELIQUIS Take 1 tablet (2.5 mg total) by mouth 2 (two) times daily.   atorvastatin 10 MG tablet Commonly known as:  LIPITOR   BENEFIBER Powd Take by mouth. Take daily   escitalopram 20 MG tablet Commonly known as:  LEXAPRO TAKE (2) TABLETS DAILY   esomeprazole 40 MG capsule Commonly known as:  NEXIUM Take 1 capsule (40 mg total) by mouth 2 (two) times daily before a meal.   EVENING PRIMROSE OIL PO Take 1  capsule by mouth daily.   FISH OIL PO Take 1 tablet by mouth daily.   furosemide 20 MG tablet Commonly known as:  LASIX TAKE 1 TABLET ONCE DAILY AS NEEDED FOR EDEMA OR FLUID   levothyroxine 88 MCG tablet Commonly known as:  SYNTHROID, LEVOTHROID TAKE (1) TABLET DAILY BE- FORE BREAKFAST.   Melatonin 5 MG Caps Take 5 mg by mouth at bedtime as needed (sleep).   multivitamin with minerals Tabs tablet Take 1 tablet by mouth daily.   nystatin cream Commonly known as:  MYCOSTATIN Apply 1 application  topically 2 (two) times daily. x7 days   olmesartan 40 MG tablet Commonly known as:  BENICAR Take 1 tablet (40 mg total) by mouth daily.   ondansetron 4 MG disintegrating tablet Commonly known as:  ZOFRAN-ODT Take 1 tablet (4 mg total) by mouth every 8 (eight) hours as needed for nausea or vomiting.   OVER THE COUNTER MEDICATION IB gard as needed   VITAMIN D PO Take 1 tablet by mouth daily.          Objective:    BP 139/65   Pulse (!) 51   Temp 97.6 F (36.4 C) (Oral)   Ht 5\' 1"  (1.549 m)   Wt 61.7 kg   BMI 25.70 kg/m   Wt Readings from Last 3 Encounters:  10/11/18 61.7 kg  09/20/18 61.1 kg  08/25/18 63 kg    Physical Exam  Constitutional: She is oriented to person, place, and time. She appears well-nourished.  HENT:  Head: Normocephalic.  Neck: Neck supple. No tracheal deviation present.  Cardiovascular: An irregularly irregular rhythm present. Exam reveals no gallop and no friction rub.  No murmur heard. Pulmonary/Chest: Breath sounds normal.  Abdominal: Soft. Bowel sounds are normal. She exhibits no distension. There is no tenderness. There is no guarding.  Lymphadenopathy:    She has no cervical adenopathy.  Neurological: She is alert and oriented to person, place, and time.  MSK: midsternal tenderness      Assessment & Plan:   Problem List Items Addressed This Visit    None    Visit Diagnoses    Mild dietary indigestion    -  Primary   Could be related to her GERD or gastroparesis, recommend she take Nexium every day   Chest congestion       Relevant Orders   DG Chest 2 View (Completed)   Encounter for immunization       Relevant Orders   Flu vaccine HIGH DOSE PF (Completed)     Gastric congestion Patient presents today for esophageal discomfort, worse at night and after meals, and is bringing up food at night. Patient sees GI doctor and has had extensive workup for this, including an endoscopy in April and barium swallow.Patient is taking  nexium occasionally. Symptoms are likely due to GERD. CXR was normal. Patient unable to take donperidom due to amiodarone interactions. Recommend take nexium and zofran daily. Follow up with cardiology to discuss possibility of changing amiodarone. Follow up with GI to discuss other possible options for gastroparesis and GERD.   Follow up plan: Return if symptoms worsen or fail to improve.  Counseling provided for all of the vaccine components No orders of the defined types were placed in this encounter.  Seen and examined with Cadence furth, PA student, agree with assessment and plan above.  Recommended for patient to take her Nexium daily and use her Zofran that she Artie has at home to help with motility  and then go back to her gastroenterology. Caryl Pina, MD [facility] 10/11/2018, 4:36 PM

## 2018-11-08 DIAGNOSIS — L900 Lichen sclerosus et atrophicus: Secondary | ICD-10-CM | POA: Diagnosis not present

## 2018-11-08 DIAGNOSIS — Z01419 Encounter for gynecological examination (general) (routine) without abnormal findings: Secondary | ICD-10-CM | POA: Diagnosis not present

## 2018-11-08 DIAGNOSIS — Z6825 Body mass index (BMI) 25.0-25.9, adult: Secondary | ICD-10-CM | POA: Diagnosis not present

## 2018-11-08 DIAGNOSIS — N958 Other specified menopausal and perimenopausal disorders: Secondary | ICD-10-CM | POA: Diagnosis not present

## 2018-11-08 DIAGNOSIS — M816 Localized osteoporosis [Lequesne]: Secondary | ICD-10-CM | POA: Diagnosis not present

## 2018-11-27 ENCOUNTER — Other Ambulatory Visit: Payer: Self-pay | Admitting: Nurse Practitioner

## 2018-11-27 DIAGNOSIS — I2510 Atherosclerotic heart disease of native coronary artery without angina pectoris: Secondary | ICD-10-CM

## 2018-12-29 ENCOUNTER — Other Ambulatory Visit: Payer: Self-pay | Admitting: Nurse Practitioner

## 2018-12-29 DIAGNOSIS — I2510 Atherosclerotic heart disease of native coronary artery without angina pectoris: Secondary | ICD-10-CM

## 2019-01-12 DIAGNOSIS — R002 Palpitations: Secondary | ICD-10-CM | POA: Diagnosis not present

## 2019-01-12 DIAGNOSIS — I10 Essential (primary) hypertension: Secondary | ICD-10-CM | POA: Diagnosis not present

## 2019-01-12 DIAGNOSIS — F419 Anxiety disorder, unspecified: Secondary | ICD-10-CM | POA: Diagnosis not present

## 2019-01-12 DIAGNOSIS — E785 Hyperlipidemia, unspecified: Secondary | ICD-10-CM | POA: Diagnosis not present

## 2019-01-12 DIAGNOSIS — E039 Hypothyroidism, unspecified: Secondary | ICD-10-CM | POA: Diagnosis not present

## 2019-01-12 DIAGNOSIS — I2510 Atherosclerotic heart disease of native coronary artery without angina pectoris: Secondary | ICD-10-CM | POA: Diagnosis not present

## 2019-01-12 DIAGNOSIS — I480 Paroxysmal atrial fibrillation: Secondary | ICD-10-CM | POA: Diagnosis not present

## 2019-01-12 DIAGNOSIS — M1990 Unspecified osteoarthritis, unspecified site: Secondary | ICD-10-CM | POA: Diagnosis not present

## 2019-01-17 ENCOUNTER — Telehealth: Payer: Self-pay | Admitting: Nurse Practitioner

## 2019-01-17 NOTE — Telephone Encounter (Signed)
Patient has a follow up appointment scheduled. 

## 2019-01-17 NOTE — Telephone Encounter (Signed)
Per Leafy Ro, appointment scheduled with [name] at 3:30 pm on  Friday, 01/19/2019.

## 2019-01-19 ENCOUNTER — Ambulatory Visit (INDEPENDENT_AMBULATORY_CARE_PROVIDER_SITE_OTHER): Payer: Medicare Other | Admitting: Nurse Practitioner

## 2019-01-19 ENCOUNTER — Encounter: Payer: Self-pay | Admitting: Nurse Practitioner

## 2019-01-19 VITALS — BP 157/78 | HR 52 | Temp 97.1°F | Ht 61.0 in | Wt 134.0 lb

## 2019-01-19 DIAGNOSIS — K640 First degree hemorrhoids: Secondary | ICD-10-CM | POA: Diagnosis not present

## 2019-01-19 DIAGNOSIS — E785 Hyperlipidemia, unspecified: Secondary | ICD-10-CM | POA: Diagnosis not present

## 2019-01-19 DIAGNOSIS — I2510 Atherosclerotic heart disease of native coronary artery without angina pectoris: Secondary | ICD-10-CM | POA: Diagnosis not present

## 2019-01-19 DIAGNOSIS — E039 Hypothyroidism, unspecified: Secondary | ICD-10-CM | POA: Diagnosis not present

## 2019-01-19 DIAGNOSIS — I10 Essential (primary) hypertension: Secondary | ICD-10-CM | POA: Diagnosis not present

## 2019-01-19 DIAGNOSIS — M810 Age-related osteoporosis without current pathological fracture: Secondary | ICD-10-CM | POA: Diagnosis not present

## 2019-01-19 MED ORDER — HYDROCORTISONE 2.5 % RE CREA: 1.0000 "application " | TOPICAL_CREAM | Freq: Two times a day (BID) | RECTAL | 0 refills | Status: DC

## 2019-01-19 NOTE — Progress Notes (Signed)
   Subjective:    Patient ID: Erica [name], female    DOB: 1930/03/07, 85 y.o.   MRN: [id number]   Chief Complaint: Raised area on bottom   HPI Patient comes in today c/o of a sore on her butt. She says it has been there for 3-4 months. Hr gyn looked at it a couple of weeks ago and said it looked like a blood vessel. Now it feels like a grape hanging out of her butt.   Review of Systems  Respiratory: Negative.   Cardiovascular: Negative.   All other systems reviewed and are negative.      Objective:   Physical Exam Constitutional:      General: She is not in acute distress.    Appearance: She is normal weight.  Cardiovascular:     Rate and Rhythm: Normal rate and regular rhythm.     Pulses: Normal pulses.     Heart sounds: Normal heart sounds.  Pulmonary:     Breath sounds: Normal breath sounds.  Genitourinary:    Comments: nonthrombosed external hemorroids around nl opening. Skin:    General: Skin is warm.  Neurological:     General: No focal deficit present.     Mental Status: She is alert.  Psychiatric:        Mood and Affect: Mood normal.        Behavior: Behavior normal.    BP (!) 157/78   Pulse (!) 52   Temp (!) 97.1 F (36.2 C) (Oral)   Ht 5\' 1"  (1.549 m)   Wt 134 lb (60.8 kg)   BMI 25.32 kg/m       Assessment & Plan:  Erica [name] in today with chief complaint of Raised area on bottom   1. Grade I hemorrhoids Stool softners Avoid straining to have bowel movement - hydrocortisone (ANUSOL-HC) 2.5 % rectal cream; Place 1 application rectally 2 (two) times daily.  Dispense: 30 g; Refill: 0  Mary-Margaret Hassell Done, [name]

## 2019-01-19 NOTE — Patient Instructions (Signed)
Hemorrhoids Hemorrhoids are swollen veins that may develop:  In the butt (rectum). These are called internal hemorrhoids.  Around the opening of the butt (anus). These are called external hemorrhoids. Hemorrhoids can cause pain, itching, or bleeding. Most of the time, they do not cause serious problems. They usually get better with diet changes, lifestyle changes, and other home treatments. What are the causes? This condition may be caused by:  Having trouble pooping (constipation).  Pushing hard (straining) to poop.  Watery poop (diarrhea).  Pregnancy.  Being very overweight (obese).  Sitting for long periods of time.  Heavy lifting or other activity that causes you to strain.  Anal sex.  Riding a bike for a long period of time. What are the signs or symptoms? Symptoms of this condition include:  Pain.  Itching or soreness in the butt.  Bleeding from the butt.  Leaking poop.  Swelling in the area.  One or more lumps around the opening of your butt. How is this diagnosed? A doctor can often diagnose this condition by looking at the affected area. The doctor may also:  Do an exam that involves feeling the area with a gloved hand (digital rectal exam).  Examine the area inside your butt using a small tube (anoscope).  Order blood tests. This may be done if you have lost a lot of blood.  Have you get a test that involves looking inside the colon using a flexible tube with a camera on the end (sigmoidoscopy or colonoscopy). How is this treated? This condition can usually be treated at home. Your doctor may tell you to change what you eat, make lifestyle changes, or try home treatments. If these do not help, procedures can be done to remove the hemorrhoids or make them smaller. These may involve:  Placing rubber bands at the base of the hemorrhoids to cut off their blood supply.  Injecting medicine into the hemorrhoids to shrink them.  Shining a type of light  energy onto the hemorrhoids to cause them to fall off.  Doing surgery to remove the hemorrhoids or cut off their blood supply. Follow these instructions at home: Eating and drinking   Eat foods that have a lot of fiber in them. These include whole grains, beans, nuts, fruits, and vegetables.  Ask your doctor about taking products that have added fiber (fibersupplements).  Reduce the amount of fat in your diet. You can do this by: ? Eating low-fat dairy products. ? Eating less red meat. ? Avoiding processed foods.  Drink enough fluid to keep your pee (urine) pale yellow. Managing pain and swelling   Take a warm-water bath (sitz bath) for 20 minutes to ease pain. Do this 3-4 times a day. You may do this in a bathtub or using a portable sitz bath that fits over the toilet.  If told, put ice on the painful area. It may be helpful to use ice between your warm baths. ? Put ice in a plastic bag. ? Place a towel between your skin and the bag. ? Leave the ice on for 20 minutes, 2-3 times a day. General instructions  Take over-the-counter and prescription medicines only as told by your doctor. ? Medicated creams and medicines may be used as told.  Exercise often. Ask your doctor how much and what kind of exercise is best for you.  Go to the bathroom when you have the urge to poop. Do not wait.  Avoid pushing too hard when you poop.  Keep your   butt dry and clean. Use wet toilet paper or moist towelettes after pooping.  Do not sit on the toilet for a long time.  Keep all follow-up visits as told by your doctor. This is important. Contact a doctor if you:  Have pain and swelling that do not get better with treatment or medicine.  Have trouble pooping.  Cannot poop.  Have pain or swelling outside the area of the hemorrhoids. Get help right away if you have:  Bleeding that will not stop. Summary  Hemorrhoids are swollen veins in the butt or around the opening of the butt.   They can cause pain, itching, or bleeding.  Eat foods that have a lot of fiber in them. These include whole grains, beans, nuts, fruits, and vegetables.  Take a warm-water bath (sitz bath) for 20 minutes to ease pain. Do this 3-4 times a day. This information is not intended to replace advice given to you by your health care provider. Make sure you discuss any questions you have with your health care provider. Document Released: 09/21/2008 Document Revised: 05/04/2018 Document Reviewed: 05/04/2018 Elsevier Interactive Patient Education  2019 [employer]  

## 2019-01-26 ENCOUNTER — Other Ambulatory Visit: Payer: Self-pay | Admitting: Nurse Practitioner

## 2019-01-26 DIAGNOSIS — I2510 Atherosclerotic heart disease of native coronary artery without angina pectoris: Secondary | ICD-10-CM

## 2019-02-13 ENCOUNTER — Other Ambulatory Visit: Payer: Self-pay | Admitting: Nurse Practitioner

## 2019-02-13 DIAGNOSIS — Z1231 Encounter for screening mammogram for malignant neoplasm of breast: Secondary | ICD-10-CM

## 2019-02-26 ENCOUNTER — Other Ambulatory Visit: Payer: Self-pay | Admitting: Nurse Practitioner

## 2019-02-26 DIAGNOSIS — I2510 Atherosclerotic heart disease of native coronary artery without angina pectoris: Secondary | ICD-10-CM

## 2019-03-06 ENCOUNTER — Telehealth: Payer: Self-pay | Admitting: Nurse Practitioner

## 2019-03-13 ENCOUNTER — Other Ambulatory Visit: Payer: Self-pay | Admitting: Dermatology

## 2019-03-13 ENCOUNTER — Ambulatory Visit: Payer: Medicare Other

## 2019-03-13 DIAGNOSIS — L570 Actinic keratosis: Secondary | ICD-10-CM | POA: Diagnosis not present

## 2019-03-13 DIAGNOSIS — L821 Other seborrheic keratosis: Secondary | ICD-10-CM | POA: Diagnosis not present

## 2019-03-13 DIAGNOSIS — D485 Neoplasm of uncertain behavior of skin: Secondary | ICD-10-CM | POA: Diagnosis not present

## 2019-03-13 DIAGNOSIS — D229 Melanocytic nevi, unspecified: Secondary | ICD-10-CM | POA: Diagnosis not present

## 2019-04-12 DIAGNOSIS — N898 Other specified noninflammatory disorders of vagina: Secondary | ICD-10-CM | POA: Diagnosis not present

## 2019-04-12 DIAGNOSIS — L900 Lichen sclerosus et atrophicus: Secondary | ICD-10-CM | POA: Diagnosis not present

## 2019-04-12 DIAGNOSIS — L292 Pruritus vulvae: Secondary | ICD-10-CM | POA: Diagnosis not present

## 2019-04-12 DIAGNOSIS — N762 Acute vulvitis: Secondary | ICD-10-CM | POA: Diagnosis not present

## 2019-04-18 DIAGNOSIS — N898 Other specified noninflammatory disorders of vagina: Secondary | ICD-10-CM | POA: Diagnosis not present

## 2019-04-18 DIAGNOSIS — L0390 Cellulitis, unspecified: Secondary | ICD-10-CM | POA: Diagnosis not present

## 2019-04-18 DIAGNOSIS — L292 Pruritus vulvae: Secondary | ICD-10-CM | POA: Diagnosis not present

## 2019-04-23 ENCOUNTER — Encounter (HOSPITAL_COMMUNITY): Payer: Self-pay | Admitting: *Deleted

## 2019-04-23 ENCOUNTER — Emergency Department (HOSPITAL_COMMUNITY)
Admission: EM | Admit: 2019-04-23 | Discharge: 2019-04-23 | Disposition: A | Discharge status: Home / Self Care | Payer: Medicare Other | Attending: Emergency Medicine | Admitting: Emergency Medicine

## 2019-04-23 ENCOUNTER — Encounter (INDEPENDENT_AMBULATORY_CARE_PROVIDER_SITE_OTHER): Payer: Self-pay

## 2019-04-23 ENCOUNTER — Ambulatory Visit (INDEPENDENT_AMBULATORY_CARE_PROVIDER_SITE_OTHER): Payer: Medicare Other | Admitting: Family Medicine

## 2019-04-23 ENCOUNTER — Encounter: Payer: Self-pay | Admitting: Family Medicine

## 2019-04-23 ENCOUNTER — Other Ambulatory Visit: Payer: Self-pay | Admitting: Nurse Practitioner

## 2019-04-23 ENCOUNTER — Emergency Department (HOSPITAL_COMMUNITY): Payer: Medicare Other

## 2019-04-23 ENCOUNTER — Other Ambulatory Visit: Payer: Self-pay

## 2019-04-23 VITALS — BP 188/90 | HR 59 | Temp 97.7°F | Ht 61.0 in | Wt 119.0 lb

## 2019-04-23 DIAGNOSIS — I10 Essential (primary) hypertension: Secondary | ICD-10-CM | POA: Insufficient documentation

## 2019-04-23 DIAGNOSIS — Z79899 Other long term (current) drug therapy: Secondary | ICD-10-CM | POA: Diagnosis not present

## 2019-04-23 DIAGNOSIS — K5730 Diverticulosis of large intestine without perforation or abscess without bleeding: Secondary | ICD-10-CM | POA: Diagnosis not present

## 2019-04-23 DIAGNOSIS — L0291 Cutaneous abscess, unspecified: Secondary | ICD-10-CM

## 2019-04-23 DIAGNOSIS — R112 Nausea with vomiting, unspecified: Secondary | ICD-10-CM

## 2019-04-23 DIAGNOSIS — K3184 Gastroparesis: Secondary | ICD-10-CM | POA: Diagnosis not present

## 2019-04-23 DIAGNOSIS — R110 Nausea: Secondary | ICD-10-CM | POA: Diagnosis not present

## 2019-04-23 DIAGNOSIS — R1110 Vomiting, unspecified: Secondary | ICD-10-CM

## 2019-04-23 DIAGNOSIS — R634 Abnormal weight loss: Secondary | ICD-10-CM | POA: Diagnosis not present

## 2019-04-23 DIAGNOSIS — R5381 Other malaise: Secondary | ICD-10-CM | POA: Diagnosis not present

## 2019-04-23 DIAGNOSIS — I2510 Atherosclerotic heart disease of native coronary artery without angina pectoris: Secondary | ICD-10-CM

## 2019-04-23 DIAGNOSIS — T63301A Toxic effect of unspecified spider venom, accidental (unintentional), initial encounter: Secondary | ICD-10-CM

## 2019-04-23 LAB — COMPREHENSIVE METABOLIC PANEL
ALT: 115 U/L — ABNORMAL HIGH (ref 0–44)
AST: 84 U/L — ABNORMAL HIGH (ref 15–41)
Albumin: 3.8 g/dL (ref 3.5–5.0)
Alkaline Phosphatase: 65 U/L (ref 38–126)
Anion gap: 11 (ref 5–15)
BUN: 15 mg/dL (ref 8–23)
CO2: 20 mmol/L — ABNORMAL LOW (ref 22–32)
Calcium: 9.2 mg/dL (ref 8.9–10.3)
Chloride: 104 mmol/L (ref 98–111)
Creatinine, Ser: 0.82 mg/dL (ref 0.44–1.00)
GFR calc Af Amer: >60 mL/min (ref >60)
GFR calc non Af Amer: >60 mL/min (ref >60)
Glucose, Bld: 98 mg/dL (ref 70–99)
Potassium: 3.7 mmol/L (ref 3.5–5.1)
Sodium: 135 mmol/L (ref 135–145)
Total Bilirubin: 0.9 mg/dL (ref 0.3–1.2)
Total Protein: 6.9 g/dL (ref 6.5–8.1)

## 2019-04-23 LAB — CBC WITH DIFFERENTIAL/PLATELET
Abs Immature Granulocytes: 0.04 10*3/uL (ref 0.00–0.07)
Basophils Absolute: 0.1 10*3/uL (ref 0.0–0.1)
Basophils Relative: 1 %
Eosinophils Absolute: 0.1 10*3/uL (ref 0.0–0.5)
Eosinophils Relative: 1 %
HCT: 44.2 % (ref 36.0–46.0)
Hemoglobin: 14.4 g/dL (ref 12.0–15.0)
Immature Granulocytes: 0 %
Lymphocytes Relative: 24 %
Lymphs Abs: 2.2 10*3/uL (ref 0.7–4.0)
MCH: 30.8 pg (ref 26.0–34.0)
MCHC: 32.6 g/dL (ref 30.0–36.0)
MCV: 94.4 fL (ref 80.0–100.0)
Monocytes Absolute: 0.8 10*3/uL (ref 0.1–1.0)
Monocytes Relative: 9 %
Neutro Abs: 5.9 10*3/uL (ref 1.7–7.7)
Neutrophils Relative %: 65 %
Platelets: 207 10*3/uL (ref 150–400)
RBC: 4.68 MIL/uL (ref 3.87–5.11)
RDW: 13.3 % (ref 11.5–15.5)
WBC: 9 10*3/uL (ref 4.0–10.5)
nRBC: 0 % (ref 0.0–0.2)

## 2019-04-23 LAB — URINALYSIS, ROUTINE W REFLEX MICROSCOPIC
Bacteria, UA: NONE SEEN
Bilirubin Urine: NEGATIVE
Glucose, UA: NEGATIVE mg/dL
Ketones, ur: 20 mg/dL — AB
Leukocytes,Ua: NEGATIVE
Nitrite: NEGATIVE
Protein, ur: NEGATIVE mg/dL
Specific Gravity, Urine: 1.014 (ref 1.005–1.030)
pH: 6 (ref 5.0–8.0)

## 2019-04-23 LAB — MAGNESIUM: Magnesium: 2.3 mg/dL (ref 1.7–2.4)

## 2019-04-23 MED ORDER — ONDANSETRON 4 MG PO TBDP
4.0000 mg | ORAL_TABLET | Freq: Once | ORAL | Status: AC
  Administered 2019-04-23: 4 mg via ORAL
  Filled 2019-04-23: qty 1

## 2019-04-23 MED ORDER — SODIUM CHLORIDE 0.9 % IV SOLN
INTRAVENOUS | Status: DC
  Administered 2019-04-23: 13:00:00 via INTRAVENOUS

## 2019-04-23 MED ORDER — ONDANSETRON HCL 4 MG/2ML IJ SOLN
4.0000 mg | Freq: Once | INTRAMUSCULAR | Status: AC
  Administered 2019-04-23: 13:00:00 4 mg via INTRAVENOUS
  Filled 2019-04-23: qty 2

## 2019-04-23 MED ORDER — MUPIROCIN 2 % EX OINT
TOPICAL_OINTMENT | Freq: Two times a day (BID) | CUTANEOUS | Status: DC
  Filled 2019-04-23: qty 44

## 2019-04-23 MED ORDER — ONDANSETRON 4 MG PO TBDP: 4.0000 mg | ORAL_TABLET | Freq: Three times a day (TID) | ORAL | 0 refills | Status: DC | PRN

## 2019-04-23 MED ORDER — MUPIROCIN CALCIUM 2 % EX CREA: 1.0000 "application " | TOPICAL_CREAM | Freq: Two times a day (BID) | CUTANEOUS | 0 refills | Status: DC

## 2019-04-23 NOTE — Discharge Instructions (Signed)
Because of your gastroparesis you will need to follow up closely with your doctor  If you continue to vomit you should come back to the ER Zofran every 6 hours as needed for nausea Drink clear liquids for the next 2 days then progressively take more solid foods.  ER for worsening vomiting / or pain or fevers.

## 2019-04-23 NOTE — Progress Notes (Signed)
Subjective:  Patient ID: Erica [name], female    DOB: 07/14/1930, 85 y.o.   MRN: [id number]  Chief Complaint:  Insect Bite; Emesis; and Weight Loss   HPI: Erica [name] is a 85 y.o. female presenting on 04/23/2019 for Insect Bite; Emesis; and Weight Loss  Pt presents today for follow up of right groin abscess, weight loss, and severe vomiting. Pt states she was seen on 04/18/2019 for the abscess to her right groin and placed on Septra. Pt states she has been vomiting since this time. States she stopped the medication due to the vomiting. States the vomiting has continued. States she is unable to hold liquids or solids down. States she has had some diarrhea. No melena or hematochezia. Urine output has decreased. No fever, chills, or fatigue. She does feel weak. No cough, shortness of breath, or chest pain.   Relevant past medical, surgical, family, and social history reviewed and updated as indicated.  Allergies and medications reviewed and updated.   Past Medical History:  Diagnosis Date  . Abnormality of gait 08/25/2016  . Adenomatous rectal polyp   . Arthritis 04/03/2018   L hip  . Atrial fibrillation ([place])   . Bleeding internal hemorrhoids   . Cataract   . Chronic back pain   . Depression   . Diverticulosis of colon (without mention of hemorrhage)   . Gastroparesis   . GERD (gastroesophageal reflux disease)   . Hiatal hernia 09/1999   3cm  . Hyperlipidemia   . Hypertension   . Hypothyroid   . Ischemic colitis ([place])   . Left foot drop 08/25/2016  . Lichen sclerosus et atrophicus of the vulva   . MVP (mitral valve prolapse)   . Osteoporosis   . Sleep apnea   . TIA (transient ischemic attack)   . Weakness     Past Surgical History:  Procedure Laterality Date  . CARDIAC CATHETERIZATION N/A 02/03/2016   Procedure: Coronary/Graft Angiography;  Surgeon: Charolette Forward, MD;  Location: [facility];  Service: Cardiovascular;  Laterality: N/A;  . CATARACT EXTRACTION     . CHOLECYSTECTOMY    . COLONOSCOPY     Multiple, polyps  . ESOPHAGEAL MANOMETRY N/A 06/14/2016   Procedure: ESOPHAGEAL MANOMETRY (EM);  Surgeon: Gatha Mayer, MD;  Location: [facility];  Service: Endoscopy;  Laterality: N/A;  . LEFT HEART CATHETERIZATION WITH CORONARY ANGIOGRAM N/A 07/17/2013   Procedure: LEFT HEART CATHETERIZATION WITH CORONARY ANGIOGRAM;  Surgeon: Clent Demark, MD;  Location: [facility];  Service: Cardiovascular;  Laterality: N/A;  . SIGMOIDOSCOPY  08/23/2011  . TUBAL LIGATION    . UPPER GASTROINTESTINAL ENDOSCOPY  08/23/2011   Normal    Social History   Socioeconomic History  . Marital status: Widowed    Spouse name: Not on file  . Number of children: 76  . Years of education: Not on file  . Highest education level: Not on file  Occupational History  . Occupation: retired    Fish farm manager: RETIRED  Social Needs  . Financial resource strain: Not on file  . Food insecurity:    Worry: Not on file    Inability: Not on file  . Transportation needs:    Medical: Not on file    Non-medical: Not on file  Tobacco Use  . Smoking status: Never Smoker  . Smokeless tobacco: Never Used  Substance and Sexual Activity  . Alcohol use: No  . Drug use: No  . Sexual activity: Not on  file  Lifestyle  . Physical activity:    Days per week: Not on file    Minutes per session: Not on file  . Stress: Not on file  Relationships  . Social connections:    Talks on phone: Not on file    Gets together: Not on file    Attends religious service: Not on file    Active member of club or organization: Not on file    Attends meetings of clubs or organizations: Not on file    Relationship status: Not on file  . Intimate partner violence:    Fear of current or ex partner: Not on file    Emotionally abused: Not on file    Physically abused: Not on file    Forced sexual activity: Not on file  Other Topics Concern  . Not on file  Social History Narrative   Lives at home alone.    Right-handed.   2 cups caffeine most days.    Outpatient Encounter Medications as of 04/23/2019  Medication Sig  . amiodarone (PACERONE) 200 MG tablet TAKE 1 TABLET DAILY  . amLODipine (NORVASC) 5 MG tablet Take 1 tablet (5 mg total) by mouth daily.  Marland Kitchen atorvastatin (LIPITOR) 10 MG tablet   . Cholecalciferol (VITAMIN D PO) Take 1 tablet by mouth daily.  Marland Kitchen ELIQUIS 2.5 MG TABS tablet TAKE (1) TABLET TWICE A DAY.  Marland Kitchen escitalopram (LEXAPRO) 20 MG tablet TAKE (2) TABLETS DAILY  . esomeprazole (NEXIUM) 40 MG capsule Take 1 capsule (40 mg total) by mouth 2 (two) times daily before a meal.  . EVENING PRIMROSE OIL PO Take 1 capsule by mouth daily.  . furosemide (LASIX) 20 MG tablet TAKE 1 TABLET ONCE DAILY AS NEEDED FOR EDEMA OR FLUID  . hydrocortisone (ANUSOL-HC) 2.5 % rectal cream Place 1 application rectally 2 (two) times daily.  Marland Kitchen levothyroxine (SYNTHROID, LEVOTHROID) 88 MCG tablet TAKE (1) TABLET DAILY BE- FORE BREAKFAST.  . Melatonin 5 MG CAPS Take 5 mg by mouth at bedtime as needed (sleep).  . Multiple Vitamin (MULTIVITAMIN WITH MINERALS) TABS tablet Take 1 tablet by mouth daily.  Marland Kitchen nystatin cream (MYCOSTATIN) Apply 1 application topically 2 (two) times daily. x7 days  . olmesartan (BENICAR) 40 MG tablet Take 1 tablet (40 mg total) by mouth daily.  . Omega-3 Fatty Acids (FISH OIL PO) Take 1 tablet by mouth daily.  . ondansetron (ZOFRAN ODT) 4 MG disintegrating tablet Take 1 tablet (4 mg total) by mouth every 8 (eight) hours as needed for nausea or vomiting.  Marland Kitchen OVER THE COUNTER MEDICATION IB gard as needed  . Wheat Dextrin (BENEFIBER) POWD Take by mouth. Take daily   No facility-administered encounter medications on file as of 04/23/2019.     Allergies  Allergen Reactions  . Contrast Media [Iodinated Diagnostic Agents] Shortness Of Breath, Nausea Only and Swelling  . Iodine Shortness Of Breath, Nausea Only and Swelling  . Iohexol Hives and Swelling     Desc: hives, swelling over 5 yrs  ago-pt has never been pre-medicated--was told to not have iv contrast ever   . Ioxaglate Nausea Only, Shortness Of Breath and Swelling  . Metoclopramide Other (See Comments)    Mouth tremors, insomnia, and irritability    Review of Systems  Constitutional: Positive for activity change, appetite change and unexpected weight change. Negative for chills, diaphoresis, fatigue and fever.  Respiratory: Negative for cough and shortness of breath.   Cardiovascular: Negative for chest pain and palpitations.  Gastrointestinal: Positive  for abdominal pain, diarrhea, nausea and vomiting. Negative for abdominal distention, anal bleeding, blood in stool, constipation and rectal pain.  Genitourinary: Positive for decreased urine volume.  Musculoskeletal: Negative for arthralgias and myalgias.  Skin: Positive for wound.  Neurological: Positive for weakness. Negative for dizziness, light-headedness and headaches.  Psychiatric/Behavioral: Negative for confusion.  All other systems reviewed and are negative.       Objective:  BP (!) 188/90   Pulse (!) 59   Temp 97.7 F (36.5 C) (Oral)   Ht _0  (1.549 m)   Wt 119 lb (54 kg)   BMI 22.48 kg/m    Wt Readings from Last 3 Encounters:  04/23/19 119 lb (54 kg)  01/19/19 134 lb (60.8 kg)  10/11/18 136 lb (61.7 kg)    Physical Exam Vitals signs and nursing note reviewed.  Constitutional:      General: She is not in acute distress.    Appearance: She is well-developed and well-groomed. She is ill-appearing. She is not toxic-appearing.  HENT:     Head: Normocephalic and atraumatic.     Mouth/Throat:     Mouth: Mucous membranes are dry.  Eyes:     Extraocular Movements: Extraocular movements intact.     Conjunctiva/sclera: Conjunctivae normal.     Pupils: Pupils are equal, round, and reactive to light.  Neck:     Musculoskeletal: Neck supple.     Trachea: Trachea and phonation normal.  Cardiovascular:     Rate and Rhythm: Normal rate and  regular rhythm.     Heart sounds: Normal heart sounds. No murmur. No friction rub. No gallop.   Pulmonary:     Effort: Pulmonary effort is normal. No respiratory distress.     Breath sounds: Normal breath sounds.  Abdominal:     General: Bowel sounds are normal. There is no distension.     Palpations: Abdomen is soft.     Tenderness: There is generalized abdominal tenderness. There is no guarding or rebound.  Skin:    General: Skin is warm and dry.     Capillary Refill: Capillary refill takes less than 2 seconds.     Coloration: Skin is pale.       Neurological:     General: No focal deficit present.     Mental Status: She is alert and oriented to person, place, and time.  Psychiatric:        Mood and Affect: Mood normal.        Behavior: Behavior normal. Behavior is cooperative.        Thought Content: Thought content normal.        Judgment: Judgment normal.     Results for orders placed or performed in visit on 08/25/18  CMP14+EGFR  Result Value Ref Range   Glucose 94 65 - 99 mg/dL   BUN 16 8 - 27 mg/dL   Creatinine, Ser 1.07 (H) 0.57 - 1.00 mg/dL   GFR calc non Af Amer 47 (L) >59 mL/min/1.73   GFR calc Af Amer 55 (L) >59 mL/min/1.73   BUN/Creatinine Ratio 15 12 - 28   Sodium 140 134 - 144 mmol/L   Potassium 5.0 3.5 - 5.2 mmol/L   Chloride 101 96 - 106 mmol/L   CO2 23 20 - 29 mmol/L   Calcium 9.6 8.7 - 10.3 mg/dL   Total Protein 6.4 6.0 - 8.5 g/dL   Albumin 4.0 3.5 - 4.7 g/dL   Globulin, Total 2.4 1.5 - 4.5 g/dL   Albumin/Globulin Ratio  1.7 1.2 - 2.2   Bilirubin Total 0.4 0.0 - 1.2 mg/dL   Alkaline Phosphatase 67 39 - 117 IU/L   AST 58 (H) 0 - 40 IU/L   ALT 62 (H) 0 - 32 IU/L  Lipid panel  Result Value Ref Range   Cholesterol, Total 174 100 - 199 mg/dL   Triglycerides 145 0 - 149 mg/dL   HDL 53 >39 mg/dL   VLDL Cholesterol Cal 29 5 - 40 mg/dL   LDL Calculated 92 0 - 99 mg/dL   Chol/HDL Ratio 3.3 0.0 - 4.4 ratio  Thyroid Panel With TSH  Result Value Ref  Range   TSH 1.560 0.450 - 4.500 uIU/mL   T4, Total 12.2 (H) 4.5 - 12.0 ug/dL   T3 Uptake Ratio 34 24 - 39 %   Free Thyroxine Index 4.1 1.2 - 4.9       Pertinent labs & imaging results that were available during my care of the patient were reviewed by me and considered in my medical decision making.  Assessment & Plan:  [place] was seen today for insect bite, emesis and weight loss.  Diagnoses and all orders for this visit:  Vomiting in adult Unable to hold anything down for 5 days. Decreased urine output. 14 lb weight loss since 04/18/2019  Weight loss 14 lb weight loss since 04/18/2019 office visit.   Abscess Abscess to right groin. Minimal surrounding erythema. No drainage.     Pt going to ED for further evaluation. [facility] aware.   Follow up plan: Return if symptoms worsen or fail to improve.   The above assessment and management plan was discussed with the patient. The patient verbalized understanding of and has agreed to the management plan. Patient is aware to call the clinic if symptoms persist or worsen. Patient is aware when to return to the clinic for a follow-up visit. Patient educated on when it is appropriate to go to the emergency department.   Monia Pouch, [name]-C [facility] [phone]

## 2019-04-23 NOTE — Patient Instructions (Signed)
Pt going to ED for further evaluation and treatment.

## 2019-04-23 NOTE — Progress Notes (Signed)
Telephone visit  Subjective: CC: Spider bite PCP: [name], [name] XTG:[id number] Erica [name] is a 85 y.o. female calls for telephone consult today. Patient provides verbal consent for consult held via phone.  Location of patient: home Location of provider: Working remotely from home Others present for call: none  1. Spider bite Patient reports that she sustained what she thinks is a spider bite to the thigh over a week ago.  She saw her GYN who prescribed her Septra DS for presumed associated cellulitis.  She notes that the redness has gotten better but she has subsequently developed nausea without vomiting and dry heaves over the last 3 days.  She was evaluated by EMS yesterday who stated that they thought this was a brown recluse bite and needed debridement of the tissue.  She has not contacted her dermatologist for evaluation and instead calls [place] today and wishes to have a face-to-face check to determine if she needs this.   ROS: Per HPI  Allergies  Allergen Reactions  . Contrast Media [Iodinated Diagnostic Agents] Shortness Of Breath, Nausea Only and Swelling  . Iodine Shortness Of Breath, Nausea Only and Swelling  . Iohexol Hives and Swelling     Desc: hives, swelling over 5 yrs ago-pt has never been pre-medicated--was told to not have iv contrast ever   . Ioxaglate Nausea Only, Shortness Of Breath and Swelling  . Metoclopramide Other (See Comments)    Mouth tremors, insomnia, and irritability   Past Medical History:  Diagnosis Date  . Abnormality of gait 08/25/2016  . Adenomatous rectal polyp   . Arthritis 04/03/2018   L hip  . Atrial fibrillation ([place])   . Bleeding internal hemorrhoids   . Cataract   . Chronic back pain   . Depression   . Diverticulosis of colon (without mention of hemorrhage)   . Gastroparesis   . GERD (gastroesophageal reflux disease)   . Hiatal hernia 09/1999   3cm  . Hyperlipidemia   . Hypertension   . Hypothyroid   . Ischemic colitis  ([place])   . Left foot drop 08/25/2016  . Lichen sclerosus et atrophicus of the vulva   . MVP (mitral valve prolapse)   . Osteoporosis   . Sleep apnea   . TIA (transient ischemic attack)   . Weakness     Current Outpatient Medications:  .  amiodarone (PACERONE) 200 MG tablet, TAKE 1 TABLET DAILY, Disp: 90 tablet, Rfl: 0 .  amLODipine (NORVASC) 5 MG tablet, Take 1 tablet (5 mg total) by mouth daily., Disp: 90 tablet, Rfl: 1 .  atorvastatin (LIPITOR) 10 MG tablet, , Disp: , Rfl:  .  Cholecalciferol (VITAMIN D PO), Take 1 tablet by mouth daily., Disp: , Rfl:  .  ELIQUIS 2.5 MG TABS tablet, TAKE (1) TABLET TWICE A DAY., Disp: 60 tablet, Rfl: 2 .  escitalopram (LEXAPRO) 20 MG tablet, TAKE (2) TABLETS DAILY, Disp: 180 tablet, Rfl: 1 .  esomeprazole (NEXIUM) 40 MG capsule, Take 1 capsule (40 mg total) by mouth 2 (two) times daily before a meal., Disp: 90 capsule, Rfl: 1 .  EVENING PRIMROSE OIL PO, Take 1 capsule by mouth daily., Disp: , Rfl:  .  furosemide (LASIX) 20 MG tablet, TAKE 1 TABLET ONCE DAILY AS NEEDED FOR EDEMA OR FLUID, Disp: 90 tablet, Rfl: 1 .  hydrocortisone (ANUSOL-HC) 2.5 % rectal cream, Place 1 application rectally 2 (two) times daily., Disp: 30 g, Rfl: 0 .  levothyroxine (SYNTHROID, LEVOTHROID) 88 MCG tablet, TAKE (1) TABLET DAILY  BE- FORE BREAKFAST., Disp: 90 tablet, Rfl: 1 .  Melatonin 5 MG CAPS, Take 5 mg by mouth at bedtime as needed (sleep)., Disp: , Rfl:  .  Multiple Vitamin (MULTIVITAMIN WITH MINERALS) TABS tablet, Take 1 tablet by mouth daily., Disp: , Rfl:  .  nystatin cream (MYCOSTATIN), Apply 1 application topically 2 (two) times daily. x7 days, Disp: 30 g, Rfl: 0 .  olmesartan (BENICAR) 40 MG tablet, Take 1 tablet (40 mg total) by mouth daily., Disp: 90 tablet, Rfl: 1 .  Omega-3 Fatty Acids (FISH OIL PO), Take 1 tablet by mouth daily., Disp: , Rfl:  .  ondansetron (ZOFRAN ODT) 4 MG disintegrating tablet, Take 1 tablet (4 mg total) by mouth every 8 (eight) hours as  needed for nausea or vomiting., Disp: 20 tablet, Rfl: 0 .  OVER THE COUNTER MEDICATION, IB gard as needed, Disp: , Rfl:  .  Wheat Dextrin (BENEFIBER) POWD, Take by mouth. Take daily, Disp: , Rfl:   Assessment/ Plan: 85 y.o. female   1. Spider bite wound, accidental or unintentional, initial encounter She has concern for the lesion being a brown recluse bite.  She is asking for face-to-face visit for evaluation.  I have arranged her to see Monia Pouch at 11:35 AM.  We discussed the appointment time.  She is arranging a ride.   Start time: 9:11am End time: 9:19am  Total time spent on patient care (including telephone call/ virtual visit): 12 minutes  [place], [place] [phone]

## 2019-04-23 NOTE — ED Notes (Signed)
Pt ambulatory to restroom

## 2019-04-23 NOTE — ED Triage Notes (Signed)
Patient reports a "spider bite" on inner right groin on Tuesday.  Patient reports nausea and feeling "sick", and states she has lost about 12 lbs, after being weighed at her PCP office in [place], who then advised her to to come to this ED.  Patient reports vomiting once today.

## 2019-04-23 NOTE — ED Provider Notes (Signed)
[facility] EMERGENCY DEPARTMENT Provider Note   CSN: [id number] Arrival date & time: 04/23/19  1240    History   Chief Complaint Chief Complaint  Patient presents with  . Weight Loss    HPI Erica [name] is a 85 y.o. female.     HPI  The pt is an 85 y/o female - comes from office with 5 days of n/v and not holding food / fluids down, had been started 4/22 on Septra for a possible infection in the groin after having a pain and thinking that she was bitten by a spider (didn't see one), she has had no fevers - the nausea and vomiting started when she started the bactrim and so her last dose was Friday night - had symptoms throughout the weekend and into today - seen at PCP today and sent in for dehydraiton and evaluation - she states the rash doesn't hurt or itchy.    Past Medical History:  Diagnosis Date  . Abnormality of gait 08/25/2016  . Adenomatous rectal polyp   . Arthritis 04/03/2018   L hip  . Atrial fibrillation ([place])   . Bleeding internal hemorrhoids   . Cataract   . Chronic back pain   . Depression   . Diverticulosis of colon (without mention of hemorrhage)   . Gastroparesis   . GERD (gastroesophageal reflux disease)   . Hiatal hernia 09/1999   3cm  . Hyperlipidemia   . Hypertension   . Hypothyroid   . Ischemic colitis ([place])   . Left foot drop 08/25/2016  . Lichen sclerosus et atrophicus of the vulva   . MVP (mitral valve prolapse)   . Osteoporosis   . Sleep apnea   . TIA (transient ischemic attack)   . Weakness     Patient Active Problem List   Diagnosis Date Noted  . Esophageal dysmotility 06/16/2018  . Abnormality of gait 08/25/2016  . Left foot drop 08/25/2016  . Dysphagia   . Internal bleeding hemorrhoids 01/01/2016  . Hypothyroidism 07/07/2015  . BMI 27.0-27.9,adult 07/07/2015  . HTN (hypertension) 12/13/2012  . Hyperlipidemia 12/13/2012  . Depression 06/06/2012  . Gastroparesis 02/17/2012  . History of colon polyps 08/06/2011  . GERD  (gastroesophageal reflux disease) 08/06/2011    Past Surgical History:  Procedure Laterality Date  . CARDIAC CATHETERIZATION N/A 02/03/2016   Procedure: Coronary/Graft Angiography;  Surgeon: Charolette Forward, MD;  Location: [place] [facility];  Service: Cardiovascular;  Laterality: N/A;  . CATARACT EXTRACTION    . CHOLECYSTECTOMY    . COLONOSCOPY     Multiple, polyps  . ESOPHAGEAL MANOMETRY N/A 06/14/2016   Procedure: ESOPHAGEAL MANOMETRY (EM);  Surgeon: Gatha Mayer, MD;  Location: [facility];  Service: Endoscopy;  Laterality: N/A;  . LEFT HEART CATHETERIZATION WITH CORONARY ANGIOGRAM N/A 07/17/2013   Procedure: LEFT HEART CATHETERIZATION WITH CORONARY ANGIOGRAM;  Surgeon: Clent Demark, MD;  Location: [place] CATH LAB;  Service: Cardiovascular;  Laterality: N/A;  . SIGMOIDOSCOPY  08/23/2011  . TUBAL LIGATION    . UPPER GASTROINTESTINAL ENDOSCOPY  08/23/2011   Normal     OB History   No obstetric history on file.      Home Medications    Prior to Admission medications   Medication Sig Start Date End Date Taking? Authorizing Provider  amiodarone (PACERONE) 200 MG tablet TAKE 1 TABLET DAILY 02/27/19  Yes Hassell Done, Mary-Margaret, [name]  amLODipine (NORVASC) 5 MG tablet Take 1 tablet (5 mg total) by mouth daily. 08/25/18  Yes Hassell Done,  Mary-Margaret, [name]  atorvastatin (LIPITOR) 10 MG tablet  06/02/18  Yes [provider]  Cholecalciferol (VITAMIN D PO) Take 1 tablet by mouth daily.   Yes [provider]  ELIQUIS 2.5 MG TABS tablet TAKE (1) TABLET TWICE A DAY. 01/26/19  Yes Hassell Done, Mary-Margaret, [name]  escitalopram (LEXAPRO) 20 MG tablet TAKE (2) TABLETS DAILY 08/25/18  Yes Hassell Done, Mary-Margaret, [name]  esomeprazole (NEXIUM) 40 MG capsule Take 1 capsule (40 mg total) by mouth 2 (two) times daily before a meal. 08/25/18  Yes Hassell Done, Mary-Margaret, [name]  furosemide (LASIX) 20 MG tablet TAKE 1 TABLET ONCE DAILY AS NEEDED FOR EDEMA OR FLUID 08/25/18  Yes Hassell Done, Mary-Margaret, [name]   hydrocortisone (ANUSOL-HC) 2.5 % rectal cream Place 1 application rectally 2 (two) times daily. 01/19/19  Yes Martin, Mary-Margaret, [name]  levothyroxine (SYNTHROID, LEVOTHROID) 88 MCG tablet TAKE (1) TABLET DAILY BE- FORE BREAKFAST. 08/25/18  Yes Martin, Mary-Margaret, [name]  Melatonin 5 MG CAPS Take 5 mg by mouth at bedtime as needed (sleep).   Yes [provider]  Multiple Vitamin (MULTIVITAMIN WITH MINERALS) TABS tablet Take 1 tablet by mouth daily.   Yes [provider]  nystatin cream (MYCOSTATIN) Apply 1 application topically 2 (two) times daily. x7 days 03/29/18  Yes Gottschalk, Ashly M, DO  olmesartan (BENICAR) 40 MG tablet Take 1 tablet (40 mg total) by mouth daily. 08/25/18  Yes Martin, Mary-Margaret, [name]  Omega-3 Fatty Acids (FISH OIL PO) Take 1 tablet by mouth daily.   Yes [provider]  ondansetron (ZOFRAN ODT) 4 MG disintegrating tablet Take 1 tablet (4 mg total) by mouth every 8 (eight) hours as needed for nausea or vomiting. 09/20/18  Yes Terald Sleeper, PA-C  OVER THE COUNTER MEDICATION IB gard as needed   Yes [provider]  Wheat Dextrin (BENEFIBER) POWD Take 1 Dose by mouth daily. Take daily    Yes [provider]  ondansetron (ZOFRAN ODT) 4 MG disintegrating tablet Take 1 tablet (4 mg total) by mouth every 8 (eight) hours as needed for nausea. 04/23/19   Noemi Chapel, MD    Family History Family History  Problem Relation Age of Onset  . Stomach cancer Brother   . Colon cancer Paternal Aunt   . Lung cancer Brother        smoker  . Esophageal cancer Father   . Stroke Mother     Social History Social History   Tobacco Use  . Smoking status: Never Smoker  . Smokeless tobacco: Never Used  Substance Use Topics  . Alcohol use: No  . Drug use: No     Allergies   Contrast media [iodinated diagnostic agents]; Iodine; Iohexol; Ioxaglate; and Metoclopramide   Review of Systems Review of Systems  All other systems reviewed and  are negative.    Physical Exam Updated Vital Signs BP (!) 144/46   Pulse (!) 58   Temp 97.8 F (36.6 C) (Oral)   Resp 11   Ht 1.549 m (5\' 1" )   Wt 58.3 kg   SpO2 92%   BMI 24.28 kg/m   Physical Exam Vitals signs and nursing note reviewed.  Constitutional:      General: She is not in acute distress.    Appearance: She is well-developed.  HENT:     Head: Normocephalic and atraumatic.     Mouth/Throat:     Pharynx: No oropharyngeal exudate.     Comments: Dry MM Eyes:     General: No scleral icterus.  Right eye: No discharge.        Left eye: No discharge.     Conjunctiva/sclera: Conjunctivae normal.     Pupils: Pupils are equal, round, and reactive to light.  Neck:     Musculoskeletal: Normal range of motion and neck supple.     Thyroid: No thyromegaly.     Vascular: No JVD.  Cardiovascular:     Rate and Rhythm: Normal rate and regular rhythm.     Heart sounds: Normal heart sounds. No murmur. No friction rub. No gallop.   Pulmonary:     Effort: Pulmonary effort is normal. No respiratory distress.     Breath sounds: Normal breath sounds. No wheezing or rales.  Abdominal:     General: Bowel sounds are normal. There is no distension.     Palpations: Abdomen is soft. There is no mass.     Tenderness: There is no abdominal tenderness.     Comments: Totally soft and non tneder  Musculoskeletal: Normal range of motion.        General: No tenderness.  Lymphadenopathy:     Cervical: No cervical adenopathy.  Skin:    General: Skin is warm and dry.     Findings: Rash present. No erythema.     Comments: 0.6mm red nodule in the R medial proximal thight without any surrounding cellulitis - no fluctuance  Neurological:     Mental Status: She is alert.     Coordination: Coordination normal.     Comments: Normal speech and coordination  Psychiatric:        Behavior: Behavior normal.      ED Treatments / Results  Labs (all labs ordered are listed, but only abnormal  results are displayed) Labs Reviewed  URINALYSIS, ROUTINE W REFLEX MICROSCOPIC - Abnormal; Notable for the following components:      Result Value   APPearance HAZY (*)    Hgb urine dipstick LARGE (*)    Ketones, ur 20 (*)    All other components within normal limits  COMPREHENSIVE METABOLIC PANEL - Abnormal; Notable for the following components:   CO2 20 (*)    AST 84 (*)    ALT 115 (*)    All other components within normal limits  URINE CULTURE  CBC WITH DIFFERENTIAL/PLATELET  MAGNESIUM    EKG EKG Interpretation  Date/Time:  Monday April 23 2019 13:06:58 EDT Ventricular Rate:  59 PR Interval:    QRS Duration: 149 QT Interval:  510 QTC Calculation: 506 R Axis:   105 Text Interpretation:  Sinus rhythm Prolonged PR interval Nonspecific intraventricular conduction delay Probable anterolateral infarct, old Minimal ST depression, lateral leads Baseline wander in lead(s) V1 since last tracing no significant change Confirmed by Noemi Chapel [phone]) on 04/23/2019 1:24:20 PM   Radiology Ct Renal Stone Study  Result Date: 04/23/2019 CLINICAL DATA:  Nausea and vomiting over the last 3 days. Recent history of cellulitis of the right lower extremity. EXAM: CT ABDOMEN AND PELVIS WITHOUT CONTRAST TECHNIQUE: Multidetector CT imaging of the abdomen and pelvis was performed following the standard protocol without IV contrast. COMPARISON:  Abdominal ultrasound 09/29/2016 FINDINGS: Lower chest: Minimal scarring or atelectasis at both lung bases. Hepatobiliary: Previous cholecystectomy. No focal liver lesion of significance. Calcified granuloma in the caudal right lobe. Pancreas: Normal Spleen: Normal Adrenals/Urinary Tract: Adrenal glands are normal. Kidneys are normal. No cyst, mass, stone or hydronephrosis. No stone in the bladder. No significant bladder finding. Stomach/Bowel: Left colon diverticulosis without imaging evidence of diverticulitis. No  other bowel finding. Vascular/Lymphatic: Aortic  atherosclerosis. No aneurysm. IVC appears unremarkable. No retroperitoneal adenopathy. Reproductive: Negative.  No pelvic mass. Other: No free fluid or air. Musculoskeletal: Chronic spinal curvature and degenerative changes which could be symptomatic. IMPRESSION: No evidence of urinary tract pathology. No evidence of renal stone disease. No acute bowel or solid organ pathology identified. Spinal curvature and degenerative changes without acute finding by CT. Findings could certainly be associated with back pain. Electronically Signed   By: Nelson Chimes M.D.   On: 04/23/2019 15:51    Procedures Procedures (including critical care time)  Medications Ordered in ED Medications  0.9 %  sodium chloride infusion ( Intravenous New Bag/Given 04/23/19 1322)  ondansetron (ZOFRAN) injection 4 mg (4 mg Intravenous Given 04/23/19 1322)  ondansetron (ZOFRAN-ODT) disintegrating tablet 4 mg (4 mg Oral Given 04/23/19 1613)     Initial Impression / Assessment and Plan / ED Course  I have reviewed the triage vital signs and the nursing notes.  Pertinent labs & imaging results that were available during my care of the patient were reviewed by me and considered in my medical decision making (see chart for details).  Clinical Course as of Apr 23 1615  Mon Apr 23, 2019  1500 My interpretation of the labs is that the pt has no increased WBC count, she has slight elevation in her LFT's but nml bili.  UA with Ketones and RBC's. Will check for KS possibly causing nasuea. No uti present, no anemia - mag normal   [BM]  1556 I have looked at and interpreted the CT - no signs of Uro tract pathology - no explanation for blood in urine - will refer to urology for cystoscopy consideration - pt agreeable - labs look great - bactrim not needed - skin lesion benign - will give mupirocin and dressing - stable for d/c - no hyperK and no renal dysfunction.     [BM]    Clinical Course User Index [BM] Noemi Chapel, MD       Possible reaction to medicine - she has n/v and has had some weight loss assocaited with this - she may have some renal dysfunction, she is in benicar so with sulfa med - will r/o hyperkalemia, renal failure, UTI and check EKG - will need fluid rehydration - the pt is not in distress but does appear dehdyrated.  Hx of gastroparesis which is likely culprit Pt informed Will add mupirocin topical zofran by mouth Stable for d/c - after PO trial  Final Clinical Impressions(s) / ED Diagnoses   Final diagnoses:  Nausea and vomiting, intractability of vomiting not specified, unspecified vomiting type  Gastroparesis    ED Discharge Orders         Ordered    ondansetron (ZOFRAN ODT) 4 MG disintegrating tablet  Every 8 hours PRN     04/23/19 1615           Noemi Chapel, MD 04/23/19 1617

## 2019-04-24 LAB — URINE CULTURE: Culture: NO GROWTH

## 2019-05-01 ENCOUNTER — Ambulatory Visit: Payer: Medicare Other

## 2019-05-08 ENCOUNTER — Encounter: Payer: Self-pay | Admitting: General Surgery

## 2019-05-09 ENCOUNTER — Encounter: Payer: Self-pay | Admitting: Internal Medicine

## 2019-05-09 ENCOUNTER — Other Ambulatory Visit: Payer: Self-pay

## 2019-05-09 ENCOUNTER — Ambulatory Visit (INDEPENDENT_AMBULATORY_CARE_PROVIDER_SITE_OTHER): Payer: Medicare Other | Admitting: Internal Medicine

## 2019-05-09 VITALS — Ht 61.0 in | Wt 128.0 lb

## 2019-05-09 DIAGNOSIS — I2510 Atherosclerotic heart disease of native coronary artery without angina pectoris: Secondary | ICD-10-CM

## 2019-05-09 DIAGNOSIS — R5383 Other fatigue: Secondary | ICD-10-CM

## 2019-05-09 DIAGNOSIS — R748 Abnormal levels of other serum enzymes: Secondary | ICD-10-CM

## 2019-05-09 DIAGNOSIS — K3184 Gastroparesis: Secondary | ICD-10-CM | POA: Diagnosis not present

## 2019-05-09 DIAGNOSIS — R4589 Other symptoms and signs involving emotional state: Secondary | ICD-10-CM | POA: Insufficient documentation

## 2019-05-09 DIAGNOSIS — F418 Other specified anxiety disorders: Secondary | ICD-10-CM | POA: Diagnosis not present

## 2019-05-09 HISTORY — DX: Abnormal levels of other serum enzymes: R74.8

## 2019-05-09 HISTORY — DX: Other symptoms and signs involving emotional state: R45.89

## 2019-05-09 HISTORY — DX: Other specified anxiety disorders: F41.8

## 2019-05-09 NOTE — Progress Notes (Signed)
TELEHEALTH ENCOUNTER IN SETTING OF COVID-19 PANDEMIC - REQUESTED BY PATIENT SERVICE PROVIDED BY TELEMEDECINE - TYPE: Telephone, A/V not possible PATIENT LOCATION: Home PATIENT HAS CONSENTED TO TELEHEALTH VISIT PROVIDER LOCATION: OFFICE REFERRING PROVIDER: Self, PCP is [name], [name] PARTICIPANTS OTHER THAN PATIENT: None TIME SPENT ON CALL: 25 minutes    Erica [name] 85 y.o. 1930-08-31 [id number]  Assessment & Plan:   Encounter Diagnoses  Name Primary?  . Gastroparesis Yes  . Abnormal transaminases   . Fatigue, unspecified type   . Anxiety about health     Gastroparesis Flare vs virus vs med reaction  Abnormal transaminases Long-standing, liver ok on imaging Doubt causing problems ? Amiodarone We will ask her to recheck these at her primary care office.  We will make a request from her PCP. I suspect they might of been increased at the time of her acute illness that she had over her baseline.  Based upon imaging, age and overall situation I am not convinced we need to investigate further beyond rechecking these but we will see what numbers come up.  Anxiety about health Tried to reassure  I appreciate the opportunity to care for this patient. CC: [name], [name]     Subjective:   Chief Complaint: Vomiting  HPI [name] was in the ER at any pain in late April she been vomiting for about a week and said she lost weight and did not feel well so she was sent to the ER through primary care.  She had a spot on 1 of her legs there was a question of a spider bite, she had started Septra for that and there is some question whether that was related to the vomiting but she has gastroparesis and gets spells at times.  Her transaminases were elevated beyond baseline she does have transaminases in the 1-1/2 times abnormal range over time.  She says she feels bad and fatigued and has no energy.  She is eating better but still not eating normally.  She has some  mild discomfort in the right lower quadrant at times.  Bowel habits are regular I think.  As usual she asks what can be done and how she can be fixed.  She had hematuria in the ER but that is a chronic issue and she takes Xarelto along with amiodarone for her atrial fibrillation.  She was taken off the Septra and put on mupirocin ointment and she said the skin lesion that was a question of a spider bite or something else is almost healed.  She has known hemorrhoids that occasionally bleed and she did have some bright red blood on the toilet paper recently.  This is not unusual. Allergies  Allergen Reactions  . Contrast Media [Iodinated Diagnostic Agents] Shortness Of Breath, Nausea Only and Swelling  . Iodine Shortness Of Breath, Nausea Only and Swelling  . Iohexol Hives and Swelling     Desc: hives, swelling over 5 yrs ago-pt has never been pre-medicated--was told to not have iv contrast ever   . Ioxaglate Nausea Only, Shortness Of Breath and Swelling  . Metoclopramide Other (See Comments)    Mouth tremors, insomnia, and irritability   Current Meds  Medication Sig  . amiodarone (PACERONE) 200 MG tablet TAKE 1 TABLET DAILY  . amLODipine (NORVASC) 5 MG tablet Take 1 tablet (5 mg total) by mouth daily.  Marland Kitchen atorvastatin (LIPITOR) 10 MG tablet   . Cholecalciferol (VITAMIN D PO) Take 1 tablet by mouth daily.  Marland Kitchen  ELIQUIS 2.5 MG TABS tablet TAKE (1) TABLET TWICE A DAY.  Marland Kitchen escitalopram (LEXAPRO) 20 MG tablet TAKE (2) TABLETS DAILY  . esomeprazole (NEXIUM) 40 MG capsule Take 1 capsule (40 mg total) by mouth 2 (two) times daily before a meal.  . furosemide (LASIX) 20 MG tablet TAKE 1 TABLET ONCE DAILY AS NEEDED FOR EDEMA OR FLUID  . hydrocortisone (ANUSOL-HC) 2.5 % rectal cream Place 1 application rectally 2 (two) times daily.  Marland Kitchen levothyroxine (SYNTHROID, LEVOTHROID) 88 MCG tablet TAKE (1) TABLET DAILY BE- FORE BREAKFAST.  . Melatonin 5 MG CAPS Take 5 mg by mouth at bedtime as needed (sleep).  .  Multiple Vitamin (MULTIVITAMIN WITH MINERALS) TABS tablet Take 1 tablet by mouth daily.  . mupirocin cream (BACTROBAN) 2 % Apply 1 application topically 2 (two) times daily.  Marland Kitchen olmesartan (BENICAR) 40 MG tablet Take 1 tablet (40 mg total) by mouth daily.  . ondansetron (ZOFRAN ODT) 4 MG disintegrating tablet Take 1 tablet (4 mg total) by mouth every 8 (eight) hours as needed for nausea.  Marland Kitchen OVER THE COUNTER MEDICATION IB gard as needed  . Wheat Dextrin (BENEFIBER) POWD Take 1 Dose by mouth daily. Take daily    Past Medical History:  Diagnosis Date  . Abnormal transaminases 05/09/2019  . Abnormality of gait 08/25/2016  . Adenomatous rectal polyp   . Anxiety about health 05/09/2019  . Arthritis 04/03/2018   L hip  . Atrial fibrillation ([place])   . Bleeding internal hemorrhoids   . Cataract   . Chronic back pain   . Depression   . Diverticulosis of colon (without mention of hemorrhage)   . Gastroparesis   . GERD (gastroesophageal reflux disease)   . Hiatal hernia 09/1999   3cm  . Hyperlipidemia   . Hypertension   . Hypothyroid   . Ischemic colitis ([place])   . Left foot drop 08/25/2016  . Lichen sclerosus et atrophicus of the vulva   . MVP (mitral valve prolapse)   . Osteoporosis   . Sleep apnea   . TIA (transient ischemic attack)   . Weakness    Past Surgical History:  Procedure Laterality Date  . CARDIAC CATHETERIZATION N/A 02/03/2016   Procedure: Coronary/Graft Angiography;  Surgeon: Charolette Forward, MD;  Location: [place] [facility];  Service: Cardiovascular;  Laterality: N/A;  . CATARACT EXTRACTION    . CHOLECYSTECTOMY    . COLONOSCOPY     Multiple, polyps  . ESOPHAGEAL MANOMETRY N/A 06/14/2016   Procedure: ESOPHAGEAL MANOMETRY (EM);  Surgeon: Gatha Mayer, MD;  Location: [facility];  Service: Endoscopy;  Laterality: N/A;  . LEFT HEART CATHETERIZATION WITH CORONARY ANGIOGRAM N/A 07/17/2013   Procedure: LEFT HEART CATHETERIZATION WITH CORONARY ANGIOGRAM;  Surgeon: Clent Demark, MD;  Location: [place] CATH LAB;  Service: Cardiovascular;  Laterality: N/A;  . SIGMOIDOSCOPY  08/23/2011  . TUBAL LIGATION    . UPPER GASTROINTESTINAL ENDOSCOPY  08/23/2011   Normal   Social History   Social History Narrative   Lives at home alone.   Right-handed.   2 cups caffeine most days.   family history includes Colon cancer in her paternal aunt; Esophageal cancer in her father; Lung cancer in her brother; Stomach cancer in her brother; Stroke in her mother.   Review of Systems Wt Readings from Last 3 Encounters:  05/09/19 128 lb (58.1 kg)  04/23/19 128 lb 8 oz (58.3 kg)  04/23/19 119 lb (54 kg)

## 2019-05-09 NOTE — Patient Instructions (Signed)
It was good to speak to you today probably.  I am sorry you had an episode of vomiting and still not feel back to normal.  Based upon all of the available information the testing and where you are now I do not think there is anything really bad or serious going on.  I hope that reassures you some.  I wish I could make you feel all better but given your age and the health problems you do have and being shut in with the virus restrictions I suspect these are all working together to cause some of how you feel now.  We did talk about your abnormal liver chemistries, that has been present for a while which is a good sign in a way, your liver looks fine on previous ultrasound and the CT scan you had at [facility].  Sometimes medications cause some abnormal liver chemistries and people live with that just fine.   I am going to work with [name] to get some tests done up in [facility] and when I see those results we will call.  I appreciate the opportunity to care for you. Gatha Mayer, MD, [facility]

## 2019-05-09 NOTE — Assessment & Plan Note (Signed)
Tried to reassure

## 2019-05-09 NOTE — Assessment & Plan Note (Signed)
Flare vs virus vs med reaction

## 2019-05-09 NOTE — Assessment & Plan Note (Addendum)
Long-standing, liver ok on imaging Doubt causing problems ? Amiodarone We will ask her to recheck these at her primary care office.  We will make a request from her PCP. I suspect they might of been increased at the time of her acute illness that she had over her baseline.  Based upon imaging, age and overall situation I am not convinced we need to investigate further beyond rechecking these but we will see what numbers come up.

## 2019-05-10 ENCOUNTER — Encounter: Payer: Medicare Other | Admitting: Nurse Practitioner

## 2019-05-10 ENCOUNTER — Telehealth: Payer: Self-pay | Admitting: Internal Medicine

## 2019-05-10 DIAGNOSIS — I480 Paroxysmal atrial fibrillation: Secondary | ICD-10-CM | POA: Diagnosis not present

## 2019-05-10 DIAGNOSIS — F419 Anxiety disorder, unspecified: Secondary | ICD-10-CM | POA: Diagnosis not present

## 2019-05-10 DIAGNOSIS — I2510 Atherosclerotic heart disease of native coronary artery without angina pectoris: Secondary | ICD-10-CM | POA: Diagnosis not present

## 2019-05-10 DIAGNOSIS — E039 Hypothyroidism, unspecified: Secondary | ICD-10-CM | POA: Diagnosis not present

## 2019-05-10 DIAGNOSIS — E785 Hyperlipidemia, unspecified: Secondary | ICD-10-CM | POA: Diagnosis not present

## 2019-05-10 DIAGNOSIS — I10 Essential (primary) hypertension: Secondary | ICD-10-CM | POA: Diagnosis not present

## 2019-05-10 DIAGNOSIS — R0789 Other chest pain: Secondary | ICD-10-CM | POA: Diagnosis not present

## 2019-05-10 DIAGNOSIS — M1990 Unspecified osteoarthritis, unspecified site: Secondary | ICD-10-CM | POA: Diagnosis not present

## 2019-05-10 NOTE — Telephone Encounter (Signed)
Have you heard anything from [place]. Mrs. Gibb just called back and said they called her and said they were waiting on [place].

## 2019-05-10 NOTE — Telephone Encounter (Signed)
Patient aware of the appointment.

## 2019-05-10 NOTE — Progress Notes (Signed)
Patient ID: Erica [name], female   DOB: 15-Sep-1930, 85 y.o.   MRN: [id number] Erroneous

## 2019-05-10 NOTE — Telephone Encounter (Signed)
Yes  [name] [name] is working on it.  It is not urgent so reassure her

## 2019-05-10 NOTE — Telephone Encounter (Signed)
-----   Message from [name], [place] sent at 05/10/2019 11:42 AM EDT ----- Please schedule to be seen for follow up ----- Message ----- From: Gatha Mayer, MD Sent: 05/09/2019   5:17 PM EDT To: [name], [name]  Hi Shelah Lewandowsky,  [facility] you are hanging in there with the pandemic mass.  Would you be able to arrange for Polly to have some LFTs checked to follow-up to see that they are coming down?  It would be more convenient for her to come to your place and do that.  Please let me know.  Thanks  Glendell Docker

## 2019-05-10 NOTE — Telephone Encounter (Signed)
Appt made for 5/18 for follow up and labs

## 2019-05-10 NOTE — Telephone Encounter (Signed)
I hav messaged her PCP about it and am waiting to hear - so she can do it at [place] her to please be patient

## 2019-05-10 NOTE — Telephone Encounter (Signed)
Please let me know what labs you wanted ordered Sir, thank you.

## 2019-05-10 NOTE — Telephone Encounter (Signed)
I told Erica [name] to hand in there. She said when we hear something to let her daughter Erica [name] know please.

## 2019-05-11 ENCOUNTER — Other Ambulatory Visit: Payer: Self-pay

## 2019-05-14 ENCOUNTER — Ambulatory Visit (INDEPENDENT_AMBULATORY_CARE_PROVIDER_SITE_OTHER): Payer: Medicare Other | Admitting: Nurse Practitioner

## 2019-05-14 ENCOUNTER — Encounter: Payer: Self-pay | Admitting: Nurse Practitioner

## 2019-05-14 ENCOUNTER — Other Ambulatory Visit: Payer: Self-pay

## 2019-05-14 VITALS — BP 152/70 | HR 57 | Temp 97.6°F | Ht 61.0 in | Wt 132.0 lb

## 2019-05-14 DIAGNOSIS — B029 Zoster without complications: Secondary | ICD-10-CM

## 2019-05-14 DIAGNOSIS — I2510 Atherosclerotic heart disease of native coronary artery without angina pectoris: Secondary | ICD-10-CM

## 2019-05-14 DIAGNOSIS — K3184 Gastroparesis: Secondary | ICD-10-CM | POA: Diagnosis not present

## 2019-05-14 MED ORDER — VALACYCLOVIR HCL 1 G PO TABS: 1000.0000 mg | ORAL_TABLET | Freq: Two times a day (BID) | ORAL | 0 refills | Status: DC

## 2019-05-14 NOTE — Progress Notes (Signed)
   Subjective:    Patient ID: Erica [name], female    DOB: 09/10/1930, 85 y.o.   MRN: [id number]   Chief Complaint: Hospitalization Follow-up   HPI Patient comes in today for hospital follow up. She was sent to the ER on 04/23/19 with nausea vomiting and 14 lb weight loss. They did blood work and CT scan which were all negative.She was sent home with an appointment to see Dr. Carlean Purl GI. She had appointment with him on 05/09/19 and was diagnosed with gastro paresis, fatigue and anxiety. Her liver enzymes were elevated so she needs to have those rechecked today. DR Carlean Purl did not fill like her LFT's were causing her problem but he wanted them rechecked today. Today she feels much better. But she says that she still has a poor appetite. She is c/o pain right flank for 2 days.  Review of Systems  Constitutional: Negative for activity change and appetite change.  HENT: Negative.   Eyes: Negative for pain.  Respiratory: Negative for shortness of breath.   Cardiovascular: Negative for chest pain, palpitations and leg swelling.  Gastrointestinal: Positive for blood in stool (bright red only when wiping), constipation (occasional) and nausea. Negative for abdominal pain and diarrhea.  Endocrine: Negative for polydipsia.  Genitourinary: Negative.   Skin: Negative for rash.       vesiclar lesion with red streak coming from it.  Neurological: Negative for dizziness, weakness and headaches.  Hematological: Does not bruise/bleed easily.  Psychiatric/Behavioral: Negative.   All other systems reviewed and are negative.      Objective:   Physical Exam Vitals signs and nursing note reviewed.  Constitutional:      Appearance: Normal appearance.  Cardiovascular:     Rate and Rhythm: Normal rate and regular rhythm.     Heart sounds: Normal heart sounds.  Pulmonary:     Effort: Pulmonary effort is normal.     Breath sounds: Normal breath sounds.  Skin:    General: Skin is warm and dry.   Neurological:     General: No focal deficit present.     Mental Status: She is alert and oriented to person, place, and time.  Psychiatric:        Mood and Affect: Mood normal.        Behavior: Behavior normal.    BP (!) 152/70   Pulse (!) 57   Temp 97.6 F (36.4 C) (Oral)   Ht _0  (1.549 m)   Wt 132 lb (59.9 kg)   BMI 24.94 kg/m         Assessment & Plan:  Erica [name] comes in today with chief complaint of Hospitalization Follow-up   Diagnosis and orders addressed:  1. Gastroparesis Eat slowly Allow time for food to digest - CMP14+EGFR  2. Herpes zoster without complication Watch for increasing rash Do not scratch or pick at area - valACYclovir (VALTREX) 1000 MG tablet; Take 1 tablet (1,000 mg total) by mouth 2 (two) times daily.  Dispense: 21 tablet; Refill: 0   Labs pending Health Maintenance reviewed Diet and exercise encouraged  Follow up plan: prn   Mary-Margaret Hassell Done, [name]

## 2019-05-15 LAB — CMP14+EGFR
ALT: 36 IU/L — ABNORMAL HIGH (ref 0–32)
AST: 32 IU/L (ref 0–40)
Albumin/Globulin Ratio: 2.1 (ref 1.2–2.2)
Albumin: 4.1 g/dL (ref 3.6–4.6)
Alkaline Phosphatase: 66 IU/L (ref 39–117)
BUN/Creatinine Ratio: 13 (ref 12–28)
BUN: 12 mg/dL (ref 8–27)
Bilirubin Total: 0.2 mg/dL (ref 0.0–1.2)
CO2: 24 mmol/L (ref 20–29)
Calcium: 9.5 mg/dL (ref 8.7–10.3)
Chloride: 101 mmol/L (ref 96–106)
Creatinine, Ser: 0.89 mg/dL (ref 0.57–1.00)
GFR calc Af Amer: 68 mL/min/{1.73_m2} (ref >59)
GFR calc non Af Amer: 59 mL/min/{1.73_m2} — ABNORMAL LOW (ref >59)
Globulin, Total: 2 g/dL (ref 1.5–4.5)
Glucose: 96 mg/dL (ref 65–99)
Potassium: 5.1 mmol/L (ref 3.5–5.2)
Sodium: 137 mmol/L (ref 134–144)
Total Protein: 6.1 g/dL (ref 6.0–8.5)

## 2019-05-16 ENCOUNTER — Telehealth: Payer: Self-pay | Admitting: Nurse Practitioner

## 2019-05-16 NOTE — Telephone Encounter (Signed)
Provider has not reviewed yet. Sent message to provider

## 2019-05-17 ENCOUNTER — Telehealth: Payer: Self-pay | Admitting: Nurse Practitioner

## 2019-05-17 NOTE — Telephone Encounter (Signed)
pt aware 

## 2019-05-24 ENCOUNTER — Other Ambulatory Visit: Payer: Self-pay | Admitting: Nurse Practitioner

## 2019-05-24 DIAGNOSIS — F3341 Major depressive disorder, recurrent, in partial remission: Secondary | ICD-10-CM

## 2019-05-24 DIAGNOSIS — I2510 Atherosclerotic heart disease of native coronary artery without angina pectoris: Secondary | ICD-10-CM

## 2019-05-24 DIAGNOSIS — I10 Essential (primary) hypertension: Secondary | ICD-10-CM

## 2019-05-24 DIAGNOSIS — E039 Hypothyroidism, unspecified: Secondary | ICD-10-CM

## 2019-05-25 ENCOUNTER — Other Ambulatory Visit: Payer: Self-pay | Admitting: Nurse Practitioner

## 2019-05-25 DIAGNOSIS — I2510 Atherosclerotic heart disease of native coronary artery without angina pectoris: Secondary | ICD-10-CM

## 2019-06-11 ENCOUNTER — Ambulatory Visit
Admission: RE | Admit: 2019-06-11 | Discharge: 2019-06-11 | Disposition: A | Discharge status: Home / Self Care | Payer: Medicare Other | Source: Ambulatory Visit | Attending: Nurse Practitioner | Admitting: Nurse Practitioner

## 2019-06-11 ENCOUNTER — Other Ambulatory Visit: Payer: Self-pay

## 2019-06-11 DIAGNOSIS — Z1231 Encounter for screening mammogram for malignant neoplasm of breast: Secondary | ICD-10-CM

## 2019-06-22 ENCOUNTER — Other Ambulatory Visit: Payer: Self-pay | Admitting: Nurse Practitioner

## 2019-06-22 DIAGNOSIS — I10 Essential (primary) hypertension: Secondary | ICD-10-CM

## 2019-06-22 DIAGNOSIS — I2510 Atherosclerotic heart disease of native coronary artery without angina pectoris: Secondary | ICD-10-CM

## 2019-06-25 DIAGNOSIS — D049 Carcinoma in situ of skin, unspecified: Secondary | ICD-10-CM | POA: Diagnosis not present

## 2019-06-25 DIAGNOSIS — L570 Actinic keratosis: Secondary | ICD-10-CM | POA: Diagnosis not present

## 2019-07-20 ENCOUNTER — Other Ambulatory Visit: Payer: Self-pay | Admitting: Nurse Practitioner

## 2019-07-20 DIAGNOSIS — I2510 Atherosclerotic heart disease of native coronary artery without angina pectoris: Secondary | ICD-10-CM

## 2019-07-23 NOTE — Telephone Encounter (Signed)
Left message to please call our office to schedule follow up with your provider for future refills.

## 2019-07-23 NOTE — Telephone Encounter (Signed)
MMM NTBS 30 days given 06/22/19

## 2019-07-24 ENCOUNTER — Other Ambulatory Visit: Payer: Self-pay | Admitting: Nurse Practitioner

## 2019-07-24 DIAGNOSIS — I2510 Atherosclerotic heart disease of native coronary artery without angina pectoris: Secondary | ICD-10-CM

## 2019-07-25 ENCOUNTER — Other Ambulatory Visit: Payer: Self-pay | Admitting: Nurse Practitioner

## 2019-07-25 DIAGNOSIS — I2510 Atherosclerotic heart disease of native coronary artery without angina pectoris: Secondary | ICD-10-CM

## 2019-07-25 MED ORDER — APIXABAN 2.5 MG PO TABS: ORAL_TABLET | ORAL | 0 refills | Status: DC

## 2019-07-25 NOTE — Telephone Encounter (Signed)
OV 08/27/19, pt aware refill sent to [facility]

## 2019-08-09 DIAGNOSIS — I10 Essential (primary) hypertension: Secondary | ICD-10-CM | POA: Diagnosis not present

## 2019-08-09 DIAGNOSIS — I2510 Atherosclerotic heart disease of native coronary artery without angina pectoris: Secondary | ICD-10-CM | POA: Diagnosis not present

## 2019-08-09 DIAGNOSIS — F419 Anxiety disorder, unspecified: Secondary | ICD-10-CM | POA: Diagnosis not present

## 2019-08-09 DIAGNOSIS — E039 Hypothyroidism, unspecified: Secondary | ICD-10-CM | POA: Diagnosis not present

## 2019-08-09 DIAGNOSIS — E785 Hyperlipidemia, unspecified: Secondary | ICD-10-CM | POA: Diagnosis not present

## 2019-08-09 DIAGNOSIS — M1990 Unspecified osteoarthritis, unspecified site: Secondary | ICD-10-CM | POA: Diagnosis not present

## 2019-08-09 DIAGNOSIS — I480 Paroxysmal atrial fibrillation: Secondary | ICD-10-CM | POA: Diagnosis not present

## 2019-08-17 ENCOUNTER — Other Ambulatory Visit: Payer: Self-pay

## 2019-08-17 ENCOUNTER — Other Ambulatory Visit: Payer: Medicare Other

## 2019-08-22 ENCOUNTER — Other Ambulatory Visit: Payer: Self-pay | Admitting: Nurse Practitioner

## 2019-08-22 DIAGNOSIS — I2510 Atherosclerotic heart disease of native coronary artery without angina pectoris: Secondary | ICD-10-CM

## 2019-08-22 DIAGNOSIS — I10 Essential (primary) hypertension: Secondary | ICD-10-CM

## 2019-08-22 DIAGNOSIS — E039 Hypothyroidism, unspecified: Secondary | ICD-10-CM

## 2019-08-22 DIAGNOSIS — F3341 Major depressive disorder, recurrent, in partial remission: Secondary | ICD-10-CM

## 2019-08-24 ENCOUNTER — Other Ambulatory Visit: Payer: Self-pay

## 2019-08-27 ENCOUNTER — Ambulatory Visit (INDEPENDENT_AMBULATORY_CARE_PROVIDER_SITE_OTHER): Payer: Medicare Other | Admitting: Nurse Practitioner

## 2019-08-27 ENCOUNTER — Other Ambulatory Visit: Payer: Self-pay

## 2019-08-27 ENCOUNTER — Encounter: Payer: Self-pay | Admitting: Nurse Practitioner

## 2019-08-27 VITALS — BP 159/71 | HR 50 | Temp 97.2°F | Ht 61.0 in | Wt 130.0 lb

## 2019-08-27 DIAGNOSIS — E782 Mixed hyperlipidemia: Secondary | ICD-10-CM | POA: Diagnosis not present

## 2019-08-27 DIAGNOSIS — K219 Gastro-esophageal reflux disease without esophagitis: Secondary | ICD-10-CM

## 2019-08-27 DIAGNOSIS — F418 Other specified anxiety disorders: Secondary | ICD-10-CM | POA: Diagnosis not present

## 2019-08-27 DIAGNOSIS — I10 Essential (primary) hypertension: Secondary | ICD-10-CM

## 2019-08-27 DIAGNOSIS — I2510 Atherosclerotic heart disease of native coronary artery without angina pectoris: Secondary | ICD-10-CM

## 2019-08-27 DIAGNOSIS — B372 Candidiasis of skin and nail: Secondary | ICD-10-CM

## 2019-08-27 DIAGNOSIS — R7989 Other specified abnormal findings of blood chemistry: Secondary | ICD-10-CM | POA: Diagnosis not present

## 2019-08-27 DIAGNOSIS — F3341 Major depressive disorder, recurrent, in partial remission: Secondary | ICD-10-CM | POA: Diagnosis not present

## 2019-08-27 DIAGNOSIS — E559 Vitamin D deficiency, unspecified: Secondary | ICD-10-CM | POA: Diagnosis not present

## 2019-08-27 DIAGNOSIS — M21372 Foot drop, left foot: Secondary | ICD-10-CM

## 2019-08-27 DIAGNOSIS — E039 Hypothyroidism, unspecified: Secondary | ICD-10-CM | POA: Diagnosis not present

## 2019-08-27 DIAGNOSIS — R4589 Other symptoms and signs involving emotional state: Secondary | ICD-10-CM

## 2019-08-27 DIAGNOSIS — Z6827 Body mass index (BMI) 27.0-27.9, adult: Secondary | ICD-10-CM | POA: Diagnosis not present

## 2019-08-27 MED ORDER — ESCITALOPRAM OXALATE 20 MG PO TABS: 40.0000 mg | ORAL_TABLET | Freq: Every day | ORAL | 1 refills | Status: DC

## 2019-08-27 MED ORDER — FUROSEMIDE 20 MG PO TABS: 20.0000 mg | ORAL_TABLET | Freq: Every day | ORAL | 1 refills | Status: DC

## 2019-08-27 MED ORDER — OLMESARTAN MEDOXOMIL 40 MG PO TABS: 40.0000 mg | ORAL_TABLET | Freq: Every day | ORAL | 1 refills | Status: DC

## 2019-08-27 MED ORDER — LEVOTHYROXINE SODIUM 88 MCG PO TABS: 88.0000 ug | ORAL_TABLET | Freq: Every day | ORAL | 1 refills | Status: DC

## 2019-08-27 MED ORDER — ESOMEPRAZOLE MAGNESIUM 40 MG PO CPDR: 40.0000 mg | DELAYED_RELEASE_CAPSULE | Freq: Two times a day (BID) | ORAL | 1 refills | Status: DC

## 2019-08-27 MED ORDER — NYSTATIN 100000 UNIT/ML MT SUSP: 5.0000 mL | Freq: Four times a day (QID) | OROMUCOSAL | 0 refills | Status: DC

## 2019-08-27 MED ORDER — AMLODIPINE BESYLATE 5 MG PO TABS: 5.0000 mg | ORAL_TABLET | Freq: Every day | ORAL | 1 refills | Status: DC

## 2019-08-27 NOTE — Progress Notes (Signed)
 Subjective:    Patient ID: Erica [name], female    DOB: 07/30/1930, 85 y.o.   MRN: [id number]   Chief Complaint: Medical Management of Chronic Issues (Check place on bottom)    HPI:  1. Essential hypertension No c/o chest pain, sob or headache. Does not check blood pressure at home. BP Readings from Last 3 Encounters:  08/27/19 (!) 159/71  05/14/19 (!) 152/70  04/23/19 (!) 132/58     2. Gastroesophageal reflux disease without esophagitis Is on daily nexium and is working well.  3. Hypothyroidism, unspecified type Is on levothyroxin 88mcg daily and has no complaints. Lab Results  Component Value Date   TSH 1.560 08/25/2018     4. Mixed hyperlipidemia Does not watch diet, but says she has a very poor appetite. Lab Results  Component Value Date   CHOL 174 08/25/2018   HDL 53 08/25/2018   LDLCALC 92 08/25/2018   TRIG 145 08/25/2018   CHOLHDL 3.3 08/25/2018      5. Left foot drop Has had for years- makes walking difficult at times- sh e denies any falls  6. Anxiety about health Worries all the time about her health  7. Recurrent major depressive disorder, in partial remission (HCC) Is on lexapro and is doing well. Depression screen PHQ 2/9 08/27/2019 05/14/2019 04/23/2019  Decreased Interest 0 0 0  Down, Depressed, Hopeless 0 0 0  PHQ - 2 Score 0 0 0  Altered sleeping - - 0  Tired, decreased energy - - 0  Change in appetite - - 0  Feeling bad or failure about yourself  - - 0  Trouble concentrating - - 0  Moving slowly or fidgety/restless - - 0  Suicidal thoughts - - 0  PHQ-9 Score - - 0  Difficult doing work/chores - - Not difficult at all  Some recent data might be hidden     8. BMI 27.0-27.9,adult No recent weight changes.    Outpatient Encounter Medications as of 08/27/2019  Medication Sig  . amiodarone (PACERONE) 200 MG tablet TAKE 1 TABLET DAILY  . amLODipine (NORVASC) 5 MG tablet TAKE 1 TABLET DAILY  . atorvastatin (LIPITOR) 10 MG tablet    . Cholecalciferol (VITAMIN D PO) Take 1 tablet by mouth daily.  . ELIQUIS 2.5 MG TABS tablet TAKE (1) TABLET TWICE A DAY.  . escitalopram (LEXAPRO) 20 MG tablet TAKE (2) TABLETS DAILY  . esomeprazole (NEXIUM) 40 MG capsule Take 1 capsule (40 mg total) by mouth 2 (two) times daily before a meal.  . furosemide (LASIX) 20 MG tablet TAKE 1 TABLET ONCE DAILY AS NEEDED FOR EDEMA OR FLUID  . hydrocortisone (ANUSOL-HC) 2.5 % rectal cream Place 1 application rectally 2 (two) times daily.  . levothyroxine (SYNTHROID) 88 MCG tablet TAKE (1) TABLET DAILY BE- FORE BREAKFAST.  . Melatonin 5 MG CAPS Take 5 mg by mouth at bedtime as needed (sleep).  . Multiple Vitamin (MULTIVITAMIN WITH MINERALS) TABS tablet Take 1 tablet by mouth daily.  . mupirocin cream (BACTROBAN) 2 % Apply 1 application topically 2 (two) times daily.  . olmesartan (BENICAR) 40 MG tablet TAKE 1 TABLET DAILY  . ondansetron (ZOFRAN ODT) 4 MG disintegrating tablet Take 1 tablet (4 mg total) by mouth every 8 (eight) hours as needed for nausea.  . OVER THE COUNTER MEDICATION IB gard as needed  . valACYclovir (VALTREX) 1000 MG tablet Take 1 tablet (1,000 mg total) by mouth 2 (two) times daily.  . Wheat Dextrin (BENEFIBER) POWD Take   1 Dose by mouth daily. Take daily      Past Surgical History:  Procedure Laterality Date  . CARDIAC CATHETERIZATION N/A 02/03/2016   Procedure: Coronary/Graft Angiography;  Surgeon: Charolette Forward, MD;  Location: [facility];  Service: Cardiovascular;  Laterality: N/A;  . CATARACT EXTRACTION    . CHOLECYSTECTOMY    . COLONOSCOPY     Multiple, polyps  . ESOPHAGEAL MANOMETRY N/A 06/14/2016   Procedure: ESOPHAGEAL MANOMETRY (EM);  Surgeon: Gatha Mayer, MD;  Location: [facility];  Service: Endoscopy;  Laterality: N/A;  . LEFT HEART CATHETERIZATION WITH CORONARY ANGIOGRAM N/A 07/17/2013   Procedure: LEFT HEART CATHETERIZATION WITH CORONARY ANGIOGRAM;  Surgeon: Clent Demark, MD;  Location: [facility];   Service: Cardiovascular;  Laterality: N/A;  . SIGMOIDOSCOPY  08/23/2011  . TUBAL LIGATION    . UPPER GASTROINTESTINAL ENDOSCOPY  08/23/2011   Normal    Family History  Problem Relation Age of Onset  . Stomach cancer Brother   . Colon cancer Paternal Aunt   . Lung cancer Brother        smoker  . Esophageal cancer Father   . Stroke Mother     New complaints: None today  Social history: Lives by herself. Has lots of children and grandchildren that are constantly on and out of her house daily.  Controlled substance contract: n/a    Review of Systems  Constitutional: Negative for activity change and appetite change.  HENT: Negative.   Eyes: Negative for pain.  Respiratory: Negative for shortness of breath.   Cardiovascular: Negative for chest pain, palpitations and leg swelling.  Gastrointestinal: Negative for abdominal pain.  Endocrine: Negative for polydipsia.  Genitourinary: Negative.   Skin: Negative for rash.  Neurological: Negative for dizziness, weakness and headaches.  Hematological: Does not bruise/bleed easily.  Psychiatric/Behavioral: Negative.   All other systems reviewed and are negative.      Objective:   Physical Exam Vitals signs and nursing note reviewed.  Constitutional:      General: She is not in acute distress.    Appearance: Normal appearance. She is well-developed.  HENT:     Head: Normocephalic.     Nose: Nose normal.  Eyes:     Pupils: Pupils are equal, round, and reactive to light.  Neck:     Musculoskeletal: Normal range of motion and neck supple.     Vascular: No carotid bruit or JVD.  Cardiovascular:     Rate and Rhythm: Normal rate and regular rhythm.     Heart sounds: Normal heart sounds.  Pulmonary:     Effort: Pulmonary effort is normal. No respiratory distress.     Breath sounds: Normal breath sounds. No wheezing or rales.  Chest:     Chest wall: No tenderness.  Abdominal:     General: Bowel sounds are normal. There is no  distension or abdominal bruit.     Palpations: Abdomen is soft. There is no hepatomegaly, splenomegaly, mass or pulsatile mass.     Tenderness: There is no abdominal tenderness.  Genitourinary:    Comments: Erythematous moist appearing annular area on anal cleft Musculoskeletal: Normal range of motion.  Lymphadenopathy:     Cervical: No cervical adenopathy.  Skin:    General: Skin is warm and dry.  Neurological:     Mental Status: She is alert and oriented to person, place, and time.     Deep Tendon Reflexes: Reflexes are normal and symmetric.  Psychiatric:  Behavior: Behavior normal.        Thought Content: Thought content normal.        Judgment: Judgment normal.     BP (!) 159/71   Pulse (!) 50   Temp (!) 97.2 F (36.2 C) (Oral)   Ht 5' 1" (1.549 m)   Wt 130 lb (59 kg)   BMI 24.56 kg/m         Assessment & Plan:  Erica [name] comes in today with chief complaint of Medical Management of Chronic Issues (Check place on bottom)   Diagnosis and orders addressed:  1. Essential hypertension Low sodium diet - CMP14+EGFR - CBC with Differential/Platelet - olmesartan (BENICAR) 40 MG tablet; Take 1 tablet (40 mg total) by mouth daily.  Dispense: 90 tablet; Refill: 1 - amLODipine (NORVASC) 5 MG tablet; Take 1 tablet (5 mg total) by mouth daily.  Dispense: 90 tablet; Refill: 1 - furosemide (LASIX) 20 MG tablet; Take 1 tablet (20 mg total) by mouth daily.  Dispense: 90 tablet; Refill: 1  2. Gastroesophageal reflux disease without esophagitis Avoid spicy foods Do not eat 2 hours prior to bedtime - esomeprazole (NEXIUM) 40 MG capsule; Take 1 capsule (40 mg total) by mouth 2 (two) times daily before a meal.  Dispense: 90 capsule; Refill: 1  3. Hypothyroidism, unspecified type - Thyroid Panel With TSH - levothyroxine (SYNTHROID) 88 MCG tablet; Take 1 tablet (88 mcg total) by mouth daily before breakfast.  Dispense: 90 tablet; Refill: 1  4. Mixed hyperlipidemia Low  fat diet - Lipid panel  5. Left foot drop Fall precautions  6. Anxiety about health Stress management  7. Recurrent major depressive disorder, in partial remission (HCC) - escitalopram (LEXAPRO) 20 MG tablet; Take 2 tablets (40 mg total) by mouth daily.  Dispense: 180 tablet; Refill: 1  8. BMI 27.0-27.9,adult Discussed diet and exercise for person with BMI >25 Will recheck weight in 3-6 months  9. Low vitamin D level - VITAMIN D 25 Hydroxy (Vit-D Deficiency, Fractures)  10. Cutaneous candidiasis Keep clean and dry - nystatin (MYCOSTATIN) 100000 UNIT/ML suspension; Take 5 mLs (500,000 Units total) by mouth 4 (four) times daily.  Dispense: 60 mL; Refill: 0   Labs pending Health Maintenance reviewed Diet and exercise encouraged  Follow up plan: 6 months   [place], [name]

## 2019-08-27 NOTE — Patient Instructions (Signed)

## 2019-08-28 LAB — CMP14+EGFR
ALT: 31 IU/L (ref 0–32)
AST: 30 IU/L (ref 0–40)
Albumin/Globulin Ratio: 1.9 (ref 1.2–2.2)
Albumin: 4 g/dL (ref 3.6–4.6)
Alkaline Phosphatase: 60 IU/L (ref 39–117)
BUN/Creatinine Ratio: 18 (ref 12–28)
BUN: 17 mg/dL (ref 8–27)
Bilirubin Total: 0.4 mg/dL (ref 0.0–1.2)
CO2: 23 mmol/L (ref 20–29)
Calcium: 9.3 mg/dL (ref 8.7–10.3)
Chloride: 102 mmol/L (ref 96–106)
Creatinine, Ser: 0.93 mg/dL (ref 0.57–1.00)
GFR calc Af Amer: 64 mL/min/{1.73_m2} (ref >59)
GFR calc non Af Amer: 56 mL/min/{1.73_m2} — ABNORMAL LOW (ref >59)
Globulin, Total: 2.1 g/dL (ref 1.5–4.5)
Glucose: 102 mg/dL — ABNORMAL HIGH (ref 65–99)
Potassium: 4.6 mmol/L (ref 3.5–5.2)
Sodium: 137 mmol/L (ref 134–144)
Total Protein: 6.1 g/dL (ref 6.0–8.5)

## 2019-08-28 LAB — CBC WITH DIFFERENTIAL/PLATELET
Basophils Absolute: 0.1 10*3/uL (ref 0.0–0.2)
Basos: 1 %
EOS (ABSOLUTE): 0.1 10*3/uL (ref 0.0–0.4)
Eos: 1 %
Hematocrit: 39.2 % (ref 34.0–46.6)
Hemoglobin: 13.3 g/dL (ref 11.1–15.9)
Immature Grans (Abs): 0 10*3/uL (ref 0.0–0.1)
Immature Granulocytes: 0 %
Lymphocytes Absolute: 2.9 10*3/uL (ref 0.7–3.1)
Lymphs: 35 %
MCH: 31.1 pg (ref 26.6–33.0)
MCHC: 33.9 g/dL (ref 31.5–35.7)
MCV: 92 fL (ref 79–97)
Monocytes Absolute: 0.7 10*3/uL (ref 0.1–0.9)
Monocytes: 9 %
Neutrophils Absolute: 4.5 10*3/uL (ref 1.4–7.0)
Neutrophils: 54 %
Platelets: 207 10*3/uL (ref 150–450)
RBC: 4.27 x10E6/uL (ref 3.77–5.28)
RDW: 13 % (ref 11.7–15.4)
WBC: 8.3 10*3/uL (ref 3.4–10.8)

## 2019-08-28 LAB — LIPID PANEL
Chol/HDL Ratio: 3.3 ratio (ref 0.0–4.4)
Cholesterol, Total: 196 mg/dL (ref 100–199)
HDL: 60 mg/dL (ref >39)
LDL Chol Calc (NIH): 119 mg/dL — ABNORMAL HIGH (ref 0–99)
Triglycerides: 95 mg/dL (ref 0–149)
VLDL Cholesterol Cal: 17 mg/dL (ref 5–40)

## 2019-08-28 LAB — THYROID PANEL WITH TSH
Free Thyroxine Index: 3.7 (ref 1.2–4.9)
T3 Uptake Ratio: 34 % (ref 24–39)
T4, Total: 10.8 ug/dL (ref 4.5–12.0)
TSH: 1.56 u[IU]/mL (ref 0.450–4.500)

## 2019-08-28 LAB — VITAMIN D 25 HYDROXY (VIT D DEFICIENCY, FRACTURES): Vit D, 25-Hydroxy: 45 ng/mL (ref 30.0–100.0)

## 2019-09-06 DIAGNOSIS — L900 Lichen sclerosus et atrophicus: Secondary | ICD-10-CM | POA: Diagnosis not present

## 2019-09-06 DIAGNOSIS — Z6824 Body mass index (BMI) 24.0-24.9, adult: Secondary | ICD-10-CM | POA: Diagnosis not present

## 2019-09-06 DIAGNOSIS — M810 Age-related osteoporosis without current pathological fracture: Secondary | ICD-10-CM | POA: Diagnosis not present

## 2019-09-06 DIAGNOSIS — L292 Pruritus vulvae: Secondary | ICD-10-CM | POA: Diagnosis not present

## 2019-09-06 DIAGNOSIS — Z01419 Encounter for gynecological examination (general) (routine) without abnormal findings: Secondary | ICD-10-CM | POA: Diagnosis not present

## 2019-09-06 DIAGNOSIS — R319 Hematuria, unspecified: Secondary | ICD-10-CM | POA: Diagnosis not present

## 2019-09-18 ENCOUNTER — Other Ambulatory Visit: Payer: Self-pay | Admitting: Nurse Practitioner

## 2019-09-18 DIAGNOSIS — I2510 Atherosclerotic heart disease of native coronary artery without angina pectoris: Secondary | ICD-10-CM

## 2019-09-19 ENCOUNTER — Encounter (HOSPITAL_COMMUNITY): Payer: Medicare Other

## 2019-09-25 ENCOUNTER — Encounter (HOSPITAL_COMMUNITY): Payer: Medicare Other

## 2019-09-27 DIAGNOSIS — H52223 Regular astigmatism, bilateral: Secondary | ICD-10-CM | POA: Diagnosis not present

## 2019-09-27 DIAGNOSIS — H35433 Paving stone degeneration of retina, bilateral: Secondary | ICD-10-CM | POA: Diagnosis not present

## 2019-09-27 DIAGNOSIS — H524 Presbyopia: Secondary | ICD-10-CM | POA: Diagnosis not present

## 2019-09-27 DIAGNOSIS — H26492 Other secondary cataract, left eye: Secondary | ICD-10-CM | POA: Diagnosis not present

## 2019-09-27 DIAGNOSIS — H5203 Hypermetropia, bilateral: Secondary | ICD-10-CM | POA: Diagnosis not present

## 2019-10-03 ENCOUNTER — Other Ambulatory Visit: Payer: Self-pay

## 2019-10-04 ENCOUNTER — Ambulatory Visit (INDEPENDENT_AMBULATORY_CARE_PROVIDER_SITE_OTHER): Payer: Medicare Other | Admitting: *Deleted

## 2019-10-04 DIAGNOSIS — Z23 Encounter for immunization: Secondary | ICD-10-CM | POA: Diagnosis not present

## 2019-10-10 DIAGNOSIS — R3121 Asymptomatic microscopic hematuria: Secondary | ICD-10-CM | POA: Diagnosis not present

## 2019-10-10 DIAGNOSIS — N3941 Urge incontinence: Secondary | ICD-10-CM | POA: Diagnosis not present

## 2019-10-22 ENCOUNTER — Telehealth: Payer: Self-pay | Admitting: Nurse Practitioner

## 2019-10-23 NOTE — Telephone Encounter (Signed)
Appointment scheduled 10/25/2019 at 2:30 pm with [name].

## 2019-10-25 ENCOUNTER — Ambulatory Visit: Payer: Medicare Other | Admitting: Nurse Practitioner

## 2019-11-08 DIAGNOSIS — I480 Paroxysmal atrial fibrillation: Secondary | ICD-10-CM | POA: Diagnosis not present

## 2019-11-08 DIAGNOSIS — E039 Hypothyroidism, unspecified: Secondary | ICD-10-CM | POA: Diagnosis not present

## 2019-11-08 DIAGNOSIS — I10 Essential (primary) hypertension: Secondary | ICD-10-CM | POA: Diagnosis not present

## 2019-11-08 DIAGNOSIS — E785 Hyperlipidemia, unspecified: Secondary | ICD-10-CM | POA: Diagnosis not present

## 2019-11-08 DIAGNOSIS — F419 Anxiety disorder, unspecified: Secondary | ICD-10-CM | POA: Diagnosis not present

## 2019-11-08 DIAGNOSIS — I2510 Atherosclerotic heart disease of native coronary artery without angina pectoris: Secondary | ICD-10-CM | POA: Diagnosis not present

## 2019-11-08 DIAGNOSIS — M1990 Unspecified osteoarthritis, unspecified site: Secondary | ICD-10-CM | POA: Diagnosis not present

## 2019-11-08 DIAGNOSIS — F341 Dysthymic disorder: Secondary | ICD-10-CM | POA: Diagnosis not present

## 2019-11-15 ENCOUNTER — Other Ambulatory Visit: Payer: Self-pay | Admitting: Nurse Practitioner

## 2019-11-15 ENCOUNTER — Telehealth: Payer: Self-pay | Admitting: Nurse Practitioner

## 2019-11-15 DIAGNOSIS — I2510 Atherosclerotic heart disease of native coronary artery without angina pectoris: Secondary | ICD-10-CM

## 2019-11-15 NOTE — Chronic Care Management (AMB) (Signed)
°  Chronic Care Management   Outreach Note  11/15/2019 Name: Erica [name] MRN: [id number] DOB: August 11, 1930  Referred by: [name], [name] Reason for referral : Chronic Care Management (Initial CCM outreach was unsuccessful.)   An unsuccessful telephone outreach was attempted today. The patient was referred to the case management team by for assistance with care management and care coordination.   Follow Up Plan: A HIPPA compliant phone message was left for the patient providing contact information and requesting a return call.  The care management team will reach out to the patient again over the next 7 days.  If patient returns call to provider office, please advise to call [place] at [place]  [place], [place] [address] Direct Dial: [email or web address]  Website: [email or web address]

## 2019-12-04 NOTE — Chronic Care Management (AMB) (Signed)
Chronic Care Management   Note  12/04/2019 Name: Erica [name] MRN: [id number] DOB: 12/26/1930  Erica [name] is a 85 y.o. year old female who is a primary care patient of [name], [place]. I reached out to [name] by phone today in response to a referral sent by [name]'s health plan.     [name] was given information about Chronic Care Management services today including:  1. CCM service includes personalized support from designated clinical staff supervised by her physician, including individualized plan of care and coordination with other care providers 2. 24/7 contact phone numbers for assistance for urgent and routine care needs. 3. Service will only be billed when office clinical staff spend 20 minutes or more in a month to coordinate care. 4. Only one practitioner may furnish and bill the service in a calendar month. 5. The patient may stop CCM services at any time (effective at the end of the month) by phone call to the office staff. 6. The patient will be responsible for cost sharing (co-pay) of up to 20% of the service fee (after annual deductible is met).  Patient did not agree to enrollment in care management services and does not wish to consider at this time.  Follow up plan: The patient has been provided with contact information for the chronic care management team and has been advised to call with any health related questions or concerns.   [place], [place] [address] Direct Dial: [email or web address]  Website: [email or web address]

## 2019-12-14 ENCOUNTER — Other Ambulatory Visit: Payer: Self-pay | Admitting: Nurse Practitioner

## 2019-12-14 DIAGNOSIS — I2510 Atherosclerotic heart disease of native coronary artery without angina pectoris: Secondary | ICD-10-CM

## 2020-02-04 ENCOUNTER — Other Ambulatory Visit: Payer: Self-pay | Admitting: Dermatology

## 2020-02-04 DIAGNOSIS — I480 Paroxysmal atrial fibrillation: Secondary | ICD-10-CM | POA: Diagnosis not present

## 2020-02-04 DIAGNOSIS — D485 Neoplasm of uncertain behavior of skin: Secondary | ICD-10-CM | POA: Diagnosis not present

## 2020-02-04 DIAGNOSIS — D229 Melanocytic nevi, unspecified: Secondary | ICD-10-CM | POA: Diagnosis not present

## 2020-02-04 DIAGNOSIS — E785 Hyperlipidemia, unspecified: Secondary | ICD-10-CM | POA: Diagnosis not present

## 2020-02-04 DIAGNOSIS — D3611 Benign neoplasm of peripheral nerves and autonomic nervous system of face, head, and neck: Secondary | ICD-10-CM | POA: Diagnosis not present

## 2020-02-04 DIAGNOSIS — I10 Essential (primary) hypertension: Secondary | ICD-10-CM | POA: Diagnosis not present

## 2020-02-04 DIAGNOSIS — R208 Other disturbances of skin sensation: Secondary | ICD-10-CM | POA: Diagnosis not present

## 2020-02-04 DIAGNOSIS — F341 Dysthymic disorder: Secondary | ICD-10-CM | POA: Diagnosis not present

## 2020-02-04 DIAGNOSIS — E039 Hypothyroidism, unspecified: Secondary | ICD-10-CM | POA: Diagnosis not present

## 2020-02-04 DIAGNOSIS — I2510 Atherosclerotic heart disease of native coronary artery without angina pectoris: Secondary | ICD-10-CM | POA: Diagnosis not present

## 2020-02-04 DIAGNOSIS — M1990 Unspecified osteoarthritis, unspecified site: Secondary | ICD-10-CM | POA: Diagnosis not present

## 2020-02-04 DIAGNOSIS — F419 Anxiety disorder, unspecified: Secondary | ICD-10-CM | POA: Diagnosis not present

## 2020-02-11 ENCOUNTER — Other Ambulatory Visit: Payer: Self-pay | Admitting: Nurse Practitioner

## 2020-02-11 DIAGNOSIS — Z23 Encounter for immunization: Secondary | ICD-10-CM | POA: Diagnosis not present

## 2020-02-11 DIAGNOSIS — I2510 Atherosclerotic heart disease of native coronary artery without angina pectoris: Secondary | ICD-10-CM

## 2020-02-22 ENCOUNTER — Other Ambulatory Visit: Payer: Self-pay

## 2020-02-25 ENCOUNTER — Encounter: Payer: Self-pay | Admitting: Nurse Practitioner

## 2020-02-25 ENCOUNTER — Ambulatory Visit (INDEPENDENT_AMBULATORY_CARE_PROVIDER_SITE_OTHER): Payer: Medicare Other | Admitting: Nurse Practitioner

## 2020-02-25 ENCOUNTER — Other Ambulatory Visit: Payer: Self-pay

## 2020-02-25 VITALS — BP 147/69 | HR 65 | Temp 96.8°F | Resp 20 | Ht 61.0 in | Wt 134.0 lb

## 2020-02-25 DIAGNOSIS — E039 Hypothyroidism, unspecified: Secondary | ICD-10-CM

## 2020-02-25 DIAGNOSIS — E782 Mixed hyperlipidemia: Secondary | ICD-10-CM

## 2020-02-25 DIAGNOSIS — I10 Essential (primary) hypertension: Secondary | ICD-10-CM

## 2020-02-25 DIAGNOSIS — F418 Other specified anxiety disorders: Secondary | ICD-10-CM

## 2020-02-25 DIAGNOSIS — R194 Change in bowel habit: Secondary | ICD-10-CM

## 2020-02-25 DIAGNOSIS — R1312 Dysphagia, oropharyngeal phase: Secondary | ICD-10-CM | POA: Diagnosis not present

## 2020-02-25 DIAGNOSIS — I2510 Atherosclerotic heart disease of native coronary artery without angina pectoris: Secondary | ICD-10-CM

## 2020-02-25 DIAGNOSIS — K219 Gastro-esophageal reflux disease without esophagitis: Secondary | ICD-10-CM | POA: Diagnosis not present

## 2020-02-25 DIAGNOSIS — Z6827 Body mass index (BMI) 27.0-27.9, adult: Secondary | ICD-10-CM | POA: Diagnosis not present

## 2020-02-25 DIAGNOSIS — F3341 Major depressive disorder, recurrent, in partial remission: Secondary | ICD-10-CM | POA: Diagnosis not present

## 2020-02-25 MED ORDER — FUROSEMIDE 20 MG PO TABS: 20.0000 mg | ORAL_TABLET | Freq: Every day | ORAL | 1 refills | Status: DC

## 2020-02-25 MED ORDER — AMIODARONE HCL 200 MG PO TABS: 200.0000 mg | ORAL_TABLET | Freq: Every day | ORAL | 1 refills | Status: DC

## 2020-02-25 MED ORDER — APIXABAN 2.5 MG PO TABS: ORAL_TABLET | ORAL | 5 refills | Status: DC

## 2020-02-25 MED ORDER — LEVOTHYROXINE SODIUM 88 MCG PO TABS: 88.0000 ug | ORAL_TABLET | Freq: Every day | ORAL | 1 refills | Status: DC

## 2020-02-25 MED ORDER — ESCITALOPRAM OXALATE 20 MG PO TABS: 40.0000 mg | ORAL_TABLET | Freq: Every day | ORAL | 1 refills | Status: DC

## 2020-02-25 MED ORDER — OLMESARTAN MEDOXOMIL 40 MG PO TABS: 40.0000 mg | ORAL_TABLET | Freq: Every day | ORAL | 1 refills | Status: DC

## 2020-02-25 MED ORDER — AMLODIPINE BESYLATE 5 MG PO TABS: 5.0000 mg | ORAL_TABLET | Freq: Every day | ORAL | 1 refills | Status: DC

## 2020-02-25 MED ORDER — ATORVASTATIN CALCIUM 10 MG PO TABS: 10.0000 mg | ORAL_TABLET | Freq: Every day | ORAL | 1 refills | Status: DC

## 2020-02-25 MED ORDER — ESOMEPRAZOLE MAGNESIUM 40 MG PO CPDR: 40.0000 mg | DELAYED_RELEASE_CAPSULE | Freq: Two times a day (BID) | ORAL | 1 refills | Status: DC

## 2020-02-25 NOTE — Progress Notes (Signed)
Subjective:    Patient ID: Erica [name], female    DOB: October 17, 1930, 86 y.o.   MRN: [id number]   Chief Complaint: Medical Management of Chronic Issues    HPI:  1. Essential hypertension No c/o chest pain, sob or headache. Does not check blood pressure at home. BP Readings from Last 3 Encounters:  02/25/20 (!) 147/69  08/27/19 (!) 159/71  05/14/19 (!) 152/70     2. Gastroesophageal reflux disease without esophagitis Is on nexium- has occasional symptoms.  3. Oropharyngeal dysphagia She does well if she chews her foods real well.  4. Hypothyroidism, unspecified type No problem that hs eis aware of  5. Mixed hyperlipidemia Does watch diet but does not do any exercise. Sh edoes stay active and walks her dog.  6. Recurrent major depressive disorder, in partial remission ([place]) Is on lexapro and is doing well. No side effects from medication. Depression screen [facility] 2/9 02/25/2020 08/27/2019 05/14/2019  Decreased Interest 0 0 0  Down, Depressed, Hopeless 0 0 0  PHQ - 2 Score 0 0 0  Altered sleeping - - -  Tired, decreased energy - - -  Change in appetite - - -  Feeling bad or failure about yourself  - - -  Trouble concentrating - - -  Moving slowly or fidgety/restless - - -  Suicidal thoughts - - -  PHQ-9 Score - - -  Difficult doing work/chores - - -  Some recent data might be hidden     7. Anxiety about health Has always been anxious. GAD 7 : Generalized Anxiety Score 02/25/2020  Nervous, Anxious, on Edge 1  Control/stop worrying 1  Worry too much - different things 1  Trouble relaxing 0  Restless 0  Easily annoyed or irritable 0  Afraid - awful might happen 0  Total GAD 7 Score 3      8. BMI 27.0-27.9,adult No recent weight chnages Wt Readings from Last 3 Encounters:  02/25/20 134 lb (60.8 kg)  08/27/19 130 lb (59 kg)  05/14/19 132 lb (59.9 kg)   BMI Readings from Last 3 Encounters:  02/25/20 25.32 kg/m  08/27/19 24.56 kg/m  05/14/19 24.94 kg/m        Outpatient Encounter Medications as of 02/25/2020  Medication Sig  . amiodarone (PACERONE) 200 MG tablet TAKE 1 TABLET DAILY  . amLODipine (NORVASC) 5 MG tablet Take 1 tablet (5 mg total) by mouth daily.  Marland Kitchen atorvastatin (LIPITOR) 10 MG tablet   . Cholecalciferol (VITAMIN D PO) Take 1 tablet by mouth daily.  Marland Kitchen ELIQUIS 2.5 MG TABS tablet TAKE (1) TABLET TWICE A DAY.  Marland Kitchen escitalopram (LEXAPRO) 20 MG tablet Take 2 tablets (40 mg total) by mouth daily.  Marland Kitchen esomeprazole (NEXIUM) 40 MG capsule Take 1 capsule (40 mg total) by mouth 2 (two) times daily before a meal.  . furosemide (LASIX) 20 MG tablet Take 1 tablet (20 mg total) by mouth daily.  . hydrocortisone (ANUSOL-HC) 2.5 % rectal cream Place 1 application rectally 2 (two) times daily.  Marland Kitchen levothyroxine (SYNTHROID) 88 MCG tablet Take 1 tablet (88 mcg total) by mouth daily before breakfast.  . Melatonin 5 MG CAPS Take 5 mg by mouth at bedtime as needed (sleep).  . Multiple Vitamin (MULTIVITAMIN WITH MINERALS) TABS tablet Take 1 tablet by mouth daily.  . mupirocin cream (BACTROBAN) 2 % Apply 1 application topically 2 (two) times daily.  Marland Kitchen nystatin (MYCOSTATIN) 100000 UNIT/ML suspension Take 5 mLs (500,000 Units total) by mouth 4 (  four) times daily.  Marland Kitchen olmesartan (BENICAR) 40 MG tablet Take 1 tablet (40 mg total) by mouth daily.  . ondansetron (ZOFRAN ODT) 4 MG disintegrating tablet Take 1 tablet (4 mg total) by mouth every 8 (eight) hours as needed for nausea.  Marland Kitchen OVER THE COUNTER MEDICATION IB gard as needed  . valACYclovir (VALTREX) 1000 MG tablet Take 1 tablet (1,000 mg total) by mouth 2 (two) times daily.  . Wheat Dextrin (BENEFIBER) POWD Take 1 Dose by mouth daily. Take daily      Past Surgical History:  Procedure Laterality Date  . CARDIAC CATHETERIZATION N/A 02/03/2016   Procedure: Coronary/Graft Angiography;  Surgeon: Charolette Forward, MD;  Location: [facility];  Service: Cardiovascular;  Laterality: N/A;  . CATARACT  EXTRACTION    . CHOLECYSTECTOMY    . COLONOSCOPY     Multiple, polyps  . ESOPHAGEAL MANOMETRY N/A 06/14/2016   Procedure: ESOPHAGEAL MANOMETRY (EM);  Surgeon: Gatha Mayer, MD;  Location: [facility];  Service: Endoscopy;  Laterality: N/A;  . LEFT HEART CATHETERIZATION WITH CORONARY ANGIOGRAM N/A 07/17/2013   Procedure: LEFT HEART CATHETERIZATION WITH CORONARY ANGIOGRAM;  Surgeon: Clent Demark, MD;  Location: [facility];  Service: Cardiovascular;  Laterality: N/A;  . SIGMOIDOSCOPY  08/23/2011  . TUBAL LIGATION    . UPPER GASTROINTESTINAL ENDOSCOPY  08/23/2011   Normal    Family History  Problem Relation Age of Onset  . Stomach cancer Brother   . Colon cancer Paternal Aunt   . Lung cancer Brother        smoker  . Esophageal cancer Father   . Stroke Mother     New complaints: Patient want sto have a rpeat colonoscopy. She says her bowel habits have changed and sh ewant sto be checked. Last colonoscopy report says no more needed unless necessary.  Social history: lives by herself and family checks on her daily  Controlled substance contract: n/a    Review of Systems  Constitutional: Negative for diaphoresis.  Eyes: Negative for pain.  Respiratory: Negative for shortness of breath.   Cardiovascular: Negative for chest pain, palpitations and leg swelling.  Gastrointestinal: Negative for abdominal pain.  Endocrine: Negative for polydipsia.  Skin: Negative for rash.  Neurological: Negative for dizziness, weakness and headaches.  Hematological: Does not bruise/bleed easily.  All other systems reviewed and are negative.      Objective:   Physical Exam Vitals and nursing note reviewed.  Constitutional:      General: She is not in acute distress.    Appearance: Normal appearance. She is well-developed.  HENT:     Head: Normocephalic.     Nose: Nose normal.  Eyes:     Pupils: Pupils are equal, round, and reactive to light.  Neck:     Vascular: No carotid bruit or  JVD.  Cardiovascular:     Rate and Rhythm: Normal rate and regular rhythm.     Heart sounds: Normal heart sounds.  Pulmonary:     Effort: Pulmonary effort is normal. No respiratory distress.     Breath sounds: Normal breath sounds. No wheezing or rales.  Chest:     Chest wall: No tenderness.  Abdominal:     General: Bowel sounds are normal. There is no distension or abdominal bruit.     Palpations: Abdomen is soft. There is no hepatomegaly, splenomegaly, mass or pulsatile mass.     Tenderness: There is no abdominal tenderness.  Musculoskeletal:        General:  Normal range of motion.     Cervical back: Normal range of motion and neck supple.  Lymphadenopathy:     Cervical: No cervical adenopathy.  Skin:    General: Skin is warm and dry.  Neurological:     Mental Status: She is alert and oriented to person, place, and time.     Deep Tendon Reflexes: Reflexes are normal and symmetric.  Psychiatric:        Behavior: Behavior normal.        Thought Content: Thought content normal.        Judgment: Judgment normal.    BP (!) 147/69   Pulse 65   Temp (!) 96.8 F (36 C) (Temporal)   Resp 20   Ht 5' 1"  (1.549 m)   Wt 134 lb (60.8 kg)   SpO2 95%   BMI 25.32 kg/m        Assessment & Plan:  Erica [name] comes in today with chief complaint of Medical Management of Chronic Issues   Diagnosis and orders addressed:  1. Essential hypertension Low sodium diet - amLODipine (NORVASC) 5 MG tablet; Take 1 tablet (5 mg total) by mouth daily.  Dispense: 90 tablet; Refill: 1 - olmesartan (BENICAR) 40 MG tablet; Take 1 tablet (40 mg total) by mouth daily.  Dispense: 90 tablet; Refill: 1 - furosemide (LASIX) 20 MG tablet; Take 1 tablet (20 mg total) by mouth daily.  Dispense: 90 tablet; Refill: 1 - CMP14+EGFR - CBC with Differential/Platelet  2. Gastroesophageal reflux disease without esophagitis Avoid spicy foods Do not eat 2 hours prior to bedtime - esomeprazole (NEXIUM) 40 MG  capsule; Take 1 capsule (40 mg total) by mouth 2 (two) times daily before a meal.  Dispense: 90 capsule; Refill: 1  3. Oropharyngeal dysphagia Chew food well  4. Hypothyroidism, unspecified type - levothyroxine (SYNTHROID) 88 MCG tablet; Take 1 tablet (88 mcg total) by mouth daily before breakfast.  Dispense: 90 tablet; Refill: 1 - Thyroid Panel With TSH  5. Mixed hyperlipidemia Low fat diet  6. Recurrent major depressive disorder, in partial remission ([place]) Stress management encouraged - escitalopram (LEXAPRO) 20 MG tablet; Take 2 tablets (40 mg total) by mouth daily.  Dispense: 180 tablet; Refill: 1  7. Anxiety about health  8. BMI 27.0-27.9,adult Discussed diet and exercise for person with BMI >25 Will recheck weight in 3-6 months  9. Bowel habit changes Referral back to GI - Ambulatory referral to Gastroenterology  10. Coronary artery disease involving native coronary artery of native heart without angina pectoris Keep follow up with crdiology - apixaban (ELIQUIS) 2.5 MG TABS tablet; TAKE (1) TABLET TWICE A DAY.  Dispense: 60 tablet; Refill: 5 - amiodarone (PACERONE) 200 MG tablet; Take 1 tablet (200 mg total) by mouth daily.  Dispense: 90 tablet; Refill: 1 - Lipid panel   Labs pending Health Maintenance reviewed Diet and exercise encouraged  Follow up plan: 6 months   [place], [name]

## 2020-02-25 NOTE — Patient Instructions (Signed)
Preventing Colorectal Cancer Colorectal cancer is an abnormal growth of cells and tissue (tumor) in the colon or rectum, which are parts of the large intestine. If colorectal cancer is not found or prevented early, it can spread (metastasize) to other parts of the body and can be fatal. You are more likely to develop this condition if:  You are older than age 84.  You have a family history of colorectal cancer or genetic conditions, such as: ? Lynch syndrome. ? Familial adenomatous polyposis. ? Turcot syndrome. ? Peutz-Jeghers syndrome.  You have had cancer before.  You have multiple growths (polyps) in the colon or rectum.  You have certain other conditions, such as an inflammatory bowel disease or Crohn's disease.  You are African American.  You have diabetes.  You have an inactive (sedentary) lifestyle.  You eat a diet that is high in red or processed meat and high in fat (especially animal fat).  You eat a diet that is low in sources of fiber, such as fruits, vegetables, and whole grains.  You drink alcohol excessively.  You smoke. It is important to have colorectal cancer tests (screenings) to check for early signs of cancer. During a screening, polyps may be removed and examined to check for cancer cells and possibly to prevent them from becoming cancerous. In addition to regular screenings, making changes to your diet and lifestyle can help prevent colorectal cancer. What nutrition changes can be made?   Eat plenty of fruits and vegetables. You need 1?2 cups of fruit each day. You also need 2?3 cups of vegetables each day.  Eat whole grains, which are grains that have not been processed. They include oats, whole wheat, bulgur, brown rice, quinoa, and millet. You should eat 6?8 oz (171-227 g) of grains each day. Use a kitchen scale to measure these amounts.  Eat less red meat. Instead, choose low-fat (lean) sources of protein such as beans, tofu, fish, and  chicken.  Avoid processed meat, such as deli meat, bacon, and sausage. Avoid frying and cooking meat at high heat. Use other methods of cooking, such as steaming or sauteing. What lifestyle changes can be made?   Do not use any products that contain nicotine or tobacco, such as cigarettes and e-cigarettes. If you need help quitting, ask your health care provider.  Limit alcohol intake to no more than 1 drink a day for nonpregnant women and 2 drinks a day for men. One drink equals 12 oz of beer, 5 oz of wine, or 1 oz of hard liquor.  If you are overweight or obese, work with your health care provider to lose weight.  Exercise. Aim for at least 150 minutes of moderate exercise (like walking, biking, or yoga) or 75 minutes of vigorous exercise (like running or swimming) each week. Ask your health care provider how much physical activity is best for you.  Have regular screenings as often as recommended by your health care provider. ? All adults should have screenings starting at age 82 and continuing until age 27. Your health care provider may recommend screening at age 48. You will have tests every 1-10 years, depending on your results and the type of screening test. People at increased risk should start screening at an earlier age. ? There are several types of screening tests. They include:  Guaiac-based fecal occult blood testing.  Fecal immunochemical test (FIT).  Stool DNA test.  Virtual colonoscopy.  Sigmoidoscopy. During this test, a flexible tube with a tiny camera (sigmoidoscope)  is used to examine your rectum and lower colon. The sigmoidoscope is inserted through your anus into your rectum and lower colon.  Colonoscopy. During this test, a long, thin, flexible tube with a tiny camera (colonoscope) is used to examine your entire colon and rectum.  Ask your health care provider if you should take baby aspirin to help prevent polyps.  Do not ignore symptoms. Schedule a visit with  your health care provider if you experience: ? Blood in your stool. ? Discomfort, pain, bloating, fullness, or cramps in your abdomen. ? A change in your bowel habits. ? Unexplained weight loss. ? Anemia. ? Nausea and vomiting. ? Constant fatigue.  Find out about your family's medical history. It is important to know whether colorectal cancer is in your family. If you feel that you have a strong family history, ask to meet with a Dietitian. Why are these changes important? Colorectal cancer is a leading cause of death from cancer. Making these changes can help you reduce your risk. Getting recommended screenings means that signs of cancer can be found and treated early. What can happen if changes are not made? If you do not make changes, you may have an increased risk of developing colorectal cancer. If you delay recommended screenings, it is possible that any existing cancer will grow and spread before it is found. Cancer that is larger or has spread is more difficult to treat and cure. Where to find support To get support for preventing colorectal cancer, consider:  Talking with your health care provider. Ask about screenings and support groups. Where to find more information Learn more about colorectal cancer from:  The [place]: www.cancer.org  The [employer]: www.cancer.gov Summary  You can reduce your chances of getting colorectal cancer by getting recommended screenings and making certain nutritional and lifestyle changes.  Eat plenty of fruits, vegetables, and whole grains. Avoid red meats and processed meats.  Do not use any products that contain nicotine or tobacco, such as cigarettes and e-cigarettes. If you need help quitting, ask your health care provider.  Aim for at least 150 minutes of moderate exercise or 75 minutes of vigorous exercise each week. Ask your health care provider how much activity is best for you.  Get regular  screenings as often as recommended by your health care provider. Most people should have a colonoscopy at age 54. This information is not intended to replace advice given to you by your health care provider. Make sure you discuss any questions you have with your health care provider. Document Revised: 05/12/2018 Document Reviewed: 11/26/2016 Elsevier Patient Education  2020 [employer].

## 2020-02-26 LAB — CBC WITH DIFFERENTIAL/PLATELET
Basophils Absolute: 0.1 10*3/uL (ref 0.0–0.2)
Basos: 1 %
EOS (ABSOLUTE): 0.1 10*3/uL (ref 0.0–0.4)
Eos: 2 %
Hematocrit: 38.4 % (ref 34.0–46.6)
Hemoglobin: 13.2 g/dL (ref 11.1–15.9)
Immature Grans (Abs): 0 10*3/uL (ref 0.0–0.1)
Immature Granulocytes: 0 %
Lymphocytes Absolute: 3.2 10*3/uL — ABNORMAL HIGH (ref 0.7–3.1)
Lymphs: 37 %
MCH: 31.8 pg (ref 26.6–33.0)
MCHC: 34.4 g/dL (ref 31.5–35.7)
MCV: 93 fL (ref 79–97)
Monocytes Absolute: 0.8 10*3/uL (ref 0.1–0.9)
Monocytes: 10 %
Neutrophils Absolute: 4.3 10*3/uL (ref 1.4–7.0)
Neutrophils: 50 %
Platelets: 207 10*3/uL (ref 150–450)
RBC: 4.15 x10E6/uL (ref 3.77–5.28)
RDW: 12.8 % (ref 11.7–15.4)
WBC: 8.6 10*3/uL (ref 3.4–10.8)

## 2020-02-26 LAB — CMP14+EGFR
ALT: 34 IU/L — ABNORMAL HIGH (ref 0–32)
AST: 34 IU/L (ref 0–40)
Albumin/Globulin Ratio: 1.5 (ref 1.2–2.2)
Albumin: 3.7 g/dL (ref 3.6–4.6)
Alkaline Phosphatase: 75 IU/L (ref 39–117)
BUN/Creatinine Ratio: 17 (ref 12–28)
BUN: 13 mg/dL (ref 8–27)
Bilirubin Total: 0.2 mg/dL (ref 0.0–1.2)
CO2: 26 mmol/L (ref 20–29)
Calcium: 9.2 mg/dL (ref 8.7–10.3)
Chloride: 103 mmol/L (ref 96–106)
Creatinine, Ser: 0.76 mg/dL (ref 0.57–1.00)
GFR calc Af Amer: 82 mL/min/{1.73_m2} (ref >59)
GFR calc non Af Amer: 71 mL/min/{1.73_m2} (ref >59)
Globulin, Total: 2.5 g/dL (ref 1.5–4.5)
Glucose: 91 mg/dL (ref 65–99)
Potassium: 5.8 mmol/L — ABNORMAL HIGH (ref 3.5–5.2)
Sodium: 139 mmol/L (ref 134–144)
Total Protein: 6.2 g/dL (ref 6.0–8.5)

## 2020-02-26 LAB — THYROID PANEL WITH TSH
Free Thyroxine Index: 3.9 (ref 1.2–4.9)
T3 Uptake Ratio: 35 % (ref 24–39)
T4, Total: 11 ug/dL (ref 4.5–12.0)
TSH: 1.38 u[IU]/mL (ref 0.450–4.500)

## 2020-02-26 LAB — LIPID PANEL
Chol/HDL Ratio: 2.8 ratio (ref 0.0–4.4)
Cholesterol, Total: 169 mg/dL (ref 100–199)
HDL: 60 mg/dL (ref >39)
LDL Chol Calc (NIH): 90 mg/dL (ref 0–99)
Triglycerides: 108 mg/dL (ref 0–149)
VLDL Cholesterol Cal: 19 mg/dL (ref 5–40)

## 2020-02-29 ENCOUNTER — Other Ambulatory Visit: Payer: Medicare Other

## 2020-02-29 ENCOUNTER — Other Ambulatory Visit: Payer: Self-pay

## 2020-02-29 DIAGNOSIS — E875 Hyperkalemia: Secondary | ICD-10-CM | POA: Diagnosis not present

## 2020-03-01 LAB — BMP8+EGFR
BUN/Creatinine Ratio: 16 (ref 12–28)
BUN: 16 mg/dL (ref 8–27)
CO2: 23 mmol/L (ref 20–29)
Calcium: 9.3 mg/dL (ref 8.7–10.3)
Chloride: 99 mmol/L (ref 96–106)
Creatinine, Ser: 1.02 mg/dL — ABNORMAL HIGH (ref 0.57–1.00)
GFR calc Af Amer: 57 mL/min/{1.73_m2} — ABNORMAL LOW (ref >59)
GFR calc non Af Amer: 50 mL/min/{1.73_m2} — ABNORMAL LOW (ref >59)
Glucose: 78 mg/dL (ref 65–99)
Potassium: 4.2 mmol/L (ref 3.5–5.2)
Sodium: 136 mmol/L (ref 134–144)

## 2020-03-05 ENCOUNTER — Telehealth: Payer: Self-pay | Admitting: Nurse Practitioner

## 2020-03-05 NOTE — Telephone Encounter (Signed)
PATIENT AWARE POTASSIUM IS BACK TO NORMAL

## 2020-03-07 DIAGNOSIS — R079 Chest pain, unspecified: Secondary | ICD-10-CM | POA: Diagnosis not present

## 2020-03-10 ENCOUNTER — Other Ambulatory Visit: Payer: Self-pay | Admitting: Nurse Practitioner

## 2020-03-10 DIAGNOSIS — Z23 Encounter for immunization: Secondary | ICD-10-CM | POA: Diagnosis not present

## 2020-03-10 MED ORDER — MUPIROCIN CALCIUM 2 % EX CREA: 1.0000 "application " | TOPICAL_CREAM | Freq: Two times a day (BID) | CUTANEOUS | 0 refills | Status: DC

## 2020-03-21 ENCOUNTER — Telehealth: Payer: Self-pay | Admitting: Nurse Practitioner

## 2020-03-21 MED ORDER — MUPIROCIN CALCIUM 2 % EX CREA: 1.0000 "application " | TOPICAL_CREAM | Freq: Two times a day (BID) | CUTANEOUS | 0 refills | Status: DC

## 2020-03-21 NOTE — Telephone Encounter (Signed)
Resent cream it was set on print

## 2020-04-01 DIAGNOSIS — Z1231 Encounter for screening mammogram for malignant neoplasm of breast: Secondary | ICD-10-CM | POA: Diagnosis not present

## 2020-04-01 DIAGNOSIS — Z6824 Body mass index (BMI) 24.0-24.9, adult: Secondary | ICD-10-CM | POA: Diagnosis not present

## 2020-04-01 DIAGNOSIS — Z124 Encounter for screening for malignant neoplasm of cervix: Secondary | ICD-10-CM | POA: Diagnosis not present

## 2020-04-08 ENCOUNTER — Ambulatory Visit (INDEPENDENT_AMBULATORY_CARE_PROVIDER_SITE_OTHER): Payer: Medicare Other | Admitting: Internal Medicine

## 2020-04-08 ENCOUNTER — Encounter: Payer: Self-pay | Admitting: Internal Medicine

## 2020-04-08 VITALS — BP 136/62 | HR 60 | Temp 98.2°F | Ht 61.0 in | Wt 133.0 lb

## 2020-04-08 DIAGNOSIS — Z7901 Long term (current) use of anticoagulants: Secondary | ICD-10-CM | POA: Diagnosis not present

## 2020-04-08 DIAGNOSIS — I2510 Atherosclerotic heart disease of native coronary artery without angina pectoris: Secondary | ICD-10-CM

## 2020-04-08 DIAGNOSIS — K648 Other hemorrhoids: Secondary | ICD-10-CM | POA: Diagnosis not present

## 2020-04-08 DIAGNOSIS — K5730 Diverticulosis of large intestine without perforation or abscess without bleeding: Secondary | ICD-10-CM | POA: Diagnosis not present

## 2020-04-08 DIAGNOSIS — L309 Dermatitis, unspecified: Secondary | ICD-10-CM

## 2020-04-08 MED ORDER — CLOTRIMAZOLE-BETAMETHASONE 1-0.05 % EX CREA: 1.0000 "application " | TOPICAL_CREAM | Freq: Two times a day (BID) | CUTANEOUS | 0 refills | Status: DC

## 2020-04-08 MED ORDER — HYDROCORTISONE (PERIANAL) 2.5 % EX CREA: 1.0000 "application " | TOPICAL_CREAM | Freq: Two times a day (BID) | CUTANEOUS | 1 refills | Status: AC

## 2020-04-08 NOTE — Patient Instructions (Signed)
We have sent the following medications to your pharmacy for you to pick up at your convenience:  Lotrisone cream apply twice a day   Hydrocortisone ointment twice a day .  Thank you for trusting me with your gastrointestinal care!    Silvano Rusk, MD

## 2020-04-08 NOTE — Progress Notes (Signed)
Erica [name] 86 y.o. 1930-03-16 [id number]  Assessment & Plan:   Encounter Diagnoses  Name Primary?  . Bleeding internal hemorrhoids Yes  . Sigmoid diverticulosis   . Long term current use of anticoagulant - Eliquis   . Perianal dermatitis     She has had these problems before and I think she has symptomatic diverticulosis and bleeding hemorrhoids.  She is interested in a colonoscopy though I do not think there is a clear-cut need for it at this point.  She will treat her hemorrhoids with hydrocortisone cream and the perianal dermatitis with Lotrisone and I will see her in about a month.  We will reassess the need for a sigmoidoscopy versus colonoscopy at that point.  Consider rechecking hemoglobin then.  The blood is only with wiping its not in the stool and her bowel movements do have a normal caliber at times.  I appreciate the opportunity to care for this patient.  CC: [name], [name]    Subjective:   Chief Complaint: Thin stools and rectal bleeding  HPI Erica [name] presents with several weeks or more of thin stools most of the time but not all the time, she says she does better when she uses her MiraLAX which she does most days.  She is also seeing bright red blood with wiping.  She takes Eliquis.  She is concerned that she could have cancer.  Her last colonoscopy in 2015 demonstrated severe sigmoid diverticulosis, and 3 small subcentimeter adenomas.  I left her on as needed colonoscopy protocol given her age and those findings.  Hemoglobin was normal at primary care visit early March.  She has been vaccinated for Covid with the Moderna vaccine.  Still has regurgitation and reflux symptoms with her history of gastroparesis but is not vomiting. Allergies  Allergen Reactions  . Contrast Media [Iodinated Diagnostic Agents] Shortness Of Breath, Nausea Only and Swelling  . Iodine Shortness Of Breath, Nausea Only and Swelling  . Iohexol Hives and Swelling     Desc:  hives, swelling over 5 yrs ago-pt has never been pre-medicated--was told to not have iv contrast ever   . Ioxaglate Nausea Only, Shortness Of Breath and Swelling  . Metoclopramide Other (See Comments)    Mouth tremors, insomnia, and irritability   Current Meds  Medication Sig  . amiodarone (PACERONE) 200 MG tablet Take 1 tablet (200 mg total) by mouth daily.  Marland Kitchen amLODipine (NORVASC) 5 MG tablet Take 1 tablet (5 mg total) by mouth daily.  Marland Kitchen apixaban (ELIQUIS) 2.5 MG TABS tablet TAKE (1) TABLET TWICE A DAY.  Marland Kitchen atorvastatin (LIPITOR) 10 MG tablet Take 1 tablet (10 mg total) by mouth daily at 6 PM.  . Cholecalciferol (VITAMIN D PO) Take 1 tablet by mouth daily.  Marland Kitchen escitalopram (LEXAPRO) 20 MG tablet Take 2 tablets (40 mg total) by mouth daily.  Marland Kitchen esomeprazole (NEXIUM) 40 MG capsule Take 1 capsule (40 mg total) by mouth 2 (two) times daily before a meal.  . furosemide (LASIX) 20 MG tablet Take 1 tablet (20 mg total) by mouth daily.  . hydrocortisone (ANUSOL-HC) 2.5 % rectal cream Place 1 application rectally 2 (two) times daily.  Marland Kitchen levothyroxine (SYNTHROID) 88 MCG tablet Take 1 tablet (88 mcg total) by mouth daily before breakfast.  . Melatonin 5 MG CAPS Take 5 mg by mouth at bedtime as needed (sleep).  . Multiple Vitamin (MULTIVITAMIN WITH MINERALS) TABS tablet Take 1 tablet by mouth daily.  . mupirocin cream (BACTROBAN) 2 % Apply  1 application topically 2 (two) times daily.  Marland Kitchen nystatin (MYCOSTATIN) 100000 UNIT/ML suspension Take 5 mLs (500,000 Units total) by mouth 4 (four) times daily.  Marland Kitchen olmesartan (BENICAR) 40 MG tablet Take 1 tablet (40 mg total) by mouth daily.  . ondansetron (ZOFRAN ODT) 4 MG disintegrating tablet Take 1 tablet (4 mg total) by mouth every 8 (eight) hours as needed for nausea.  Marland Kitchen OVER THE COUNTER MEDICATION IB gard as needed  . valACYclovir (VALTREX) 1000 MG tablet Take 1 tablet (1,000 mg total) by mouth 2 (two) times daily.  . Wheat Dextrin (BENEFIBER) POWD Take 1 Dose by  mouth daily. Take daily    Past Medical History:  Diagnosis Date  . Abnormal transaminases 05/09/2019  . Abnormality of gait 08/25/2016  . Adenomatous rectal polyp   . Anxiety about health 05/09/2019  . Arthritis 04/03/2018   L hip  . Atrial fibrillation ([place])   . Bleeding internal hemorrhoids   . Cataract   . Chronic back pain   . Depression   . Diverticulosis of colon (without mention of hemorrhage)   . Gastroparesis   . GERD (gastroesophageal reflux disease)   . Hiatal hernia 09/1999   3cm  . Hyperlipidemia   . Hypertension   . Hypothyroid   . Ischemic colitis ([place])   . Left foot drop 08/25/2016  . Lichen sclerosus et atrophicus of the vulva   . MVP (mitral valve prolapse)   . Osteoporosis   . Sleep apnea   . TIA (transient ischemic attack)   . Weakness    Past Surgical History:  Procedure Laterality Date  . CARDIAC CATHETERIZATION N/A 02/03/2016   Procedure: Coronary/Graft Angiography;  Surgeon: Charolette Forward, MD;  Location: [facility];  Service: Cardiovascular;  Laterality: N/A;  . CATARACT EXTRACTION    . CHOLECYSTECTOMY    . COLONOSCOPY     Multiple, polyps  . ESOPHAGEAL MANOMETRY N/A 06/14/2016   Procedure: ESOPHAGEAL MANOMETRY (EM);  Surgeon: Gatha Mayer, MD;  Location: [facility];  Service: Endoscopy;  Laterality: N/A;  . LEFT HEART CATHETERIZATION WITH CORONARY ANGIOGRAM N/A 07/17/2013   Procedure: LEFT HEART CATHETERIZATION WITH CORONARY ANGIOGRAM;  Surgeon: Clent Demark, MD;  Location: [facility];  Service: Cardiovascular;  Laterality: N/A;  . SIGMOIDOSCOPY  08/23/2011  . TUBAL LIGATION    . UPPER GASTROINTESTINAL ENDOSCOPY  08/23/2011   Normal   Social History   Social History Narrative   Lives at home alone.   Right-handed.   2 cups caffeine most days.   family history includes Colon cancer in her paternal aunt; Esophageal cancer in her father; Lung cancer in her brother; Stomach cancer in her brother; Stroke in her mother.   Review of  Systems As above  Objective:   Physical Exam BP 136/62   Pulse 60   Temp 98.2 F (36.8 C)   Ht 5\' 1"  (1.549 m)   Wt 133 lb (60.3 kg)   BMI 25.13 kg/m  Well-developed elderly white woman in no acute distress Abdominal exam shows a soft nontender abdomen without masses or hepatosplenomegaly  Rectal exam with [name], CMA present shows some perianal dermatitis especially up over the coccygeal area and above.  Mild erythematous scaly.  Lightly erythematous more pink.  There is some decreased anal sphincter tone there is no mass there is formed to soft stool in the rectal vault no significant rectocele.  Nontender no sign of a fissure.  No mass.  Anoscopic exam is done which  shows grade 2 mildly inflamed violaceous hemorrhoids in the anal canal consistent with internal hemorrhoids in all positions.

## 2020-05-01 ENCOUNTER — Telehealth: Payer: Self-pay | Admitting: Nurse Practitioner

## 2020-05-01 ENCOUNTER — Other Ambulatory Visit: Payer: Self-pay

## 2020-05-01 ENCOUNTER — Other Ambulatory Visit: Payer: Self-pay | Admitting: *Deleted

## 2020-05-01 ENCOUNTER — Ambulatory Visit (INDEPENDENT_AMBULATORY_CARE_PROVIDER_SITE_OTHER): Payer: Medicare Other | Admitting: Family Medicine

## 2020-05-01 DIAGNOSIS — R109 Unspecified abdominal pain: Secondary | ICD-10-CM

## 2020-05-01 DIAGNOSIS — N3000 Acute cystitis without hematuria: Secondary | ICD-10-CM | POA: Diagnosis not present

## 2020-05-01 DIAGNOSIS — I2510 Atherosclerotic heart disease of native coronary artery without angina pectoris: Secondary | ICD-10-CM

## 2020-05-01 LAB — URINALYSIS, COMPLETE
Bilirubin, UA: NEGATIVE
Glucose, UA: NEGATIVE
Ketones, UA: NEGATIVE
Nitrite, UA: NEGATIVE
Protein,UA: NEGATIVE
Specific Gravity, UA: 1.015 (ref 1.005–1.030)
Urobilinogen, Ur: 2 mg/dL — ABNORMAL HIGH (ref 0.2–1.0)
pH, UA: 7 (ref 5.0–7.5)

## 2020-05-01 LAB — MICROSCOPIC EXAMINATION
Bacteria, UA: NONE SEEN
Epithelial Cells (non renal): >10 /hpf — AB (ref 0–10)
RBC, Urine: >30 /hpf — AB (ref 0–2)

## 2020-05-01 MED ORDER — CEPHALEXIN 500 MG PO CAPS: 500.0000 mg | ORAL_CAPSULE | Freq: Four times a day (QID) | ORAL | 0 refills | Status: DC

## 2020-05-01 NOTE — Progress Notes (Signed)
Virtual Visit via telephone Note  I connected with Erica [name] on 05/01/20 at 1337 by telephone and verified that I am speaking with the correct person using two identifiers. Erica [name] is currently located at home and no other people are currently with her during visit. The provider, Fransisca Kaufmann Samaiya Awadallah, MD is located in their office at time of visit.  Call ended at 1343  I discussed the limitations, risks, security and privacy concerns of performing an evaluation and management service by telephone and the availability of in person appointments. I also discussed with the patient that there may be a patient responsible charge related to this service. The patient expressed understanding and agreed to proceed.   History and Present Illness: Patient is calling in for bladder pressure and urgency that has been going on for the 2-3 days. She denies any fevers or chills. She denies flank pain.  She denies dysuria but does have urgency.  She denies any new blood in urine.   No diagnosis found.  Outpatient Encounter Medications as of 05/01/2020  Medication Sig  . amiodarone (PACERONE) 200 MG tablet Take 1 tablet (200 mg total) by mouth daily.  Marland Kitchen amLODipine (NORVASC) 5 MG tablet Take 1 tablet (5 mg total) by mouth daily.  Marland Kitchen apixaban (ELIQUIS) 2.5 MG TABS tablet TAKE (1) TABLET TWICE A DAY.  Marland Kitchen atorvastatin (LIPITOR) 10 MG tablet Take 1 tablet (10 mg total) by mouth daily at 6 PM.  . Cholecalciferol (VITAMIN D PO) Take 1 tablet by mouth daily.  . clotrimazole-betamethasone (LOTRISONE) cream Apply 1 application topically 2 (two) times daily. To skin at anus and tailbone area  . escitalopram (LEXAPRO) 20 MG tablet Take 2 tablets (40 mg total) by mouth daily.  Marland Kitchen esomeprazole (NEXIUM) 40 MG capsule Take 1 capsule (40 mg total) by mouth 2 (two) times daily before a meal.  . furosemide (LASIX) 20 MG tablet Take 1 tablet (20 mg total) by mouth daily.  . hydrocortisone (ANUSOL-HC) 2.5 % rectal cream  Place 1 application rectally 2 (two) times daily.  Marland Kitchen levothyroxine (SYNTHROID) 88 MCG tablet Take 1 tablet (88 mcg total) by mouth daily before breakfast.  . Melatonin 5 MG CAPS Take 5 mg by mouth at bedtime as needed (sleep).  . Multiple Vitamin (MULTIVITAMIN WITH MINERALS) TABS tablet Take 1 tablet by mouth daily.  . mupirocin cream (BACTROBAN) 2 % Apply 1 application topically 2 (two) times daily.  Marland Kitchen nystatin (MYCOSTATIN) 100000 UNIT/ML suspension Take 5 mLs (500,000 Units total) by mouth 4 (four) times daily.  Marland Kitchen olmesartan (BENICAR) 40 MG tablet Take 1 tablet (40 mg total) by mouth daily.  . ondansetron (ZOFRAN ODT) 4 MG disintegrating tablet Take 1 tablet (4 mg total) by mouth every 8 (eight) hours as needed for nausea.  Marland Kitchen OVER THE COUNTER MEDICATION IB gard as needed  . valACYclovir (VALTREX) 1000 MG tablet Take 1 tablet (1,000 mg total) by mouth 2 (two) times daily.  . Wheat Dextrin (BENEFIBER) POWD Take 1 Dose by mouth daily. Take daily    No facility-administered encounter medications on file as of 05/01/2020.    Review of Systems  Constitutional: Negative for chills and fever.  Eyes: Negative for visual disturbance.  Respiratory: Negative for chest tightness and shortness of breath.   Cardiovascular: Negative for chest pain and leg swelling.  Gastrointestinal: Negative for abdominal pain.  Genitourinary: Positive for frequency and urgency. Negative for difficulty urinating, dysuria, flank pain, hematuria, vaginal bleeding, vaginal discharge and vaginal  pain.  Musculoskeletal: Negative for back pain and gait problem.  Skin: Negative for rash.  Neurological: Negative for light-headedness and headaches.  Psychiatric/Behavioral: Negative for agitation and behavioral problems.  All other systems reviewed and are negative.   Observations/Objective: Patient sounds comfortable and in no acute distress  Assessment and Plan: Problem List Items Addressed This Visit    None    Visit  Diagnoses    Acute cystitis without hematuria    -  Primary   Relevant Orders   Urinalysis, Complete   Urine Culture      Patient is going to come in and leave a urine and then will treat based off those urine results, we will watch for the urine test.  Urinalysis 6-10 WBCs, greater than 30 RBCs, greater than 10 epithelial cells, trace leukocytes, 3+ blood Follow up plan: Return if symptoms worsen or fail to improve.     I discussed the assessment and treatment plan with the patient. The patient was provided an opportunity to ask questions and all were answered. The patient agreed with the plan and demonstrated an understanding of the instructions.   The patient was advised to call back or seek an in-person evaluation if the symptoms worsen or if the condition fails to improve as anticipated.  The above assessment and management plan was discussed with the patient. The patient verbalized understanding of and has agreed to the management plan. Patient is aware to call the clinic if symptoms persist or worsen. Patient is aware when to return to the clinic for a follow-up visit. Patient educated on when it is appropriate to go to the emergency department.    I provided 6 minutes of non-face-to-face time during this encounter.    Worthy Rancher, MD

## 2020-05-01 NOTE — Telephone Encounter (Signed)
Aware.  May come in to office to leave urine specimen.  Has a telephone visit with Dr. Warrick Parisian at 6:15 today.

## 2020-05-02 LAB — URINE CULTURE: Organism ID, Bacteria: NO GROWTH

## 2020-05-02 NOTE — Telephone Encounter (Signed)
Pt wants urine results. Will antibiotic be called in before weekend? Please call back

## 2020-05-02 NOTE — Telephone Encounter (Signed)
Pt is worried if she needs a medication that her pharmacy closes at [facility] and they may not be open on the weekend.  Please call ASAP.

## 2020-05-06 NOTE — Telephone Encounter (Signed)
Already sent an antibiotic and did phone message 4 days ago

## 2020-05-12 NOTE — Telephone Encounter (Signed)
Spoke with patient and advised that with just finishing an antibiotic that she should wait a few more days before her urine is rechecked. Advised I would put a future order in and to come by the office Wednesday or Thursday. Patient verbalized understanding

## 2020-05-14 ENCOUNTER — Other Ambulatory Visit: Payer: Self-pay

## 2020-05-14 ENCOUNTER — Other Ambulatory Visit: Payer: Medicare Other

## 2020-05-14 DIAGNOSIS — I2510 Atherosclerotic heart disease of native coronary artery without angina pectoris: Secondary | ICD-10-CM | POA: Diagnosis not present

## 2020-05-14 DIAGNOSIS — F341 Dysthymic disorder: Secondary | ICD-10-CM | POA: Diagnosis not present

## 2020-05-14 DIAGNOSIS — M1990 Unspecified osteoarthritis, unspecified site: Secondary | ICD-10-CM | POA: Diagnosis not present

## 2020-05-14 DIAGNOSIS — R109 Unspecified abdominal pain: Secondary | ICD-10-CM | POA: Diagnosis not present

## 2020-05-14 DIAGNOSIS — I480 Paroxysmal atrial fibrillation: Secondary | ICD-10-CM | POA: Diagnosis not present

## 2020-05-14 DIAGNOSIS — E785 Hyperlipidemia, unspecified: Secondary | ICD-10-CM | POA: Diagnosis not present

## 2020-05-14 DIAGNOSIS — I10 Essential (primary) hypertension: Secondary | ICD-10-CM | POA: Diagnosis not present

## 2020-05-14 DIAGNOSIS — E039 Hypothyroidism, unspecified: Secondary | ICD-10-CM | POA: Diagnosis not present

## 2020-05-14 DIAGNOSIS — F419 Anxiety disorder, unspecified: Secondary | ICD-10-CM | POA: Diagnosis not present

## 2020-05-14 LAB — URINALYSIS, COMPLETE
Bilirubin, UA: NEGATIVE
Glucose, UA: NEGATIVE
Ketones, UA: NEGATIVE
Nitrite, UA: NEGATIVE
Protein,UA: NEGATIVE
Specific Gravity, UA: 1.015 (ref 1.005–1.030)
Urobilinogen, Ur: 0.2 mg/dL (ref 0.2–1.0)
pH, UA: 7 (ref 5.0–7.5)

## 2020-05-14 LAB — MICROSCOPIC EXAMINATION
Bacteria, UA: NONE SEEN
Epithelial Cells (non renal): >10 /hpf — AB (ref 0–10)
Renal Epithel, UA: NONE SEEN /hpf

## 2020-05-15 ENCOUNTER — Encounter: Payer: Self-pay | Admitting: Internal Medicine

## 2020-05-15 ENCOUNTER — Ambulatory Visit (INDEPENDENT_AMBULATORY_CARE_PROVIDER_SITE_OTHER): Payer: Medicare Other | Admitting: Internal Medicine

## 2020-05-15 VITALS — BP 150/60 | HR 60 | Ht 60.5 in | Wt 128.2 lb

## 2020-05-15 DIAGNOSIS — F418 Other specified anxiety disorders: Secondary | ICD-10-CM

## 2020-05-15 DIAGNOSIS — L309 Dermatitis, unspecified: Secondary | ICD-10-CM | POA: Diagnosis not present

## 2020-05-15 DIAGNOSIS — K3184 Gastroparesis: Secondary | ICD-10-CM | POA: Diagnosis not present

## 2020-05-15 DIAGNOSIS — K224 Dyskinesia of esophagus: Secondary | ICD-10-CM

## 2020-05-15 DIAGNOSIS — R630 Anorexia: Secondary | ICD-10-CM | POA: Diagnosis not present

## 2020-05-15 DIAGNOSIS — R634 Abnormal weight loss: Secondary | ICD-10-CM | POA: Diagnosis not present

## 2020-05-15 DIAGNOSIS — R4589 Other symptoms and signs involving emotional state: Secondary | ICD-10-CM

## 2020-05-15 DIAGNOSIS — K648 Other hemorrhoids: Secondary | ICD-10-CM

## 2020-05-15 DIAGNOSIS — I2510 Atherosclerotic heart disease of native coronary artery without angina pectoris: Secondary | ICD-10-CM

## 2020-05-15 MED ORDER — CLOTRIMAZOLE-BETAMETHASONE 1-0.05 % EX CREA: 1.0000 "application " | TOPICAL_CREAM | Freq: Two times a day (BID) | CUTANEOUS | 2 refills | Status: DC

## 2020-05-15 NOTE — Patient Instructions (Signed)
We have sent the following medications to your pharmacy for you to pick up at your convenience: Lotrisone  Please make an appointment and go see [name], [name] to see if she thinks you need any changes in your depression medicine.   I appreciate the opportunity to care for you. Silvano Rusk, MD, [facility]

## 2020-05-15 NOTE — Progress Notes (Signed)
Erica [name] 86 y.o. Dec 17, 1930 [id number]  Assessment & Plan:   Encounter Diagnoses  Name Primary?  Marland Kitchen Anorexia Yes  . Loss of weight   . Depressed mood   . Esophageal dysmotility   . Gastroparesis   . Perianal dermatitis   . Bleeding internal hemorrhoids   . Anxiety about health    Her GI complaints seems stable to improved.  I think the weight loss is most likely related to anorexia and this probably part of her depressed mood/depression issues.  Amiodarone can cause some of this my sense is that is not the problem however.  Would keep that in mind.  I will refill her Lotrisone topical treatment to use as needed for her perianal dermatitis  We reviewed the potential for investigation with CT scanning or endoscopic evaluation such as colonoscopy and I explained that I do not think that is indicated.  She is not anemic as of March and the bleeding stopped with topical treatment of hemorrhoids with hydrocortisone cream and treatment of the perianal dermatitis.  I think overall from a GI perspective she is improved and I would attribute the weight loss to her depressed mood and I have suggested she return to primary care to consider whether or not there could be some change to her antidepressant medication.  Perhaps the effectiveness of her Lexapro is waning after many years  She will see me as needed  I appreciate the opportunity to care for this patient. CC: [name], [name]    Subjective:   Chief Complaint: Follow-up of perianal dermatitis constipation weight loss anorexia depressed mood  HPI Erica [name] is seen today in the presence of her son Merry Proud.  Last seen in April with rectal bleeding I thought was from hemorrhoids and also perianal dermatitis and some constipation that I thought was related to diverticulosis.  That is all significantly better.  Her weight is down about 5 pounds however and she admits to anorexia and depressed mood.  She is being treated for  urinary tract infection.  She still has significant anxiety over her health complaints" do you think I have cancer?".  She has been on Lexapro for a long time.  Takes amiodarone as well.  Has some bad dreams at times she thinks.  Sleep does get interrupted as well.  Bowel habits are much better no change in stool caliber and moving her bowels reasonably well.  Still has issues with esophageal dysmotility and gastroparesis.  Uses Nexium as needed heartburn.  She is being treated for cystitis and has a urine culture to document eradication pending  Wt Readings from Last 3 Encounters:  05/15/20 128 lb 4 oz (58.2 kg)  04/08/20 133 lb (60.3 kg)  02/25/20 134 lb (60.8 kg)    Allergies  Allergen Reactions  . Contrast Media [Iodinated Diagnostic Agents] Shortness Of Breath, Nausea Only and Swelling  . Iodine Shortness Of Breath, Nausea Only and Swelling  . Iohexol Hives and Swelling     Desc: hives, swelling over 5 yrs ago-pt has never been pre-medicated--was told to not have iv contrast ever   . Ioxaglate Nausea Only, Shortness Of Breath and Swelling  . Metoclopramide Other (See Comments)    Mouth tremors, insomnia, and irritability   Current Meds  Medication Sig  . amiodarone (PACERONE) 200 MG tablet Take 1 tablet (200 mg total) by mouth daily.  Marland Kitchen amLODipine (NORVASC) 5 MG tablet Take 1 tablet (5 mg total) by mouth daily.  Marland Kitchen apixaban (ELIQUIS) 2.5  MG TABS tablet TAKE (1) TABLET TWICE A DAY.  Marland Kitchen atorvastatin (LIPITOR) 10 MG tablet Take 1 tablet (10 mg total) by mouth daily at 6 PM.  . Cholecalciferol (VITAMIN D PO) Take 1 tablet by mouth daily.  . clotrimazole-betamethasone (LOTRISONE) cream Apply 1 application topically 2 (two) times daily. To skin at anus and tailbone area  . escitalopram (LEXAPRO) 20 MG tablet Take 2 tablets (40 mg total) by mouth daily.  Marland Kitchen esomeprazole (NEXIUM) 40 MG capsule Take 1 capsule (40 mg total) by mouth 2 (two) times daily before a meal.  . furosemide  (LASIX) 20 MG tablet Take 1 tablet (20 mg total) by mouth daily.  . hydrocortisone (ANUSOL-HC) 2.5 % rectal cream Place 1 application rectally 2 (two) times daily.  Marland Kitchen levothyroxine (SYNTHROID) 88 MCG tablet Take 1 tablet (88 mcg total) by mouth daily before breakfast.  . Melatonin 5 MG CAPS Take 5 mg by mouth at bedtime as needed (sleep).  . Multiple Vitamin (MULTIVITAMIN WITH MINERALS) TABS tablet Take 1 tablet by mouth daily.  . mupirocin cream (BACTROBAN) 2 % Apply 1 application topically 2 (two) times daily.  Marland Kitchen nystatin (MYCOSTATIN) 100000 UNIT/ML suspension Take 5 mLs (500,000 Units total) by mouth 4 (four) times daily.  Marland Kitchen olmesartan (BENICAR) 40 MG tablet Take 1 tablet (40 mg total) by mouth daily.  . ondansetron (ZOFRAN ODT) 4 MG disintegrating tablet Take 1 tablet (4 mg total) by mouth every 8 (eight) hours as needed for nausea.  Marland Kitchen OVER THE COUNTER MEDICATION IB gard as needed  . valACYclovir (VALTREX) 1000 MG tablet Take 1 tablet (1,000 mg total) by mouth 2 (two) times daily.  . Wheat Dextrin (BENEFIBER) POWD Take 1 Dose by mouth daily. Take daily   . [DISCONTINUED] clotrimazole-betamethasone (LOTRISONE) cream Apply 1 application topically 2 (two) times daily. To skin at anus and tailbone area   Past Medical History:  Diagnosis Date  . Abnormal transaminases 05/09/2019  . Abnormality of gait 08/25/2016  . Adenomatous rectal polyp   . Anxiety about health 05/09/2019  . Arthritis 04/03/2018   L hip  . Atrial fibrillation ([place])   . Bleeding internal hemorrhoids   . Cataract   . Chronic back pain   . Depression   . Diverticulosis of colon (without mention of hemorrhage)   . Gastroparesis   . GERD (gastroesophageal reflux disease)   . Hiatal hernia 09/1999   3cm  . Hyperlipidemia   . Hypertension   . Hypothyroid   . Ischemic colitis ([place])   . Left foot drop 08/25/2016  . Lichen sclerosus et atrophicus of the vulva   . MVP (mitral valve prolapse)   . Osteoporosis   . Sleep  apnea   . TIA (transient ischemic attack)   . Weakness    Past Surgical History:  Procedure Laterality Date  . CARDIAC CATHETERIZATION N/A 02/03/2016   Procedure: Coronary/Graft Angiography;  Surgeon: Charolette Forward, MD;  Location: [facility];  Service: Cardiovascular;  Laterality: N/A;  . CATARACT EXTRACTION    . CHOLECYSTECTOMY    . COLONOSCOPY     Multiple, polyps  . ESOPHAGEAL MANOMETRY N/A 06/14/2016   Procedure: ESOPHAGEAL MANOMETRY (EM);  Surgeon: Gatha Mayer, MD;  Location: [facility];  Service: Endoscopy;  Laterality: N/A;  . LEFT HEART CATHETERIZATION WITH CORONARY ANGIOGRAM N/A 07/17/2013   Procedure: LEFT HEART CATHETERIZATION WITH CORONARY ANGIOGRAM;  Surgeon: Clent Demark, MD;  Location: [facility];  Service: Cardiovascular;  Laterality: N/A;  . SIGMOIDOSCOPY  08/23/2011  . TUBAL LIGATION    . UPPER GASTROINTESTINAL ENDOSCOPY  08/23/2011   Normal   Social History   Social History Narrative   Lives at home alone.   Right-handed.   2 cups caffeine most days.   family history includes Colon cancer in her paternal aunt; Esophageal cancer in her father; Lung cancer in her brother; Stomach cancer in her brother; Stroke in her mother.   Review of Systems As per HPI  Objective:   Physical Exam BP (!) 150/60 (BP Location: Left Arm, Patient Position: Sitting, Cuff Size: Normal)   Pulse 60   Ht 5' 0.5" (1.537 m) Comment: height measured without shoes  Wt 128 lb 4 oz (58.2 kg)   BMI 24.63 kg/m  Pleasant elderly white woman no acute distress  Perianal exam with female staff present shows near resolution of the dermatitis.  I do not see any protruding hemorrhoids digital exam is not performed.

## 2020-05-16 ENCOUNTER — Telehealth: Payer: Self-pay | Admitting: Nurse Practitioner

## 2020-05-16 LAB — URINE CULTURE

## 2020-05-16 NOTE — Telephone Encounter (Signed)
Review and advise. 

## 2020-05-16 NOTE — Telephone Encounter (Signed)
Pt called requesting that MMM review her lab results today if possible so that someone can tell her what her results are.

## 2020-05-16 NOTE — Telephone Encounter (Signed)
Urine culture did not grow anything

## 2020-05-16 NOTE — Telephone Encounter (Signed)
Aware urine culture normal.

## 2020-05-17 ENCOUNTER — Emergency Department (HOSPITAL_COMMUNITY)
Admission: EM | Admit: 2020-05-17 | Discharge: 2020-05-17 | Disposition: A | Discharge status: Home / Self Care | Payer: Medicare Other | Attending: Emergency Medicine | Admitting: Emergency Medicine

## 2020-05-17 ENCOUNTER — Emergency Department (HOSPITAL_COMMUNITY): Payer: Medicare Other

## 2020-05-17 ENCOUNTER — Other Ambulatory Visit: Payer: Self-pay

## 2020-05-17 ENCOUNTER — Encounter (HOSPITAL_COMMUNITY): Payer: Self-pay | Admitting: Emergency Medicine

## 2020-05-17 DIAGNOSIS — I10 Essential (primary) hypertension: Secondary | ICD-10-CM | POA: Insufficient documentation

## 2020-05-17 DIAGNOSIS — Z79899 Other long term (current) drug therapy: Secondary | ICD-10-CM | POA: Insufficient documentation

## 2020-05-17 DIAGNOSIS — R1084 Generalized abdominal pain: Secondary | ICD-10-CM

## 2020-05-17 DIAGNOSIS — K5900 Constipation, unspecified: Secondary | ICD-10-CM | POA: Diagnosis not present

## 2020-05-17 DIAGNOSIS — Z7901 Long term (current) use of anticoagulants: Secondary | ICD-10-CM | POA: Insufficient documentation

## 2020-05-17 DIAGNOSIS — E039 Hypothyroidism, unspecified: Secondary | ICD-10-CM | POA: Diagnosis not present

## 2020-05-17 DIAGNOSIS — R109 Unspecified abdominal pain: Secondary | ICD-10-CM | POA: Diagnosis not present

## 2020-05-17 DIAGNOSIS — R1010 Upper abdominal pain, unspecified: Secondary | ICD-10-CM | POA: Diagnosis present

## 2020-05-17 DIAGNOSIS — Z8673 Personal history of transient ischemic attack (TIA), and cerebral infarction without residual deficits: Secondary | ICD-10-CM | POA: Diagnosis not present

## 2020-05-17 DIAGNOSIS — R140 Abdominal distension (gaseous): Secondary | ICD-10-CM | POA: Insufficient documentation

## 2020-05-17 DIAGNOSIS — Z8719 Personal history of other diseases of the digestive system: Secondary | ICD-10-CM | POA: Diagnosis not present

## 2020-05-17 LAB — COMPREHENSIVE METABOLIC PANEL
ALT: 50 U/L — ABNORMAL HIGH (ref 0–44)
AST: 43 U/L — ABNORMAL HIGH (ref 15–41)
Albumin: 3.9 g/dL (ref 3.5–5.0)
Alkaline Phosphatase: 62 U/L (ref 38–126)
Anion gap: 12 (ref 5–15)
BUN: 12 mg/dL (ref 8–23)
CO2: 23 mmol/L (ref 22–32)
Calcium: 9.4 mg/dL (ref 8.9–10.3)
Chloride: 94 mmol/L — ABNORMAL LOW (ref 98–111)
Creatinine, Ser: 0.68 mg/dL (ref 0.44–1.00)
GFR calc Af Amer: >60 mL/min (ref >60)
GFR calc non Af Amer: >60 mL/min (ref >60)
Glucose, Bld: 101 mg/dL — ABNORMAL HIGH (ref 70–99)
Potassium: 3.5 mmol/L (ref 3.5–5.1)
Sodium: 129 mmol/L — ABNORMAL LOW (ref 135–145)
Total Bilirubin: 0.8 mg/dL (ref 0.3–1.2)
Total Protein: 6.8 g/dL (ref 6.5–8.1)

## 2020-05-17 LAB — CBC WITH DIFFERENTIAL/PLATELET
Abs Immature Granulocytes: 0.03 10*3/uL (ref 0.00–0.07)
Basophils Absolute: 0.1 10*3/uL (ref 0.0–0.1)
Basophils Relative: 1 %
Eosinophils Absolute: 0.1 10*3/uL (ref 0.0–0.5)
Eosinophils Relative: 1 %
HCT: 40.5 % (ref 36.0–46.0)
Hemoglobin: 13.4 g/dL (ref 12.0–15.0)
Immature Granulocytes: 0 %
Lymphocytes Relative: 29 %
Lymphs Abs: 2.7 10*3/uL (ref 0.7–4.0)
MCH: 30.8 pg (ref 26.0–34.0)
MCHC: 33.1 g/dL (ref 30.0–36.0)
MCV: 93.1 fL (ref 80.0–100.0)
Monocytes Absolute: 0.9 10*3/uL (ref 0.1–1.0)
Monocytes Relative: 10 %
Neutro Abs: 5.3 10*3/uL (ref 1.7–7.7)
Neutrophils Relative %: 59 %
Platelets: 203 10*3/uL (ref 150–400)
RBC: 4.35 MIL/uL (ref 3.87–5.11)
RDW: 13 % (ref 11.5–15.5)
WBC: 9.1 10*3/uL (ref 4.0–10.5)
nRBC: 0 % (ref 0.0–0.2)

## 2020-05-17 LAB — URINALYSIS, ROUTINE W REFLEX MICROSCOPIC
Bacteria, UA: NONE SEEN
Bilirubin Urine: NEGATIVE
Glucose, UA: NEGATIVE mg/dL
Ketones, ur: NEGATIVE mg/dL
Leukocytes,Ua: NEGATIVE
Nitrite: NEGATIVE
Protein, ur: NEGATIVE mg/dL
Specific Gravity, Urine: 1.002 — ABNORMAL LOW (ref 1.005–1.030)
pH: 7 (ref 5.0–8.0)

## 2020-05-17 LAB — LIPASE, BLOOD: Lipase: 23 U/L (ref 11–51)

## 2020-05-17 MED ORDER — BISACODYL 10 MG RE SUPP
10.0000 mg | Freq: Once | RECTAL | Status: AC
  Administered 2020-05-17: 10 mg via RECTAL
  Filled 2020-05-17: qty 1

## 2020-05-17 NOTE — ED Provider Notes (Signed)
[facility] Provider Note   CSN: [id number] Arrival date & time: 05/17/20  1600     History Chief Complaint  Patient presents with  . Abdominal Pain    Erica [name] is a 86 y.o. female.  HPI      Erica STROH is a 86 y.o. female with past medical history significant for diverticulosis, gastroparesis, hypertension, hyperlipidemia, and atrial fibrillation, anticoagulated with Eliquis.  Who presents to the Emergency Department complaining of upper abdominal pain for 2 days.  She also states that she has not had a bowel movement in approximately 5 days.  She took a rectal suppository 3 days ago and then again last evening without relief.  She describes the pain in her upper abdomen as constant and "feels like fullness and bloating."  She does report some flatulence.  She denies nausea, vomiting, chest pain, shortness of breath.  No fever or chills.  She states that she recently finished an unknown antibiotic for a urinary tract infection and completed the course 1 week ago.  Abdominal surgeries includes cholecystectomy and tubal ligation.    Past Medical History:  Diagnosis Date  . Abnormal transaminases 05/09/2019  . Abnormality of gait 08/25/2016  . Adenomatous rectal polyp   . Anxiety about health 05/09/2019  . Arthritis 04/03/2018   L hip  . Atrial fibrillation ([place])   . Bleeding internal hemorrhoids   . Cataract   . Chronic back pain   . Depression   . Diverticulosis of colon (without mention of hemorrhage)   . Gastroparesis   . GERD (gastroesophageal reflux disease)   . Hiatal hernia 09/1999   3cm  . Hyperlipidemia   . Hypertension   . Hypothyroid   . Ischemic colitis ([place])   . Left foot drop 08/25/2016  . Lichen sclerosus et atrophicus of the vulva   . MVP (mitral valve prolapse)   . Osteoporosis   . Sleep apnea   . TIA (transient ischemic attack)   . Weakness     Patient Active Problem List   Diagnosis Date Noted  . Perianal  dermatitis 05/15/2020  . Abnormal transaminases 05/09/2019  . Anxiety about health 05/09/2019  . Esophageal dysmotility 06/16/2018  . Abnormality of gait 08/25/2016  . Left foot drop 08/25/2016  . Dysphagia   . Internal bleeding hemorrhoids 01/01/2016  . Hypothyroidism 07/07/2015  . BMI 27.0-27.9,adult 07/07/2015  . HTN (hypertension) 12/13/2012  . Hyperlipidemia 12/13/2012  . Depression 06/06/2012  . Gastroparesis 02/17/2012  . History of colon polyps 08/06/2011  . GERD (gastroesophageal reflux disease) 08/06/2011    Past Surgical History:  Procedure Laterality Date  . CARDIAC CATHETERIZATION N/A 02/03/2016   Procedure: Coronary/Graft Angiography;  Surgeon: Charolette Forward, MD;  Location: [facility];  Service: Cardiovascular;  Laterality: N/A;  . CATARACT EXTRACTION    . CHOLECYSTECTOMY    . COLONOSCOPY     Multiple, polyps  . ESOPHAGEAL MANOMETRY N/A 06/14/2016   Procedure: ESOPHAGEAL MANOMETRY (EM);  Surgeon: Gatha Mayer, MD;  Location: [facility];  Service: Endoscopy;  Laterality: N/A;  . LEFT HEART CATHETERIZATION WITH CORONARY ANGIOGRAM N/A 07/17/2013   Procedure: LEFT HEART CATHETERIZATION WITH CORONARY ANGIOGRAM;  Surgeon: Clent Demark, MD;  Location: [facility];  Service: Cardiovascular;  Laterality: N/A;  . SIGMOIDOSCOPY  08/23/2011  . TUBAL LIGATION    . UPPER GASTROINTESTINAL ENDOSCOPY  08/23/2011   Normal     OB History    Gravida      Para  Term      Preterm      AB      Living  9     SAB      TAB      Ectopic      Multiple      Live Births              Family History  Problem Relation Age of Onset  . Stomach cancer Brother   . Colon cancer Paternal Aunt   . Lung cancer Brother        smoker  . Esophageal cancer Father   . Stroke Mother     Social History   Tobacco Use  . Smoking status: Never Smoker  . Smokeless tobacco: Never Used  Substance Use Topics  . Alcohol use: No  . Drug use: No    Home  Medications Prior to Admission medications   Medication Sig Start Date End Date Taking? Authorizing Provider  amiodarone (PACERONE) 200 MG tablet Take 1 tablet (200 mg total) by mouth daily. Patient taking differently: Take 200 mg by mouth in the morning.  02/25/20  Yes Martin, Mary-Margaret, [name]  amLODipine (NORVASC) 5 MG tablet Take 1 tablet (5 mg total) by mouth daily. Patient taking differently: Take 5 mg by mouth every evening.  02/25/20  Yes Hassell Done, Mary-Margaret, [name]  apixaban (ELIQUIS) 2.5 MG TABS tablet TAKE (1) TABLET TWICE A DAY. Patient taking differently: Take 2.5 mg by mouth 2 (two) times daily. TAKE (1) TABLET TWICE A DAY. 02/25/20  Yes Hassell Done, Mary-Margaret, [name]  atorvastatin (LIPITOR) 10 MG tablet Take 1 tablet (10 mg total) by mouth daily at 6 PM. 02/25/20  Yes Hassell Done, Mary-Margaret, [name]  clotrimazole-betamethasone (LOTRISONE) cream Apply 1 application topically 2 (two) times daily. To skin at anus and tailbone area 05/15/20  Yes Gatha Mayer, MD  escitalopram (LEXAPRO) 20 MG tablet Take 2 tablets (40 mg total) by mouth daily. Patient taking differently: Take 20 mg by mouth in the morning and at bedtime.  02/25/20  Yes Hassell Done, Mary-Margaret, [name]  esomeprazole (NEXIUM) 40 MG capsule Take 1 capsule (40 mg total) by mouth 2 (two) times daily before a meal. Patient taking differently: Take 40 mg by mouth 2 (two) times daily as needed (for acid reflux-GERD).  02/25/20  Yes Martin, Mary-Margaret, [name]  furosemide (LASIX) 20 MG tablet Take 1 tablet (20 mg total) by mouth daily. Patient taking differently: Take 20 mg by mouth daily as needed for fluid.  02/25/20  Yes Martin, Mary-Margaret, [name]  hydrocortisone (ANUSOL-HC) 2.5 % rectal cream Place 1 application rectally 2 (two) times daily. Patient taking differently: Place 1 application rectally 2 (two) times daily as needed for hemorrhoids or anal itching.  04/08/20  Yes Gatha Mayer, MD  levothyroxine (SYNTHROID) 88 MCG tablet Take 1 tablet (88  mcg total) by mouth daily before breakfast. 02/25/20  Yes Hassell Done, Mary-Margaret, [name]  Melatonin 5 MG CAPS Take 5 mg by mouth at bedtime as needed (sleep).   Yes [provider]  Multiple Vitamin (MULTIVITAMIN WITH MINERALS) TABS tablet Take 1 tablet by mouth daily.   Yes [provider]  olmesartan (BENICAR) 40 MG tablet Take 1 tablet (40 mg total) by mouth daily. 02/25/20  Yes Martin, Mary-Margaret, [name]  Cholecalciferol (VITAMIN D PO) Take 1 tablet by mouth daily.    [provider]  mupirocin cream (BACTROBAN) 2 % Apply 1 application topically 2 (two) times daily. 03/21/20   Hassell Done Mary-Margaret, [name]  nystatin (MYCOSTATIN)  100000 UNIT/ML suspension Take 5 mLs (500,000 Units total) by mouth 4 (four) times daily. 08/27/19   Hassell Done, Mary-Margaret, [name]  ondansetron (ZOFRAN ODT) 4 MG disintegrating tablet Take 1 tablet (4 mg total) by mouth every 8 (eight) hours as needed for nausea. 04/23/19   Noemi Chapel, MD  OVER THE COUNTER MEDICATION IB gard as needed    [provider]  valACYclovir (VALTREX) 1000 MG tablet Take 1 tablet (1,000 mg total) by mouth 2 (two) times daily. 05/14/19   Hassell Done, Mary-Margaret, [name]  Wheat Dextrin (BENEFIBER) POWD Take 1 Dose by mouth daily. Take daily     [provider]    Allergies    Contrast media [iodinated diagnostic agents], Iodine, Iohexol, Ioxaglate, and Metoclopramide  Review of Systems   Review of Systems  Constitutional: Negative for appetite change, chills and fever.  Respiratory: Negative for shortness of breath.   Cardiovascular: Negative for chest pain.  Gastrointestinal: Positive for abdominal pain and constipation. Negative for blood in stool, nausea, rectal pain and vomiting.  Genitourinary: Negative for decreased urine volume, difficulty urinating, dysuria and flank pain.  Musculoskeletal: Negative for back pain.  Skin: Negative for color change and rash.  Neurological: Negative for dizziness, weakness and  numbness.  Hematological: Negative for adenopathy.    Physical Exam Updated Vital Signs BP (!) 181/67 (BP Location: Right Arm)   Pulse 62   Temp 97.7 F (36.5 C) (Oral)   Resp 18   Ht 5' 1"  (1.549 m)   Wt 57.2 kg   SpO2 100%   BMI 23.81 kg/m   Physical Exam Vitals and nursing note reviewed.  Constitutional:      Appearance: She is well-developed. She is not ill-appearing or toxic-appearing.  HENT:     Mouth/Throat:     Mouth: Mucous membranes are moist.  Cardiovascular:     Rate and Rhythm: Normal rate and regular rhythm.     Pulses: Normal pulses.  Pulmonary:     Effort: Pulmonary effort is normal.     Breath sounds: Normal breath sounds.  Abdominal:     Palpations: Abdomen is soft. There is no mass.     Tenderness: There is abdominal tenderness. There is no right CVA tenderness, left CVA tenderness or guarding.     Comments: Mild tenderness to palpation of the epigastric region.  No guarding or rebound tenderness.  Musculoskeletal:        General: Normal range of motion.     Right lower leg: No edema.     Left lower leg: No edema.  Skin:    General: Skin is warm.     Capillary Refill: Capillary refill takes less than 2 seconds.     Findings: No rash.  Neurological:     General: No focal deficit present.     Mental Status: She is alert.     Sensory: No sensory deficit.     Motor: No weakness.     ED Results / Procedures / Treatments   Labs (all labs ordered are listed, but only abnormal results are displayed) Labs Reviewed  COMPREHENSIVE METABOLIC PANEL - Abnormal; Notable for the following components:      Result Value   Sodium 129 (*)    Chloride 94 (*)    Glucose, Bld 101 (*)    AST 43 (*)    ALT 50 (*)    All other components within normal limits  URINALYSIS, ROUTINE W REFLEX MICROSCOPIC - Abnormal; Notable for the following components:  Color, Urine COLORLESS (*)    Specific Gravity, Urine 1.002 (*)    Hgb urine dipstick MODERATE (*)    All  other components within normal limits  URINE CULTURE  CBC WITH DIFFERENTIAL/PLATELET  LIPASE, BLOOD    EKG None  Radiology DG Abd 2 Views  Result Date: 05/17/2020 CLINICAL DATA:  History of gastroparesis. EXAM: ABDOMEN - 2 VIEW COMPARISON:  None FINDINGS: No evidence of gastroparesis on this study. Nonobstructive bowel gas pattern. No free air, portal venous gas, or pneumatosis. IMPRESSION: No acute abnormalities. Electronically Signed   By: Dorise Bullion III M.D   On: 05/17/2020 17:45    Procedures Procedures (including critical care time)  Medications Ordered in ED Medications - No data to display  ED Course  I have reviewed the triage vital signs and the nursing notes.  Pertinent labs & imaging results that were available during my care of the patient were reviewed by me and considered in my medical decision making (see chart for details).  Clinical Course as of May 19 1326  Mon May 19, 2020  1325 Comprehensive metabolic panel(!) [TT]    Clinical Course User Index [TT] Kem Parkinson, PA-C   MDM Rules/Calculators/A&P                      Patient here with 2-day history of upper abdominal pain, fullness sensation of her abdomen with bloating and constipation for 5 days.  No fever or history of nausea or vomiting.  Constipation has been unrelieved with over-the-counter suppositories.  Patient is well-appearing, vital signs reviewed.  She is nontoxic appearing.  Abdomen is soft without peritoneal signs.  Given patient's age and medical history, I will obtain laboratory studies including CBC, c-Met and lipase along with urinalysis.  Two-view of the abdomen also ordered to evaluate for degree of possible constipation and further evaluation for SBO.  XR w/o acute findings, negative for bowel obstruction.  Imaging reviewed by me, significant stool burden present. Labs show mild hyponatremia, possibly related to her diuretic.  Advised oral hydration with gatorade. Will order  Dulcolax suppository.   2135  Pt had significant BM and reports feeling better.  I Feel that she is appropriate for discharge home, will also recommend that she use Miralax at home tomorrow.  Discussed care plan this with patient's son as well. Return precautions discussed   Final Clinical Impression(s) / ED Diagnoses Final diagnoses:  Abdominal pain  Constipation, unspecified constipation type    Rx / DC Orders ED Discharge Orders    None       Kem Parkinson, PA-C 05/19/20 1332    Milton Ferguson, MD 05/21/20 1456

## 2020-05-17 NOTE — ED Triage Notes (Signed)
Patient c/o upper abd pain x2 days. Per patient hx of gastroparesis and has not had BM x5 days. Denies any nausea, vomiting, or fevers.

## 2020-05-17 NOTE — Discharge Instructions (Addendum)
Your x-ray this evening shows that you have a significant amount of constipation.  Try taking an oral laxative at home tomorrow such as Miralax.  You can buy this at the drugstore without a prescription.  Mix 1 heaping capful of powder in at least 8 to 10 ounces of water or juice.  Take once to twice a day until significant relief of the constipation.  Follow-up with your primary doctor for recheck.  Return emergency department for any worsening symptoms.

## 2020-05-19 LAB — URINE CULTURE: Culture: <10000 — AB

## 2020-06-03 ENCOUNTER — Other Ambulatory Visit: Payer: Self-pay

## 2020-06-03 ENCOUNTER — Ambulatory Visit (INDEPENDENT_AMBULATORY_CARE_PROVIDER_SITE_OTHER): Payer: Medicare Other | Admitting: Nurse Practitioner

## 2020-06-03 ENCOUNTER — Encounter: Payer: Self-pay | Admitting: Nurse Practitioner

## 2020-06-03 VITALS — BP 137/65 | HR 55 | Temp 96.8°F | Resp 20 | Ht 61.0 in | Wt 129.0 lb

## 2020-06-03 DIAGNOSIS — I2510 Atherosclerotic heart disease of native coronary artery without angina pectoris: Secondary | ICD-10-CM | POA: Diagnosis not present

## 2020-06-03 DIAGNOSIS — F339 Major depressive disorder, recurrent, unspecified: Secondary | ICD-10-CM

## 2020-06-03 MED ORDER — SERTRALINE HCL 100 MG PO TABS: 100.0000 mg | ORAL_TABLET | Freq: Every day | ORAL | 5 refills | Status: DC

## 2020-06-03 MED ORDER — CLOTRIMAZOLE-BETAMETHASONE 1-0.05 % EX CREA: 1.0000 "application " | TOPICAL_CREAM | Freq: Two times a day (BID) | CUTANEOUS | 2 refills | Status: DC

## 2020-06-03 NOTE — Progress Notes (Signed)
   Subjective:    Patient ID: GENEVE KIMPEL, female    DOB: 11/21/1930, 86 y.o.   MRN: [id number]   Chief Complaint: Discuss depression meds   HPI Patient come sin today to discuss her depression medication. She is currently on lexapro 20 mg daily. He ha been to see DR. Gesner about her gastroparesis. They think that her depression is adding  to her problems and that maybe we need to change her lipitor. The patients says she is depressed because of the gastroparesis. Depression screen [facility] 2/9 06/03/2020 06/03/2020 02/25/2020  Decreased Interest 1 0 0  Down, Depressed, Hopeless 2 0 0  PHQ - 2 Score 3 0 0  Altered sleeping 2 - -  Tired, decreased energy 1 - -  Change in appetite 2 - -  Feeling bad or failure about yourself  0 - -  Trouble concentrating 0 - -  Moving slowly or fidgety/restless 0 - -  Suicidal thoughts 0 - -  PHQ-9 Score 8 - -  Difficult doing work/chores Not difficult at all - -  Some recent data might be hidden      Review of Systems  Constitutional: Negative for diaphoresis.  Eyes: Negative for pain.  Respiratory: Negative for shortness of breath.   Cardiovascular: Negative for chest pain, palpitations and leg swelling.  Gastrointestinal: Negative for abdominal pain.  Endocrine: Negative for polydipsia.  Skin: Negative for rash.  Neurological: Negative for dizziness, weakness and headaches.  Hematological: Does not bruise/bleed easily.  All other systems reviewed and are negative.      Objective:   Physical Exam Vitals and nursing note reviewed.  Constitutional:      Appearance: Normal appearance.  Cardiovascular:     Rate and Rhythm: Normal rate and regular rhythm.     Heart sounds: Normal heart sounds.  Pulmonary:     Effort: Pulmonary effort is normal.     Breath sounds: Normal breath sounds.  Abdominal:     General: Abdomen is flat. Bowel sounds are normal. There is no distension.     Palpations: There is no mass.     Tenderness: There is no  abdominal tenderness. There is no guarding.  Skin:    General: Skin is warm and dry.  Neurological:     General: No focal deficit present.     Mental Status: She is alert and oriented to person, place, and time.     Blood pressure 137/65, pulse (!) 55, temperature (!) 96.8 F (36 C), temperature source Temporal, resp. rate 20, height 5\' 1"  (1.549 m), weight 129 lb (58.5 kg), SpO2 97 %.        Assessment & Plan:  TRISTAN BRAMBLE in today with chief complaint of Discuss depression meds   1. Depression, recurrent ([place]) stop lexapro-switch over to zoloft stress management Recheck in 3 weeks - sertraline (ZOLOFT) 100 MG tablet; Take 1 tablet (100 mg total) by mouth daily.  Dispense: 30 tablet; Refill: 5    The above assessment and management plan was discussed with the patient. The patient verbalized understanding of and has agreed to the management plan. Patient is aware to call the clinic if symptoms persist or worsen. Patient is aware when to return to the clinic for a follow-up visit. Patient educated on when it is appropriate to go to the emergency department.   Mary-Margaret Hassell Done, [name]

## 2020-06-03 NOTE — Patient Instructions (Signed)
Stress, Adult Stress is a normal reaction to life events. Stress is what you feel when life demands more than you are used to, or more than you think you can handle. Some stress can be useful, such as studying for a test or meeting a deadline at work. Stress that occurs too often or for too long can cause problems. It can affect your emotional health and interfere with relationships and normal daily activities. Too much stress can weaken your body's defense system (immune system) and increase your risk for physical illness. If you already have a medical problem, stress can make it worse. What are the causes? All sorts of life events can cause stress. An event that causes stress for one person may not be stressful for another person. Major life events, whether positive or negative, commonly cause stress. Examples include:  Losing a job or starting a new job.  Losing a loved one.  Moving to a new town or home.  Getting married or divorced.  Having a baby.  Getting injured or sick. Less obvious life events can also cause stress, especially if they occur day after day or in combination with each other. Examples include:  Working long hours.  Driving in traffic.  Caring for children.  Being in debt.  Being in a difficult relationship. What are the signs or symptoms? Stress can cause emotional symptoms, including:  Anxiety. This is feeling worried, afraid, on edge, overwhelmed, or out of control.  Anger, including irritation or impatience.  Depression. This is feeling sad, down, helpless, or guilty.  Trouble focusing, remembering, or making decisions. Stress can cause physical symptoms, including:  Aches and pains. These may affect your head, neck, back, stomach, or other areas of your body.  Tight muscles or a clenched jaw.  Low energy.  Trouble sleeping. Stress can cause unhealthy behaviors, including:  Eating to feel better (overeating) or skipping meals.  Working too  much or putting off tasks.  Smoking, drinking alcohol, or using drugs to feel better. How is this diagnosed? Stress is diagnosed through an assessment by your health care provider. He or she may diagnose this condition based on:  Your symptoms and any stressful life events.  Your medical history.  Tests to rule out other causes of your symptoms. Depending on your condition, your health care provider may refer you to a specialist for further evaluation. How is this treated?  Stress management techniques are the recommended treatment for stress. Medicine is not typically recommended for the treatment of stress. Techniques to reduce your reaction to stressful life events include:  Stress identification. Monitor yourself for symptoms of stress and identify what causes stress for you. These skills may help you to avoid or prepare for stressful events.  Time management. Set your priorities, keep a calendar of events, and learn to say no. Taking these actions can help you avoid making too many commitments. Techniques for coping with stress include:  Rethinking the problem. Try to think realistically about stressful events rather than ignoring them or overreacting. Try to find the positives in a stressful situation rather than focusing on the negatives.  Exercise. Physical exercise can release both physical and emotional tension. The key is to find a form of exercise that you enjoy and do it regularly.  Relaxation techniques. These relax the body and mind. The key is to find one or more that you enjoy and use the techniques regularly. Examples include: ? Meditation, deep breathing, or progressive relaxation techniques. ? Yoga or   tai chi. ? Biofeedback, mindfulness techniques, or journaling. ? Listening to music, being out in nature, or participating in other hobbies.  Practicing a healthy lifestyle. Eat a balanced diet, drink plenty of water, limit or avoid caffeine, and get plenty of  sleep.  Having a strong support network. Spend time with family, friends, or other people you enjoy being around. Express your feelings and talk things over with someone you trust. Counseling or talk therapy with a mental health professional may be helpful if you are having trouble managing stress on your own. Follow these instructions at home: Lifestyle   Avoid drugs.  Do not use any products that contain nicotine or tobacco, such as cigarettes, e-cigarettes, and chewing tobacco. If you need help quitting, ask your health care provider.  Limit alcohol intake to no more than 1 drink a day for nonpregnant women and 2 drinks a day for men. One drink equals 12 oz of beer, 5 oz of wine, or 1 oz of hard liquor  Do not use alcohol or drugs to relax.  Eat a balanced diet that includes fresh fruits and vegetables, whole grains, lean meats, fish, eggs, and beans, and low-fat dairy. Avoid processed foods and foods high in added fat, sugar, and salt.  Exercise at least 30 minutes on 5 or more days each week.  Get 7-8 hours of sleep each night. General instructions   Practice stress management techniques as discussed with your health care provider.  Drink enough fluid to keep your urine clear or pale yellow.  Take over-the-counter and prescription medicines only as told by your health care provider.  Keep all follow-up visits as told by your health care provider. This is important. Contact a health care provider if:  Your symptoms get worse.  You have new symptoms.  You feel overwhelmed by your problems and can no longer manage them on your own. Get help right away if:  You have thoughts of hurting yourself or others. If you ever feel like you may hurt yourself or others, or have thoughts about taking your own life, get help right away. You can go to your nearest emergency department or call:  Your local emergency services (911 in the [place]  A suicide crisis helpline, such as the  [place] at (316) 250-6172. This is open 24 hours a day. Summary  Stress is a normal reaction to life events. It can cause problems if it happens too often or for too long.  Practicing stress management techniques is the best way to treat stress.  Counseling or talk therapy with a mental health professional may be helpful if you are having trouble managing stress on your own. This information is not intended to replace advice given to you by your health care provider. Make sure you discuss any questions you have with your health care provider. Document Revised: 07/13/2019 Document Reviewed: 02/02/2017 Elsevier Patient Education  [place].

## 2020-08-22 ENCOUNTER — Telehealth: Payer: Self-pay | Admitting: Nurse Practitioner

## 2020-08-22 ENCOUNTER — Other Ambulatory Visit: Payer: Self-pay | Admitting: Family Medicine

## 2020-08-22 DIAGNOSIS — I10 Essential (primary) hypertension: Secondary | ICD-10-CM

## 2020-08-22 MED ORDER — AMLODIPINE BESYLATE 5 MG PO TABS: 5.0000 mg | ORAL_TABLET | Freq: Every evening | ORAL | 0 refills | Status: DC

## 2020-08-22 NOTE — Telephone Encounter (Signed)
Called sent 30 day supply

## 2020-08-28 ENCOUNTER — Ambulatory Visit: Payer: Self-pay | Admitting: Nurse Practitioner

## 2020-09-02 ENCOUNTER — Other Ambulatory Visit: Payer: Self-pay | Admitting: Nurse Practitioner

## 2020-09-02 DIAGNOSIS — I10 Essential (primary) hypertension: Secondary | ICD-10-CM

## 2020-09-02 DIAGNOSIS — I2510 Atherosclerotic heart disease of native coronary artery without angina pectoris: Secondary | ICD-10-CM

## 2020-09-03 ENCOUNTER — Ambulatory Visit: Payer: Self-pay | Admitting: Nurse Practitioner

## 2020-09-19 ENCOUNTER — Other Ambulatory Visit: Payer: Self-pay | Admitting: Nurse Practitioner

## 2020-09-19 DIAGNOSIS — I10 Essential (primary) hypertension: Secondary | ICD-10-CM

## 2020-09-26 DIAGNOSIS — I480 Paroxysmal atrial fibrillation: Secondary | ICD-10-CM | POA: Diagnosis not present

## 2020-09-26 DIAGNOSIS — E785 Hyperlipidemia, unspecified: Secondary | ICD-10-CM | POA: Diagnosis not present

## 2020-09-26 DIAGNOSIS — I2510 Atherosclerotic heart disease of native coronary artery without angina pectoris: Secondary | ICD-10-CM | POA: Diagnosis not present

## 2020-09-26 DIAGNOSIS — I10 Essential (primary) hypertension: Secondary | ICD-10-CM | POA: Diagnosis not present

## 2020-10-03 ENCOUNTER — Other Ambulatory Visit: Payer: Self-pay | Admitting: Nurse Practitioner

## 2020-10-03 DIAGNOSIS — K219 Gastro-esophageal reflux disease without esophagitis: Secondary | ICD-10-CM

## 2020-10-03 DIAGNOSIS — E039 Hypothyroidism, unspecified: Secondary | ICD-10-CM

## 2020-10-03 DIAGNOSIS — I2510 Atherosclerotic heart disease of native coronary artery without angina pectoris: Secondary | ICD-10-CM

## 2020-10-17 ENCOUNTER — Other Ambulatory Visit: Payer: Self-pay | Admitting: Nurse Practitioner

## 2020-10-17 DIAGNOSIS — I10 Essential (primary) hypertension: Secondary | ICD-10-CM

## 2020-10-22 ENCOUNTER — Other Ambulatory Visit: Payer: Medicare Other

## 2020-10-22 ENCOUNTER — Ambulatory Visit (INDEPENDENT_AMBULATORY_CARE_PROVIDER_SITE_OTHER): Payer: Medicare Other

## 2020-10-22 ENCOUNTER — Other Ambulatory Visit: Payer: Self-pay

## 2020-10-22 DIAGNOSIS — I480 Paroxysmal atrial fibrillation: Secondary | ICD-10-CM | POA: Diagnosis not present

## 2020-10-22 DIAGNOSIS — E785 Hyperlipidemia, unspecified: Secondary | ICD-10-CM | POA: Diagnosis not present

## 2020-10-22 DIAGNOSIS — Z23 Encounter for immunization: Secondary | ICD-10-CM

## 2020-10-22 DIAGNOSIS — E039 Hypothyroidism, unspecified: Secondary | ICD-10-CM | POA: Diagnosis not present

## 2020-10-22 DIAGNOSIS — I10 Essential (primary) hypertension: Secondary | ICD-10-CM | POA: Diagnosis not present

## 2020-10-23 ENCOUNTER — Telehealth: Payer: Self-pay | Admitting: *Deleted

## 2020-10-23 DIAGNOSIS — F3341 Major depressive disorder, recurrent, in partial remission: Secondary | ICD-10-CM

## 2020-10-23 MED ORDER — ESCITALOPRAM OXALATE 20 MG PO TABS: 40.0000 mg | ORAL_TABLET | Freq: Every day | ORAL | 1 refills | Status: DC

## 2020-10-23 NOTE — Telephone Encounter (Signed)
Lexapro prescription sent to pharmacy

## 2020-10-23 NOTE — Telephone Encounter (Signed)
Pt aware.

## 2020-10-23 NOTE — Addendum Note (Signed)
Addended by: [name] on: 10/23/2020 11:19 AM   Modules accepted: Orders

## 2020-10-23 NOTE — Telephone Encounter (Signed)
Fax from Progress Note from pharmacy: patient wants Mary-Margaret to know she doesn't think she needs the Zoloft 100 mg 1 QD & wants to continue with the Lexapro 20 mg 2 QD

## 2020-11-04 ENCOUNTER — Other Ambulatory Visit: Payer: Self-pay | Admitting: Nurse Practitioner

## 2020-11-04 DIAGNOSIS — I10 Essential (primary) hypertension: Secondary | ICD-10-CM

## 2020-11-04 DIAGNOSIS — I2510 Atherosclerotic heart disease of native coronary artery without angina pectoris: Secondary | ICD-10-CM

## 2020-11-17 ENCOUNTER — Other Ambulatory Visit: Payer: Self-pay | Admitting: Nurse Practitioner

## 2020-11-17 DIAGNOSIS — I10 Essential (primary) hypertension: Secondary | ICD-10-CM

## 2020-11-18 DIAGNOSIS — Z23 Encounter for immunization: Secondary | ICD-10-CM | POA: Diagnosis not present

## 2020-12-01 ENCOUNTER — Other Ambulatory Visit: Payer: Self-pay | Admitting: Nurse Practitioner

## 2020-12-01 DIAGNOSIS — I2510 Atherosclerotic heart disease of native coronary artery without angina pectoris: Secondary | ICD-10-CM

## 2020-12-01 DIAGNOSIS — I10 Essential (primary) hypertension: Secondary | ICD-10-CM

## 2020-12-05 DIAGNOSIS — I10 Essential (primary) hypertension: Secondary | ICD-10-CM | POA: Diagnosis not present

## 2020-12-05 DIAGNOSIS — R0989 Other specified symptoms and signs involving the circulatory and respiratory systems: Secondary | ICD-10-CM | POA: Diagnosis not present

## 2020-12-05 DIAGNOSIS — I4891 Unspecified atrial fibrillation: Secondary | ICD-10-CM | POA: Diagnosis not present

## 2020-12-10 DIAGNOSIS — I10 Essential (primary) hypertension: Secondary | ICD-10-CM | POA: Diagnosis not present

## 2020-12-10 DIAGNOSIS — I480 Paroxysmal atrial fibrillation: Secondary | ICD-10-CM | POA: Diagnosis not present

## 2020-12-10 DIAGNOSIS — E039 Hypothyroidism, unspecified: Secondary | ICD-10-CM | POA: Diagnosis not present

## 2020-12-10 DIAGNOSIS — E785 Hyperlipidemia, unspecified: Secondary | ICD-10-CM | POA: Diagnosis not present

## 2020-12-10 DIAGNOSIS — I2510 Atherosclerotic heart disease of native coronary artery without angina pectoris: Secondary | ICD-10-CM | POA: Diagnosis not present

## 2020-12-18 ENCOUNTER — Other Ambulatory Visit: Payer: Self-pay | Admitting: Nurse Practitioner

## 2020-12-18 DIAGNOSIS — I10 Essential (primary) hypertension: Secondary | ICD-10-CM

## 2020-12-18 NOTE — Telephone Encounter (Signed)
Pt made an appointment in January for med refill of her blood pressure meds. She will be out today and needs enough to last until appointment

## 2020-12-18 NOTE — Telephone Encounter (Signed)
MMM NTBS 30 days given 11/18/20

## 2020-12-30 ENCOUNTER — Other Ambulatory Visit: Payer: Self-pay | Admitting: Nurse Practitioner

## 2020-12-30 DIAGNOSIS — K219 Gastro-esophageal reflux disease without esophagitis: Secondary | ICD-10-CM

## 2020-12-30 DIAGNOSIS — I10 Essential (primary) hypertension: Secondary | ICD-10-CM

## 2020-12-30 DIAGNOSIS — I2510 Atherosclerotic heart disease of native coronary artery without angina pectoris: Secondary | ICD-10-CM

## 2021-01-06 ENCOUNTER — Encounter: Payer: Self-pay | Admitting: Nurse Practitioner

## 2021-01-06 ENCOUNTER — Ambulatory Visit (INDEPENDENT_AMBULATORY_CARE_PROVIDER_SITE_OTHER): Payer: Medicare Other | Admitting: Nurse Practitioner

## 2021-01-06 DIAGNOSIS — N3000 Acute cystitis without hematuria: Secondary | ICD-10-CM

## 2021-01-06 MED ORDER — CEPHALEXIN 500 MG PO CAPS: 500.0000 mg | ORAL_CAPSULE | Freq: Two times a day (BID) | ORAL | 0 refills | Status: DC

## 2021-01-06 NOTE — Progress Notes (Signed)
   Virtual Visit via telephone Note Due to COVID-19 pandemic this visit was conducted virtually. This visit type was conducted due to national recommendations for restrictions regarding the COVID-19 Pandemic (e.g. social distancing, sheltering in place) in an effort to limit this patient's exposure and mitigate transmission in our community. All issues noted in this document were discussed and addressed.  A physical exam was not performed with this format.   Attempted to call - left message at 9:15  I connected with Erica [name] on 01/06/21 at 9:50 by telephone and verified that I am speaking with the correct person using two identifiers. Erica [name] is currently located at home  And no one  is currently with her during visit. The provider, Mary-Margaret Hassell Done, [name] is located in their office at time of visit.  I discussed the limitations, risks, security and privacy concerns of performing an evaluation and management service by telephone and the availability of in person appointments. I also discussed with the patient that there may be a patient responsible charge related to this service. The patient expressed understanding and agreed to proceed.   History and Present Illness:   Chief Complaint: Urinary Tract Infection   HPI Patient says she developed urinary frequency 3 days ago. Now she has burning, urinary frequency and urgency. Has to get up several times last night to void.   Review of Systems  Constitutional: Negative for chills, diaphoresis, fever and malaise/fatigue.  HENT: Negative.   Respiratory: Negative.   Cardiovascular: Negative.   Gastrointestinal: Negative.   Genitourinary: Positive for dysuria, frequency and urgency.  Neurological: Negative.   Psychiatric/Behavioral: Negative.   All other systems reviewed and are negative.    Observations/Objective: Alert and oriented- answers all questions appropriately No distress    Assessment and Plan: Erica [name] in today with chief complaint of Urinary Tract Infection   1. Acute cystitis without hematuria Take medication as prescribe Cotton underwear Take shower not bath Cranberry juice, yogurt Force fluids AZO over the counter X2 days RTO prn  - cephALEXin (KEFLEX) 500 MG capsule; Take 1 capsule (500 mg total) by mouth 2 (two) times daily.  Dispense: 14 capsule; Refill: 0    Follow Up Instructions: prn    I discussed the assessment and treatment plan with the patient. The patient was provided an opportunity to ask questions and all were answered. The patient agreed with the plan and demonstrated an understanding of the instructions.   The patient was advised to call back or seek an in-person evaluation if the symptoms worsen or if the condition fails to improve as anticipated.  The above assessment and management plan was discussed with the patient. The patient verbalized understanding of and has agreed to the management plan. Patient is aware to call the clinic if symptoms persist or worsen. Patient is aware when to return to the clinic for a follow-up visit. Patient educated on when it is appropriate to go to the emergency department.   Time call ended:  10:04  I provided *14** minutes of non-face-to-face time during this encounter.    Mary-Margaret Hassell Done, [name]

## 2021-01-15 ENCOUNTER — Ambulatory Visit: Payer: Medicare Other | Admitting: Nurse Practitioner

## 2021-01-15 ENCOUNTER — Other Ambulatory Visit: Payer: Self-pay | Admitting: Nurse Practitioner

## 2021-01-15 DIAGNOSIS — I10 Essential (primary) hypertension: Secondary | ICD-10-CM

## 2021-01-30 ENCOUNTER — Other Ambulatory Visit: Payer: Self-pay | Admitting: Nurse Practitioner

## 2021-01-30 DIAGNOSIS — I2510 Atherosclerotic heart disease of native coronary artery without angina pectoris: Secondary | ICD-10-CM

## 2021-01-30 DIAGNOSIS — I10 Essential (primary) hypertension: Secondary | ICD-10-CM

## 2021-02-14 ENCOUNTER — Other Ambulatory Visit: Payer: Self-pay | Admitting: Nurse Practitioner

## 2021-02-14 DIAGNOSIS — I10 Essential (primary) hypertension: Secondary | ICD-10-CM

## 2021-02-19 ENCOUNTER — Encounter: Payer: Self-pay | Admitting: Nurse Practitioner

## 2021-02-19 ENCOUNTER — Ambulatory Visit (INDEPENDENT_AMBULATORY_CARE_PROVIDER_SITE_OTHER): Payer: Medicare Other | Admitting: Nurse Practitioner

## 2021-02-19 ENCOUNTER — Other Ambulatory Visit: Payer: Self-pay

## 2021-02-19 ENCOUNTER — Ambulatory Visit (INDEPENDENT_AMBULATORY_CARE_PROVIDER_SITE_OTHER): Payer: Medicare Other

## 2021-02-19 VITALS — BP 137/62 | HR 58 | Temp 97.5°F | Ht 61.0 in | Wt 130.0 lb

## 2021-02-19 DIAGNOSIS — E782 Mixed hyperlipidemia: Secondary | ICD-10-CM

## 2021-02-19 DIAGNOSIS — M810 Age-related osteoporosis without current pathological fracture: Secondary | ICD-10-CM

## 2021-02-19 DIAGNOSIS — K3184 Gastroparesis: Secondary | ICD-10-CM | POA: Diagnosis not present

## 2021-02-19 DIAGNOSIS — I2510 Atherosclerotic heart disease of native coronary artery without angina pectoris: Secondary | ICD-10-CM | POA: Diagnosis not present

## 2021-02-19 DIAGNOSIS — F3341 Major depressive disorder, recurrent, in partial remission: Secondary | ICD-10-CM

## 2021-02-19 DIAGNOSIS — K219 Gastro-esophageal reflux disease without esophagitis: Secondary | ICD-10-CM | POA: Diagnosis not present

## 2021-02-19 DIAGNOSIS — Z6827 Body mass index (BMI) 27.0-27.9, adult: Secondary | ICD-10-CM

## 2021-02-19 DIAGNOSIS — I10 Essential (primary) hypertension: Secondary | ICD-10-CM

## 2021-02-19 DIAGNOSIS — F418 Other specified anxiety disorders: Secondary | ICD-10-CM

## 2021-02-19 DIAGNOSIS — M21372 Foot drop, left foot: Secondary | ICD-10-CM

## 2021-02-19 DIAGNOSIS — E039 Hypothyroidism, unspecified: Secondary | ICD-10-CM

## 2021-02-19 DIAGNOSIS — R4589 Other symptoms and signs involving emotional state: Secondary | ICD-10-CM

## 2021-02-19 MED ORDER — ESCITALOPRAM OXALATE 20 MG PO TABS: 40.0000 mg | ORAL_TABLET | Freq: Every day | ORAL | 1 refills | Status: DC

## 2021-02-19 MED ORDER — LEVOTHYROXINE SODIUM 88 MCG PO TABS: ORAL_TABLET | ORAL | 1 refills | Status: DC

## 2021-02-19 MED ORDER — ATORVASTATIN CALCIUM 10 MG PO TABS: ORAL_TABLET | ORAL | 1 refills | Status: DC

## 2021-02-19 MED ORDER — FUROSEMIDE 20 MG PO TABS: 20.0000 mg | ORAL_TABLET | Freq: Every day | ORAL | 1 refills | Status: DC

## 2021-02-19 MED ORDER — ESOMEPRAZOLE MAGNESIUM 40 MG PO CPDR: DELAYED_RELEASE_CAPSULE | ORAL | 1 refills | Status: DC

## 2021-02-19 MED ORDER — OLMESARTAN MEDOXOMIL 40 MG PO TABS: 40.0000 mg | ORAL_TABLET | Freq: Every day | ORAL | 1 refills | Status: DC

## 2021-02-19 MED ORDER — APIXABAN 2.5 MG PO TABS: ORAL_TABLET | ORAL | 5 refills | Status: DC

## 2021-02-19 MED ORDER — AMIODARONE HCL 200 MG PO TABS: 200.0000 mg | ORAL_TABLET | Freq: Every day | ORAL | 1 refills | Status: DC

## 2021-02-19 MED ORDER — AMLODIPINE BESYLATE 5 MG PO TABS: ORAL_TABLET | ORAL | 1 refills | Status: DC

## 2021-02-19 NOTE — Progress Notes (Signed)
Subjective:    Patient ID: Erica [name], female    DOB: [birth year], 87 y.o.   MRN: [id number]   Chief Complaint: Routine follow up for chronic disease management   HPI:  1. Primary hypertension On amlodipine and olmasartan. Tolerating well.   BP Readings from Last 3 Encounters:  02/19/21 137/62  06/03/20 137/65  05/17/20 (!) 164/60    2. Mixed hyperlipidemia Taking atorvastatin with some c/o myalgia. Taking it 2-3 times per week to help with secondary symptoms.   3. Gastroesophageal reflux disease without esophagitis Controlled well with Nexium. Avoids spicy foods and eating late at night.   4. Gastroparesis Doing well with nexium. Her food does not seem to "come up" that much recently.   5. Acquired hypothyroidism Taking synthroid every day with no adverse reactions. No complaints of increased fatigue, dry skin, cold intolerance.   6. Anxiety about health Taking lexapro daily. Anxiety is controlled well on lexapro.  7. Recurrent major depressive disorder, in partial remission ([place]) Taking lexapro daily. Been feeling well on this. Has not had any recent issues with depression.   8. Left foot drop Feeling like it is getting better. Does not keep her from doing her daily activities.   9. BMI 27.0-27.9,adult Eats well. Tries to move with housework and walking her dog several times per day.    Wt Readings from Last 3 Encounters:  02/19/21 130 lb (59 kg)  06/03/20 129 lb (58.5 kg)  05/17/20 126 lb (57.2 kg)       Outpatient Encounter Medications as of 02/19/2021  Medication Sig  . amiodarone (PACERONE) 200 MG tablet TAKE 1 TABLET DAILY  . amLODipine (NORVASC) 5 MG tablet TAKE ONE TABLET BY MOUTH TWICE DAILY (MUST BE SEEN BEFORE NEXT REFILL)  . atorvastatin (LIPITOR) 10 MG tablet TAKE 1 TABLET ONCE DAILY AT 6PM  . cephALEXin (KEFLEX) 500 MG capsule Take 1 capsule (500 mg total) by mouth 2 (two) times daily.  . Cholecalciferol (VITAMIN D3) 125 MCG (5000 UT) CAPS  Take 1 capsule by mouth every other day.  . clotrimazole-betamethasone (LOTRISONE) cream Apply 1 application topically 2 (two) times daily. To skin at anus and tailbone area  . ELIQUIS 2.5 MG TABS tablet TAKE (1) TABLET TWICE A DAY.  Marland Kitchen escitalopram (LEXAPRO) 20 MG tablet Take 2 tablets (40 mg total) by mouth daily.  Marland Kitchen esomeprazole (NEXIUM) 40 MG capsule TAKE 1 CAPSULE TWICE DAILY BEFORE MEALS  . furosemide (LASIX) 20 MG tablet TAKE 1 TABLET DAILY  . hydrocortisone (ANUSOL-HC) 2.5 % rectal cream Place 1 application rectally 2 (two) times daily. (Patient taking differently: Place 1 application rectally 2 (two) times daily as needed for hemorrhoids or anal itching. )  . levothyroxine (SYNTHROID) 88 MCG tablet TAKE (1) TABLET DAILY BE- FORE BREAKFAST.  . Melatonin 5 MG CAPS Take 5 mg by mouth at bedtime as needed (sleep).  . Multiple Vitamin (MULTIVITAMIN WITH MINERALS) TABS tablet Take 1 tablet by mouth daily.  . mupirocin cream (BACTROBAN) 2 % Apply 1 application topically 2 (two) times daily.  Marland Kitchen olmesartan (BENICAR) 40 MG tablet TAKE 1 TABLET DAILY  . Wheat Dextrin (BENEFIBER) POWD Take 1 Dose by mouth daily. Take daily    No facility-administered encounter medications on file as of 02/19/2021.    Past Surgical History:  Procedure Laterality Date  . CARDIAC CATHETERIZATION N/A 02/03/2016   Procedure: Coronary/Graft Angiography;  Surgeon: Charolette Forward, MD;  Location: [facility];  Service: Cardiovascular;  Laterality: N/A;  .  CATARACT EXTRACTION    . CHOLECYSTECTOMY    . COLONOSCOPY     Multiple, polyps  . ESOPHAGEAL MANOMETRY N/A 06/14/2016   Procedure: ESOPHAGEAL MANOMETRY (EM);  Surgeon: Gatha Mayer, MD;  Location: [facility];  Service: Endoscopy;  Laterality: N/A;  . LEFT HEART CATHETERIZATION WITH CORONARY ANGIOGRAM N/A 07/17/2013   Procedure: LEFT HEART CATHETERIZATION WITH CORONARY ANGIOGRAM;  Surgeon: Clent Demark, MD;  Location: [facility];  Service: Cardiovascular;   Laterality: N/A;  . SIGMOIDOSCOPY  08/23/2011  . TUBAL LIGATION    . UPPER GASTROINTESTINAL ENDOSCOPY  08/23/2011   Normal    Family History  Problem Relation Age of Onset  . Stomach cancer Brother   . Colon cancer Paternal Aunt   . Lung cancer Brother        smoker  . Esophageal cancer Father   . Stroke Mother     New complaints: Lower back pain. Takes tylenol PRN, but has not taking it often because of her gastroparesis. Dexascan 2017 showed osteoporosis.  Social history: Lives with by herself. Able to afford medications  Controlled substance contract: N/A     Review of Systems  Constitutional: Negative for activity change, chills, fatigue and fever.  Respiratory: Negative for cough, shortness of breath and wheezing.   Cardiovascular: Negative for chest pain, palpitations and leg swelling.  Gastrointestinal: Positive for blood in stool. Negative for abdominal distention, abdominal pain, constipation and diarrhea.  Endocrine: Negative for cold intolerance.  Genitourinary: Negative for difficulty urinating and dysuria.  Musculoskeletal: Positive for back pain and myalgias.  Neurological: Negative for dizziness, syncope, light-headedness and headaches.  Psychiatric/Behavioral: Negative for behavioral problems and confusion. The patient is not nervous/anxious.        Objective:   Physical Exam Constitutional:      Appearance: Normal appearance.  Eyes:     Pupils: Pupils are equal, round, and reactive to light.  Cardiovascular:     Rate and Rhythm: Normal rate and regular rhythm.     Pulses: Normal pulses.     Heart sounds: Normal heart sounds.  Pulmonary:     Effort: Pulmonary effort is normal.     Breath sounds: Normal breath sounds.  Abdominal:     General: Abdomen is flat. Bowel sounds are normal.     Palpations: Abdomen is soft.  Musculoskeletal:        General: Normal range of motion.     Cervical back: Normal range of motion and neck supple.  Skin:     General: Skin is warm and dry.     Capillary Refill: Capillary refill takes less than 2 seconds.  Neurological:     Mental Status: She is alert and oriented to person, place, and time. Mental status is at baseline.  Psychiatric:        Mood and Affect: Mood normal.        Thought Content: Thought content normal.        Judgment: Judgment normal.           Assessment & Plan:   Erica [name] comes in today with chief complaint of Medical Management of Chronic Issues, Hypertension, and Hypothyroidism   Diagnosis and orders addressed:  1. Primary hypertension Continue healthy diet, low sodium, regular exercise, and daily medications.   - amLODipine (NORVASC) 5 MG tablet; TAKE ONE TABLET BY MOUTH TWICE DAILY (MUST BE SEEN BEFORE NEXT REFILL)  Dispense: 180 tablet; Refill: 1 - olmesartan (BENICAR) 40 MG tablet; Take 1 tablet (40  mg total) by mouth daily.  Dispense: 90 tablet; Refill: 1 - furosemide (LASIX) 20 MG tablet; Take 1 tablet (20 mg total) by mouth daily.  Dispense: 90 tablet; Refill: 1  2. Mixed hyperlipidemia Continue atorvastatin as prescribed. Continue low fat diet and regular exercise.  - atorvastatin (LIPITOR) 10 MG tablet; TAKE 1 TABLET ONCE DAILY AT 6PM  Dispense: 90 tablet; Refill: 1  3. Gastroesophageal reflux disease without esophagitis Continue esomeperazole. Avoid spicy foods and eating late at night.  - esomeprazole (NEXIUM) 40 MG capsule; TAKE 1 CAPSULE TWICE DAILY BEFORE MEALS  Dispense: 180 capsule; Refill: 1  4. Gastroparesis Small meals throughout the day. Avoid heavy meals with spice and fat.   5. Acquired hypothyroidism Continue synthroid.  - levothyroxine (SYNTHROID) 88 MCG tablet; TAKE (1) TABLET DAILY BE- FORE BREAKFAST.  Dispense: 90 tablet; Refill: 1  6. Anxiety about health Continue lexapro, exercise daily.   7. Recurrent major depressive disorder, in partial remission ([facility]) Continue lexapro and daily exercise.  - escitalopram (LEXAPRO)  20 MG tablet; Take 2 tablets (40 mg total) by mouth daily.  Dispense: 180 tablet; Refill: 1  8. Left foot drop Managed with walking  9. BMI 27.0-27.9,adult Continue healthy diet.   10. Age-related osteoporosis without current pathological fracture Dexascan today. Will notify patient when it results.  - DG [facility] DEXA  11. Coronary artery disease involving native coronary artery of native heart without angina pectoris Continue eliquis, amiodarone, followed by cardiology.  - apixaban (ELIQUIS) 2.5 MG TABS tablet; TAKE (1) TABLET TWICE A DAY.  Dispense: 60 tablet; Refill: 5 - amiodarone (PACERONE) 200 MG tablet; Take 1 tablet (200 mg total) by mouth daily.  Dispense: 90 tablet; Refill: 1  Orders Placed This Encounter  Procedures  . DG [facility] DEXA    Order Specific Question:   Reason for Exam (SYMPTOM  OR DIAGNOSIS REQUIRED)    Answer:   screening  . CBC with Differential/Platelet  . CMP14+EGFR  . Lipid panel  . Thyroid Panel With TSH    Labs pending Health Maintenance reviewed Diet and exercise encouraged  Follow up plan: 3 months.  Dollene Primrose, RN, BSN, [name]-Student  Mary-Margaret Hassell Done, [name]

## 2021-02-19 NOTE — Patient Instructions (Signed)
Acute Back Pain, Adult Acute back pain is sudden and usually short-lived. It is often caused by an injury to the muscles and tissues in the back. The injury may result from:  A muscle or ligament getting overstretched or torn (strained). Ligaments are tissues that connect bones to each other. Lifting something improperly can cause a back strain.  Wear and tear (degeneration) of the spinal disks. Spinal disks are circular tissue that provide cushioning between the bones of the spine (vertebrae).  Twisting motions, such as while playing sports or doing yard work.  A hit to the back.  Arthritis. You may have a physical exam, lab tests, and imaging tests to find the cause of your pain. Acute back pain usually goes away with rest and home care. Follow these instructions at home: Managing pain, stiffness, and swelling  Treatment may include medicines for pain and inflammation that are taken by mouth or applied to the skin, prescription pain medicine, or muscle relaxants. Take over-the-counter and prescription medicines only as told by your health care provider.  Your health care provider may recommend applying ice during the first 24-48 hours after your pain starts. To do this: ? Put ice in a plastic bag. ? Place a towel between your skin and the bag. ? Leave the ice on for 20 minutes, 2-3 times a day.  If directed, apply heat to the affected area as often as told by your health care provider. Use the heat source that your health care provider recommends, such as a moist heat pack or a heating pad. ? Place a towel between your skin and the heat source. ? Leave the heat on for 20-30 minutes. ? Remove the heat if your skin turns bright red. This is especially important if you are unable to feel pain, heat, or cold. You have a greater risk of getting burned. Activity  Do not stay in bed. Staying in bed for more than 1-2 days can delay your recovery.  Sit up and stand up straight. Avoid leaning  forward when you sit or hunching over when you stand. ? If you work at a desk, sit close to it so you do not need to lean over. Keep your chin tucked in. Keep your neck drawn back, and keep your elbows bent at a 90-degree angle (right angle). ? Sit high and close to the steering wheel when you drive. Add lower back (lumbar) support to your car seat, if needed.  Take short walks on even surfaces as soon as you are able. Try to increase the length of time you walk each day.  Do not sit, drive, or stand in one place for more than 30 minutes at a time. Sitting or standing for long periods of time can put stress on your back.  Do not drive or use heavy machinery while taking prescription pain medicine.  Use proper lifting techniques. When you bend and lift, use positions that put less stress on your back: ? Bend your knees. ? Keep the load close to your body. ? Avoid twisting.  Exercise regularly as told by your health care provider. Exercising helps your back heal faster and helps prevent back injuries by keeping muscles strong and flexible.  Work with a physical therapist to make a safe exercise program, as recommended by your health care provider. Do any exercises as told by your physical therapist.   Lifestyle  Maintain a healthy weight. Extra weight puts stress on your back and makes it difficult to have   good posture.  Avoid activities or situations that make you feel anxious or stressed. Stress and anxiety increase muscle tension and can make back pain worse. Learn ways to manage anxiety and stress, such as through exercise. General instructions  Sleep on a firm mattress in a comfortable position. Try lying on your side with your knees slightly bent. If you lie on your back, put a pillow under your knees.  Follow your treatment plan as told by your health care provider. This may include: ? Cognitive or behavioral therapy. ? Acupuncture or massage therapy. ? Meditation or yoga. Contact  a health care provider if:  You have pain that is not relieved with rest or medicine.  You have increasing pain going down into your legs or buttocks.  Your pain does not improve after 2 weeks.  You have pain at night.  You lose weight without trying.  You have a fever or chills. Get help right away if:  You develop new bowel or bladder control problems.  You have unusual weakness or numbness in your arms or legs.  You develop nausea or vomiting.  You develop abdominal pain.  You feel faint. Summary  Acute back pain is sudden and usually short-lived.  Use proper lifting techniques. When you bend and lift, use positions that put less stress on your back.  Take over-the-counter and prescription medicines and apply heat or ice as directed by your health care provider. This information is not intended to replace advice given to you by your health care provider. Make sure you discuss any questions you have with your health care provider. Document Revised: 09/05/2020 Document Reviewed: 09/05/2020 Elsevier Patient Education  2021 Elsevier Inc.  

## 2021-02-20 LAB — CMP14+EGFR
ALT: 25 IU/L (ref 0–32)
AST: 27 IU/L (ref 0–40)
Albumin/Globulin Ratio: 2 (ref 1.2–2.2)
Albumin: 4.1 g/dL (ref 3.6–4.6)
Alkaline Phosphatase: 63 IU/L (ref 44–121)
BUN/Creatinine Ratio: 18 (ref 12–28)
BUN: 15 mg/dL (ref 8–27)
Bilirubin Total: 0.4 mg/dL (ref 0.0–1.2)
CO2: 22 mmol/L (ref 20–29)
Calcium: 9.4 mg/dL (ref 8.7–10.3)
Chloride: 100 mmol/L (ref 96–106)
Creatinine, Ser: 0.85 mg/dL (ref 0.57–1.00)
GFR calc Af Amer: 71 mL/min/{1.73_m2} (ref >59)
GFR calc non Af Amer: 61 mL/min/{1.73_m2} (ref >59)
Globulin, Total: 2.1 g/dL (ref 1.5–4.5)
Glucose: 97 mg/dL (ref 65–99)
Potassium: 4.6 mmol/L (ref 3.5–5.2)
Sodium: 137 mmol/L (ref 134–144)
Total Protein: 6.2 g/dL (ref 6.0–8.5)

## 2021-02-20 LAB — THYROID PANEL WITH TSH
Free Thyroxine Index: 4.2 (ref 1.2–4.9)
T3 Uptake Ratio: 36 % (ref 24–39)
T4, Total: 11.6 ug/dL (ref 4.5–12.0)
TSH: 0.59 u[IU]/mL (ref 0.450–4.500)

## 2021-02-20 LAB — CBC WITH DIFFERENTIAL/PLATELET
Basophils Absolute: 0.1 10*3/uL (ref 0.0–0.2)
Basos: 1 %
EOS (ABSOLUTE): 0.1 10*3/uL (ref 0.0–0.4)
Eos: 1 %
Hematocrit: 39.3 % (ref 34.0–46.6)
Hemoglobin: 13.1 g/dL (ref 11.1–15.9)
Immature Grans (Abs): 0 10*3/uL (ref 0.0–0.1)
Immature Granulocytes: 0 %
Lymphocytes Absolute: 3 10*3/uL (ref 0.7–3.1)
Lymphs: 32 %
MCH: 30.7 pg (ref 26.6–33.0)
MCHC: 33.3 g/dL (ref 31.5–35.7)
MCV: 92 fL (ref 79–97)
Monocytes Absolute: 0.7 10*3/uL (ref 0.1–0.9)
Monocytes: 7 %
Neutrophils Absolute: 5.5 10*3/uL (ref 1.4–7.0)
Neutrophils: 59 %
Platelets: 210 10*3/uL (ref 150–450)
RBC: 4.27 x10E6/uL (ref 3.77–5.28)
RDW: 12.5 % (ref 11.7–15.4)
WBC: 9.3 10*3/uL (ref 3.4–10.8)

## 2021-02-20 LAB — LIPID PANEL
Chol/HDL Ratio: 2.5 ratio (ref 0.0–4.4)
Cholesterol, Total: 148 mg/dL (ref 100–199)
HDL: 60 mg/dL (ref >39)
LDL Chol Calc (NIH): 71 mg/dL (ref 0–99)
Triglycerides: 91 mg/dL (ref 0–149)
VLDL Cholesterol Cal: 17 mg/dL (ref 5–40)

## 2021-03-10 DIAGNOSIS — L900 Lichen sclerosus et atrophicus: Secondary | ICD-10-CM | POA: Diagnosis not present

## 2021-04-02 ENCOUNTER — Telehealth: Payer: Self-pay

## 2021-04-02 DIAGNOSIS — Z780 Asymptomatic menopausal state: Secondary | ICD-10-CM | POA: Diagnosis not present

## 2021-04-03 NOTE — Telephone Encounter (Signed)
Still waiting on report - call radiology to get pushed through - called and lm for the patient

## 2021-04-07 NOTE — Telephone Encounter (Signed)
Waiting on reading from radiology - have contacted them multiple times to get done

## 2021-04-08 NOTE — Telephone Encounter (Signed)
Patient aware that Erica [name] is out of the office until Monday

## 2021-04-23 DIAGNOSIS — I10 Essential (primary) hypertension: Secondary | ICD-10-CM | POA: Diagnosis not present

## 2021-04-23 DIAGNOSIS — E039 Hypothyroidism, unspecified: Secondary | ICD-10-CM | POA: Diagnosis not present

## 2021-04-23 DIAGNOSIS — I2510 Atherosclerotic heart disease of native coronary artery without angina pectoris: Secondary | ICD-10-CM | POA: Diagnosis not present

## 2021-04-23 DIAGNOSIS — E785 Hyperlipidemia, unspecified: Secondary | ICD-10-CM | POA: Diagnosis not present

## 2021-04-23 DIAGNOSIS — I480 Paroxysmal atrial fibrillation: Secondary | ICD-10-CM | POA: Diagnosis not present

## 2021-04-28 ENCOUNTER — Other Ambulatory Visit: Payer: Self-pay | Admitting: Nurse Practitioner

## 2021-04-28 DIAGNOSIS — I10 Essential (primary) hypertension: Secondary | ICD-10-CM

## 2021-04-30 ENCOUNTER — Other Ambulatory Visit: Payer: Self-pay

## 2021-04-30 ENCOUNTER — Ambulatory Visit (INDEPENDENT_AMBULATORY_CARE_PROVIDER_SITE_OTHER): Payer: Medicare Other | Admitting: Pharmacist

## 2021-04-30 DIAGNOSIS — M810 Age-related osteoporosis without current pathological fracture: Secondary | ICD-10-CM | POA: Diagnosis not present

## 2021-04-30 DIAGNOSIS — I2510 Atherosclerotic heart disease of native coronary artery without angina pectoris: Secondary | ICD-10-CM

## 2021-04-30 MED ORDER — ALENDRONATE SODIUM 70 MG PO TABS: 70.0000 mg | ORAL_TABLET | ORAL | 11 refills | Status: DC

## 2021-04-30 NOTE — Progress Notes (Signed)
    05/01/2021 Name: Erica [name] MRN: [id number] DOB: 01-22-1930   Current Height:   5'1"     Max Lifetime Height:  5'1" Current Weight:    130lb     Ethnicity:Caucasian   HPI: Does pt already have a diagnosis of:  Osteoporosis?  Yes  Back Pain? yes Prior fracture?  No Med(s) for Osteoporosis/Osteopenia:  Vitamin D                                                             PMH: Age at menopause:  81s HRT? No Steroid Use?  No Thyroid med?  Yes History of cancer?  No History of digestive disorders (ie Crohn's)?  Yes Current or previous eating disorders?  No Last Vitamin D Result:  45 08/27/2019 Last GFR Result:  61 on 02/19/21   FH/SH: Family history of osteoporosis?  No Parent with history of hip fracture?  No Family history of breast cancer?  No Exercise?  No Smoking?  No Alcohol?  No    Calcium Assessment Calcium Intake  # of servings/day  Calcium mg  Milk (8 oz) 1  x  300  = 300  Yogurt (4 oz) 0 x  200 = 0  Cheese (1 oz) 0 x  200 = 0  Other Calcium sources   250mg   Ca supplement 0 = 0   Estimated calcium intake per day 550    DEXA Results FINDINGS: LEFT FEMUR neck  Bone Mineral Density (BMD):  0.611 g/cm2  Young Adult T-Score: -3.1  Z-Score:  -0.4  LEFT FOREARM (1/3 RADIUS)  Bone Mineral Density (BMD):  0.506  Young Adult T Score:  -4.3  Z Score:  -0.8  Unit: This study was performed at [facility] on a [place].  Scan quality: The scan quality is good. Exclusions: Lumbar spine is excluded because of degenerative changes.  ASSESSMENT: Patient's diagnostic category is OSTEOPOROSIS by WHO Criteria.  FRACTURE RISK: INCREASED  FRAX: World Health Organization FRAX assessment of absolute fracture risk is not calculated for this patient because the patient has T-score at or below -2.5.  COMPARISON: 11/20/2008  Total mean hip: Since the baseline exam, bone mineral density has decreased by  28%.  Recommendations: 1.  Start  alendronate (FOSAMAX)  2.  recommend calcium 1200mg  daily through supplementation or diet.  3.  continue weight bearing exercise - 30 minutes at least 4 days per week.   4.  Counseled and educated about fall risk and prevention.  Recheck DEXA:  2 years  Time spent counseling patient:  20 minutes  Regina Eck, PharmD, BCPS Clinical Pharmacist, [place]  [facility] Phone [phone]

## 2021-05-05 ENCOUNTER — Other Ambulatory Visit: Payer: Medicare Other

## 2021-05-18 ENCOUNTER — Other Ambulatory Visit: Payer: Self-pay | Admitting: Family Medicine

## 2021-05-18 ENCOUNTER — Other Ambulatory Visit: Payer: Self-pay

## 2021-05-18 ENCOUNTER — Encounter: Payer: Self-pay | Admitting: Family Medicine

## 2021-05-18 ENCOUNTER — Ambulatory Visit (INDEPENDENT_AMBULATORY_CARE_PROVIDER_SITE_OTHER): Payer: Medicare Other | Admitting: Family Medicine

## 2021-05-18 VITALS — BP 135/58 | HR 56 | Temp 97.7°F | Ht 61.0 in | Wt 133.0 lb

## 2021-05-18 DIAGNOSIS — R609 Edema, unspecified: Secondary | ICD-10-CM

## 2021-05-18 DIAGNOSIS — N3001 Acute cystitis with hematuria: Secondary | ICD-10-CM | POA: Diagnosis not present

## 2021-05-18 DIAGNOSIS — R399 Unspecified symptoms and signs involving the genitourinary system: Secondary | ICD-10-CM

## 2021-05-18 LAB — URINALYSIS, ROUTINE W REFLEX MICROSCOPIC
Bilirubin, UA: NEGATIVE
Glucose, UA: NEGATIVE
Ketones, UA: NEGATIVE
Nitrite, UA: NEGATIVE
Protein,UA: NEGATIVE
Specific Gravity, UA: 1.015 (ref 1.005–1.030)
Urobilinogen, Ur: 1 mg/dL (ref 0.2–1.0)
pH, UA: 7 (ref 5.0–7.5)

## 2021-05-18 LAB — MICROSCOPIC EXAMINATION

## 2021-05-18 MED ORDER — FUROSEMIDE 40 MG PO TABS
40.0000 mg | ORAL_TABLET | Freq: Every day | ORAL | 0 refills | Status: DC
Start: 2021-05-18 — End: 2021-05-29

## 2021-05-18 MED ORDER — CEPHALEXIN 500 MG PO CAPS
500.0000 mg | ORAL_CAPSULE | Freq: Two times a day (BID) | ORAL | 0 refills | Status: DC
Start: 2021-05-18 — End: 2021-05-29

## 2021-05-18 NOTE — Patient Instructions (Signed)
Peripheral Edema  Peripheral edema is swelling that is caused by a buildup of fluid. Peripheral edema most often affects the lower legs, ankles, and feet. It can also develop in the arms, hands, and face. The area of the body that has peripheral edema will look swollen. It may also feel heavy or warm. Your clothes may start to feel tight. Pressing on the area may make a temporary dent in your skin. You may not be able to move your swollen arm or leg as much as usual. There are many causes of peripheral edema. It can happen because of a complication of other conditions such as congestive heart failure, kidney disease, or a problem with your blood circulation. It also can be a side effect of certain medicines or because of an infection. It often happens to women during pregnancy. Sometimes, the cause is not known. Follow these instructions at home: Managing pain, stiffness, and swelling  Raise (elevate) your legs while you are sitting or lying down.  Move around often to prevent stiffness and to lessen swelling.  Do not sit or stand for long periods of time.  Wear support stockings as told by your health care provider.   Medicines  Take over-the-counter and prescription medicines only as told by your health care provider.  Your health care provider may prescribe medicine to help your body get rid of excess water (diuretic). General instructions  Pay attention to any changes in your symptoms.  Follow instructions from your health care provider about limiting salt (sodium) in your diet. Sometimes, eating less salt may reduce swelling.  Moisturize skin daily to help prevent skin from cracking and draining.  Keep all follow-up visits as told by your health care provider. This is important. Contact a health care provider if you have:  A fever.  Edema that starts suddenly or is getting worse, especially if you are pregnant or have a medical condition.  Swelling in only one leg.  Increased  swelling, redness, or pain in one or both of your legs.  Drainage or sores at the area where you have edema. Get help right away if you:  Develop shortness of breath, especially when you are lying down.  Have pain in your chest or abdomen.  Feel weak.  Feel faint. Summary  Peripheral edema is swelling that is caused by a buildup of fluid. Peripheral edema most often affects the lower legs, ankles, and feet.  Move around often to prevent stiffness and to lessen swelling. Do not sit or stand for long periods of time.  Pay attention to any changes in your symptoms.  Contact a health care provider if you have edema that starts suddenly or is getting worse, especially if you are pregnant or have a medical condition.  Get help right away if you develop shortness of breath, especially when lying down. This information is not intended to replace advice given to you by your health care provider. Make sure you discuss any questions you have with your health care provider. Document Revised: 09/06/2018 Document Reviewed: 09/06/2018 Elsevier Patient Education  2021 [employer].

## 2021-05-18 NOTE — Progress Notes (Signed)
Acute Office Visit  Subjective:    Patient ID: Erica [name], female    DOB: 05/07/1930, 87 y.o.   MRN: [id number]  Chief Complaint  Patient presents with  . Edema    HPI Patient is in today for edema in her feet x1 month. The swelling resolves over night. She tries to prop her feet up when she sits. She saw her cardiologist 3 weeks ago and told him, he told her that the swelling was not due to heart failure. She denies chest pain, shortness of breath, or cough. She takes lasix 20 mg.  She also sees urology for blood in her urine that has been present for years. She thinks that she has had more blood than usual lately. She reports some hesitancy for some time as well and intermittent lower abdominal pressure. She denies dysuria, fever, chills, or frequency.   Past Medical History:  Diagnosis Date  . Abnormal transaminases 05/09/2019  . Abnormality of gait 08/25/2016  . Adenomatous rectal polyp   . Anxiety about health 05/09/2019  . Arthritis 04/03/2018   L hip  . Atrial fibrillation ([place])   . Bleeding internal hemorrhoids   . Cataract   . Chronic back pain   . Depression   . Diverticulosis of colon (without mention of hemorrhage)   . Gastroparesis   . GERD (gastroesophageal reflux disease)   . Hiatal hernia 09/1999   3cm  . Hyperlipidemia   . Hypertension   . Hypothyroid   . Ischemic colitis ([place])   . Left foot drop 08/25/2016  . Lichen sclerosus et atrophicus of the vulva   . MVP (mitral valve prolapse)   . Osteoporosis   . Sleep apnea   . TIA (transient ischemic attack)   . Weakness     Past Surgical History:  Procedure Laterality Date  . CARDIAC CATHETERIZATION N/A 02/03/2016   Procedure: Coronary/Graft Angiography;  Surgeon: Charolette Forward, MD;  Location: [facility];  Service: Cardiovascular;  Laterality: N/A;  . CATARACT EXTRACTION    . CHOLECYSTECTOMY    . COLONOSCOPY     Multiple, polyps  . ESOPHAGEAL MANOMETRY N/A 06/14/2016   Procedure: ESOPHAGEAL  MANOMETRY (EM);  Surgeon: Gatha Mayer, MD;  Location: [facility];  Service: Endoscopy;  Laterality: N/A;  . LEFT HEART CATHETERIZATION WITH CORONARY ANGIOGRAM N/A 07/17/2013   Procedure: LEFT HEART CATHETERIZATION WITH CORONARY ANGIOGRAM;  Surgeon: Clent Demark, MD;  Location: [facility];  Service: Cardiovascular;  Laterality: N/A;  . SIGMOIDOSCOPY  08/23/2011  . TUBAL LIGATION    . UPPER GASTROINTESTINAL ENDOSCOPY  08/23/2011   Normal    Family History  Problem Relation Age of Onset  . Stomach cancer Brother   . Colon cancer Paternal Aunt   . Lung cancer Brother        smoker  . Esophageal cancer Father   . Stroke Mother     Social History   Socioeconomic History  . Marital status: Widowed    Spouse name: Not on file  . Number of children: 76  . Years of education: Not on file  . Highest education level: Not on file  Occupational History  . Occupation: retired    Fish farm manager: RETIRED  Tobacco Use  . Smoking status: Never Smoker  . Smokeless tobacco: Never Used  Vaping Use  . Vaping Use: Never used  Substance and Sexual Activity  . Alcohol use: No  . Drug use: No  . Sexual activity: Not on file  Other Topics Concern  . Not on file  Social History Narrative   Lives at home alone.   Right-handed.   2 cups caffeine most days.   Social Determinants of Health   Financial Resource Strain: Not on file  Food Insecurity: Not on file  Transportation Needs: Not on file  Physical Activity: Not on file  Stress: Not on file  Social Connections: Not on file  Intimate Partner Violence: Not on file    Outpatient Medications Prior to Visit  Medication Sig Dispense Refill  . alendronate (FOSAMAX) 70 MG tablet Take 1 tablet (70 mg total) by mouth every 7 (seven) days. Take with a full glass of water on an empty stomach. 4 tablet 11  . amiodarone (PACERONE) 200 MG tablet Take 1 tablet (200 mg total) by mouth daily. 90 tablet 1  . amLODipine (NORVASC) 5 MG tablet TAKE ONE  TABLET BY MOUTH TWICE DAILY (MUST BE SEEN BEFORE NEXT REFILL) 180 tablet 1  . apixaban (ELIQUIS) 2.5 MG TABS tablet TAKE (1) TABLET TWICE A DAY. 60 tablet 5  . atorvastatin (LIPITOR) 10 MG tablet TAKE 1 TABLET ONCE DAILY AT 6PM 90 tablet 1  . Cholecalciferol (VITAMIN D3) 125 MCG (5000 UT) CAPS Take 1 capsule by mouth every other day.    . escitalopram (LEXAPRO) 20 MG tablet Take 2 tablets (40 mg total) by mouth daily. 180 tablet 1  . esomeprazole (NEXIUM) 40 MG capsule TAKE 1 CAPSULE TWICE DAILY BEFORE MEALS 180 capsule 1  . furosemide (LASIX) 20 MG tablet Take 1 tablet (20 mg total) by mouth daily. 90 tablet 1  . hydrocortisone (ANUSOL-HC) 2.5 % rectal cream Place 1 application rectally 2 (two) times daily. (Patient taking differently: Place 1 application rectally 2 (two) times daily as needed for hemorrhoids or anal itching.) 30 g 1  . levothyroxine (SYNTHROID) 88 MCG tablet TAKE (1) TABLET DAILY BE- FORE BREAKFAST. 90 tablet 1  . Melatonin 5 MG CAPS Take 5 mg by mouth at bedtime as needed (sleep).    . Multiple Vitamin (MULTIVITAMIN WITH MINERALS) TABS tablet Take 1 tablet by mouth daily.    Marland Kitchen olmesartan (BENICAR) 40 MG tablet TAKE 1 TABLET DAILY 90 tablet 0  . Wheat Dextrin (BENEFIBER) POWD Take 1 Dose by mouth daily. Take daily     No facility-administered medications prior to visit.    Allergies  Allergen Reactions  . Contrast Media [Iodinated Diagnostic Agents] Shortness Of Breath, Nausea Only and Swelling  . Iodine Shortness Of Breath, Nausea Only and Swelling  . Iohexol Hives and Swelling     Desc: hives, swelling over 5 yrs ago-pt has never been pre-medicated--was told to not have iv contrast ever   . Ioxaglate Nausea Only, Shortness Of Breath and Swelling  . Metoclopramide Other (See Comments)    Mouth tremors, insomnia, and irritability    Review of Systems As per HPI.     Objective:    Physical Exam Vitals and nursing note reviewed.  Constitutional:      General: She  is not in acute distress.    Appearance: She is not ill-appearing, toxic-appearing or diaphoretic.  Cardiovascular:     Rate and Rhythm: Normal rate and regular rhythm.     Heart sounds: Normal heart sounds. No murmur heard.   Pulmonary:     Effort: Pulmonary effort is normal. No respiratory distress.     Breath sounds: Normal breath sounds. No wheezing, rhonchi or rales.  Chest:     Chest wall:  No tenderness.  Abdominal:     General: Bowel sounds are normal. There is no distension.     Palpations: Abdomen is soft.     Tenderness: There is no abdominal tenderness. There is no right CVA tenderness, left CVA tenderness, guarding or rebound.  Musculoskeletal:     Right lower leg: 2+ Pitting Edema present.     Left lower leg: 1+ Pitting Edema present.  Skin:    General: Skin is warm and dry.  Neurological:     Mental Status: She is alert and oriented to person, place, and time.  Psychiatric:        Mood and Affect: Mood normal.        Behavior: Behavior normal.     BP (!) 135/58   Pulse (!) 56   Temp 97.7 F (36.5 C) (Temporal)   Ht 5\' 1"  (1.549 m)   Wt 133 lb (60.3 kg)   BMI 25.13 kg/m  Wt Readings from Last 3 Encounters:  05/18/21 133 lb (60.3 kg)  02/19/21 130 lb (59 kg)  06/03/20 129 lb (58.5 kg)    Health Maintenance Due  Topic Date Due  . COLONOSCOPY (Pts 45-50yrs Insurance coverage will need to be confirmed)  07/01/2017  . MAMMOGRAM  06/10/2020  . COVID-19 Vaccine (3 - Booster for Moderna series) 04/18/2021    There are no preventive care reminders to display for this patient.   Lab Results  Component Value Date   TSH 0.590 02/19/2021   Lab Results  Component Value Date   WBC 9.3 02/19/2021   HGB 13.1 02/19/2021   HCT 39.3 02/19/2021   MCV 92 02/19/2021   PLT 210 02/19/2021   Lab Results  Component Value Date   NA 137 02/19/2021   K 4.6 02/19/2021   CO2 22 02/19/2021   GLUCOSE 97 02/19/2021   BUN 15 02/19/2021   CREATININE 0.85 02/19/2021    BILITOT 0.4 02/19/2021   ALKPHOS 63 02/19/2021   AST 27 02/19/2021   ALT 25 02/19/2021   PROT 6.2 02/19/2021   ALBUMIN 4.1 02/19/2021   CALCIUM 9.4 02/19/2021   ANIONGAP 12 05/17/2020   GFR 53.05 (L) 06/06/2012   Lab Results  Component Value Date   CHOL 148 02/19/2021   Lab Results  Component Value Date   HDL 60 02/19/2021   Lab Results  Component Value Date   LDLCALC 71 02/19/2021   Lab Results  Component Value Date   TRIG 91 02/19/2021   Lab Results  Component Value Date   CHOLHDL 2.5 02/19/2021   No results found for: HGBA1C     Assessment & Plan:   Erica [name] was seen today for edema.  Diagnoses and all orders for this visit:  Peripheral edema Chronic with recent worsening. Has seen cardiology. Increase Lasix to 40 mg x 5 days, then resume 20 mg dosage. Discussed elevation, low salt diet. Reassured patient.  -     furosemide (LASIX) 40 MG tablet; Take 1 tablet (40 mg total) by mouth daily for 5 days.  UTI symptoms She will leave urine specimen on the way out today. Will notify patient of results.  -     Urinalysis, Routine w reflex microscopic  Return to office for new or worsening symptoms, or if symptoms persist.   The patient indicates understanding of these issues and agrees with the plan.  Gwenlyn Perking, [name]

## 2021-05-20 LAB — URINE CULTURE

## 2021-05-29 ENCOUNTER — Other Ambulatory Visit: Payer: Self-pay

## 2021-05-29 ENCOUNTER — Ambulatory Visit (INDEPENDENT_AMBULATORY_CARE_PROVIDER_SITE_OTHER): Payer: Medicare Other | Admitting: Nurse Practitioner

## 2021-05-29 ENCOUNTER — Encounter: Payer: Self-pay | Admitting: Nurse Practitioner

## 2021-05-29 VITALS — BP 128/63 | HR 53 | Temp 96.5°F | Resp 20 | Ht 61.0 in | Wt 130.0 lb

## 2021-05-29 DIAGNOSIS — K3184 Gastroparesis: Secondary | ICD-10-CM | POA: Diagnosis not present

## 2021-05-29 DIAGNOSIS — M810 Age-related osteoporosis without current pathological fracture: Secondary | ICD-10-CM

## 2021-05-29 DIAGNOSIS — R2681 Unsteadiness on feet: Secondary | ICD-10-CM

## 2021-05-29 DIAGNOSIS — F418 Other specified anxiety disorders: Secondary | ICD-10-CM | POA: Diagnosis not present

## 2021-05-29 DIAGNOSIS — N3001 Acute cystitis with hematuria: Secondary | ICD-10-CM | POA: Diagnosis not present

## 2021-05-29 DIAGNOSIS — I2510 Atherosclerotic heart disease of native coronary artery without angina pectoris: Secondary | ICD-10-CM

## 2021-05-29 DIAGNOSIS — K219 Gastro-esophageal reflux disease without esophagitis: Secondary | ICD-10-CM

## 2021-05-29 DIAGNOSIS — E782 Mixed hyperlipidemia: Secondary | ICD-10-CM

## 2021-05-29 DIAGNOSIS — F3341 Major depressive disorder, recurrent, in partial remission: Secondary | ICD-10-CM

## 2021-05-29 DIAGNOSIS — R609 Edema, unspecified: Secondary | ICD-10-CM

## 2021-05-29 DIAGNOSIS — I10 Essential (primary) hypertension: Secondary | ICD-10-CM | POA: Diagnosis not present

## 2021-05-29 DIAGNOSIS — E039 Hypothyroidism, unspecified: Secondary | ICD-10-CM | POA: Diagnosis not present

## 2021-05-29 MED ORDER — APIXABAN 2.5 MG PO TABS: ORAL_TABLET | ORAL | 5 refills | Status: DC

## 2021-05-29 MED ORDER — OLMESARTAN MEDOXOMIL 40 MG PO TABS
40.0000 mg | ORAL_TABLET | Freq: Every day | ORAL | 1 refills | Status: DC
Start: 2021-05-29 — End: 2021-11-30

## 2021-05-29 MED ORDER — ESOMEPRAZOLE MAGNESIUM 40 MG PO CPDR: DELAYED_RELEASE_CAPSULE | ORAL | 1 refills | Status: DC

## 2021-05-29 MED ORDER — ESCITALOPRAM OXALATE 20 MG PO TABS: 40.0000 mg | ORAL_TABLET | Freq: Every day | ORAL | 1 refills | Status: DC

## 2021-05-29 MED ORDER — AMLODIPINE BESYLATE 5 MG PO TABS
ORAL_TABLET | ORAL | 1 refills | Status: DC
Start: 2021-05-29 — End: 2021-11-20

## 2021-05-29 MED ORDER — ATORVASTATIN CALCIUM 10 MG PO TABS: ORAL_TABLET | ORAL | 1 refills | Status: DC

## 2021-05-29 MED ORDER — LEVOTHYROXINE SODIUM 88 MCG PO TABS: ORAL_TABLET | ORAL | 1 refills | Status: DC

## 2021-05-29 MED ORDER — FUROSEMIDE 40 MG PO TABS
40.0000 mg | ORAL_TABLET | Freq: Every day | ORAL | 2 refills | Status: DC
Start: 2021-05-29 — End: 2021-10-26

## 2021-05-29 MED ORDER — AMIODARONE HCL 200 MG PO TABS: 200.0000 mg | ORAL_TABLET | Freq: Every day | ORAL | 1 refills | Status: DC

## 2021-05-29 MED ORDER — FUROSEMIDE 20 MG PO TABS
20.0000 mg | ORAL_TABLET | Freq: Every day | ORAL | 1 refills | Status: DC
Start: 2021-05-29 — End: 2021-11-20

## 2021-05-29 NOTE — Patient Instructions (Signed)
Fall Prevention in the Home, Adult Falls can cause injuries and can happen to people of all ages. There are many things you can do to make your home safe and to help prevent falls. Ask for help when making these changes. What actions can I take to prevent falls? General Instructions  Use good lighting in all rooms. Replace any light bulbs that burn out.  Turn on the lights in dark areas. Use night-lights.  Keep items that you use often in easy-to-reach places. Lower the shelves around your home if needed.  Set up your furniture so you have a clear path. Avoid moving your furniture around.  Do not have throw rugs or other things on the floor that can make you trip.  Avoid walking on wet floors.  If any of your floors are uneven, fix them.  Add color or contrast paint or tape to clearly mark and help you see: ? Grab bars or handrails. ? First and last steps of staircases. ? Where the edge of each step is.  If you use a stepladder: ? Make sure that it is fully opened. Do not climb a closed stepladder. ? Make sure the sides of the stepladder are locked in place. ? Ask someone to hold the stepladder while you use it.  Know where your pets are when moving through your home. What can I do in the bathroom?  Keep the floor dry. Clean up any water on the floor right away.  Remove soap buildup in the tub or shower.  Use nonskid mats or decals on the floor of the tub or shower.  Attach bath mats securely with double-sided, nonslip rug tape.  If you need to sit down in the shower, use a plastic, nonslip stool.  Install grab bars by the toilet and in the tub and shower. Do not use towel bars as grab bars.      What can I do in the bedroom?  Make sure that you have a light by your bed that is easy to reach.  Do not use any sheets or blankets for your bed that hang to the floor.  Have a firm chair with side arms that you can use for support when you get dressed. What can I do in  the kitchen?  Clean up any spills right away.  If you need to reach something above you, use a step stool with a grab bar.  Keep electrical cords out of the way.  Do not use floor polish or wax that makes floors slippery. What can I do with my stairs?  Do not leave any items on the stairs.  Make sure that you have a light switch at the top and the bottom of the stairs.  Make sure that there are handrails on both sides of the stairs. Fix handrails that are broken or loose.  Install nonslip stair treads on all your stairs.  Avoid having throw rugs at the top or bottom of the stairs.  Choose a carpet that does not hide the edge of the steps on the stairs.  Check carpeting to make sure that it is firmly attached to the stairs. Fix carpet that is loose or worn. What can I do on the outside of my home?  Use bright outdoor lighting.  Fix the edges of walkways and driveways and fix any cracks.  Remove anything that might make you trip as you walk through a door, such as a raised step or threshold.  Trim any   bushes or trees on paths to your home.  Check to see if handrails are loose or broken and that both sides of all steps have handrails.  Install guardrails along the edges of any raised decks and porches.  Clear paths of anything that can make you trip, such as tools or rocks.  Have leaves, snow, or ice cleared regularly.  Use sand or salt on paths during winter.  Clean up any spills in your garage right away. This includes grease or oil spills. What other actions can I take?  Wear shoes that: ? Have a low heel. Do not wear high heels. ? Have rubber bottoms. ? Feel good on your feet and fit well. ? Are closed at the toe. Do not wear open-toe sandals.  Use tools that help you move around if needed. These include: ? Canes. ? Walkers. ? Scooters. ? Crutches.  Review your medicines with your doctor. Some medicines can make you feel dizzy. This can increase your chance  of falling. Ask your doctor what else you can do to help prevent falls. Where to find more information  Centers for Disease Control and Prevention, [employer]: www.cdc.gov  National Institute on Aging: www.nia.nih.gov Contact a doctor if:  You are afraid of falling at home.  You feel weak, drowsy, or dizzy at home.  You fall at home. Summary  There are many simple things that you can do to make your home safe and to help prevent falls.  Ways to make your home safe include removing things that can make you trip and installing grab bars in the bathroom.  Ask for help when making these changes in your home. This information is not intended to replace advice given to you by your health care provider. Make sure you discuss any questions you have with your health care provider. Document Revised: 07/16/2020 Document Reviewed: 07/16/2020 Elsevier Patient Education  2021 Elsevier Inc.  

## 2021-05-29 NOTE — Progress Notes (Signed)
Subjective:    Patient ID: Erica [name], female    DOB: 1930/08/28, 87 y.o.   MRN: [id number]   Chief Complaint: Medical Management of Chronic Issues    HPI:  1. Primary hypertension No c/o chest pain, sob or headache. Does not check blood pressure at home. BP Readings from Last 3 Encounters:  05/29/21 128/63  05/18/21 (!) 135/58  02/19/21 137/62     2. Gastroparesis Has lots of stomach issues. Has a very poor appetite  3. Gastroesophageal reflux disease without esophagitis nexium daily and works well.  4. Acquired hypothyroidism No problems that she is aware of.   5. Age-related osteoporosis without current pathological fracture Last dexascan was done on 04/07/21 with t score of -4.3. she is on foxamax weekly  6. Anxiety about health Worries all the time about her health GAD 7 : Generalized Anxiety Score 05/18/2021 02/25/2020  Nervous, Anxious, on Edge 0 1  Control/stop worrying 0 1  Worry too much - different things 0 1  Trouble relaxing 0 0  Restless 0 0  Easily annoyed or irritable 0 0  Afraid - awful might happen 0 0  Total GAD 7 Score 0 3  Anxiety Difficulty Not difficult at all -      7. Recurrent major depressive disorder, in partial remission [facility] She is on lexapro an dis doing well Depression screen [facility] 2/9 05/29/2021 05/18/2021 02/19/2021  Decreased Interest 0 0 0  Down, Depressed, Hopeless 0 0 0  PHQ - 2 Score 0 0 0  Altered sleeping - 0 0  Tired, decreased energy - 1 0  Change in appetite - 0 0  Feeling bad or failure about yourself  - 0 0  Trouble concentrating - 0 0  Moving slowly or fidgety/restless - 0 0  Suicidal thoughts - 0 0  PHQ-9 Score - 1 0  Difficult doing work/chores - Not difficult at all -  Some recent data might be hidden    8. Mixed hyperlipidemia Does not watch diet. Is not able to do any exercis ebecause she is afraid she will fall.    Outpatient Encounter Medications as of 05/29/2021  Medication Sig  . alendronate  (FOSAMAX) 70 MG tablet Take 1 tablet (70 mg total) by mouth every 7 (seven) days. Take with a full glass of water on an empty stomach.  Marland Kitchen amiodarone (PACERONE) 200 MG tablet Take 1 tablet (200 mg total) by mouth daily.  Marland Kitchen amLODipine (NORVASC) 5 MG tablet TAKE ONE TABLET BY MOUTH TWICE DAILY (MUST BE SEEN BEFORE NEXT REFILL)  . apixaban (ELIQUIS) 2.5 MG TABS tablet TAKE (1) TABLET TWICE A DAY.  Marland Kitchen atorvastatin (LIPITOR) 10 MG tablet TAKE 1 TABLET ONCE DAILY AT 6PM  . cephALEXin (KEFLEX) 500 MG capsule Take 1 capsule (500 mg total) by mouth 2 (two) times daily.  . Cholecalciferol (VITAMIN D3) 125 MCG (5000 UT) CAPS Take 1 capsule by mouth every other day.  . escitalopram (LEXAPRO) 20 MG tablet Take 2 tablets (40 mg total) by mouth daily.  Marland Kitchen esomeprazole (NEXIUM) 40 MG capsule TAKE 1 CAPSULE TWICE DAILY BEFORE MEALS  . furosemide (LASIX) 20 MG tablet Take 1 tablet (20 mg total) by mouth daily.  . furosemide (LASIX) 40 MG tablet Take 1 tablet (40 mg total) by mouth daily for 5 days.  . hydrocortisone (ANUSOL-HC) 2.5 % rectal cream Place 1 application rectally 2 (two) times daily. (Patient taking differently: Place 1 application rectally 2 (two) times daily as needed for  hemorrhoids or anal itching.)  . levothyroxine (SYNTHROID) 88 MCG tablet TAKE (1) TABLET DAILY BE- FORE BREAKFAST.  . Melatonin 5 MG CAPS Take 5 mg by mouth at bedtime as needed (sleep).  . Multiple Vitamin (MULTIVITAMIN WITH MINERALS) TABS tablet Take 1 tablet by mouth daily.  . olmesartan (BENICAR) 40 MG tablet TAKE 1 TABLET DAILY  . Wheat Dextrin (BENEFIBER) POWD Take 1 Dose by mouth daily. Take daily   No facility-administered encounter medications on file as of 05/29/2021.    Past Surgical History:  Procedure Laterality Date  . CARDIAC CATHETERIZATION N/A 02/03/2016   Procedure: Coronary/Graft Angiography;  Surgeon: Mohan Harwani, MD;  Location: [facility]IVE [facility];  Service: Cardiovascular;  Laterality: N/A;  . CATARACT  EXTRACTION    . CHOLECYSTECTOMY    . COLONOSCOPY     Multiple, polyps  . ESOPHAGEAL MANOMETRY N/A 06/14/2016   Procedure: ESOPHAGEAL MANOMETRY (EM);  Surgeon: Carl E Gessner, MD;  Location: [facility] ENDOSCOPY;  Service: Endoscopy;  Laterality: N/A;  . LEFT HEART CATHETERIZATION WITH CORONARY ANGIOGRAM N/A 07/17/2013   Procedure: LEFT HEART CATHETERIZATION WITH CORONARY ANGIOGRAM;  Surgeon: Mohan N Harwani, MD;  Location: [facility] CATH LAB;  Service: Cardiovascular;  Laterality: N/A;  . SIGMOIDOSCOPY  08/23/2011  . TUBAL LIGATION    . UPPER GASTROINTESTINAL ENDOSCOPY  08/23/2011   Normal    Family History  Problem Relation Age of Onset  . Stomach cancer Brother   . Colon cancer Paternal Aunt   . Lung cancer Brother        smoker  . Esophageal cancer Father   . Stroke Mother     New complaints: Has a fall several weeks ago. Did not break anything. laned on her left hip.  Social history: Lives by herself. Her children check on her daily.  Controlled substance contract: n/a    Review of Systems  Constitutional: Negative.   HENT: Negative.   Respiratory: Negative.   Cardiovascular: Negative.   Genitourinary: Negative.   Neurological: Negative.   Psychiatric/Behavioral: Negative.   All other systems reviewed and are negative.      Objective:   Physical Exam Vitals and nursing note reviewed.  Constitutional:      General: She is not in acute distress.    Appearance: Normal appearance. She is well-developed.  HENT:     Head: Normocephalic.     Nose: Nose normal.  Eyes:     Pupils: Pupils are equal, round, and reactive to light.  Neck:     Vascular: No carotid bruit or JVD.  Cardiovascular:     Rate and Rhythm: Normal rate and regular rhythm.     Heart sounds: Murmur (2/6) heard.    Pulmonary:     Effort: Pulmonary effort is normal. No respiratory distress.     Breath sounds: Normal breath sounds. No wheezing or rales.  Chest:     Chest wall: No tenderness.  Abdominal:      General: Bowel sounds are normal. There is no distension or abdominal bruit.     Palpations: Abdomen is soft. There is no hepatomegaly, splenomegaly, mass or pulsatile mass.     Tenderness: There is no abdominal tenderness.  Musculoskeletal:        General: Normal range of motion.     Cervical back: Normal range of motion and neck supple.     Comments: Gait slow and steady  Lymphadenopathy:     Cervical: No cervical adenopathy.  Skin:    General: Skin is warm and   dry.     Comments: 12cm contusion on left hip  Neurological:     Mental Status: She is alert and oriented to person, place, and time.     Deep Tendon Reflexes: Reflexes are normal and symmetric.  Psychiatric:        Behavior: Behavior normal.        Thought Content: Thought content normal.        Judgment: Judgment normal.    BP 128/63   Pulse (!) 53   Temp (!) 96.5 F (35.8 C) (Temporal)   Resp 20   Ht 5' 1" (1.549 m)   Wt 130 lb (59 kg)   SpO2 96%   BMI 24.56 kg/m         Assessment & Plan:  Joelynn S Monie comes in today with chief complaint of Medical Management of Chronic Issues   Diagnosis and orders addressed:  1. Primary hypertension Low sodium diet - furosemide (LASIX) 20 MG tablet; Take 1 tablet (20 mg total) by mouth daily.  Dispense: 90 tablet; Refill: 1 - olmesartan (BENICAR) 40 MG tablet; Take 1 tablet (40 mg total) by mouth daily.  Dispense: 90 tablet; Refill: 1 - amLODipine (NORVASC) 5 MG tablet; TAKE ONE TABLET BY MOUTH TWICE DAILY (MUST BE SEEN BEFORE NEXT REFILL)  Dispense: 180 tablet; Refill: 1 - CBC with Differential/Platelet - CMP14+EGFR  2. Gastroparesis  3. Gastroesophageal reflux disease without esophagitis Avoid spicy foods Do not eat 2 hours prior to bedtime - esomeprazole (NEXIUM) 40 MG capsule; TAKE 1 CAPSULE TWICE DAILY BEFORE MEALS  Dispense: 180 capsule; Refill: 1  4. Acquired hypothyroidism Labs pending - levothyroxine (SYNTHROID) 88 MCG tablet; TAKE (1) TABLET  DAILY BE- FORE BREAKFAST.  Dispense: 90 tablet; Refill: 1 - Thyroid Panel With TSH  5. Age-related osteoporosis without current pathological fracture Weight bearing exercise  6. Anxiety about health Stress management  7. Recurrent major depressive disorder, in partial remission (HCC) - escitalopram (LEXAPRO) 20 MG tablet; Take 2 tablets (40 mg total) by mouth daily.  Dispense: 180 tablet; Refill: 1  8. Mixed hyperlipidemia Low fat diet - atorvastatin (LIPITOR) 10 MG tablet; TAKE 1 TABLET ONCE DAILY AT 6PM  Dispense: 90 tablet; Refill: 1 - Lipid panel  9. Acute cystitis with hematuria Urine clear  10. Coronary artery disease involving native coronary artery of native heart without angina pectoris Keep follow up with cardiology - amiodarone (PACERONE) 200 MG tablet; Take 1 tablet (200 mg total) by mouth daily.  Dispense: 90 tablet; Refill: 1 - apixaban (ELIQUIS) 2.5 MG TABS tablet; TAKE (1) TABLET TWICE A DAY.  Dispense: 60 tablet; Refill: 5  11. Peripheral edema Use lasix on prn basis elevate legs when sitting Compression socks - furosemide (LASIX) 40 MG tablet; Take 1 tablet (40 mg total) by mouth daily for 5 days.  Dispense: 30 tablet; Refill: 2  12. Unsteady gait Fall prevention - DME Walker platform   Labs pending Health Maintenance reviewed Diet and exercise encouraged  Follow up plan: 6 months   Mary-Margaret Martin, [name]  

## 2021-05-30 LAB — CMP14+EGFR
ALT: 32 IU/L (ref 0–32)
AST: 34 IU/L (ref 0–40)
Albumin/Globulin Ratio: 1.6 (ref 1.2–2.2)
Albumin: 3.9 g/dL (ref 3.6–4.6)
Alkaline Phosphatase: 71 IU/L (ref 44–121)
BUN/Creatinine Ratio: 17 (ref 12–28)
BUN: 14 mg/dL (ref 8–27)
Bilirubin Total: 0.3 mg/dL (ref 0.0–1.2)
CO2: 21 mmol/L (ref 20–29)
Calcium: 9.2 mg/dL (ref 8.7–10.3)
Chloride: 98 mmol/L (ref 96–106)
Creatinine, Ser: 0.82 mg/dL (ref 0.57–1.00)
Globulin, Total: 2.5 g/dL (ref 1.5–4.5)
Glucose: 94 mg/dL (ref 65–99)
Potassium: 4.6 mmol/L (ref 3.5–5.2)
Sodium: 137 mmol/L (ref 134–144)
Total Protein: 6.4 g/dL (ref 6.0–8.5)
eGFR: 69 mL/min/{1.73_m2} (ref >59)

## 2021-05-30 LAB — CBC WITH DIFFERENTIAL/PLATELET
Basophils Absolute: 0.1 10*3/uL (ref 0.0–0.2)
Basos: 1 %
EOS (ABSOLUTE): 0.1 10*3/uL (ref 0.0–0.4)
Eos: 1 %
Hematocrit: 39.5 % (ref 34.0–46.6)
Hemoglobin: 12.7 g/dL (ref 11.1–15.9)
Immature Grans (Abs): 0 10*3/uL (ref 0.0–0.1)
Immature Granulocytes: 0 %
Lymphocytes Absolute: 3.1 10*3/uL (ref 0.7–3.1)
Lymphs: 29 %
MCH: 30.2 pg (ref 26.6–33.0)
MCHC: 32.2 g/dL (ref 31.5–35.7)
MCV: 94 fL (ref 79–97)
Monocytes Absolute: 1 10*3/uL — ABNORMAL HIGH (ref 0.1–0.9)
Monocytes: 10 %
Neutrophils Absolute: 6.2 10*3/uL (ref 1.4–7.0)
Neutrophils: 59 %
Platelets: 230 10*3/uL (ref 150–450)
RBC: 4.21 x10E6/uL (ref 3.77–5.28)
RDW: 13.1 % (ref 11.7–15.4)
WBC: 10.6 10*3/uL (ref 3.4–10.8)

## 2021-05-30 LAB — LIPID PANEL
Chol/HDL Ratio: 3.2 ratio (ref 0.0–4.4)
Cholesterol, Total: 175 mg/dL (ref 100–199)
HDL: 54 mg/dL (ref >39)
LDL Chol Calc (NIH): 99 mg/dL (ref 0–99)
Triglycerides: 126 mg/dL (ref 0–149)
VLDL Cholesterol Cal: 22 mg/dL (ref 5–40)

## 2021-05-30 LAB — THYROID PANEL WITH TSH
Free Thyroxine Index: 3.4 (ref 1.2–4.9)
T3 Uptake Ratio: 33 % (ref 24–39)
T4, Total: 10.3 ug/dL (ref 4.5–12.0)
TSH: 0.62 u[IU]/mL (ref 0.450–4.500)

## 2021-06-02 ENCOUNTER — Telehealth: Payer: Self-pay | Admitting: Nurse Practitioner

## 2021-06-02 DIAGNOSIS — R2681 Unsteadiness on feet: Secondary | ICD-10-CM

## 2021-06-04 NOTE — Telephone Encounter (Signed)
Rx written and sent to [facility] per patients request

## 2021-06-16 DIAGNOSIS — S7002XA Contusion of left hip, initial encounter: Secondary | ICD-10-CM | POA: Diagnosis not present

## 2021-06-16 DIAGNOSIS — Z6824 Body mass index (BMI) 24.0-24.9, adult: Secondary | ICD-10-CM | POA: Diagnosis not present

## 2021-06-16 DIAGNOSIS — T148XXA Other injury of unspecified body region, initial encounter: Secondary | ICD-10-CM | POA: Diagnosis not present

## 2021-07-23 DIAGNOSIS — F419 Anxiety disorder, unspecified: Secondary | ICD-10-CM | POA: Diagnosis not present

## 2021-07-23 DIAGNOSIS — E039 Hypothyroidism, unspecified: Secondary | ICD-10-CM | POA: Diagnosis not present

## 2021-07-23 DIAGNOSIS — I480 Paroxysmal atrial fibrillation: Secondary | ICD-10-CM | POA: Diagnosis not present

## 2021-07-23 DIAGNOSIS — I2510 Atherosclerotic heart disease of native coronary artery without angina pectoris: Secondary | ICD-10-CM | POA: Diagnosis not present

## 2021-07-23 DIAGNOSIS — E785 Hyperlipidemia, unspecified: Secondary | ICD-10-CM | POA: Diagnosis not present

## 2021-07-23 DIAGNOSIS — I10 Essential (primary) hypertension: Secondary | ICD-10-CM | POA: Diagnosis not present

## 2021-08-14 ENCOUNTER — Ambulatory Visit: Payer: Medicare Other | Admitting: Nurse Practitioner

## 2021-09-05 DIAGNOSIS — R457 State of emotional shock and stress, unspecified: Secondary | ICD-10-CM | POA: Diagnosis not present

## 2021-09-05 DIAGNOSIS — R52 Pain, unspecified: Secondary | ICD-10-CM | POA: Diagnosis not present

## 2021-09-08 DIAGNOSIS — R0789 Other chest pain: Secondary | ICD-10-CM | POA: Diagnosis not present

## 2021-09-08 DIAGNOSIS — E039 Hypothyroidism, unspecified: Secondary | ICD-10-CM | POA: Diagnosis not present

## 2021-09-08 DIAGNOSIS — I2510 Atherosclerotic heart disease of native coronary artery without angina pectoris: Secondary | ICD-10-CM | POA: Diagnosis not present

## 2021-09-08 DIAGNOSIS — E785 Hyperlipidemia, unspecified: Secondary | ICD-10-CM | POA: Diagnosis not present

## 2021-09-08 DIAGNOSIS — F419 Anxiety disorder, unspecified: Secondary | ICD-10-CM | POA: Diagnosis not present

## 2021-09-08 DIAGNOSIS — I10 Essential (primary) hypertension: Secondary | ICD-10-CM | POA: Diagnosis not present

## 2021-09-08 DIAGNOSIS — I480 Paroxysmal atrial fibrillation: Secondary | ICD-10-CM | POA: Diagnosis not present

## 2021-10-21 ENCOUNTER — Ambulatory Visit (INDEPENDENT_AMBULATORY_CARE_PROVIDER_SITE_OTHER): Payer: Medicare Other | Admitting: Family Medicine

## 2021-10-21 ENCOUNTER — Encounter: Payer: Self-pay | Admitting: Family Medicine

## 2021-10-21 DIAGNOSIS — R300 Dysuria: Secondary | ICD-10-CM | POA: Diagnosis not present

## 2021-10-21 DIAGNOSIS — N3090 Cystitis, unspecified without hematuria: Secondary | ICD-10-CM | POA: Diagnosis not present

## 2021-10-21 LAB — URINALYSIS, COMPLETE
Bilirubin, UA: NEGATIVE
Glucose, UA: NEGATIVE
Nitrite, UA: NEGATIVE
Specific Gravity, UA: 1.015 (ref 1.005–1.030)
Urobilinogen, Ur: 1 mg/dL (ref 0.2–1.0)
pH, UA: 7 (ref 5.0–7.5)

## 2021-10-21 LAB — MICROSCOPIC EXAMINATION
Epithelial Cells (non renal): NONE SEEN /hpf (ref 0–10)
WBC, UA: >30 /hpf — AB (ref 0–5)

## 2021-10-21 MED ORDER — SULFAMETHOXAZOLE-TRIMETHOPRIM 800-160 MG PO TABS
1.0000 | ORAL_TABLET | Freq: Two times a day (BID) | ORAL | 0 refills | Status: DC
Start: 2021-10-21 — End: 2021-11-12

## 2021-10-21 NOTE — Progress Notes (Signed)
Subjective:    Patient ID: Erica [name], female    DOB: 07/26/1930, 87 y.o.   MRN: [id number]   HPI: Erica [name] is a 87 y.o. female presenting for burning with urination and frequency for 2 days. Denies fever . No flank pain. No nausea, vomiting.     Depression screen [facility] 2/9 05/29/2021 05/18/2021 02/19/2021 06/03/2020 06/03/2020  Decreased Interest 0 0 0 1 0  Down, Depressed, Hopeless 0 0 0 2 0  PHQ - 2 Score 0 0 0 3 0  Altered sleeping - 0 0 2 -  Tired, decreased energy - 1 0 1 -  Change in appetite - 0 0 2 -  Feeling bad or failure about yourself  - 0 0 0 -  Trouble concentrating - 0 0 0 -  Moving slowly or fidgety/restless - 0 0 0 -  Suicidal thoughts - 0 0 0 -  PHQ-9 Score - 1 0 8 -  Difficult doing work/chores - Not difficult at all - Not difficult at all -  Some recent data might be hidden     Relevant past medical, surgical, family and social history reviewed and updated as indicated.  Interim medical history since our last visit reviewed. Allergies and medications reviewed and updated.  ROS:  Review of Systems  Constitutional:  Negative for chills, diaphoresis and fever.  HENT:  Negative for congestion.   Eyes:  Negative for visual disturbance.  Respiratory:  Negative for cough and shortness of breath.   Cardiovascular:  Negative for chest pain and palpitations.  Gastrointestinal:  Negative for constipation, diarrhea and nausea.  Genitourinary:  Positive for dysuria, frequency and urgency. Negative for decreased urine volume, flank pain, hematuria, menstrual problem and pelvic pain.  Musculoskeletal:  Negative for arthralgias and joint swelling.  Skin:  Negative for rash.  Neurological:  Negative for dizziness and numbness.    Social History   Tobacco Use  Smoking Status Never  Smokeless Tobacco Never       Objective:     Wt Readings from Last 3 Encounters:  05/29/21 130 lb (59 kg)  05/18/21 133 lb (60.3 kg)  02/19/21 130 lb (59 kg)     Exam  deferred. Pt. Harboring due to COVID 19. Phone visit performed.   Assessment & Plan:   1. Dysuria   2. Cystitis     Meds ordered this encounter  Medications   sulfamethoxazole-trimethoprim (BACTRIM DS) 800-160 MG tablet    Sig: Take 1 tablet by mouth 2 (two) times daily.    Dispense:  14 tablet    Refill:  0    Pt. Requests delivery, please.    Orders Placed This Encounter  Procedures   Urine Culture   Microscopic Examination   Urine Culture   Urinalysis, Complete      Diagnoses and all orders for this visit:  Dysuria -     Urinalysis, Complete -     Urine Culture -     Urine Culture  Cystitis  Other orders -     Microscopic Examination -     sulfamethoxazole-trimethoprim (BACTRIM DS) 800-160 MG tablet; Take 1 tablet by mouth 2 (two) times daily.   Virtual Visit via telephone Note  I discussed the limitations, risks, security and privacy concerns of performing an evaluation and management service by telephone and the availability of in person appointments. The patient was identified with two identifiers. Pt.expressed understanding and agreed to proceed. Pt. Is at home. Dr. Livia Snellen is  in his office.  Follow Up Instructions:   I discussed the assessment and treatment plan with the patient. The patient was provided an opportunity to ask questions and all were answered. The patient agreed with the plan and demonstrated an understanding of the instructions.   The patient was advised to call back or seek an in-person evaluation if the symptoms worsen or if the condition fails to improve as anticipated.   Total minutes phone contact time: 8   Follow up plan: Return if symptoms worsen or fail to improve.  Claretta Fraise, MD [place]

## 2021-10-26 ENCOUNTER — Encounter: Payer: Self-pay | Admitting: Family Medicine

## 2021-10-26 ENCOUNTER — Ambulatory Visit (INDEPENDENT_AMBULATORY_CARE_PROVIDER_SITE_OTHER): Payer: Medicare Other | Admitting: Family Medicine

## 2021-10-26 ENCOUNTER — Other Ambulatory Visit: Payer: Self-pay

## 2021-10-26 VITALS — BP 154/60 | HR 65 | Temp 98.4°F | Ht 61.0 in | Wt 124.5 lb

## 2021-10-26 DIAGNOSIS — R112 Nausea with vomiting, unspecified: Secondary | ICD-10-CM

## 2021-10-26 DIAGNOSIS — R52 Pain, unspecified: Secondary | ICD-10-CM | POA: Diagnosis not present

## 2021-10-26 DIAGNOSIS — I10 Essential (primary) hypertension: Secondary | ICD-10-CM | POA: Diagnosis not present

## 2021-10-26 DIAGNOSIS — N3090 Cystitis, unspecified without hematuria: Secondary | ICD-10-CM

## 2021-10-26 MED ORDER — CEPHALEXIN 500 MG PO CAPS: 500.0000 mg | ORAL_CAPSULE | Freq: Two times a day (BID) | ORAL | 0 refills | Status: DC

## 2021-10-26 MED ORDER — ONDANSETRON HCL 4 MG PO TABS: 4.0000 mg | ORAL_TABLET | Freq: Three times a day (TID) | ORAL | 0 refills | Status: DC | PRN

## 2021-10-26 NOTE — Progress Notes (Signed)
Acute Office Visit  Subjective:    Erica [name] ID: DOREENE [name], female    DOB: Aug 31, 1930, 87 y.o.   MRN: [id number]  Chief Complaint  Erica [name] presents with   Nausea   Emesis    HPI Here with son today. Erica [name] [name] in today for nausea and vomiting x 4 days. This started the day after Erica [name] started on Bactrim for a UTI. Erica [name] reports that Erica [name] urinary symptoms have improved. Erica [name] has had 3 episodes of vomiting today. Erica [name] able to keep down fluids and has tried to stay hydrated. Erica [name] denies fever or dizziness.    Past Medical History:  Diagnosis Date   Abnormal transaminases 05/09/2019   Abnormality of gait 08/25/2016   Adenomatous rectal polyp    Anxiety about health 05/09/2019   Arthritis 04/03/2018   L hip   Atrial fibrillation (HCC)    Bleeding internal hemorrhoids    Cataract    Chronic back pain    Depression    Diverticulosis of colon (without mention of hemorrhage)    Gastroparesis    GERD (gastroesophageal reflux disease)    Hiatal hernia 09/1999   3cm   Hyperlipidemia    Hypertension    Hypothyroid    Ischemic colitis ([place])    Left foot drop 7/62/2633   Lichen sclerosus et atrophicus of the vulva    MVP (mitral valve prolapse)    Osteoporosis    Sleep apnea    TIA (transient ischemic attack)    Weakness     Past Surgical History:  Procedure Laterality Date   CARDIAC CATHETERIZATION N/A 02/03/2016   Procedure: Coronary/Graft Angiography;  Surgeon: Charolette Forward, MD;  Location: [facility];  Service: Cardiovascular;  Laterality: N/A;   CATARACT EXTRACTION     CHOLECYSTECTOMY     COLONOSCOPY     Multiple, polyps   ESOPHAGEAL MANOMETRY N/A 06/14/2016   Procedure: ESOPHAGEAL MANOMETRY (EM);  Surgeon: Gatha Mayer, MD;  Location: [facility];  Service: Endoscopy;  Laterality: N/A;   LEFT HEART CATHETERIZATION WITH CORONARY ANGIOGRAM N/A 07/17/2013   Procedure: LEFT HEART CATHETERIZATION WITH CORONARY ANGIOGRAM;  Surgeon: Clent Demark, MD;  Location: [facility];  Service: Cardiovascular;  Laterality: N/A;   SIGMOIDOSCOPY  08/23/2011   TUBAL LIGATION     UPPER GASTROINTESTINAL ENDOSCOPY  08/23/2011   Normal    Family History  Problem Relation Age of Onset   Stomach cancer Brother    Colon cancer Paternal Aunt    Lung cancer Brother        smoker   Esophageal cancer Father    Stroke Mother     Social History   Socioeconomic History   Marital status: Widowed    Spouse name: Not on file   Number of children: 9   Years of education: Not on file   Highest education level: Not on file  Occupational History   Occupation: retired    Fish farm manager: RETIRED  Tobacco Use   Smoking status: Never   Smokeless tobacco: Never  Vaping Use   Vaping Use: Never used  Substance and Sexual Activity   Alcohol use: No   Drug use: No   Sexual activity: Not on file  Other Topics Concern   Not on file  Social History Narrative   Lives at home alone.   Right-handed.   2 cups caffeine most days.   Social Determinants of Health   Financial Resource Strain: Not on file  Food Insecurity: Not on  file  Transportation Needs: Not on file  Physical Activity: Not on file  Stress: Not on file  Social Connections: Not on file  Intimate Partner Violence: Not on file    Outpatient Medications Prior to Visit  Medication Sig Dispense Refill   alendronate (FOSAMAX) 70 MG tablet Take 1 tablet (70 mg total) by mouth every 7 (seven) days. Take with a full glass of water on an empty stomach. 4 tablet 11   amiodarone (PACERONE) 200 MG tablet Take 1 tablet (200 mg total) by mouth daily. 90 tablet 1   amLODipine (NORVASC) 5 MG tablet TAKE ONE TABLET BY MOUTH TWICE DAILY (MUST BE SEEN BEFORE NEXT REFILL) 180 tablet 1   apixaban (ELIQUIS) 2.5 MG TABS tablet TAKE (1) TABLET TWICE A DAY. 60 tablet 5   atorvastatin (LIPITOR) 10 MG tablet TAKE 1 TABLET ONCE DAILY AT 6PM 90 tablet 1   Cholecalciferol (VITAMIN D3) 125 MCG (5000 UT) CAPS Take 1 capsule by mouth every  other day.     escitalopram (LEXAPRO) 20 MG tablet Take 2 tablets (40 mg total) by mouth daily. 180 tablet 1   esomeprazole (NEXIUM) 40 MG capsule TAKE 1 CAPSULE TWICE DAILY BEFORE MEALS 180 capsule 1   furosemide (LASIX) 20 MG tablet Take 1 tablet (20 mg total) by mouth daily. 90 tablet 1   hydrocortisone (ANUSOL-HC) 2.5 % rectal cream Place 1 application rectally 2 (two) times daily. (Erica [name] taking differently: Place 1 application rectally 2 (two) times daily as needed for hemorrhoids or anal itching.) 30 g 1   levothyroxine (SYNTHROID) 88 MCG tablet TAKE (1) TABLET DAILY BE- FORE BREAKFAST. 90 tablet 1   Melatonin 5 MG CAPS Take 5 mg by mouth at bedtime as needed (sleep).     Multiple Vitamin (MULTIVITAMIN WITH MINERALS) TABS tablet Take 1 tablet by mouth daily.     olmesartan (BENICAR) 40 MG tablet Take 1 tablet (40 mg total) by mouth daily. 90 tablet 1   sulfamethoxazole-trimethoprim (BACTRIM DS) 800-160 MG tablet Take 1 tablet by mouth 2 (two) times daily. 14 tablet 0   Wheat Dextrin (BENEFIBER) POWD Take 1 Dose by mouth daily. Take daily     furosemide (LASIX) 40 MG tablet Take 1 tablet (40 mg total) by mouth daily for 5 days. 30 tablet 2   No facility-administered medications prior to visit.    Allergies  Allergen Reactions   Contrast Media [Iodinated Diagnostic Agents] Shortness Of Breath, Nausea Only and Swelling   Iodine Shortness Of Breath, Nausea Only and Swelling   Iohexol Hives and Swelling     Desc: hives, swelling over 5 yrs ago-pt has never been pre-medicated--was told to not have iv contrast ever    Ioxaglate Nausea Only, Shortness Of Breath and Swelling   Metoclopramide Other (See Comments)    Mouth tremors, insomnia, and irritability    Review of Systems As per HPI.     Objective:    Physical Exam Vitals and nursing note reviewed.  Constitutional:      General: Erica [name] not in acute distress.    Appearance: Erica [name] not ill-appearing, toxic-appearing or  diaphoretic.  Pulmonary:     Effort: Pulmonary effort [name] normal. No respiratory distress.  Abdominal:     General: There [name] no distension.     Palpations: Abdomen [name] soft.     Tenderness: There [name] no abdominal tenderness. There [name] no guarding or rebound.  Musculoskeletal:     Right lower leg: No edema.  Left lower leg: No edema.  Skin:    General: Skin [name] warm and dry.  Neurological:     Mental Status: Erica [name] alert and oriented to person, place, and time.  Psychiatric:        Mood and Affect: Mood normal.        Behavior: Behavior normal.    BP (!) 154/60   Pulse 65   Temp 98.4 F (36.9 C) (Temporal)   Ht 5' 1" (1.549 m)   Wt 124 lb 8 oz (56.5 kg)   BMI 23.52 kg/m  Wt Readings from Last 3 Encounters:  10/26/21 124 lb 8 oz (56.5 kg)  05/29/21 130 lb (59 kg)  05/18/21 133 lb (60.3 kg)    Health Maintenance Due  Topic Date Due   Zoster Vaccines- Shingrix (1 of 2) Never done   MAMMOGRAM  06/10/2020   COVID-19 Vaccine (3 - Booster for Moderna series) 01/13/2021    There are no preventive care reminders to display for this Erica [name].   Lab Results  Component Value Date   TSH 0.620 05/29/2021   Lab Results  Component Value Date   WBC 10.6 05/29/2021   HGB 12.7 05/29/2021   HCT 39.5 05/29/2021   MCV 94 05/29/2021   PLT 230 05/29/2021   Lab Results  Component Value Date   NA 137 05/29/2021   K 4.6 05/29/2021   CO2 21 05/29/2021   GLUCOSE 94 05/29/2021   BUN 14 05/29/2021   CREATININE 0.82 05/29/2021   BILITOT 0.3 05/29/2021   ALKPHOS 71 05/29/2021   AST 34 05/29/2021   ALT 32 05/29/2021   PROT 6.4 05/29/2021   ALBUMIN 3.9 05/29/2021   CALCIUM 9.2 05/29/2021   ANIONGAP 12 05/17/2020   EGFR 69 05/29/2021   GFR 53.05 (L) 06/06/2012   Lab Results  Component Value Date   CHOL 175 05/29/2021   Lab Results  Component Value Date   HDL 54 05/29/2021   Lab Results  Component Value Date   LDLCALC 99 05/29/2021   Lab Results  Component Value Date    TRIG 126 05/29/2021   Lab Results  Component Value Date   CHOLHDL 3.2 05/29/2021   No results found for: HGBA1C     Assessment & Plan:   Alvina was seen today for nausea and emesis.  Diagnoses and all orders for this visit:  Nausea and vomiting, unspecified vomiting type Review of record shows nausea and vomiting after taking bactrim. Discussed likely reaction to bactrim. Will changed to keflex today. Zofran for nausea and vomiting. Discussed hydration and return precautions.  -     ondansetron (ZOFRAN) 4 MG tablet; Take 1 tablet (4 mg total) by mouth every 8 (eight) hours as needed for nausea or vomiting.  Cystitis Changed to Keflex. Culture [name] pending as previous collected culture has not results.  -     cephALEXin (KEFLEX) 500 MG capsule; Take 1 capsule (500 mg total) by mouth 2 (two) times daily. -     Urine Culture  Return to office for new or worsening symptoms, or if symptoms persist.   The Erica [name] indicates understanding of these issues and agrees with the plan.  Gwenlyn Perking, [name]

## 2021-10-26 NOTE — Patient Instructions (Signed)
Nausea and Vomiting, Adult Nausea is the feeling that you have an upset stomach or that you are about to vomit. Vomiting is when stomach contents are thrown up and out of the mouth as a result of nausea. Vomiting can make you feel weak and cause you to become dehydrated. Dehydration can make you feel tired and thirsty, cause you to have a dry mouth, and decrease how often you urinate. Older adults and people with other diseases or a weak disease-fighting system (immune system) are at higher risk for dehydration. It is important to treat your nausea and vomiting as told by your health care provider. Follow these instructions at home: Watch your symptoms for any changes. Tell your health care provider about them. Follow these instructions to care for yourself at home. Eating and drinking   Take an oral rehydration solution (ORS). This is a drink that is sold at pharmacies and retail stores. Drink clear fluids slowly and in small amounts as you are able. Clear fluids include water, ice chips, low-calorie sports drinks, and fruit juice that has water added (diluted fruit juice). Eat bland, easy-to-digest foods in small amounts as you are able. These foods include bananas, applesauce, rice, lean meats, toast, and crackers. Avoid fluids that contain a lot of sugar or caffeine, such as energy drinks, sports drinks, and soda. Avoid alcohol. Avoid spicy or fatty foods. General instructions Take over-the-counter and prescription medicines only as told by your health care provider. Drink enough fluid to keep your urine pale yellow. Wash your hands often using soap and water. If soap and water are not available, use hand sanitizer. Make sure that all people in your household wash their hands well and often. Rest at home while you recover. Watch your condition for any changes. Breathe slowly and deeply when you feel nauseated. Keep all follow-up visits as told by your health care provider. This is  important. Contact a health care provider if: Your symptoms get worse. You have new symptoms. You have a fever. You cannot drink fluids without vomiting. Your nausea does not go away after 2 days. You feel light-headed or dizzy. You have a headache. You have muscle cramps. You have a rash. You have pain while urinating. Get help right away if: You have pain in your chest, neck, arm, or jaw. You feel extremely weak or you faint. You have persistent vomiting. You have vomit that is bright red or looks like black coffee grounds. You have bloody or black stools or stools that look like tar. You have a severe headache, a stiff neck, or both. You have severe pain, cramping, or bloating in your abdomen. You have difficulty breathing, or you are breathing very quickly. Your heart is beating very quickly. Your skin feels cold and clammy. You feel confused. You have signs of dehydration, such as: Dark urine, very little urine, or no urine. Cracked lips. Dry mouth. Sunken eyes. Sleepiness. Weakness. These symptoms may represent a serious problem that is an emergency. Do not wait to see if the symptoms will go away. Get medical help right away. Call your local emergency services (911 in the [place] Do not drive yourself to the hospital. Summary Nausea is the feeling that you have an upset stomach or that you are about to vomit. As nausea gets worse, it can lead to vomiting. Vomiting can make you feel weak and cause you to become dehydrated. Follow instructions from your health care provider about eating and drinking to prevent dehydration. Take over-the-counter and  prescription medicines only as told by your health care provider. Contact your health care provider if your symptoms get worse, or you have new symptoms. Keep all follow-up visits as told by your health care provider. This is important. This information is not intended to replace advice given to you by your health care provider.  Make sure you discuss any questions you have with your health care provider. Document Revised: 03/04/2021 Document Reviewed: 05/23/2018 Elsevier Patient Education  [place].

## 2021-10-28 ENCOUNTER — Telehealth: Payer: Self-pay | Admitting: Nurse Practitioner

## 2021-10-28 LAB — URINE CULTURE

## 2021-10-28 NOTE — Telephone Encounter (Signed)
Left message for patient to call back and schedule Medicare Annual Wellness Visit (AWV) either virtually   *due 12/27/2009 awvi per [facility] please schedule at anytime with health coach  This should be a 45 minute visit.

## 2021-10-29 LAB — URINE CULTURE

## 2021-10-30 ENCOUNTER — Telehealth: Payer: Self-pay | Admitting: Nurse Practitioner

## 2021-10-30 NOTE — Telephone Encounter (Signed)
Pt aware of urine culture results and recommendations and voiced understanding.

## 2021-10-30 NOTE — Telephone Encounter (Signed)
Urine culture was negative on 10/26/21. She can discontinue antibiotics.

## 2021-10-30 NOTE — Telephone Encounter (Signed)
Pt calling to check on urine that was left on 10/31. Please review and call back.

## 2021-11-03 ENCOUNTER — Ambulatory Visit: Payer: Medicare Other

## 2021-11-05 DIAGNOSIS — I2510 Atherosclerotic heart disease of native coronary artery without angina pectoris: Secondary | ICD-10-CM | POA: Diagnosis not present

## 2021-11-05 DIAGNOSIS — I10 Essential (primary) hypertension: Secondary | ICD-10-CM | POA: Diagnosis not present

## 2021-11-05 DIAGNOSIS — I480 Paroxysmal atrial fibrillation: Secondary | ICD-10-CM | POA: Diagnosis not present

## 2021-11-05 DIAGNOSIS — E785 Hyperlipidemia, unspecified: Secondary | ICD-10-CM | POA: Diagnosis not present

## 2021-11-12 ENCOUNTER — Ambulatory Visit (INDEPENDENT_AMBULATORY_CARE_PROVIDER_SITE_OTHER): Payer: Medicare Other | Admitting: Family Medicine

## 2021-11-12 ENCOUNTER — Encounter: Payer: Self-pay | Admitting: Family Medicine

## 2021-11-12 DIAGNOSIS — I10 Essential (primary) hypertension: Secondary | ICD-10-CM | POA: Diagnosis not present

## 2021-11-12 DIAGNOSIS — R399 Unspecified symptoms and signs involving the genitourinary system: Secondary | ICD-10-CM | POA: Diagnosis not present

## 2021-11-12 DIAGNOSIS — E039 Hypothyroidism, unspecified: Secondary | ICD-10-CM | POA: Diagnosis not present

## 2021-11-12 DIAGNOSIS — E785 Hyperlipidemia, unspecified: Secondary | ICD-10-CM | POA: Diagnosis not present

## 2021-11-12 MED ORDER — DOXYCYCLINE HYCLATE 100 MG PO TABS: 100.0000 mg | ORAL_TABLET | Freq: Two times a day (BID) | ORAL | 0 refills | Status: DC

## 2021-11-12 NOTE — Progress Notes (Signed)
Virtual Visit via telephone Note  I connected with Erica [name] on 11/12/21 at 1709 by telephone and verified that I am speaking with the correct person using two identifiers. Erica [name] is currently located at home and patient are currently with her during visit. The provider, Fransisca Kaufmann Jennfer Gassen, MD is located in their office at time of visit.  Call ended at 1715  I discussed the limitations, risks, security and privacy concerns of performing an evaluation and management service by telephone and the availability of in person appointments. I also discussed with the patient that there may be a patient responsible charge related to this service. The patient expressed understanding and agreed to proceed.   History and Present Illness: Patient is calling in for urinary burning and was treated 2 weeks ago with same.  She just started today with the burning again.  She denies any fevers or chills or kidney pain. She was treated with cephalexin and it helped.   1. UTI symptoms     Outpatient Encounter Medications as of 11/12/2021  Medication Sig   doxycycline (VIBRA-TABS) 100 MG tablet Take 1 tablet (100 mg total) by mouth 2 (two) times daily. 1 po bid   alendronate (FOSAMAX) 70 MG tablet Take 1 tablet (70 mg total) by mouth every 7 (seven) days. Take with a full glass of water on an empty stomach.   amiodarone (PACERONE) 200 MG tablet Take 1 tablet (200 mg total) by mouth daily.   amLODipine (NORVASC) 5 MG tablet TAKE ONE TABLET BY MOUTH TWICE DAILY (MUST BE SEEN BEFORE NEXT REFILL)   apixaban (ELIQUIS) 2.5 MG TABS tablet TAKE (1) TABLET TWICE A DAY.   atorvastatin (LIPITOR) 10 MG tablet TAKE 1 TABLET ONCE DAILY AT 6PM   Cholecalciferol (VITAMIN D3) 125 MCG (5000 UT) CAPS Take 1 capsule by mouth every other day.   escitalopram (LEXAPRO) 20 MG tablet Take 2 tablets (40 mg total) by mouth daily.   esomeprazole (NEXIUM) 40 MG capsule TAKE 1 CAPSULE TWICE DAILY BEFORE MEALS   furosemide  (LASIX) 20 MG tablet Take 1 tablet (20 mg total) by mouth daily.   hydrocortisone (ANUSOL-HC) 2.5 % rectal cream Place 1 application rectally 2 (two) times daily. (Patient taking differently: Place 1 application rectally 2 (two) times daily as needed for hemorrhoids or anal itching.)   levothyroxine (SYNTHROID) 88 MCG tablet TAKE (1) TABLET DAILY BE- FORE BREAKFAST.   Melatonin 5 MG CAPS Take 5 mg by mouth at bedtime as needed (sleep).   Multiple Vitamin (MULTIVITAMIN WITH MINERALS) TABS tablet Take 1 tablet by mouth daily.   olmesartan (BENICAR) 40 MG tablet Take 1 tablet (40 mg total) by mouth daily.   ondansetron (ZOFRAN) 4 MG tablet Take 1 tablet (4 mg total) by mouth every 8 (eight) hours as needed for nausea or vomiting.   Wheat Dextrin (BENEFIBER) POWD Take 1 Dose by mouth daily. Take daily   [DISCONTINUED] cephALEXin (KEFLEX) 500 MG capsule Take 1 capsule (500 mg total) by mouth 2 (two) times daily.   [DISCONTINUED] sulfamethoxazole-trimethoprim (BACTRIM DS) 800-160 MG tablet Take 1 tablet by mouth 2 (two) times daily.   No facility-administered encounter medications on file as of 11/12/2021.    Review of Systems  Constitutional:  Negative for chills and fever.  Eyes:  Negative for visual disturbance.  Respiratory:  Negative for chest tightness and shortness of breath.   Cardiovascular:  Negative for chest pain and leg swelling.  Genitourinary:  Positive for dysuria, frequency  and urgency. Negative for difficulty urinating, flank pain, hematuria, vaginal bleeding, vaginal discharge and vaginal pain.  Musculoskeletal:  Negative for back pain and gait problem.  Skin:  Negative for rash.  Neurological:  Negative for light-headedness and headaches.  Psychiatric/Behavioral:  Negative for agitation and behavioral problems.   All other systems reviewed and are negative.  Observations/Objective: Patient sounds comfortable and in no acute distress  Assessment and Plan: Problem List Items  Addressed This Visit   None Visit Diagnoses     UTI symptoms    -  Primary   Relevant Medications   doxycycline (VIBRA-TABS) 100 MG tablet       Will send doxy and drink fluids Follow up plan: Return if symptoms worsen or fail to improve.     I discussed the assessment and treatment plan with the patient. The patient was provided an opportunity to ask questions and all were answered. The patient agreed with the plan and demonstrated an understanding of the instructions.   The patient was advised to call back or seek an in-person evaluation if the symptoms worsen or if the condition fails to improve as anticipated.  The above assessment and management plan was discussed with the patient. The patient verbalized understanding of and has agreed to the management plan. Patient is aware to call the clinic if symptoms persist or worsen. Patient is aware when to return to the clinic for a follow-up visit. Patient educated on when it is appropriate to go to the emergency department.    I provided 6 minutes of non-face-to-face time during this encounter.    Worthy Rancher, MD

## 2021-11-12 NOTE — Addendum Note (Signed)
Addended by: Caryl Pina on: 11/12/2021 05:17 PM   Modules accepted: Orders

## 2021-11-13 ENCOUNTER — Other Ambulatory Visit: Payer: Medicare Other

## 2021-11-13 ENCOUNTER — Telehealth: Payer: Self-pay | Admitting: Family Medicine

## 2021-11-13 DIAGNOSIS — R399 Unspecified symptoms and signs involving the genitourinary system: Secondary | ICD-10-CM | POA: Diagnosis not present

## 2021-11-13 LAB — MICROSCOPIC EXAMINATION: WBC, UA: >30 /hpf — AB (ref 0–5)

## 2021-11-13 LAB — URINALYSIS, COMPLETE
Bilirubin, UA: NEGATIVE
Glucose, UA: NEGATIVE
Ketones, UA: NEGATIVE
Nitrite, UA: NEGATIVE
Protein,UA: NEGATIVE
Specific Gravity, UA: 1.015 (ref 1.005–1.030)
Urobilinogen, Ur: 1 mg/dL (ref 0.2–1.0)
pH, UA: 7 (ref 5.0–7.5)

## 2021-11-13 NOTE — Telephone Encounter (Signed)
Yes I did a phone visit in the evening hours with her, she had come in yesterday and left a urine sample but I think they chucked it because there was no order in and I did not talk to her until 5 PM.  I told her that she could come in and leave a urine today and I did place the order already but I have not seen the results, can we look for it for me please.

## 2021-11-13 NOTE — Telephone Encounter (Signed)
Pt called to see if her urine result had come back because she says she is in a lot of pain and needs some medicine called in for her.  I looked in her chart to see if results were in but only saw future order for urine.  Asked Janace Hoard in lab about this and she said that pt did leave a urine sample but pt only mentioned doing lab work for her specialist doctor and she didn't see a urine order from the specialist doctor; only blood work. So she disgarded the urine sample.  Called pt and explained this to her. She is going to have someone bring another urine sample.

## 2021-11-13 NOTE — Telephone Encounter (Signed)
Patent had a visit with Det[facility]. Do you know anything about this?

## 2021-11-13 NOTE — Telephone Encounter (Signed)
Pt has been informed of UA results. Awaiting culture results. She is aware to go ahead and start ATB and drink lots of fluids.

## 2021-11-18 LAB — URINE CULTURE

## 2021-11-20 ENCOUNTER — Other Ambulatory Visit: Payer: Self-pay | Admitting: Nurse Practitioner

## 2021-11-20 DIAGNOSIS — E782 Mixed hyperlipidemia: Secondary | ICD-10-CM

## 2021-11-20 DIAGNOSIS — F3341 Major depressive disorder, recurrent, in partial remission: Secondary | ICD-10-CM

## 2021-11-20 DIAGNOSIS — K219 Gastro-esophageal reflux disease without esophagitis: Secondary | ICD-10-CM

## 2021-11-20 DIAGNOSIS — I10 Essential (primary) hypertension: Secondary | ICD-10-CM

## 2021-11-23 ENCOUNTER — Ambulatory Visit (INDEPENDENT_AMBULATORY_CARE_PROVIDER_SITE_OTHER): Payer: Medicare Other

## 2021-11-23 ENCOUNTER — Telehealth: Payer: Self-pay | Admitting: Nurse Practitioner

## 2021-11-23 VITALS — Ht 61.0 in | Wt 125.0 lb

## 2021-11-23 DIAGNOSIS — Z0000 Encounter for general adult medical examination without abnormal findings: Secondary | ICD-10-CM | POA: Diagnosis not present

## 2021-11-23 NOTE — Progress Notes (Signed)
Subjective:   Erica [name] is a 87 y.o. female who presents for an Initial Medicare Annual Wellness Visit. Virtual Visit via Telephone Note  I connected with  Erica [name] on 11/23/21 at  2:00 PM EST by telephone and verified that I am speaking with the correct person using two identifiers.  Location: Patient: Home Provider: [facility] Persons participating in the virtual visit: patient/Nurse Health Advisor   I discussed the limitations, risks, security and privacy concerns of performing an evaluation and management service by telephone and the availability of in person appointments. The patient expressed understanding and agreed to proceed.  Interactive audio and video telecommunications were attempted between this nurse and patient, however failed, due to patient having technical difficulties OR patient did not have access to video capability.  We continued and completed visit with audio only.  Some vital signs may be absent or patient reported.   Chriss Driver, LPN  Review of Systems     Cardiac Risk Factors include: advanced age (>46men, >71 women);hypertension;dyslipidemia;sedentary lifestyle     Objective:    Today's Vitals   11/23/21 1350  Weight: 125 lb (56.7 kg)  Height: 5\' 1"  (1.549 m)   Body mass index is 23.62 kg/m.  Advanced Directives 11/23/2021 05/17/2020 04/23/2019 04/03/2018 08/21/2017 04/21/2016 01/31/2016  Does Patient Have a Medical Advance Directive? Yes Yes Yes Yes No No No  Type of Paramedic of [place];Living will [place];Living will Living will - - - -  Copy of [place] in Chart? No - copy requested - - - - - -  Would patient like information on creating a medical advance directive? No - Patient declined - No - Patient declined - No - Patient declined - No - patient declined information    Current Medications (verified) Outpatient Encounter Medications as of 11/23/2021  Medication  Sig   alendronate (FOSAMAX) 70 MG tablet Take 1 tablet (70 mg total) by mouth every 7 (seven) days. Take with a full glass of water on an empty stomach.   amLODipine (NORVASC) 5 MG tablet Take 1 tablet by mouth 2 (two) times daily.   apixaban (ELIQUIS) 2.5 MG TABS tablet TAKE (1) TABLET TWICE A DAY.   atorvastatin (LIPITOR) 10 MG tablet TAKE 1 TABLET ONCE DAILY AT 6PM  (NEEDS TO BE SEEN BEFORE NEXT REFILL)   Cholecalciferol (VITAMIN D3) 125 MCG (5000 UT) CAPS Take 1 capsule by mouth every other day.   doxycycline (VIBRA-TABS) 100 MG tablet Take 1 tablet (100 mg total) by mouth 2 (two) times daily. 1 po bid   escitalopram (LEXAPRO) 20 MG tablet Take 2 tablets (40 mg total) by mouth daily. (NEEDS TO BE SEEN BEFORE NEXT REFILL)   esomeprazole (NEXIUM) 40 MG capsule TAKE 1 CAPSULE 2 TIMES A DAY BEFORE MEALS  (NEEDS TO BE SEEN BEFORE NEXT REFILL)   furosemide (LASIX) 20 MG tablet Take 1 tablet (20 mg total) by mouth daily. (NEEDS TO BE SEEN BEFORE NEXT REFILL)   hydrocortisone (ANUSOL-HC) 2.5 % rectal cream Place 1 application rectally 2 (two) times daily. (Patient taking differently: Place 1 application rectally 2 (two) times daily as needed for hemorrhoids or anal itching.)   hydrocortisone 2.5 % cream Place rectally.   levothyroxine (SYNTHROID) 88 MCG tablet TAKE (1) TABLET DAILY BE- FORE BREAKFAST.   Melatonin 5 MG CAPS Take 5 mg by mouth at bedtime as needed (sleep).   Multiple Vitamin (MULTIVITAMIN WITH MINERALS) TABS tablet Take  1 tablet by mouth daily.   olmesartan (BENICAR) 40 MG tablet Take 1 tablet (40 mg total) by mouth daily.   olmesartan (BENICAR) 40 MG tablet Take 1 tablet by mouth every morning.   ondansetron (ZOFRAN) 4 MG tablet Take 1 tablet (4 mg total) by mouth every 8 (eight) hours as needed for nausea or vomiting.   Pediatric Multiple Vitamins (FLINTSTONES PLUS EXTRA C) CHEW Chew by mouth.   Wheat Dextrin (BENEFIBER) POWD Take 1 Dose by mouth daily. Take daily   amiodarone  (PACERONE) 200 MG tablet Take 1 tablet (200 mg total) by mouth daily. (Patient not taking: Reported on 11/23/2021)   amiodarone (PACERONE) 200 MG tablet Take by mouth. (Patient not taking: Reported on 11/23/2021)   amLODipine (NORVASC) 5 MG tablet TAKE 1 TABLET 2 TIMES A DAY  (NEEDS TO BE SEEN BEFORE NEXT REFILL) (Patient not taking: Reported on 11/23/2021)   No facility-administered encounter medications on file as of 11/23/2021.    Allergies (verified) Contrast media [iodinated diagnostic agents], Iodine, Iohexol, Ioxaglate, Bactrim [sulfamethoxazole-trimethoprim], and Metoclopramide   History: Past Medical History:  Diagnosis Date   Abnormal transaminases 05/09/2019   Abnormality of gait 08/25/2016   Adenomatous rectal polyp    Anxiety about health 05/09/2019   Arthritis 04/03/2018   L hip   Atrial fibrillation (HCC)    Bleeding internal hemorrhoids    Cataract    Chronic back pain    Depression    Diverticulosis of colon (without mention of hemorrhage)    Gastroparesis    GERD (gastroesophageal reflux disease)    Hiatal hernia 09/1999   3cm   Hyperlipidemia    Hypertension    Hypothyroid    Ischemic colitis ([place])    Left foot drop 4/43/1540   Lichen sclerosus et atrophicus of the vulva    MVP (mitral valve prolapse)    Osteoporosis    Sleep apnea    TIA (transient ischemic attack)    Weakness    Past Surgical History:  Procedure Laterality Date   CARDIAC CATHETERIZATION N/A 02/03/2016   Procedure: Coronary/Graft Angiography;  Surgeon: Charolette Forward, MD;  Location: [facility];  Service: Cardiovascular;  Laterality: N/A;   CATARACT EXTRACTION     CHOLECYSTECTOMY     COLONOSCOPY     Multiple, polyps   ESOPHAGEAL MANOMETRY N/A 06/14/2016   Procedure: ESOPHAGEAL MANOMETRY (EM);  Surgeon: Gatha Mayer, MD;  Location: [facility];  Service: Endoscopy;  Laterality: N/A;   LEFT HEART CATHETERIZATION WITH CORONARY ANGIOGRAM N/A 07/17/2013   Procedure: LEFT HEART  CATHETERIZATION WITH CORONARY ANGIOGRAM;  Surgeon: Clent Demark, MD;  Location: [facility];  Service: Cardiovascular;  Laterality: N/A;   SIGMOIDOSCOPY  08/23/2011   TUBAL LIGATION     UPPER GASTROINTESTINAL ENDOSCOPY  08/23/2011   Normal   Family History  Problem Relation Age of Onset   Stomach cancer Brother    Colon cancer Paternal Aunt    Lung cancer Brother        smoker   Esophageal cancer Father    Stroke Mother    Social History   Socioeconomic History   Marital status: Widowed    Spouse name: Not on file   Number of children: 9   Years of education: Not on file   Highest education level: Not on file  Occupational History   Occupation: retired    Fish farm manager: RETIRED  Tobacco Use   Smoking status: Never   Smokeless tobacco: Never  Vaping Use  Vaping Use: Never used  Substance and Sexual Activity   Alcohol use: No   Drug use: No   Sexual activity: Not on file  Other Topics Concern   Not on file  Social History Narrative   Lives at home alone.   Right-handed.   2 cups caffeine most days.   62 grandkids   21 great grand kids   Social Determinants of Radio broadcast assistant Strain: Low Risk    Difficulty of Paying Living Expenses: Not hard at all  Food Insecurity: No Food Insecurity   Worried About Charity fundraiser in the Last Year: Never true   Arboriculturist in the Last Year: Never true  Transportation Needs: No Transportation Needs   Lack of Transportation (Medical): No   Lack of Transportation (Non-Medical): No  Physical Activity: Insufficiently Active   Days of Exercise per Week: 3 days   Minutes of Exercise per Session: 30 min  Stress: No Stress Concern Present   Feeling of Stress : Not at all  Social Connections: Moderately Integrated   Frequency of Communication with Friends and Family: More than three times a week   Frequency of Social Gatherings with Friends and Family: More than three times a week   Attends Religious Services: More  than 4 times per year   Active Member of [employer] or Organizations: Yes   Attends Archivist Meetings: More than 4 times per year   Marital Status: Widowed    Tobacco Counseling Counseling given: Not Answered   Clinical Intake:  Pre-visit preparation completed: Yes  Pain : No/denies pain     BMI - recorded: 23.62 Nutritional Status: BMI 25 -29 Overweight Nutritional Risks: None Diabetes: No  How often do you need to have someone help you when you read instructions, pamphlets, or other written materials from your doctor or pharmacy?: 1 - Never  Diabetic?No  Interpreter Needed?: No  Information entered by :: MJ Joangel Vanosdol, LPN   Activities of Daily Living In your present state of health, do you have any difficulty performing the following activities: 11/23/2021  Hearing? Y  Comment wears hearing aids.  Vision? N  Difficulty concentrating or making decisions? N  Walking or climbing stairs? N  Dressing or bathing? N  Doing errands, shopping? N  Preparing Food and eating ? N  Using the Toilet? N  In the past six months, have you accidently leaked urine? Y  Comment recently.  Do you have problems with loss of bowel control? N  Managing your Medications? N  Managing your Finances? N  Housekeeping or managing your Housekeeping? N  Some recent data might be hidden    Patient Care Team: [name], [name] as PCP - General (Family Medicine) Jovita Gamma, MD as Consulting Physician (Neurosurgery)  Indicate any recent Medical Services you may have received from other than [facility] providers in the past year (date may be approximate).     Assessment:   This is a routine wellness examination for Erica [name].  Hearing/Vision screen Hearing Screening - Comments:: Some hearing issues. Wears hearing aids. Vision Screening - Comments:: Glasses. [facility].  Dietary issues and exercise activities discussed: Current Exercise Habits: Home exercise routine, Type  of exercise: walking, Time (Minutes): 30, Frequency (Times/Week): 3, Weekly Exercise (Minutes/Week): 90, Intensity: Mild, Exercise limited by: cardiac condition(s)   Goals Addressed             This Visit's Progress    Have 3 meals a  day       Continue to eat healthy and exercise as tolerated.        Depression Screen PHQ 2/9 Scores 11/23/2021 05/29/2021 05/18/2021 02/19/2021 06/03/2020 06/03/2020 02/25/2020  PHQ - 2 Score 0 0 0 0 3 0 0  PHQ- 9 Score - - 1 0 8 - -    Fall Risk Fall Risk  11/23/2021 05/29/2021 05/18/2021 02/19/2021 06/03/2020  Falls in the past year? 1 1 0 0 0  Number falls in past yr: 1 0 - - -  Injury with Fall? 0 0 - - -  Comment - - - - -  Risk for fall due to : Impaired balance/gait;Impaired mobility;Orthopedic patient History of fall(s) - - -  Follow up Falls prevention discussed Education provided - - -    FALL RISK PREVENTION PERTAINING TO THE HOME:  Any stairs in or around the home? Yes  If so, are there any without handrails? No  Home free of loose throw rugs in walkways, pet beds, electrical cords, etc? Yes  Adequate lighting in your home to reduce risk of falls? Yes   ASSISTIVE DEVICES UTILIZED TO PREVENT FALLS:  Life alert? No  Use of a cane, walker or w/c? Yes  Grab bars in the bathroom? Yes  Shower chair or bench in shower? Yes  Elevated toilet seat or a handicapped toilet? No   TIMED UP AND GO:  Was the test performed? No . Phone visit.   Cognitive Function:     6CIT Screen 11/23/2021  What Year? 0 points  What month? 0 points  What time? 0 points  Count back from 20 0 points  Months in reverse 0 points  Repeat phrase 0 points  Total Score 0    Immunizations Immunization History  Administered Date(s) Administered   Fluad Quad(high Dose 65+) 10/04/2019, 10/22/2020   Influenza, High Dose Seasonal PF 10/03/2013, 09/20/2014, 09/27/2016, 10/12/2017, 10/11/2018   Influenza,inj,Quad PF,6+ Mos 10/08/2015   Influenza-Unspecified 10/17/1997,  10/11/2001, 10/08/2015   Moderna Sars-Covid-2 Vaccination 03/10/2020, 11/18/2020   Pneumococcal Conjugate-13 09/27/2016   Pneumococcal Polysaccharide-23 10/17/1997, 11/16/2013   Tdap 10/05/2011, 08/21/2017   Zoster, Live 10/03/2013    TDAP status: Up to date  Flu Vaccine status: Due, Education has been provided regarding the importance of this vaccine. Advised may receive this vaccine at local pharmacy or Health Dept. Aware to provide a copy of the vaccination record if obtained from local pharmacy or Health Dept. Verbalized acceptance and understanding.  Pneumococcal vaccine status: Up to date  Covid-19 vaccine status: Information provided on how to obtain vaccines.   Qualifies for Shingles Vaccine? Yes   Zostavax completed Yes   Shingrix Completed?: No.    Education has been provided regarding the importance of this vaccine. Patient has been advised to call insurance company to determine out of pocket expense if they have not yet received this vaccine. Advised may also receive vaccine at local pharmacy or Health Dept. Verbalized acceptance and understanding.  Screening Tests Health Maintenance  Topic Date Due   Zoster Vaccines- Shingrix (1 of 2) Never done   MAMMOGRAM  06/10/2020   COVID-19 Vaccine (3 - Booster for Moderna series) 01/13/2021   INFLUENZA VACCINE  03/26/2022 (Originally 07/27/2021)   COLONOSCOPY (Pts 45-48yrs Insurance coverage will need to be confirmed)  05/29/2022 (Originally 07/01/2017)   TETANUS/TDAP  08/22/2027   Pneumonia Vaccine 29+ Years old  Completed   DEXA SCAN  Completed   HPV [place]  Maintenance  Health Maintenance Due  Topic Date Due   Zoster Vaccines- Shingrix (1 of 2) Never done   MAMMOGRAM  06/10/2020   COVID-19 Vaccine (3 - Booster for Moderna series) 01/13/2021    Colorectal cancer screening: No longer required.   Mammogram status: No longer required due to AGE.  Bone Density status: Completed 04/02/2021. Results  reflect: Bone density results: OSTEOPOROSIS. Repeat every 2 years.  Lung Cancer Screening: (Low Dose CT Chest recommended if Age 18-80 years, 30 pack-year currently smoking OR have quit w/in 15years.) does not qualify.  Non smoker  Additional Screening:  Hepatitis C Screening: does not qualify;   Vision Screening: Recommended annual ophthalmology exams for early detection of glaucoma and other disorders of the eye. Is the patient up to date with their annual eye exam?  Yes  Who is the provider or what is the name of the office in which the patient attends annual eye exams? [facility] in [place] If pt is not established with a provider, would they like to be referred to a provider to establish care? No .   Dental Screening: Recommended annual dental exams for proper oral hygiene  Community Resource Referral / Chronic Care Management: CRR required this visit?  No   CCM required this visit?  No      Plan:     I have personally reviewed and noted the following in the patient's chart:   Medical and social history Use of alcohol, tobacco or illicit drugs  Current medications and supplements including opioid prescriptions. Patient is not currently taking opioid prescriptions. Functional ability and status Nutritional status Physical activity Advanced directives List of other physicians Hospitalizations, surgeries, and ER visits in previous 12 months Vitals Screenings to include cognitive, depression, and falls Referrals and appointments  In addition, I have reviewed and discussed with patient certain preventive protocols, quality metrics, and best practice recommendations. A written personalized care plan for preventive services as well as general preventive health recommendations were provided to patient.     Chriss Driver, LPN   15/40/0867   Nurse Notes: Phone visit. Pt up to date on all age appropriate health maintenance and vaccines. Pt states she had to delay her flu  vaccine due to being sick but will reschedule.

## 2021-11-23 NOTE — Telephone Encounter (Signed)
Pt aware of results and that she has finished Doxy and is better

## 2021-11-23 NOTE — Patient Instructions (Signed)
[name] , Thank you for taking time to come for your Medicare Wellness Visit. I appreciate your ongoing commitment to your health goals. Please review the following plan we discussed and let me know if I can assist you in the future.   Screening recommendations/referrals: Colonoscopy: No longer required due to age.  Mammogram: Done 06/11/2019 Repeat annually  Bone Density: Done 04/02/2021. Repeat every 2 years  Recommended yearly ophthalmology/optometry visit for glaucoma screening and checkup Recommended yearly dental visit for hygiene and checkup  Vaccinations: Influenza vaccine: Due Repeat annually  Pneumococcal vaccine: Done 11/16/2013 and 09/27/2016 Tdap vaccine: Done 08/21/2017 Repeat in 10 years  Shingles vaccine: Shingrix discussed. Please contact your pharmacy for coverage information.     Covid-19:Done 03/10/2020 and 11/18/2020  Advanced directives: Please bring a copy of your health care power of attorney and living will to the office to be added to your chart at your convenience.   Conditions/risks identified: Aim for 30 minutes of exercise or walking each day, drink 6-8 glasses of water and eat lots of fruits and vegetables. KEEP UP THE GOOD WORK!!!  Next appointment: Follow up in one year for your annual wellness visit 2023.   Preventive Care 32 Years and Older, Female Preventive care refers to lifestyle choices and visits with your health care provider that can promote health and wellness. What does preventive care include? A yearly physical exam. This is also called an annual well check. Dental exams once or twice a year. Routine eye exams. Ask your health care provider how often you should have your eyes checked. Personal lifestyle choices, including: Daily care of your teeth and gums. Regular physical activity. Eating a healthy diet. Avoiding tobacco and drug use. Limiting alcohol use. Practicing safe sex. Taking low-dose aspirin every day. Taking vitamin  and mineral supplements as recommended by your health care provider. What happens during an annual well check? The services and screenings done by your health care provider during your annual well check will depend on your age, overall health, lifestyle risk factors, and family history of disease. Counseling  Your health care provider may ask you questions about your: Alcohol use. Tobacco use. Drug use. Emotional well-being. Home and relationship well-being. Sexual activity. Eating habits. History of falls. Memory and ability to understand (cognition). Work and work Statistician. Reproductive health. Screening  You may have the following tests or measurements: Height, weight, and BMI. Blood pressure. Lipid and cholesterol levels. These may be checked every 5 years, or more frequently if you are over 80 years old. Skin check. Lung cancer screening. You may have this screening every year starting at age 9 if you have a 30-pack-year history of smoking and currently smoke or have quit within the past 15 years. Fecal occult blood test (FOBT) of the stool. You may have this test every year starting at age 58. Flexible sigmoidoscopy or colonoscopy. You may have a sigmoidoscopy every 5 years or a colonoscopy every 10 years starting at age 39. Hepatitis C blood test. Hepatitis B blood test. Sexually transmitted disease (STD) testing. Diabetes screening. This is done by checking your blood sugar (glucose) after you have not eaten for a while (fasting). You may have this done every 1-3 years. Bone density scan. This is done to screen for osteoporosis. You may have this done starting at age 62. Mammogram. This may be done every 1-2 years. Talk to your health care provider about how often you should have regular mammograms. Talk with your health care provider about  your test results, treatment options, and if necessary, the need for more tests. Vaccines  Your health care provider may recommend  certain vaccines, such as: Influenza vaccine. This is recommended every year. Tetanus, diphtheria, and acellular pertussis (Tdap, Td) vaccine. You may need a Td booster every 10 years. Zoster vaccine. You may need this after age 74. Pneumococcal 13-valent conjugate (PCV13) vaccine. One dose is recommended after age 64. Pneumococcal polysaccharide (PPSV23) vaccine. One dose is recommended after age 27. Talk to your health care provider about which screenings and vaccines you need and how often you need them. This information is not intended to replace advice given to you by your health care provider. Make sure you discuss any questions you have with your health care provider. Document Released: 01/09/2016 Document Revised: 09/01/2016 Document Reviewed: 10/14/2015 Elsevier Interactive Patient Education  2017 [place] Prevention in the Home Falls can cause injuries. They can happen to people of all ages. There are many things you can do to make your home safe and to help prevent falls. What can I do on the outside of my home? Regularly fix the edges of walkways and driveways and fix any cracks. Remove anything that might make you trip as you walk through a door, such as a raised step or threshold. Trim any bushes or trees on the path to your home. Use bright outdoor lighting. Clear any walking paths of anything that might make someone trip, such as rocks or tools. Regularly check to see if handrails are loose or broken. Make sure that both sides of any steps have handrails. Any raised decks and porches should have guardrails on the edges. Have any leaves, snow, or ice cleared regularly. Use sand or salt on walking paths during winter. Clean up any spills in your garage right away. This includes oil or grease spills. What can I do in the bathroom? Use night lights. Install grab bars by the toilet and in the tub and shower. Do not use towel bars as grab bars. Use non-skid mats or  decals in the tub or shower. If you need to sit down in the shower, use a plastic, non-slip stool. Keep the floor dry. Clean up any water that spills on the floor as soon as it happens. Remove soap buildup in the tub or shower regularly. Attach bath mats securely with double-sided non-slip rug tape. Do not have throw rugs and other things on the floor that can make you trip. What can I do in the bedroom? Use night lights. Make sure that you have a light by your bed that is easy to reach. Do not use any sheets or blankets that are too big for your bed. They should not hang down onto the floor. Have a firm chair that has side arms. You can use this for support while you get dressed. Do not have throw rugs and other things on the floor that can make you trip. What can I do in the kitchen? Clean up any spills right away. Avoid walking on wet floors. Keep items that you use a lot in easy-to-reach places. If you need to reach something above you, use a strong step stool that has a grab bar. Keep electrical cords out of the way. Do not use floor polish or wax that makes floors slippery. If you must use wax, use non-skid floor wax. Do not have throw rugs and other things on the floor that can make you trip. What can I do with  my stairs? Do not leave any items on the stairs. Make sure that there are handrails on both sides of the stairs and use them. Fix handrails that are broken or loose. Make sure that handrails are as long as the stairways. Check any carpeting to make sure that it is firmly attached to the stairs. Fix any carpet that is loose or worn. Avoid having throw rugs at the top or bottom of the stairs. If you do have throw rugs, attach them to the floor with carpet tape. Make sure that you have a light switch at the top of the stairs and the bottom of the stairs. If you do not have them, ask someone to add them for you. What else can I do to help prevent falls? Wear shoes that: Do not  have high heels. Have rubber bottoms. Are comfortable and fit you well. Are closed at the toe. Do not wear sandals. If you use a stepladder: Make sure that it is fully opened. Do not climb a closed stepladder. Make sure that both sides of the stepladder are locked into place. Ask someone to hold it for you, if possible. Clearly mark and make sure that you can see: Any grab bars or handrails. First and last steps. Where the edge of each step is. Use tools that help you move around (mobility aids) if they are needed. These include: Canes. Walkers. Scooters. Crutches. Turn on the lights when you go into a dark area. Replace any light bulbs as soon as they burn out. Set up your furniture so you have a clear path. Avoid moving your furniture around. If any of your floors are uneven, fix them. If there are any pets around you, be aware of where they are. Review your medicines with your doctor. Some medicines can make you feel dizzy. This can increase your chance of falling. Ask your doctor what other things that you can do to help prevent falls. This information is not intended to replace advice given to you by your health care provider. Make sure you discuss any questions you have with your health care provider. Document Released: 10/09/2009 Document Revised: 05/20/2016 Document Reviewed: 01/17/2015 Elsevier Interactive Patient Education  2017 [employer].

## 2021-11-30 ENCOUNTER — Encounter: Payer: Self-pay | Admitting: Nurse Practitioner

## 2021-11-30 ENCOUNTER — Ambulatory Visit (INDEPENDENT_AMBULATORY_CARE_PROVIDER_SITE_OTHER): Payer: Medicare Other | Admitting: Nurse Practitioner

## 2021-11-30 VITALS — BP 128/62 | HR 55 | Temp 97.3°F | Resp 20 | Ht 61.0 in | Wt 128.0 lb

## 2021-11-30 DIAGNOSIS — I2510 Atherosclerotic heart disease of native coronary artery without angina pectoris: Secondary | ICD-10-CM | POA: Diagnosis not present

## 2021-11-30 DIAGNOSIS — E039 Hypothyroidism, unspecified: Secondary | ICD-10-CM | POA: Diagnosis not present

## 2021-11-30 DIAGNOSIS — M810 Age-related osteoporosis without current pathological fracture: Secondary | ICD-10-CM | POA: Diagnosis not present

## 2021-11-30 DIAGNOSIS — I10 Essential (primary) hypertension: Secondary | ICD-10-CM | POA: Diagnosis not present

## 2021-11-30 DIAGNOSIS — E782 Mixed hyperlipidemia: Secondary | ICD-10-CM

## 2021-11-30 DIAGNOSIS — R269 Unspecified abnormalities of gait and mobility: Secondary | ICD-10-CM

## 2021-11-30 DIAGNOSIS — M21372 Foot drop, left foot: Secondary | ICD-10-CM

## 2021-11-30 DIAGNOSIS — Z6827 Body mass index (BMI) 27.0-27.9, adult: Secondary | ICD-10-CM

## 2021-11-30 DIAGNOSIS — K3184 Gastroparesis: Secondary | ICD-10-CM

## 2021-11-30 DIAGNOSIS — Z23 Encounter for immunization: Secondary | ICD-10-CM

## 2021-11-30 DIAGNOSIS — K219 Gastro-esophageal reflux disease without esophagitis: Secondary | ICD-10-CM | POA: Diagnosis not present

## 2021-11-30 DIAGNOSIS — F418 Other specified anxiety disorders: Secondary | ICD-10-CM | POA: Diagnosis not present

## 2021-11-30 DIAGNOSIS — F3341 Major depressive disorder, recurrent, in partial remission: Secondary | ICD-10-CM | POA: Diagnosis not present

## 2021-11-30 MED ORDER — ESOMEPRAZOLE MAGNESIUM 40 MG PO CPDR: DELAYED_RELEASE_CAPSULE | ORAL | 1 refills | Status: DC

## 2021-11-30 MED ORDER — OLMESARTAN MEDOXOMIL 40 MG PO TABS: 40.0000 mg | ORAL_TABLET | Freq: Every day | ORAL | 1 refills | Status: DC

## 2021-11-30 MED ORDER — AMIODARONE HCL 200 MG PO TABS
200.0000 mg | ORAL_TABLET | Freq: Every day | ORAL | 1 refills | Status: DC
Start: 2021-11-30 — End: 2022-05-31

## 2021-11-30 MED ORDER — AMLODIPINE BESYLATE 5 MG PO TABS: 5.0000 mg | ORAL_TABLET | Freq: Two times a day (BID) | ORAL | 1 refills | Status: DC

## 2021-11-30 MED ORDER — APIXABAN 2.5 MG PO TABS: ORAL_TABLET | ORAL | 5 refills | Status: DC

## 2021-11-30 MED ORDER — ESCITALOPRAM OXALATE 20 MG PO TABS: 40.0000 mg | ORAL_TABLET | Freq: Every day | ORAL | 1 refills | Status: DC

## 2021-11-30 MED ORDER — LEVOTHYROXINE SODIUM 88 MCG PO TABS: ORAL_TABLET | ORAL | 1 refills | Status: DC

## 2021-11-30 MED ORDER — FUROSEMIDE 20 MG PO TABS: 20.0000 mg | ORAL_TABLET | Freq: Every day | ORAL | 1 refills | Status: DC

## 2021-11-30 MED ORDER — ATORVASTATIN CALCIUM 10 MG PO TABS: ORAL_TABLET | ORAL | 1 refills | Status: DC

## 2021-11-30 NOTE — Patient Instructions (Signed)

## 2021-11-30 NOTE — Progress Notes (Signed)
Subjective:    Patient ID: Erica [name], female    DOB: April 02, 1930, 87 y.o.   MRN: [id number]   Chief Complaint: medical management of chronic issues     HPI:  1. Primary hypertension No c/o chest pain, sob or headache. Does not check blood pressure at home. BP Readings from Last 3 Encounters:  11/30/21 128/62  10/26/21 (!) 154/60  05/29/21 128/63      2. Gastroesophageal reflux disease without esophagitis Is on nexium daily which usually works well.  3. Gastroparesis Has poor appetite due  to this.   4. Acquired hypothyroidism No problems that aware of. Lab Results  Component Value Date   TSH 0.620 05/29/2021     5. Mixed hyperlipidemia Does not really watch diet and is not able  to do much exercise because she is afraid she will fall. Lab Results  Component Value Date   CHOL 175 05/29/2021   HDL 54 05/29/2021   LDLCALC 99 05/29/2021   TRIG 126 05/29/2021   CHOLHDL 3.2 05/29/2021     6. Recurrent major depressive disorder, in partial remission ([place]) Is is currently on lexapro and is doing ok. Depression screen [facility] 2/9 11/30/2021 11/23/2021 05/29/2021  Decreased Interest 2 0 0  Down, Depressed, Hopeless 0 0 0  PHQ - 2 Score 2 0 0  Altered sleeping 0 - -  Tired, decreased energy 2 - -  Change in appetite 0 - -  Feeling bad or failure about yourself  0 - -  Trouble concentrating 0 - -  Moving slowly or fidgety/restless 0 - -  Suicidal thoughts 0 - -  PHQ-9 Score 4 - -  Difficult doing work/chores Not difficult at all - -  Some recent data might be hidden      7. Anxiety about health Just uses the lexapro. GAD 7 : Generalized Anxiety Score 11/30/2021 05/18/2021 02/25/2020  Nervous, Anxious, on Edge 0 0 1  Control/stop worrying 0 0 1  Worry too much - different things 1 0 1  Trouble relaxing 0 0 0  Restless 0 0 0  Easily annoyed or irritable 0 0 0  Afraid - awful might happen 0 0 0  Total GAD 7 Score 1 0 3  Anxiety Difficulty Not difficult at all  Not difficult at all -       8. Abnormality of gait 9. Left foot drop Has foot drop which can cause her to los eher balance at times. She has not had any recent falls.  10. Age-related osteoporosis without current pathological fracture Last bone density was several years ago. Patient no longer wants to do them.  11. BMI 27.0-27.9,adult No recent weight changes Wt Readings from Last 3 Encounters:  11/30/21 128 lb (58.1 kg)  11/23/21 125 lb (56.7 kg)  10/26/21 124 lb 8 oz (56.5 kg)   BMI Readings from Last 3 Encounters:  11/30/21 24.19 kg/m  11/23/21 23.62 kg/m  10/26/21 23.52 kg/m       Outpatient Encounter Medications as of 11/30/2021  Medication Sig   alendronate (FOSAMAX) 70 MG tablet Take 1 tablet (70 mg total) by mouth every 7 (seven) days. Take with a full glass of water on an empty stomach.   amiodarone (PACERONE) 200 MG tablet Take 1 tablet (200 mg total) by mouth daily. (Patient not taking: Reported on 11/23/2021)   amiodarone (PACERONE) 200 MG tablet Take by mouth. (Patient not taking: Reported on 11/23/2021)   amLODipine (NORVASC) 5 MG tablet TAKE 1  TABLET 2 TIMES A DAY  (NEEDS TO BE SEEN BEFORE NEXT REFILL) (Patient not taking: Reported on 11/23/2021)   amLODipine (NORVASC) 5 MG tablet Take 1 tablet by mouth 2 (two) times daily.   apixaban (ELIQUIS) 2.5 MG TABS tablet TAKE (1) TABLET TWICE A DAY.   atorvastatin (LIPITOR) 10 MG tablet TAKE 1 TABLET ONCE DAILY AT 6PM  (NEEDS TO BE SEEN BEFORE NEXT REFILL)   Cholecalciferol (VITAMIN D3) 125 MCG (5000 UT) CAPS Take 1 capsule by mouth every other day.   doxycycline (VIBRA-TABS) 100 MG tablet Take 1 tablet (100 mg total) by mouth 2 (two) times daily. 1 po bid   escitalopram (LEXAPRO) 20 MG tablet Take 2 tablets (40 mg total) by mouth daily. (NEEDS TO BE SEEN BEFORE NEXT REFILL)   esomeprazole (NEXIUM) 40 MG capsule TAKE 1 CAPSULE 2 TIMES A DAY BEFORE MEALS  (NEEDS TO BE SEEN BEFORE NEXT REFILL)   furosemide (LASIX) 20  MG tablet Take 1 tablet (20 mg total) by mouth daily. (NEEDS TO BE SEEN BEFORE NEXT REFILL)   hydrocortisone (ANUSOL-HC) 2.5 % rectal cream Place 1 application rectally 2 (two) times daily. (Patient taking differently: Place 1 application rectally 2 (two) times daily as needed for hemorrhoids or anal itching.)   hydrocortisone 2.5 % cream Place rectally.   levothyroxine (SYNTHROID) 88 MCG tablet TAKE (1) TABLET DAILY BE- FORE BREAKFAST.   Melatonin 5 MG CAPS Take 5 mg by mouth at bedtime as needed (sleep).   Multiple Vitamin (MULTIVITAMIN WITH MINERALS) TABS tablet Take 1 tablet by mouth daily.   olmesartan (BENICAR) 40 MG tablet Take 1 tablet (40 mg total) by mouth daily.   olmesartan (BENICAR) 40 MG tablet Take 1 tablet by mouth every morning.   ondansetron (ZOFRAN) 4 MG tablet Take 1 tablet (4 mg total) by mouth every 8 (eight) hours as needed for nausea or vomiting.   Pediatric Multiple Vitamins (FLINTSTONES PLUS EXTRA C) CHEW Chew by mouth.   Wheat Dextrin (BENEFIBER) POWD Take 1 Dose by mouth daily. Take daily   No facility-administered encounter medications on file as of 11/30/2021.    Past Surgical History:  Procedure Laterality Date   CARDIAC CATHETERIZATION N/A 02/03/2016   Procedure: Coronary/Graft Angiography;  Surgeon: Charolette Forward, MD;  Location: [facility];  Service: Cardiovascular;  Laterality: N/A;   CATARACT EXTRACTION     CHOLECYSTECTOMY     COLONOSCOPY     Multiple, polyps   ESOPHAGEAL MANOMETRY N/A 06/14/2016   Procedure: ESOPHAGEAL MANOMETRY (EM);  Surgeon: Gatha Mayer, MD;  Location: [facility];  Service: Endoscopy;  Laterality: N/A;   LEFT HEART CATHETERIZATION WITH CORONARY ANGIOGRAM N/A 07/17/2013   Procedure: LEFT HEART CATHETERIZATION WITH CORONARY ANGIOGRAM;  Surgeon: Clent Demark, MD;  Location: [facility];  Service: Cardiovascular;  Laterality: N/A;   SIGMOIDOSCOPY  08/23/2011   TUBAL LIGATION     UPPER GASTROINTESTINAL ENDOSCOPY  08/23/2011    Normal    Family History  Problem Relation Age of Onset   Stomach cancer Brother    Colon cancer Paternal Aunt    Lung cancer Brother        smoker   Esophageal cancer Father    Stroke Mother     New complaints: None today  Social history: Lives by herself  Controlled substance contract: n/a     Review of Systems  Constitutional:  Negative for diaphoresis.  Eyes:  Negative for pain.  Respiratory:  Negative for shortness of breath.  Cardiovascular:  Negative for chest pain, palpitations and leg swelling.  Gastrointestinal:  Negative for abdominal pain.  Endocrine: Negative for polydipsia.  Skin:  Negative for rash.  Neurological:  Negative for dizziness, weakness and headaches.  Hematological:  Does not bruise/bleed easily.  All other systems reviewed and are negative.     Objective:   Physical Exam Vitals and nursing note reviewed.  Constitutional:      General: She is not in acute distress.    Appearance: Normal appearance. She is well-developed.  HENT:     Head: Normocephalic.     Right Ear: Tympanic membrane normal.     Left Ear: Tympanic membrane normal.     Nose: Nose normal.     Mouth/Throat:     Mouth: Mucous membranes are moist.  Eyes:     Pupils: Pupils are equal, round, and reactive to light.  Neck:     Vascular: No carotid bruit or JVD.  Cardiovascular:     Rate and Rhythm: Normal rate and regular rhythm.     Heart sounds: Murmur (3/6 systolic murmur) heard.  Pulmonary:     Effort: Pulmonary effort is normal. No respiratory distress.     Breath sounds: Normal breath sounds. No wheezing or rales.  Chest:     Chest wall: No tenderness.  Abdominal:     General: Bowel sounds are normal. There is no distension or abdominal bruit.     Palpations: Abdomen is soft. There is no hepatomegaly, splenomegaly, mass or pulsatile mass.     Tenderness: There is no abdominal tenderness.  Musculoskeletal:        General: Normal range of motion.      Cervical back: Normal range of motion and neck supple.  Lymphadenopathy:     Cervical: No cervical adenopathy.  Skin:    General: Skin is warm and dry.  Neurological:     Mental Status: She is alert and oriented to person, place, and time.     Deep Tendon Reflexes: Reflexes are normal and symmetric.  Psychiatric:        Behavior: Behavior normal.        Thought Content: Thought content normal.        Judgment: Judgment normal.    BP 128/62   Pulse (!) 55   Temp (!) 97.3 F (36.3 C) (Temporal)   Resp 20   Ht 5\' 1"  (1.549 m)   Wt 128 lb (58.1 kg)   SpO2 97%   BMI 24.19 kg/m        Assessment & Plan:   Erica [name] comes in today with chief complaint of No chief complaint on file.   Diagnosis and orders addressed:  1. Primary hypertension Low sodium diet - furosemide (LASIX) 20 MG tablet; Take 1 tablet (20 mg total) by mouth daily. (NEEDS TO BE SEEN BEFORE NEXT REFILL)  Dispense: 90 tablet; Refill: 1 - olmesartan (BENICAR) 40 MG tablet; Take 1 tablet (40 mg total) by mouth daily.  Dispense: 90 tablet; Refill: 1  2. Gastroesophageal reflux disease without esophagitis Avoid spicy foods Do not eat 2 hours prior to bedtime - esomeprazole (NEXIUM) 40 MG capsule; TAKE 1 CAPSULE 2 TIMES A DAY BEFORE MEALS  (NEEDS TO BE SEEN BEFORE NEXT REFILL)  Dispense: 180 capsule; Refill: 1  3. Gastroparesis Eat 6 small meals a day  4. Acquired hypothyroidism Labs pending - levothyroxine (SYNTHROID) 88 MCG tablet; TAKE (1) TABLET DAILY BE- FORE BREAKFAST.  Dispense: 90 tablet; Refill: 1  5. Mixed hyperlipidemia Low fat diet - atorvastatin (LIPITOR) 10 MG tablet; TAKE 1 TABLET ONCE DAILY AT 6PM  (NEEDS TO BE SEEN BEFORE NEXT REFILL)  Dispense: 90 tablet; Refill: 1  6. Recurrent major depressive disorder, in partial remission ([facility]) Stress management - escitalopram (LEXAPRO) 20 MG tablet; Take 2 tablets (40 mg total) by mouth daily. (NEEDS TO BE SEEN BEFORE NEXT REFILL)  Dispense:  180 tablet; Refill: 1  7. Anxiety about health  8. Abnormality of gait Fall precautions  9. Left foot drop Again fall precautions  10. Age-related osteoporosis without current pathological fracture  11. BMI 27.0-27.9,adult Discussed diet and exercise for person with BMI >25 Will recheck weight in 3-6 months   12. Coronary artery disease involving native coronary artery of native heart without angina pectoris Keep yearly appointment with cardiology - amiodarone (PACERONE) 200 MG tablet; Take 1 tablet (200 mg total) by mouth daily.  Dispense: 90 tablet; Refill: 1 - apixaban (ELIQUIS) 2.5 MG TABS tablet; TAKE (1) TABLET TWICE A DAY.  Dispense: 60 tablet; Refill: 5   Labs pending Health Maintenance reviewed Diet and exercise encouraged  Follow up plan: 6 months   Mary-Margaret Hassell Done, [name]

## 2021-12-01 LAB — CMP14+EGFR
ALT: 42 IU/L — ABNORMAL HIGH (ref 0–32)
AST: 40 IU/L (ref 0–40)
Albumin/Globulin Ratio: 1.7 (ref 1.2–2.2)
Albumin: 3.9 g/dL (ref 3.6–4.6)
Alkaline Phosphatase: 51 IU/L (ref 44–121)
BUN/Creatinine Ratio: 17 (ref 12–28)
BUN: 14 mg/dL (ref 8–27)
Bilirubin Total: 0.3 mg/dL (ref 0.0–1.2)
CO2: 24 mmol/L (ref 20–29)
Calcium: 9.2 mg/dL (ref 8.7–10.3)
Chloride: 101 mmol/L (ref 96–106)
Creatinine, Ser: 0.84 mg/dL (ref 0.57–1.00)
Globulin, Total: 2.3 g/dL (ref 1.5–4.5)
Glucose: 94 mg/dL (ref 70–99)
Potassium: 5.3 mmol/L — ABNORMAL HIGH (ref 3.5–5.2)
Sodium: 140 mmol/L (ref 134–144)
Total Protein: 6.2 g/dL (ref 6.0–8.5)
eGFR: 66 mL/min/{1.73_m2} (ref >59)

## 2021-12-01 LAB — CBC WITH DIFFERENTIAL/PLATELET
Basophils Absolute: 0.1 10*3/uL (ref 0.0–0.2)
Basos: 1 %
EOS (ABSOLUTE): 0.1 10*3/uL (ref 0.0–0.4)
Eos: 1 %
Hematocrit: 38.7 % (ref 34.0–46.6)
Hemoglobin: 12.8 g/dL (ref 11.1–15.9)
Immature Grans (Abs): 0 10*3/uL (ref 0.0–0.1)
Immature Granulocytes: 0 %
Lymphocytes Absolute: 2.8 10*3/uL (ref 0.7–3.1)
Lymphs: 28 %
MCH: 31.1 pg (ref 26.6–33.0)
MCHC: 33.1 g/dL (ref 31.5–35.7)
MCV: 94 fL (ref 79–97)
Monocytes Absolute: 0.9 10*3/uL (ref 0.1–0.9)
Monocytes: 9 %
Neutrophils Absolute: 6.3 10*3/uL (ref 1.4–7.0)
Neutrophils: 61 %
Platelets: 199 10*3/uL (ref 150–450)
RBC: 4.12 x10E6/uL (ref 3.77–5.28)
RDW: 13.1 % (ref 11.7–15.4)
WBC: 10.2 10*3/uL (ref 3.4–10.8)

## 2021-12-01 LAB — LIPID PANEL
Chol/HDL Ratio: 3.2 ratio (ref 0.0–4.4)
Cholesterol, Total: 178 mg/dL (ref 100–199)
HDL: 55 mg/dL (ref >39)
LDL Chol Calc (NIH): 102 mg/dL — ABNORMAL HIGH (ref 0–99)
Triglycerides: 118 mg/dL (ref 0–149)
VLDL Cholesterol Cal: 21 mg/dL (ref 5–40)

## 2021-12-01 LAB — THYROID PANEL WITH TSH
Free Thyroxine Index: 4 (ref 1.2–4.9)
T3 Uptake Ratio: 37 % (ref 24–39)
T4, Total: 10.8 ug/dL (ref 4.5–12.0)
TSH: 1.48 u[IU]/mL (ref 0.450–4.500)

## 2021-12-04 ENCOUNTER — Telehealth: Payer: Self-pay | Admitting: Nurse Practitioner

## 2021-12-04 ENCOUNTER — Other Ambulatory Visit: Payer: Self-pay

## 2021-12-04 DIAGNOSIS — E875 Hyperkalemia: Secondary | ICD-10-CM

## 2021-12-04 NOTE — Telephone Encounter (Signed)
Patient notified and verbalized understanding of lab results. Order placed for repeat potassium. Patient will come in Monday to have repeated

## 2022-01-15 ENCOUNTER — Encounter: Payer: Self-pay | Admitting: Nurse Practitioner

## 2022-01-15 ENCOUNTER — Ambulatory Visit (INDEPENDENT_AMBULATORY_CARE_PROVIDER_SITE_OTHER): Payer: Medicare Other | Admitting: Nurse Practitioner

## 2022-01-15 VITALS — BP 141/64 | HR 57 | Temp 97.7°F | Resp 20 | Ht 61.0 in | Wt 128.0 lb

## 2022-01-15 DIAGNOSIS — R300 Dysuria: Secondary | ICD-10-CM | POA: Diagnosis not present

## 2022-01-15 DIAGNOSIS — N3000 Acute cystitis without hematuria: Secondary | ICD-10-CM

## 2022-01-15 DIAGNOSIS — E875 Hyperkalemia: Secondary | ICD-10-CM | POA: Diagnosis not present

## 2022-01-15 LAB — URINALYSIS, COMPLETE
Bilirubin, UA: NEGATIVE
Glucose, UA: NEGATIVE
Nitrite, UA: NEGATIVE
Protein,UA: NEGATIVE
Specific Gravity, UA: 1.015 (ref 1.005–1.030)
Urobilinogen, Ur: 0.2 mg/dL (ref 0.2–1.0)
pH, UA: 6 (ref 5.0–7.5)

## 2022-01-15 LAB — MICROSCOPIC EXAMINATION: WBC, UA: >30 /hpf — AB (ref 0–5)

## 2022-01-15 MED ORDER — CIPROFLOXACIN HCL 500 MG PO TABS: 500.0000 mg | ORAL_TABLET | Freq: Two times a day (BID) | ORAL | 0 refills | Status: DC

## 2022-01-15 NOTE — Patient Instructions (Signed)

## 2022-01-15 NOTE — Addendum Note (Signed)
Addended by: [name] on: 01/15/2022 02:47 PM   Modules accepted: Orders

## 2022-01-15 NOTE — Progress Notes (Addendum)
° °  Subjective:    Patient ID: MADORA BARLETTA, female    DOB: 06/30/1930, 88 y.o.   MRN: [id number]  Chief Complaint: Dysuria and Urinary Retention   Dysuria  This is a new problem. The current episode started in the past 7 days. The problem occurs every urination. The quality of the pain is described as burning. The pain is at a severity of 4/10. The pain is mild. There has been no fever. She is Not sexually active. There is No history of pyelonephritis. Associated symptoms include frequency, hesitancy and urgency. Pertinent negatives include no chills or flank pain. She has tried nothing for the symptoms. The treatment provided no relief.      Review of Systems  Constitutional:  Negative for chills.  Genitourinary:  Positive for dysuria, frequency, hesitancy and urgency. Negative for flank pain.      Objective:   Physical Exam Vitals reviewed.  Constitutional:      Appearance: Normal appearance.  Cardiovascular:     Rate and Rhythm: Normal rate and regular rhythm.     Heart sounds: Normal heart sounds.  Pulmonary:     Effort: Pulmonary effort is normal.     Breath sounds: Normal breath sounds.  Abdominal:     Tenderness: There is no right CVA tenderness or left CVA tenderness.  Skin:    General: Skin is warm and dry.  Neurological:     General: No focal deficit present.     Mental Status: She is alert and oriented to person, place, and time.  Psychiatric:        Mood and Affect: Mood normal.        Behavior: Behavior normal.   BP (!) 141/64    Pulse (!) 57    Temp 97.7 F (36.5 C) (Temporal)    Resp 20    Ht 5\' 1"  (1.549 m)    Wt 128 lb (58.1 kg)    SpO2 98%    BMI 24.19 kg/m    UA- 3+ leuks     Assessment & Plan:  TANAISHA PITTMAN in today with chief complaint of Dysuria and Urinary Retention   1. Dysuria - Urinalysis, Complete  2. Acute cystitis without hematuria Take medication as prescribe Cotton underwear Take shower not bath Cranberry juice,  yogurt Force fluids AZO over the counter X2 days Culture pending RTO prn  - ciprofloxacin (CIPRO) 500 MG tablet; Take 1 tablet (500 mg total) by mouth 2 (two) times daily.  Dispense: 14 tablet; Refill: 0 - Urine Culture  Hyperkalemia when labs were checked and needs rechecked  The above assessment and management plan was discussed with the patient. The patient verbalized understanding of and has agreed to the management plan. Patient is aware to call the clinic if symptoms persist or worsen. Patient is aware when to return to the clinic for a follow-up visit. Patient educated on when it is appropriate to go to the emergency department.   Mary-Margaret Hassell Done, [name]

## 2022-01-16 LAB — BMP8+EGFR
BUN/Creatinine Ratio: 17 (ref 12–28)
BUN: 14 mg/dL (ref 8–27)
CO2: 26 mmol/L (ref 20–29)
Calcium: 9.5 mg/dL (ref 8.7–10.3)
Chloride: 101 mmol/L (ref 96–106)
Creatinine, Ser: 0.84 mg/dL (ref 0.57–1.00)
Glucose: 86 mg/dL (ref 70–99)
Potassium: 5.7 mmol/L — ABNORMAL HIGH (ref 3.5–5.2)
Sodium: 139 mmol/L (ref 134–144)
eGFR: 66 mL/min/{1.73_m2} (ref >59)

## 2022-01-18 ENCOUNTER — Other Ambulatory Visit: Payer: Self-pay

## 2022-01-18 DIAGNOSIS — E875 Hyperkalemia: Secondary | ICD-10-CM

## 2022-01-19 LAB — URINE CULTURE

## 2022-01-21 ENCOUNTER — Other Ambulatory Visit: Payer: Medicare Other

## 2022-01-21 DIAGNOSIS — E875 Hyperkalemia: Secondary | ICD-10-CM

## 2022-01-22 LAB — BMP8+EGFR
BUN/Creatinine Ratio: 18 (ref 12–28)
BUN: 15 mg/dL (ref 8–27)
CO2: 18 mmol/L — ABNORMAL LOW (ref 20–29)
Calcium: 8.7 mg/dL (ref 8.7–10.3)
Chloride: 103 mmol/L (ref 96–106)
Creatinine, Ser: 0.84 mg/dL (ref 0.57–1.00)
Glucose: 99 mg/dL (ref 70–99)
Potassium: 4.2 mmol/L (ref 3.5–5.2)
Sodium: 137 mmol/L (ref 134–144)
eGFR: 66 mL/min/{1.73_m2} (ref >59)

## 2022-02-17 DIAGNOSIS — Z1231 Encounter for screening mammogram for malignant neoplasm of breast: Secondary | ICD-10-CM | POA: Diagnosis not present

## 2022-02-17 DIAGNOSIS — L292 Pruritus vulvae: Secondary | ICD-10-CM | POA: Diagnosis not present

## 2022-02-17 DIAGNOSIS — Z01419 Encounter for gynecological examination (general) (routine) without abnormal findings: Secondary | ICD-10-CM | POA: Diagnosis not present

## 2022-03-13 ENCOUNTER — Other Ambulatory Visit: Payer: Self-pay | Admitting: Nurse Practitioner

## 2022-03-22 ENCOUNTER — Other Ambulatory Visit: Payer: Self-pay | Admitting: Nurse Practitioner

## 2022-03-30 DIAGNOSIS — I10 Essential (primary) hypertension: Secondary | ICD-10-CM | POA: Diagnosis not present

## 2022-03-30 DIAGNOSIS — E785 Hyperlipidemia, unspecified: Secondary | ICD-10-CM | POA: Diagnosis not present

## 2022-03-30 DIAGNOSIS — E039 Hypothyroidism, unspecified: Secondary | ICD-10-CM | POA: Diagnosis not present

## 2022-03-30 DIAGNOSIS — I480 Paroxysmal atrial fibrillation: Secondary | ICD-10-CM | POA: Diagnosis not present

## 2022-03-30 DIAGNOSIS — I2510 Atherosclerotic heart disease of native coronary artery without angina pectoris: Secondary | ICD-10-CM | POA: Diagnosis not present

## 2022-04-09 ENCOUNTER — Ambulatory Visit (INDEPENDENT_AMBULATORY_CARE_PROVIDER_SITE_OTHER): Payer: Medicare Other | Admitting: Family Medicine

## 2022-04-09 ENCOUNTER — Encounter: Payer: Self-pay | Admitting: Family Medicine

## 2022-04-09 VITALS — BP 143/70 | HR 98 | Temp 97.3°F | Resp 20 | Ht 61.0 in | Wt 126.0 lb

## 2022-04-09 DIAGNOSIS — R0982 Postnasal drip: Secondary | ICD-10-CM

## 2022-04-09 DIAGNOSIS — H6123 Impacted cerumen, bilateral: Secondary | ICD-10-CM | POA: Diagnosis not present

## 2022-04-09 DIAGNOSIS — H9203 Otalgia, bilateral: Secondary | ICD-10-CM | POA: Diagnosis not present

## 2022-04-09 DIAGNOSIS — J309 Allergic rhinitis, unspecified: Secondary | ICD-10-CM

## 2022-04-09 MED ORDER — FLUTICASONE PROPIONATE 50 MCG/ACT NA SUSP: 2.0000 | Freq: Every day | NASAL | 6 refills | Status: DC

## 2022-04-09 NOTE — Progress Notes (Signed)
?  ? ?Subjective:  ?Patient ID: Erica [name], Erica    DOB: 1930/08/11, 88 y.o.   MRN: [id number] ? ?Patient Care Team: ?[name], [name] as PCP - General (Family Medicine) ?Jovita Gamma, MD as Consulting Physician (Neurosurgery)  ? ?Chief Complaint:  Ear Pain (Left ear/) ? ? ?HPI: ?Erica [name] is a 88 y.o. Erica presenting on 04/09/2022 for Ear Pain (Left ear/) ? ? ?Patient presents today with ongoing complaints of bilateral ear fullness and pain.  She also reports postnasal drainage and rhinorrhea which is causing a sore throat.  She has not been taking any medications for rhinitis.  She states her ears feel full and her hearing has decreased.  She normally wears hearing aids but does not have them in today. She reports ongoing sore throat with postnasal drainage. No fever, chills, weakness, confusion, fatigue, or malaise. Denies trouble chewing or swallowing.  ? ? ?Relevant past medical, surgical, family, and social history reviewed and updated as indicated.  ?Allergies and medications reviewed and updated. Data reviewed: Chart in Epic. ? ? ?Past Medical History:  ?Diagnosis Date  ? Abnormal transaminases 05/09/2019  ? Abnormality of gait 08/25/2016  ? Adenomatous rectal polyp   ? Anxiety about health 05/09/2019  ? Arthritis 04/03/2018  ? L hip  ? Atrial fibrillation ([place])   ? Bleeding internal hemorrhoids   ? Cataract   ? Chronic back pain   ? Depression   ? Diverticulosis of colon (without mention of hemorrhage)   ? Gastroparesis   ? GERD (gastroesophageal reflux disease)   ? Hiatal hernia 09/1999  ? 3cm  ? Hyperlipidemia   ? Hypertension   ? Hypothyroid   ? Ischemic colitis ([place])   ? Left foot drop 08/25/2016  ? Lichen sclerosus et atrophicus of the vulva   ? MVP (mitral valve prolapse)   ? Osteoporosis   ? Sleep apnea   ? TIA (transient ischemic attack)   ? Weakness   ? ? ?Past Surgical History:  ?Procedure Laterality Date  ? CARDIAC CATHETERIZATION N/A 02/03/2016  ? Procedure: Coronary/Graft  Angiography;  Surgeon: Charolette Forward, MD;  Location: [place] [facility];  Service: Cardiovascular;  Laterality: N/A;  ? CATARACT EXTRACTION    ? CHOLECYSTECTOMY    ? COLONOSCOPY    ? Multiple, polyps  ? ESOPHAGEAL MANOMETRY N/A 06/14/2016  ? Procedure: ESOPHAGEAL MANOMETRY (EM);  Surgeon: Gatha Mayer, MD;  Location: [facility];  Service: Endoscopy;  Laterality: N/A;  ? LEFT HEART CATHETERIZATION WITH CORONARY ANGIOGRAM N/A 07/17/2013  ? Procedure: LEFT HEART CATHETERIZATION WITH CORONARY ANGIOGRAM;  Surgeon: Clent Demark, MD;  Location: [facility];  Service: Cardiovascular;  Laterality: N/A;  ? SIGMOIDOSCOPY  08/23/2011  ? TUBAL LIGATION    ? UPPER GASTROINTESTINAL ENDOSCOPY  08/23/2011  ? Normal  ? ? ?Social History  ? ?Socioeconomic History  ? Marital status: Widowed  ?  Spouse name: Not on file  ? Number of children: 9  ? Years of education: Not on file  ? Highest education level: Not on file  ?Occupational History  ? Occupation: retired  ?  Employer: RETIRED  ?Tobacco Use  ? Smoking status: Never  ? Smokeless tobacco: Never  ?Vaping Use  ? Vaping Use: Never used  ?Substance and Sexual Activity  ? Alcohol use: No  ? Drug use: No  ? Sexual activity: Not on file  ?Other Topics Concern  ? Not on file  ?Social History Narrative  ? Lives at home alone.  ?  Right-handed.  ? 2 cups caffeine most days.  ? 17 grandkids  ? 21 great grand kids  ? ?Social Determinants of Health  ? ?Financial Resource Strain: Low Risk   ? Difficulty of Paying Living Expenses: Not hard at all  ?Food Insecurity: No Food Insecurity  ? Worried About Charity fundraiser in the Last Year: Never true  ? Ran Out of Food in the Last Year: Never true  ?Transportation Needs: No Transportation Needs  ? Lack of Transportation (Medical): No  ? Lack of Transportation (Non-Medical): No  ?Physical Activity: Insufficiently Active  ? Days of Exercise per Week: 3 days  ? Minutes of Exercise per Session: 30 min  ?Stress: No Stress Concern Present  ? Feeling  of Stress : Not at all  ?Social Connections: Moderately Integrated  ? Frequency of Communication with Friends and Family: More than three times a week  ? Frequency of Social Gatherings with Friends and Family: More than three times a week  ? Attends Religious Services: More than 4 times per year  ? Active Member of Clubs or Organizations: Yes  ? Attends Archivist Meetings: More than 4 times per year  ? Marital Status: Widowed  ?Intimate Partner Violence: Not At Risk  ? Fear of Current or Ex-Partner: No  ? Emotionally Abused: No  ? Physically Abused: No  ? Sexually Abused: No  ? ? ?Outpatient Encounter Medications as of 04/09/2022  ?Medication Sig  ? alendronate (FOSAMAX) 70 MG tablet TAKE 1 TABLET EVERY 7 DAYS, TAKE WITH A FULL GLASS OF WATER ON AN EMPTY STOMACH  ? amiodarone (PACERONE) 200 MG tablet Take 1 tablet (200 mg total) by mouth daily.  ? amLODipine (NORVASC) 5 MG tablet TAKE ONE TABLET BY MOUTH TWICE DAILY  ? apixaban (ELIQUIS) 2.5 MG TABS tablet TAKE (1) TABLET TWICE A DAY.  ? atorvastatin (LIPITOR) 10 MG tablet TAKE 1 TABLET ONCE DAILY AT 6PM  (NEEDS TO BE SEEN BEFORE NEXT REFILL)  ? Cholecalciferol (VITAMIN D3) 125 MCG (5000 UT) CAPS Take 1 capsule by mouth every other day.  ? escitalopram (LEXAPRO) 20 MG tablet Take 2 tablets (40 mg total) by mouth daily. (NEEDS TO BE SEEN BEFORE NEXT REFILL)  ? esomeprazole (NEXIUM) 40 MG capsule TAKE 1 CAPSULE 2 TIMES A DAY BEFORE MEALS  (NEEDS TO BE SEEN BEFORE NEXT REFILL)  ? fluticasone (FLONASE) 50 MCG/ACT nasal spray Place 2 sprays into both nostrils daily.  ? furosemide (LASIX) 20 MG tablet Take 1 tablet (20 mg total) by mouth daily. (NEEDS TO BE SEEN BEFORE NEXT REFILL)  ? hydrocortisone (ANUSOL-HC) 2.5 % rectal cream Place 1 application rectally 2 (two) times daily. (Patient taking differently: Place 1 application. rectally 2 (two) times daily as needed for hemorrhoids or anal itching.)  ? levothyroxine (SYNTHROID) 88 MCG tablet TAKE (1) TABLET  DAILY BE- FORE BREAKFAST.  ? Melatonin 5 MG CAPS Take 5 mg by mouth at bedtime as needed (sleep).  ? Multiple Vitamin (MULTIVITAMIN WITH MINERALS) TABS tablet Take 1 tablet by mouth daily.  ? olmesartan (BENICAR) 40 MG tablet Take 1 tablet (40 mg total) by mouth daily.  ? ondansetron (ZOFRAN) 4 MG tablet Take 1 tablet (4 mg total) by mouth every 8 (eight) hours as needed for nausea or vomiting.  ? Pediatric Multiple Vitamins (FLINTSTONES PLUS EXTRA C) CHEW Chew by mouth.  ? Wheat Dextrin (BENEFIBER) POWD Take 1 Dose by mouth daily. Take daily  ? [DISCONTINUED] ciprofloxacin (CIPRO) 500 MG tablet Take  1 tablet (500 mg total) by mouth 2 (two) times daily.  ? ?No facility-administered encounter medications on file as of 04/09/2022.  ? ? ?Allergies  ?Allergen Reactions  ? Contrast Media [Iodinated Contrast Media] Shortness Of Breath, Nausea Only and Swelling  ? Iodine Shortness Of Breath, Nausea Only and Swelling  ? Iohexol Hives and Swelling  ?   Desc: hives, swelling over 5 yrs ago-pt has never been pre-medicated--was told to not have iv contrast ever ?  ? Ioxaglate Nausea Only, Shortness Of Breath and Swelling  ? Bactrim [Sulfamethoxazole-Trimethoprim] Nausea And Vomiting  ? Metoclopramide Other (See Comments)  ?  Mouth tremors, insomnia, and irritability  ? ? ?Review of Systems  ?Constitutional:  Negative for activity change, appetite change, chills, diaphoresis, fatigue, fever and unexpected weight change.  ?HENT:  Positive for ear pain, hearing loss, postnasal drip, rhinorrhea and sore throat. Negative for congestion, dental problem, drooling, ear discharge, facial swelling, mouth sores, nosebleeds, sinus pressure, sinus pain, sneezing, tinnitus, trouble swallowing and voice change.   ?Eyes: Negative.  Negative for photophobia and visual disturbance.  ?Respiratory:  Negative for cough, chest tightness and shortness of breath.   ?Cardiovascular:  Negative for chest pain, palpitations and leg swelling.   ?Gastrointestinal:  Negative for abdominal pain, blood in stool, constipation, diarrhea, nausea and vomiting.  ?Endocrine: Negative.   ?Genitourinary:  Negative for decreased urine volume, difficulty urinating, dysuria,

## 2022-04-22 ENCOUNTER — Ambulatory Visit: Payer: Medicare Other | Admitting: Nurse Practitioner

## 2022-04-23 DIAGNOSIS — M2669 Other specified disorders of temporomandibular joint: Secondary | ICD-10-CM | POA: Diagnosis not present

## 2022-04-23 DIAGNOSIS — H9202 Otalgia, left ear: Secondary | ICD-10-CM | POA: Diagnosis not present

## 2022-04-23 DIAGNOSIS — Z9889 Other specified postprocedural states: Secondary | ICD-10-CM | POA: Diagnosis not present

## 2022-04-23 DIAGNOSIS — J3489 Other specified disorders of nose and nasal sinuses: Secondary | ICD-10-CM | POA: Diagnosis not present

## 2022-04-23 DIAGNOSIS — Z974 Presence of external hearing-aid: Secondary | ICD-10-CM | POA: Diagnosis not present

## 2022-04-23 DIAGNOSIS — J029 Acute pharyngitis, unspecified: Secondary | ICD-10-CM | POA: Diagnosis not present

## 2022-04-23 DIAGNOSIS — Z9089 Acquired absence of other organs: Secondary | ICD-10-CM | POA: Diagnosis not present

## 2022-04-23 DIAGNOSIS — H6123 Impacted cerumen, bilateral: Secondary | ICD-10-CM | POA: Diagnosis not present

## 2022-05-31 ENCOUNTER — Encounter: Payer: Self-pay | Admitting: Nurse Practitioner

## 2022-05-31 ENCOUNTER — Ambulatory Visit: Payer: Medicare Other | Admitting: Nurse Practitioner

## 2022-05-31 ENCOUNTER — Ambulatory Visit (INDEPENDENT_AMBULATORY_CARE_PROVIDER_SITE_OTHER): Payer: Medicare Other | Admitting: Nurse Practitioner

## 2022-05-31 VITALS — BP 140/67 | HR 54 | Temp 97.0°F | Resp 20 | Ht 61.0 in | Wt 124.0 lb

## 2022-05-31 DIAGNOSIS — F3341 Major depressive disorder, recurrent, in partial remission: Secondary | ICD-10-CM

## 2022-05-31 DIAGNOSIS — I10 Essential (primary) hypertension: Secondary | ICD-10-CM | POA: Diagnosis not present

## 2022-05-31 DIAGNOSIS — Z6827 Body mass index (BMI) 27.0-27.9, adult: Secondary | ICD-10-CM

## 2022-05-31 DIAGNOSIS — I2510 Atherosclerotic heart disease of native coronary artery without angina pectoris: Secondary | ICD-10-CM

## 2022-05-31 DIAGNOSIS — M21372 Foot drop, left foot: Secondary | ICD-10-CM

## 2022-05-31 DIAGNOSIS — E782 Mixed hyperlipidemia: Secondary | ICD-10-CM | POA: Diagnosis not present

## 2022-05-31 DIAGNOSIS — J301 Allergic rhinitis due to pollen: Secondary | ICD-10-CM | POA: Diagnosis not present

## 2022-05-31 DIAGNOSIS — F418 Other specified anxiety disorders: Secondary | ICD-10-CM

## 2022-05-31 DIAGNOSIS — K3184 Gastroparesis: Secondary | ICD-10-CM

## 2022-05-31 DIAGNOSIS — E039 Hypothyroidism, unspecified: Secondary | ICD-10-CM | POA: Diagnosis not present

## 2022-05-31 DIAGNOSIS — M810 Age-related osteoporosis without current pathological fracture: Secondary | ICD-10-CM

## 2022-05-31 DIAGNOSIS — K219 Gastro-esophageal reflux disease without esophagitis: Secondary | ICD-10-CM

## 2022-05-31 MED ORDER — ATORVASTATIN CALCIUM 10 MG PO TABS: ORAL_TABLET | ORAL | 1 refills | Status: DC

## 2022-05-31 MED ORDER — AMLODIPINE BESYLATE 5 MG PO TABS: 5.0000 mg | ORAL_TABLET | Freq: Two times a day (BID) | ORAL | 1 refills | Status: DC

## 2022-05-31 MED ORDER — LEVOTHYROXINE SODIUM 88 MCG PO TABS: ORAL_TABLET | ORAL | 1 refills | Status: DC

## 2022-05-31 MED ORDER — APIXABAN 2.5 MG PO TABS: ORAL_TABLET | ORAL | 5 refills | Status: DC

## 2022-05-31 MED ORDER — FLUTICASONE PROPIONATE 50 MCG/ACT NA SUSP: 2.0000 | Freq: Every day | NASAL | 6 refills | Status: DC

## 2022-05-31 MED ORDER — ALENDRONATE SODIUM 70 MG PO TABS: 70.0000 mg | ORAL_TABLET | ORAL | 2 refills | Status: DC

## 2022-05-31 MED ORDER — ESOMEPRAZOLE MAGNESIUM 40 MG PO CPDR: DELAYED_RELEASE_CAPSULE | ORAL | 1 refills | Status: DC

## 2022-05-31 MED ORDER — OLMESARTAN MEDOXOMIL 40 MG PO TABS: 40.0000 mg | ORAL_TABLET | Freq: Every day | ORAL | 1 refills | Status: DC

## 2022-05-31 MED ORDER — FUROSEMIDE 20 MG PO TABS: 20.0000 mg | ORAL_TABLET | Freq: Every day | ORAL | 1 refills | Status: DC

## 2022-05-31 MED ORDER — AMIODARONE HCL 200 MG PO TABS: 200.0000 mg | ORAL_TABLET | Freq: Every day | ORAL | 1 refills | Status: DC

## 2022-05-31 MED ORDER — ESCITALOPRAM OXALATE 20 MG PO TABS: 40.0000 mg | ORAL_TABLET | Freq: Every day | ORAL | 1 refills | Status: DC

## 2022-05-31 NOTE — Progress Notes (Signed)
Subjective:    Patient ID: Erica [name], female    DOB: Jan 09, 1930, 88 y.o.   MRN: [id number]   Chief Complaint: medical management of chronic issues     HPI:  Erica [name] is a 88 y.o. who identifies as a female who was assigned female at birth.   Social history: Lives with: by herself- has lots of family hat checks on her daily Work history: retired   Scientist, forensic in today for follow up of the following chronic medical issues:  1. Primary hypertension No c/o chest pain, sob or headache. Does not check blood pressure at home BP Readings from Last 3 Encounters:  04/09/22 (!) 143/70  01/15/22 (!) 141/64  11/30/21 128/62     2. Mixed hyperlipidemia Has poor appetite due to gastroparesis. Lab Results  Component Value Date   CHOL 178 11/30/2021   HDL 55 11/30/2021   LDLCALC 102 (H) 11/30/2021   TRIG 118 11/30/2021   CHOLHDL 3.2 11/30/2021     3. Gastroesophageal reflux disease without esophagitis Is on nexium daily and is doing well. Has good days and bad days.  4. Gastroparesis Has decreased appetite due to this. Stomach feels full all the time  5. Acquired hypothyroidism No issues that she is aware of. Lab Results  Component Value Date   TSH 1.480 11/30/2021     6. Anxiety about health She worries all the time about her health.    05/31/2022    2:52 PM 04/09/2022   10:58 AM 11/30/2021    3:08 PM 05/18/2021    1:11 PM  GAD 7 : Generalized Anxiety Score  Nervous, Anxious, on Edge 0 0 0 0  Control/stop worrying 0 0 0 0  Worry too much - different things 0 0 1 0  Trouble relaxing 0 0 0 0  Restless 0 0 0 0  Easily annoyed or irritable 0 0 0 0  Afraid - awful might happen 0 0 0 0  Total GAD 7 Score 0 0 1 0  Anxiety Difficulty Not difficult at all Not difficult at all Not difficult at all Not difficult at all      7. Recurrent major depressive disorder, in partial remission [name]) Patient is on lexapro and is doing well.    05/31/2022    2:52 PM  04/09/2022   10:58 AM 11/30/2021    3:08 PM  Depression screen PHQ 2/9  Decreased Interest 0 0 2  Down, Depressed, Hopeless 0 0 0  PHQ - 2 Score 0 0 2  Altered sleeping 0 0 0  Tired, decreased energy 0 0 2  Change in appetite 0 0 0  Feeling bad or failure about yourself  0 0 0  Trouble concentrating 0 0 0  Moving slowly or fidgety/restless 0 0 0  Suicidal thoughts 0 0 0  PHQ-9 Score 0 0 4  Difficult doing work/chores Not difficult at all Not difficult at all Not difficult at all    8. Left foot drop  Makes walking difficult at times. Fell 2 weeks ago trying  to get her pants down to use th ebathroom.  9. Age-related osteoporosis without current pathological fracture Last dexascan was done on  was done on 04/07/21. Her t score was -4.3. she does no weight bearing exercises.  10. BMI 27.0-27.9,adult No recent weight changes. Wt Readings from Last 3 Encounters:  05/31/22 124 lb (56.2 kg)  04/09/22 126 lb (57.2 kg)  01/15/22 128 lb (58.1 kg)   BMI  Readings from Last 3 Encounters:  05/31/22 23.43 kg/m  04/09/22 23.81 kg/m  01/15/22 24.19 kg/m      New complaints: None today  Allergies  Allergen Reactions   Contrast Media [Iodinated Contrast Media] Shortness Of Breath, Nausea Only and Swelling   Iodine Shortness Of Breath, Nausea Only and Swelling   Iohexol Hives and Swelling     Desc: hives, swelling over 5 yrs ago-pt has never been pre-medicated--was told to not have iv contrast ever    Ioxaglate Nausea Only, Shortness Of Breath and Swelling   Bactrim [Sulfamethoxazole-Trimethoprim] Nausea And Vomiting   Metoclopramide Other (See Comments)    Mouth tremors, insomnia, and irritability   Outpatient Encounter Medications as of 05/31/2022  Medication Sig   alendronate (FOSAMAX) 70 MG tablet TAKE 1 TABLET EVERY 7 DAYS, TAKE WITH A FULL GLASS OF WATER ON AN EMPTY STOMACH   amiodarone (PACERONE) 200 MG tablet Take 1 tablet (200 mg total) by mouth daily.   amLODipine  (NORVASC) 5 MG tablet TAKE ONE TABLET BY MOUTH TWICE DAILY   apixaban (ELIQUIS) 2.5 MG TABS tablet TAKE (1) TABLET TWICE A DAY.   atorvastatin (LIPITOR) 10 MG tablet TAKE 1 TABLET ONCE DAILY AT 6PM  (NEEDS TO BE SEEN BEFORE NEXT REFILL)   Cholecalciferol (VITAMIN D3) 125 MCG (5000 UT) CAPS Take 1 capsule by mouth every other day.   escitalopram (LEXAPRO) 20 MG tablet Take 2 tablets (40 mg total) by mouth daily. (NEEDS TO BE SEEN BEFORE NEXT REFILL)   esomeprazole (NEXIUM) 40 MG capsule TAKE 1 CAPSULE 2 TIMES A DAY BEFORE MEALS  (NEEDS TO BE SEEN BEFORE NEXT REFILL)   fluticasone (FLONASE) 50 MCG/ACT nasal spray Place 2 sprays into both nostrils daily.   furosemide (LASIX) 20 MG tablet Take 1 tablet (20 mg total) by mouth daily. (NEEDS TO BE SEEN BEFORE NEXT REFILL)   hydrocortisone (ANUSOL-HC) 2.5 % rectal cream Place 1 application rectally 2 (two) times daily. (Patient taking differently: Place 1 application. rectally 2 (two) times daily as needed for hemorrhoids or anal itching.)   levothyroxine (SYNTHROID) 88 MCG tablet TAKE (1) TABLET DAILY BE- FORE BREAKFAST.   Melatonin 5 MG CAPS Take 5 mg by mouth at bedtime as needed (sleep).   Multiple Vitamin (MULTIVITAMIN WITH MINERALS) TABS tablet Take 1 tablet by mouth daily.   olmesartan (BENICAR) 40 MG tablet Take 1 tablet (40 mg total) by mouth daily.   ondansetron (ZOFRAN) 4 MG tablet Take 1 tablet (4 mg total) by mouth every 8 (eight) hours as needed for nausea or vomiting.   Pediatric Multiple Vitamins (FLINTSTONES PLUS EXTRA C) CHEW Chew by mouth.   Wheat Dextrin (BENEFIBER) POWD Take 1 Dose by mouth daily. Take daily   No facility-administered encounter medications on file as of 05/31/2022.    Past Surgical History:  Procedure Laterality Date   CARDIAC CATHETERIZATION N/A 02/03/2016   Procedure: Coronary/Graft Angiography;  Surgeon: Charolette Forward, MD;  Location: [place] [facility];  Service: Cardiovascular;  Laterality: N/A;   CATARACT  EXTRACTION     CHOLECYSTECTOMY     COLONOSCOPY     Multiple, polyps   ESOPHAGEAL MANOMETRY N/A 06/14/2016   Procedure: ESOPHAGEAL MANOMETRY (EM);  Surgeon: Gatha Mayer, MD;  Location: [facility];  Service: Endoscopy;  Laterality: N/A;   LEFT HEART CATHETERIZATION WITH CORONARY ANGIOGRAM N/A 07/17/2013   Procedure: LEFT HEART CATHETERIZATION WITH CORONARY ANGIOGRAM;  Surgeon: Clent Demark, MD;  Location: [place] [facility];  Service: Cardiovascular;  Laterality:  N/A;   SIGMOIDOSCOPY  08/23/2011   TUBAL LIGATION     UPPER GASTROINTESTINAL ENDOSCOPY  08/23/2011   Normal    Family History  Problem Relation Age of Onset   Stomach cancer Brother    Colon cancer Paternal Aunt    Lung cancer Brother        smoker   Esophageal cancer Father    Stroke Mother       Controlled substance contract: n/a     Review of Systems  Constitutional:  Negative for diaphoresis.  Eyes:  Negative for pain.  Respiratory:  Negative for shortness of breath.   Cardiovascular:  Negative for chest pain, palpitations and leg swelling.  Gastrointestinal:  Negative for abdominal pain.  Endocrine: Negative for polydipsia.  Skin:  Negative for rash.  Neurological:  Negative for dizziness, weakness and headaches.  Hematological:  Does not bruise/bleed easily.  All other systems reviewed and are negative.     Objective:   Physical Exam Vitals and nursing note reviewed.  Constitutional:      General: She is not in acute distress.    Appearance: Normal appearance. She is well-developed.  HENT:     Head: Normocephalic.     Right Ear: Tympanic membrane normal.     Left Ear: Tympanic membrane normal.     Nose: Nose normal.     Mouth/Throat:     Mouth: Mucous membranes are moist.  Eyes:     Pupils: Pupils are equal, round, and reactive to light.  Neck:     Vascular: No carotid bruit or JVD.  Cardiovascular:     Rate and Rhythm: Normal rate and regular rhythm.     Heart sounds: Normal heart sounds.   Pulmonary:     Effort: Pulmonary effort is normal. No respiratory distress.     Breath sounds: Normal breath sounds. No wheezing or rales.  Chest:     Chest wall: No tenderness.  Abdominal:     General: Bowel sounds are normal. There is no distension or abdominal bruit.     Palpations: Abdomen is soft. There is no hepatomegaly, splenomegaly, mass or pulsatile mass.     Tenderness: There is no abdominal tenderness.  Musculoskeletal:        General: Normal range of motion.     Cervical back: Normal range of motion and neck supple.     Right lower leg: Edema (mild edema foot) present.     Left lower leg: Edema (mild edemaof foot) present.  Lymphadenopathy:     Cervical: No cervical adenopathy.  Skin:    General: Skin is warm and dry.  Neurological:     Mental Status: She is alert and oriented to person, place, and time.     Deep Tendon Reflexes: Reflexes are normal and symmetric.  Psychiatric:        Behavior: Behavior normal.        Thought Content: Thought content normal.        Judgment: Judgment normal.    BP 140/67   Pulse (!) 54   Temp (!) 97 F (36.1 C) (Temporal)   Resp 20   Ht 5' 1"  (1.549 m)   Wt 124 lb (56.2 kg)   SpO2 96%   BMI 23.43 kg/m        Assessment & Plan:   Erica [name] comes in today with chief complaint of Medical Management of Chronic Issues   Diagnosis and orders addressed:  1. Primary hypertension Low sodium diet -  olmesartan (BENICAR) 40 MG tablet; Take 1 tablet (40 mg total) by mouth daily.  Dispense: 90 tablet; Refill: 1 - furosemide (LASIX) 20 MG tablet; Take 1 tablet (20 mg total) by mouth daily. (NEEDS TO BE SEEN BEFORE NEXT REFILL)  Dispense: 90 tablet; Refill: 1 - amLODipine (NORVASC) 5 MG tablet; Take 1 tablet (5 mg total) by mouth 2 (two) times daily.  Dispense: 180 tablet; Refill: 1  2. Mixed hyperlipidemia Low fat diet - atorvastatin (LIPITOR) 10 MG tablet; TAKE 1 TABLET ONCE DAILY AT 6PM  (NEEDS TO BE SEEN BEFORE NEXT  REFILL)  Dispense: 90 tablet; Refill: 1  3. Gastroesophageal reflux disease without esophagitis Avoid spicy foods Do not eat 2 hours prior to bedtime - esomeprazole (NEXIUM) 40 MG capsule; TAKE 1 CAPSULE 2 TIMES A DAY BEFORE MEALS  (NEEDS TO BE SEEN BEFORE NEXT REFILL)  Dispense: 180 capsule; Refill: 1  4. Gastroparesis Eat small meals  5. Acquired hypothyroidism Labs pending - levothyroxine (SYNTHROID) 88 MCG tablet; TAKE (1) TABLET DAILY BE- FORE BREAKFAST.  Dispense: 90 tablet; Refill: 1  6. Anxiety about health Stress management  7. Recurrent major depressive disorder, in partial remission (HCC) - escitalopram (LEXAPRO) 20 MG tablet; Take 2 tablets (40 mg total) by mouth daily. (NEEDS TO BE SEEN BEFORE NEXT REFILL)  Dispense: 180 tablet; Refill: 1  8. Left foot drop Fall prevention Walk slowly to prevent fall  9. Age-related osteoporosis without current pathological fracture Weight bearing exercises - alendronate (FOSAMAX) 70 MG tablet; Take 1 tablet (70 mg total) by mouth once a week. Take with a full glass of water on an empty stomach.  Dispense: 4 tablet; Refill: 2  10. BMI 27.0-27.9,adult Discussed diet and exercise for person with BMI >25 Will recheck weight in 3-6 months   11. Coronary artery disease involving native coronary artery of native heart without angina pectoris Keep follow up with cardiology - amiodarone (PACERONE) 200 MG tablet; Take 1 tablet (200 mg total) by mouth daily.  Dispense: 90 tablet; Refill: 1 - apixaban (ELIQUIS) 2.5 MG TABS tablet; TAKE (1) TABLET TWICE A DAY.  Dispense: 60 tablet; Refill: 5  12. Seasonal allergic rhinitis due to pollen - fluticasone (FLONASE) 50 MCG/ACT nasal spray; Place 2 sprays into both nostrils daily.  Dispense: 16 g; Refill: 6  Orders Placed This Encounter  Procedures   CMP14+EGFR   CBC with Differential/Platelet   Thyroid Panel With TSH   Lipid panel      Labs pending Health Maintenance reviewed Diet and  exercise encouraged  Follow up plan: 6 months   [place], [name]

## 2022-05-31 NOTE — Patient Instructions (Signed)

## 2022-06-01 LAB — CBC WITH DIFFERENTIAL/PLATELET
Basophils Absolute: 0.1 10*3/uL (ref 0.0–0.2)
Basos: 1 %
EOS (ABSOLUTE): 0.1 10*3/uL (ref 0.0–0.4)
Eos: 1 %
Hematocrit: 38.3 % (ref 34.0–46.6)
Hemoglobin: 12.8 g/dL (ref 11.1–15.9)
Immature Grans (Abs): 0 10*3/uL (ref 0.0–0.1)
Immature Granulocytes: 0 %
Lymphocytes Absolute: 2.5 10*3/uL (ref 0.7–3.1)
Lymphs: 33 %
MCH: 30.3 pg (ref 26.6–33.0)
MCHC: 33.4 g/dL (ref 31.5–35.7)
MCV: 91 fL (ref 79–97)
Monocytes Absolute: 0.8 10*3/uL (ref 0.1–0.9)
Monocytes: 11 %
Neutrophils Absolute: 4.1 10*3/uL (ref 1.4–7.0)
Neutrophils: 54 %
Platelets: 211 10*3/uL (ref 150–450)
RBC: 4.22 x10E6/uL (ref 3.77–5.28)
RDW: 13.6 % (ref 11.7–15.4)
WBC: 7.6 10*3/uL (ref 3.4–10.8)

## 2022-06-01 LAB — LIPID PANEL
Chol/HDL Ratio: 2.3 ratio (ref 0.0–4.4)
Cholesterol, Total: 150 mg/dL (ref 100–199)
HDL: 64 mg/dL (ref >39)
LDL Chol Calc (NIH): 71 mg/dL (ref 0–99)
Triglycerides: 75 mg/dL (ref 0–149)
VLDL Cholesterol Cal: 15 mg/dL (ref 5–40)

## 2022-06-01 LAB — CMP14+EGFR
ALT: 40 IU/L — ABNORMAL HIGH (ref 0–32)
AST: 38 IU/L (ref 0–40)
Albumin/Globulin Ratio: 1.6 (ref 1.2–2.2)
Albumin: 3.9 g/dL (ref 3.6–4.6)
Alkaline Phosphatase: 51 IU/L (ref 44–121)
BUN/Creatinine Ratio: 18 (ref 12–28)
BUN: 15 mg/dL (ref 8–27)
Bilirubin Total: 0.4 mg/dL (ref 0.0–1.2)
CO2: 23 mmol/L (ref 20–29)
Calcium: 9.3 mg/dL (ref 8.7–10.3)
Chloride: 101 mmol/L (ref 96–106)
Creatinine, Ser: 0.83 mg/dL (ref 0.57–1.00)
Globulin, Total: 2.4 g/dL (ref 1.5–4.5)
Glucose: 98 mg/dL (ref 70–99)
Potassium: 5.2 mmol/L (ref 3.5–5.2)
Sodium: 138 mmol/L (ref 134–144)
Total Protein: 6.3 g/dL (ref 6.0–8.5)
eGFR: 67 mL/min/{1.73_m2} (ref >59)

## 2022-06-01 LAB — THYROID PANEL WITH TSH
Free Thyroxine Index: 4.3 (ref 1.2–4.9)
T3 Uptake Ratio: 37 % (ref 24–39)
T4, Total: 11.5 ug/dL (ref 4.5–12.0)
TSH: 0.726 u[IU]/mL (ref 0.450–4.500)

## 2022-06-30 DIAGNOSIS — E785 Hyperlipidemia, unspecified: Secondary | ICD-10-CM | POA: Diagnosis not present

## 2022-06-30 DIAGNOSIS — I10 Essential (primary) hypertension: Secondary | ICD-10-CM | POA: Diagnosis not present

## 2022-06-30 DIAGNOSIS — E039 Hypothyroidism, unspecified: Secondary | ICD-10-CM | POA: Diagnosis not present

## 2022-06-30 DIAGNOSIS — I480 Paroxysmal atrial fibrillation: Secondary | ICD-10-CM | POA: Diagnosis not present

## 2022-06-30 DIAGNOSIS — I2510 Atherosclerotic heart disease of native coronary artery without angina pectoris: Secondary | ICD-10-CM | POA: Diagnosis not present

## 2022-08-03 ENCOUNTER — Ambulatory Visit (INDEPENDENT_AMBULATORY_CARE_PROVIDER_SITE_OTHER): Payer: Medicare Other | Admitting: Nurse Practitioner

## 2022-08-03 ENCOUNTER — Encounter: Payer: Self-pay | Admitting: Nurse Practitioner

## 2022-08-03 VITALS — BP 177/69 | HR 61 | Temp 98.6°F | Ht 61.0 in | Wt 123.0 lb

## 2022-08-03 DIAGNOSIS — R300 Dysuria: Secondary | ICD-10-CM | POA: Diagnosis not present

## 2022-08-03 LAB — URINALYSIS, COMPLETE
Bilirubin, UA: NEGATIVE
Glucose, UA: NEGATIVE
Ketones, UA: NEGATIVE
Nitrite, UA: NEGATIVE
Specific Gravity, UA: 1.015 (ref 1.005–1.030)
Urobilinogen, Ur: 0.2 mg/dL (ref 0.2–1.0)
pH, UA: 6.5 (ref 5.0–7.5)

## 2022-08-03 LAB — MICROSCOPIC EXAMINATION
Renal Epithel, UA: NONE SEEN /hpf
WBC, UA: >30 /hpf — AB (ref 0–5)

## 2022-08-03 MED ORDER — CEPHALEXIN 500 MG PO CAPS: 500.0000 mg | ORAL_CAPSULE | Freq: Two times a day (BID) | ORAL | 0 refills | Status: DC

## 2022-08-03 NOTE — Patient Instructions (Signed)

## 2022-08-03 NOTE — Progress Notes (Signed)
Acute Office Visit  Subjective:     Patient ID: Erica [name], female    DOB: 02-20-1930, 88 y.o.   MRN: [id number]  Chief Complaint  Patient presents with   Urinary Tract Infection    Painful in stomach - and when she pees    skin check    Place on left leg    Urinary Tract Infection  This is a new problem. The current episode started yesterday. The problem occurs every urination. The problem has been unchanged. The quality of the pain is described as aching and burning. The pain is at a severity of 5/10. The patient is experiencing no pain. There has been no fever. She is Not sexually active. Associated symptoms include flank pain. Pertinent negatives include no chills. She has tried nothing for the symptoms.     Review of Systems  Constitutional: Negative.  Negative for chills, fever and malaise/fatigue.  HENT: Negative.    Respiratory: Negative.    Genitourinary:  Positive for flank pain.  Skin: Negative.  Negative for itching and rash.  All other systems reviewed and are negative.       Objective:    BP (!) 177/69   Pulse 61   Temp 98.6 F (37 C)   Ht '5\' 1"'$  (1.549 m)   Wt 123 lb (55.8 kg)   SpO2 95%   BMI 23.24 kg/m  BP Readings from Last 3 Encounters:  08/03/22 (!) 177/69  05/31/22 140/67  04/09/22 (!) 143/70   Wt Readings from Last 3 Encounters:  08/03/22 123 lb (55.8 kg)  05/31/22 124 lb (56.2 kg)  04/09/22 126 lb (57.2 kg)      Physical Exam Vitals and nursing note reviewed.  Constitutional:      Appearance: Normal appearance.  HENT:     Head: Normocephalic.     Right Ear: External ear normal.     Left Ear: External ear normal.     Nose: Nose normal.  Eyes:     Conjunctiva/sclera: Conjunctivae normal.  Cardiovascular:     Rate and Rhythm: Normal rate and regular rhythm.     Pulses: Normal pulses.     Heart sounds: Normal heart sounds.  Pulmonary:     Effort: Pulmonary effort is normal.     Breath sounds: Normal breath sounds.   Abdominal:     General: Bowel sounds are normal.  Musculoskeletal:        General: Normal range of motion.  Skin:    General: Skin is warm.     Findings: No rash.  Neurological:     General: No focal deficit present.     Mental Status: She is alert and oriented to person, place, and time.  Psychiatric:        Mood and Affect: Mood normal.        Behavior: Behavior normal.     No results found for any visits on 08/03/22.      Assessment & Plan:    Appears well, in no apparent distress.  Vital signs are normal. The abdomen is soft without tenderness, guarding, mass, rebound or organomegaly. Left sided CVA tenderness. No  inguinal adenopathy noted. Urine dipstick shows positive for protein and positive for leukocytes.  Micro exam: many+ bacteria.   UTI  with evidence of pyelonephritis  PLAN: Treatment per orders - also push fluids, may use Pyridium OTC prn. Call or return to clinic prn if these symptoms worsen or fail to improve as anticipated.  Problem List Items Addressed This Visit   None Visit Diagnoses     Dysuria    -  Primary   Relevant Medications   cephALEXin (KEFLEX) 500 MG capsule   Other Relevant Orders   Urine Culture   Urinalysis, Complete       Meds ordered this encounter  Medications   cephALEXin (KEFLEX) 500 MG capsule    Sig: Take 1 capsule (500 mg total) by mouth 2 (two) times daily.    Dispense:  14 capsule    Refill:  0    Order Specific Question:   Supervising Provider    Answer:   Claretta Fraise [[id number]]    Return if symptoms worsen or fail to improve.  Ivy Lynn, NP

## 2022-08-07 LAB — URINE CULTURE

## 2022-08-09 ENCOUNTER — Other Ambulatory Visit: Payer: Self-pay | Admitting: Nurse Practitioner

## 2022-08-09 DIAGNOSIS — R300 Dysuria: Secondary | ICD-10-CM

## 2022-08-09 MED ORDER — AMOXICILLIN-POT CLAVULANATE 875-125 MG PO TABS: 1.0000 | ORAL_TABLET | Freq: Two times a day (BID) | ORAL | 0 refills | Status: DC

## 2022-09-06 ENCOUNTER — Other Ambulatory Visit: Payer: Self-pay | Admitting: Nurse Practitioner

## 2022-09-06 DIAGNOSIS — M810 Age-related osteoporosis without current pathological fracture: Secondary | ICD-10-CM

## 2022-09-23 ENCOUNTER — Ambulatory Visit: Payer: Medicare Other | Admitting: *Deleted

## 2022-09-23 ENCOUNTER — Encounter: Payer: Self-pay | Admitting: *Deleted

## 2022-09-23 NOTE — Patient Instructions (Signed)
Visit Information  Thank you for taking time to visit with me today. Please don't hesitate to contact me if I can be of assistance to you.   Please call the care guide team at [phone] if you need to cancel or reschedule your appointment.   If you are experiencing a Mental Health or Behavioral Health Crisis or need someone to talk to, please call the Suicide and Crisis Lifeline: 988 call the [place] National Suicide Prevention Lifeline: 1-800-273-8255 or TTY: 1-800-799-4 TTY (1-800-799-4889) to talk to a trained counselor call 1-800-273-TALK (toll free, 24 hour hotline) go to [facility] [address], [place] ([phone]) call the [place] Crisis Line: 800-939-9988 call 911  Patient verbalizes understanding of instructions and care plan provided today and agrees to view in MyChart. Active MyChart status and patient understanding of how to access instructions and care plan via MyChart confirmed with patient.     No further follow up required.  Ezrael Sam, BSW, MSW, LCSW  Licensed Clinical Social Worker  [facility]  Mailing Address-[address], [place], [place] [address] Physical Address-[address], [place], [place] [address] Toll Free Main # [phone] Fax # [phone] Cell # [phone] [email or web address]            

## 2022-09-23 NOTE — Patient Outreach (Signed)
  Care Coordination   Initial Visit Note   09/23/2022  Name: Erica [name] MRN: [id number] DOB: December 16, 1930  Erica [name] is a 88 y.o. year old female who sees [name], [place] for primary care. I spoke with [name] by phone today.  What matters to the patients health and wellness today?  No Interventions Identified.   SDOH assessments and interventions completed:  Yes.  SDOH Interventions Today    Flowsheet Row Most Recent Value  SDOH Interventions   Food Insecurity Interventions Intervention Not Indicated  Housing Interventions Intervention Not Indicated  Transportation Interventions Intervention Not Indicated  Utilities Interventions Intervention Not Indicated  Alcohol Usage Interventions Intervention Not Indicated (Score <7)  Financial Strain Interventions Intervention Not Indicated  Physical Activity Interventions Intervention Not Indicated  Stress Interventions Intervention Not Indicated  Social Connections Interventions Intervention Not Indicated        Care Coordination Interventions Activated:  Yes.   Care Coordination Interventions:  Yes, provided.   Follow up plan: No further intervention required.   Encounter Outcome:  Pt. Visit Completed.   Nat Christen, BSW, MSW, LCSW  Licensed Education officer, [facility]  Mailing [place] [address] [place], [place] [address] Physical Address-[address] [place], [place] [address] Toll Free Main # [phone] Fax # [phone] Cell # [phone] [email or web address]

## 2022-09-28 DIAGNOSIS — E785 Hyperlipidemia, unspecified: Secondary | ICD-10-CM | POA: Diagnosis not present

## 2022-09-28 DIAGNOSIS — I10 Essential (primary) hypertension: Secondary | ICD-10-CM | POA: Diagnosis not present

## 2022-09-28 DIAGNOSIS — I2510 Atherosclerotic heart disease of native coronary artery without angina pectoris: Secondary | ICD-10-CM | POA: Diagnosis not present

## 2022-09-28 DIAGNOSIS — I480 Paroxysmal atrial fibrillation: Secondary | ICD-10-CM | POA: Diagnosis not present

## 2022-11-01 ENCOUNTER — Ambulatory Visit (INDEPENDENT_AMBULATORY_CARE_PROVIDER_SITE_OTHER): Payer: Medicare Other | Admitting: Nurse Practitioner

## 2022-11-01 ENCOUNTER — Ambulatory Visit (INDEPENDENT_AMBULATORY_CARE_PROVIDER_SITE_OTHER): Payer: Medicare Other

## 2022-11-01 ENCOUNTER — Encounter: Payer: Self-pay | Admitting: Nurse Practitioner

## 2022-11-01 VITALS — BP 123/63 | HR 53 | Temp 97.4°F | Resp 20 | Ht 61.0 in | Wt 123.0 lb

## 2022-11-01 DIAGNOSIS — I2510 Atherosclerotic heart disease of native coronary artery without angina pectoris: Secondary | ICD-10-CM

## 2022-11-01 DIAGNOSIS — M25511 Pain in right shoulder: Secondary | ICD-10-CM | POA: Diagnosis not present

## 2022-11-01 DIAGNOSIS — M898X1 Other specified disorders of bone, shoulder: Secondary | ICD-10-CM | POA: Diagnosis not present

## 2022-11-01 DIAGNOSIS — M25512 Pain in left shoulder: Secondary | ICD-10-CM | POA: Diagnosis not present

## 2022-11-01 MED ORDER — DICLOFENAC SODIUM 1 % EX GEL: 4.0000 g | Freq: Four times a day (QID) | CUTANEOUS | 0 refills | Status: DC

## 2022-11-01 NOTE — Progress Notes (Signed)
   Subjective:    Patient ID: Erica [name], female    DOB: 1930/09/11, 88 y.o.   MRN: [id number]   Chief Complaint: Pain in both shoulder blades   Patient c/o upper back pain  Back Pain This is a new problem. The current episode started more than 1 month ago. The problem occurs intermittently. The problem has been waxing and waning since onset. The pain is present in the thoracic spine. The quality of the pain is described as aching. The pain does not radiate. The pain is at a severity of 6/10. The pain is moderate. The pain is Worse during the night. The symptoms are aggravated by lying down. She has tried analgesics for the symptoms. The treatment provided mild relief.       Review of Systems  Musculoskeletal:  Positive for back pain.       Objective:   Physical Exam Vitals reviewed.  Constitutional:      Appearance: Normal appearance.  Cardiovascular:     Rate and Rhythm: Normal rate and regular rhythm.     Heart sounds: Normal heart sounds.  Pulmonary:     Breath sounds: Normal breath sounds.  Musculoskeletal:        General: Tenderness present.     Comments: Pain periscapular area bil.  Skin:    General: Skin is warm.  Neurological:     General: No focal deficit present.     Mental Status: She is alert and oriented to person, place, and time.  Psychiatric:        Mood and Affect: Mood normal.        Behavior: Behavior normal.     BP 123/63   Pulse (!) 53   Temp (!) 97.4 F (36.3 C) (Temporal)   Resp 20   Ht '5\' 1"'$  (1.549 m)   Wt 123 lb (55.8 kg)   SpO2 96%   BMI 23.24 kg/m   Periscapular xray- no acute findings-Preliminary reading by Ronnald Collum, [name]  [facility]       Assessment & Plan:  Erica [name] in today with chief complaint of Pain in both shoulder blades   1. Periscapular pain Moist heat RTO prn - diclofenac Sodium (VOLTAREN) 1 % GEL; Apply 4 g topically 4 (four) times daily.  Dispense: 350 g; Refill: 0 - DG Scapula Left; Future - DG  Scapula Right; Future    The above assessment and management plan was discussed with the patient. The patient verbalized understanding of and has agreed to the management plan. Patient is aware to call the clinic if symptoms persist or worsen. Patient is aware when to return to the clinic for a follow-up visit. Patient educated on when it is appropriate to go to the emergency department.   Mary-Margaret Hassell Done, [name]

## 2022-11-24 DIAGNOSIS — M545 Low back pain, unspecified: Secondary | ICD-10-CM | POA: Diagnosis not present

## 2022-11-24 DIAGNOSIS — E785 Hyperlipidemia, unspecified: Secondary | ICD-10-CM | POA: Diagnosis not present

## 2022-11-24 DIAGNOSIS — E039 Hypothyroidism, unspecified: Secondary | ICD-10-CM | POA: Diagnosis not present

## 2022-11-24 DIAGNOSIS — I10 Essential (primary) hypertension: Secondary | ICD-10-CM | POA: Diagnosis not present

## 2022-11-24 DIAGNOSIS — I480 Paroxysmal atrial fibrillation: Secondary | ICD-10-CM | POA: Diagnosis not present

## 2022-11-26 DIAGNOSIS — M5416 Radiculopathy, lumbar region: Secondary | ICD-10-CM | POA: Diagnosis not present

## 2022-11-26 DIAGNOSIS — M47816 Spondylosis without myelopathy or radiculopathy, lumbar region: Secondary | ICD-10-CM | POA: Diagnosis not present

## 2022-11-29 ENCOUNTER — Other Ambulatory Visit: Payer: Self-pay | Admitting: Nurse Practitioner

## 2022-11-29 DIAGNOSIS — M810 Age-related osteoporosis without current pathological fracture: Secondary | ICD-10-CM

## 2022-11-30 ENCOUNTER — Ambulatory Visit: Payer: Medicare Other | Admitting: Nurse Practitioner

## 2022-12-04 ENCOUNTER — Other Ambulatory Visit: Payer: Self-pay | Admitting: Nurse Practitioner

## 2022-12-04 DIAGNOSIS — F3341 Major depressive disorder, recurrent, in partial remission: Secondary | ICD-10-CM

## 2022-12-04 DIAGNOSIS — K219 Gastro-esophageal reflux disease without esophagitis: Secondary | ICD-10-CM

## 2022-12-04 DIAGNOSIS — I10 Essential (primary) hypertension: Secondary | ICD-10-CM

## 2022-12-04 DIAGNOSIS — E782 Mixed hyperlipidemia: Secondary | ICD-10-CM

## 2022-12-22 ENCOUNTER — Telehealth: Payer: Self-pay | Admitting: Nurse Practitioner

## 2022-12-22 DIAGNOSIS — F3341 Major depressive disorder, recurrent, in partial remission: Secondary | ICD-10-CM

## 2022-12-22 NOTE — Telephone Encounter (Signed)
  Prescription Request  12/22/2022  Is this a "Controlled Substance" medicine?   Have you seen your PCP in the last 2 weeks? Made appt for 1/29  If YES, route message to pool  -  If NO, patient needs to be scheduled for appointment.  What is the name of the medication or equipment? escitalopram (LEXAPRO) 20 MG tablet   Have you contacted your pharmacy to request a refill? yes   Which pharmacy would you like this sent to?  [place], [place]       Patient notified that their request is being sent to the clinical staff for review and that they should receive a response within 2 business days.

## 2022-12-23 ENCOUNTER — Other Ambulatory Visit: Payer: Self-pay | Admitting: Nurse Practitioner

## 2022-12-23 DIAGNOSIS — K219 Gastro-esophageal reflux disease without esophagitis: Secondary | ICD-10-CM

## 2022-12-23 DIAGNOSIS — E782 Mixed hyperlipidemia: Secondary | ICD-10-CM

## 2022-12-23 DIAGNOSIS — M810 Age-related osteoporosis without current pathological fracture: Secondary | ICD-10-CM

## 2022-12-23 DIAGNOSIS — F3341 Major depressive disorder, recurrent, in partial remission: Secondary | ICD-10-CM

## 2022-12-23 DIAGNOSIS — I10 Essential (primary) hypertension: Secondary | ICD-10-CM

## 2022-12-23 MED ORDER — ESCITALOPRAM OXALATE 20 MG PO TABS: 40.0000 mg | ORAL_TABLET | Freq: Every day | ORAL | 0 refills | Status: DC

## 2022-12-23 NOTE — Telephone Encounter (Signed)
Pt aware refill sent to pharmacy 

## 2022-12-29 DIAGNOSIS — Z23 Encounter for immunization: Secondary | ICD-10-CM | POA: Diagnosis not present

## 2023-01-10 DIAGNOSIS — I480 Paroxysmal atrial fibrillation: Secondary | ICD-10-CM | POA: Diagnosis not present

## 2023-01-10 DIAGNOSIS — I2510 Atherosclerotic heart disease of native coronary artery without angina pectoris: Secondary | ICD-10-CM | POA: Diagnosis not present

## 2023-01-10 DIAGNOSIS — E039 Hypothyroidism, unspecified: Secondary | ICD-10-CM | POA: Diagnosis not present

## 2023-01-10 DIAGNOSIS — E785 Hyperlipidemia, unspecified: Secondary | ICD-10-CM | POA: Diagnosis not present

## 2023-01-10 DIAGNOSIS — I10 Essential (primary) hypertension: Secondary | ICD-10-CM | POA: Diagnosis not present

## 2023-01-11 ENCOUNTER — Telehealth: Payer: Self-pay | Admitting: Nurse Practitioner

## 2023-01-11 NOTE — Telephone Encounter (Signed)
Left message for patient to call back and schedule Medicare Annual Wellness Visit (AWV) to be completed by video or phone.   Last AWV: 11/23/2021   Please schedule at anytime with [place]     Any questions, please contact me at [phone]   Thank you,   [facility] Ambulatory Clinical Support for [facility] ??[phone] or ??[phone]

## 2023-01-24 ENCOUNTER — Encounter: Payer: Self-pay | Admitting: Nurse Practitioner

## 2023-01-24 ENCOUNTER — Ambulatory Visit (INDEPENDENT_AMBULATORY_CARE_PROVIDER_SITE_OTHER): Payer: Medicare Other | Admitting: Nurse Practitioner

## 2023-01-24 ENCOUNTER — Other Ambulatory Visit: Payer: Self-pay | Admitting: Nurse Practitioner

## 2023-01-24 VITALS — BP 99/53 | HR 55 | Temp 98.0°F | Ht 61.0 in | Wt 122.2 lb

## 2023-01-24 DIAGNOSIS — I2510 Atherosclerotic heart disease of native coronary artery without angina pectoris: Secondary | ICD-10-CM | POA: Diagnosis not present

## 2023-01-24 DIAGNOSIS — M21372 Foot drop, left foot: Secondary | ICD-10-CM

## 2023-01-24 DIAGNOSIS — K3184 Gastroparesis: Secondary | ICD-10-CM

## 2023-01-24 DIAGNOSIS — F3341 Major depressive disorder, recurrent, in partial remission: Secondary | ICD-10-CM | POA: Diagnosis not present

## 2023-01-24 DIAGNOSIS — M26603 Bilateral temporomandibular joint disorder, unspecified: Secondary | ICD-10-CM | POA: Diagnosis not present

## 2023-01-24 DIAGNOSIS — K219 Gastro-esophageal reflux disease without esophagitis: Secondary | ICD-10-CM

## 2023-01-24 DIAGNOSIS — F418 Other specified anxiety disorders: Secondary | ICD-10-CM | POA: Diagnosis not present

## 2023-01-24 DIAGNOSIS — E782 Mixed hyperlipidemia: Secondary | ICD-10-CM

## 2023-01-24 DIAGNOSIS — E039 Hypothyroidism, unspecified: Secondary | ICD-10-CM

## 2023-01-24 DIAGNOSIS — R319 Hematuria, unspecified: Secondary | ICD-10-CM

## 2023-01-24 DIAGNOSIS — M810 Age-related osteoporosis without current pathological fracture: Secondary | ICD-10-CM

## 2023-01-24 DIAGNOSIS — I10 Essential (primary) hypertension: Secondary | ICD-10-CM | POA: Diagnosis not present

## 2023-01-24 DIAGNOSIS — Z6827 Body mass index (BMI) 27.0-27.9, adult: Secondary | ICD-10-CM

## 2023-01-24 DIAGNOSIS — S0300XD Dislocation of jaw, unspecified side, subsequent encounter: Secondary | ICD-10-CM

## 2023-01-24 MED ORDER — AMIODARONE HCL 200 MG PO TABS: 200.0000 mg | ORAL_TABLET | Freq: Every day | ORAL | 1 refills | Status: DC

## 2023-01-24 MED ORDER — ATORVASTATIN CALCIUM 10 MG PO TABS: ORAL_TABLET | ORAL | 1 refills | Status: DC

## 2023-01-24 MED ORDER — OLMESARTAN MEDOXOMIL 40 MG PO TABS: 40.0000 mg | ORAL_TABLET | Freq: Every day | ORAL | 1 refills | Status: DC

## 2023-01-24 MED ORDER — ESCITALOPRAM OXALATE 20 MG PO TABS: 40.0000 mg | ORAL_TABLET | Freq: Every day | ORAL | 1 refills | Status: DC

## 2023-01-24 MED ORDER — ALENDRONATE SODIUM 70 MG PO TABS: ORAL_TABLET | ORAL | 1 refills | Status: DC

## 2023-01-24 MED ORDER — AMLODIPINE BESYLATE 5 MG PO TABS: 5.0000 mg | ORAL_TABLET | Freq: Two times a day (BID) | ORAL | 1 refills | Status: DC

## 2023-01-24 MED ORDER — FUROSEMIDE 20 MG PO TABS: 20.0000 mg | ORAL_TABLET | Freq: Every day | ORAL | 1 refills | Status: DC

## 2023-01-24 MED ORDER — LEVOTHYROXINE SODIUM 88 MCG PO TABS: ORAL_TABLET | ORAL | 1 refills | Status: DC

## 2023-01-24 MED ORDER — APIXABAN 2.5 MG PO TABS: ORAL_TABLET | ORAL | 5 refills | Status: DC

## 2023-01-24 MED ORDER — ESOMEPRAZOLE MAGNESIUM 40 MG PO CPDR: DELAYED_RELEASE_CAPSULE | ORAL | 1 refills | Status: DC

## 2023-01-24 NOTE — Patient Instructions (Signed)

## 2023-01-24 NOTE — Progress Notes (Signed)
Subjective:    Patient ID: Erica [name], female    DOB: [birth year], 89 y.o.   MRN: [id number]   Chief Complaint: medical management of chronic issues     HPI:  Erica [name] is a 89 y.o. who identifies as a female who was assigned female at birth.   Social history: Lives with: by herself- family checks on hr daily Work history: retired   Scientist, forensic in today for follow up of the following chronic medical issues:  1. Primary hypertension No c/o chest pain, sob or headache. Doe snot check blood pressure at home. BP Readings from Last 3 Encounters:  01/24/23 (!) 99/53  11/01/22 123/63  08/03/22 (!) 177/69      2. Coronary artery disease involving native coronary artery of native heart without angina pectoris Has not seen cardiology in several years.   3. Mixed hyperlipidemia Has poor appetite. Does no dedicated exercise. Lab Results  Component Value Date   CHOL 150 05/31/2022   HDL 64 05/31/2022   LDLCALC 71 05/31/2022   TRIG 75 05/31/2022   CHOLHDL 2.3 05/31/2022     4. Gastroesophageal reflux disease without esophagitis Is on nexium daily and is doing well.  5. Gastroparesis Has poor appetite because she always feels full.  6. Acquired hypothyroidism No problems that she is aware of Lab Results  Component Value Date   TSH 0.726 05/31/2022     7. Recurrent major depressive disorder, in partial remission ([place]) Is on lexapro and is doing well.    01/24/2023    4:19 PM 11/01/2022    3:24 PM 09/23/2022    2:49 PM  Depression screen PHQ 2/9  Decreased Interest 0 0 0  Down, Depressed, Hopeless 0 0 0  PHQ - 2 Score 0 0 0  Altered sleeping 2 0   Tired, decreased energy 0 0   Change in appetite 0 0   Feeling bad or failure about yourself  0 0   Trouble concentrating 0 0   Moving slowly or fidgety/restless 0 0   Suicidal thoughts 0 0   PHQ-9 Score 2 0   Difficult doing work/chores Not difficult at all Not difficult at all       8. Anxiety about  health    01/24/2023    4:20 PM 11/01/2022    3:24 PM 05/31/2022    2:52 PM 04/09/2022   10:58 AM  GAD 7 : Generalized Anxiety Score  Nervous, Anxious, on Edge 1 0 0 0  Control/stop worrying 0 0 0 0  Worry too much - different things 1 0 0 0  Trouble relaxing 0 0 0 0  Restless 0 0 0 0  Easily annoyed or irritable 0 0 0 0  Afraid - awful might happen 1 0 0 0  Total GAD 7 Score 3 0 0 0  Anxiety Difficulty Not difficult at all Not difficult at all Not difficult at all Not difficult at all      9. Left foot drop Denies any problems . No falls  10. Age-related osteoporosis without current pathological fracture Last dexascan was done on 02/19/21. Her t score was -4.3  11. BMI 27.0-27.9,adult No recent weight changes Wt Readings from Last 3 Encounters:  01/24/23 122 lb 3.2 oz (55.4 kg)  11/01/22 123 lb (55.8 kg)  08/03/22 123 lb (55.8 kg)   BMI Readings from Last 3 Encounters:  01/24/23 23.09 kg/m  11/01/22 23.24 kg/m  08/03/22 23.24 kg/m     New  complaints: -Blood in urine- sees DR. Carmelina Noun - TMJ has worsened- is seeing specialist Allergies  Allergen Reactions   Contrast Media [Iodinated Contrast Media] Shortness Of Breath, Nausea Only and Swelling   Iodine Shortness Of Breath, Nausea Only and Swelling   Iohexol Hives and Swelling     Desc: hives, swelling over 5 yrs ago-pt has never been pre-medicated--was told to not have iv contrast ever    Ioxaglate Nausea Only, Shortness Of Breath and Swelling   Bactrim [Sulfamethoxazole-Trimethoprim] Nausea And Vomiting   Metoclopramide Other (See Comments)    Mouth tremors, insomnia, and irritability   Outpatient Encounter Medications as of 01/24/2023  Medication Sig   alendronate (FOSAMAX) 70 MG tablet TAKE ONE TABLET ONCE WEEKLY ON AN EMPTY STOMACH WITH A FULL GLASS OF WATER.   amiodarone (PACERONE) 200 MG tablet Take 1 tablet (200 mg total) by mouth daily.   amLODipine (NORVASC) 5 MG tablet Take 1 tablet (5 mg total) by mouth  2 (two) times daily.   apixaban (ELIQUIS) 2.5 MG TABS tablet TAKE (1) TABLET TWICE A DAY.   atorvastatin (LIPITOR) 10 MG tablet TAKE ONE TABLET DAILY AT 6PM   Cholecalciferol (VITAMIN D3) 125 MCG (5000 UT) CAPS Take 1 capsule by mouth every other day.   diclofenac Sodium (VOLTAREN) 1 % GEL Apply 4 g topically 4 (four) times daily.   escitalopram (LEXAPRO) 20 MG tablet Take 2 tablets (40 mg total) by mouth daily.   esomeprazole (NEXIUM) 40 MG capsule TAKE ONE CAPSULE TWICE DAILY BEFORE MEALS   fluticasone (FLONASE) 50 MCG/ACT nasal spray Place 2 sprays into both nostrils daily.   furosemide (LASIX) 20 MG tablet TAKE ONE TABLET DAILY   hydrocortisone (ANUSOL-HC) 2.5 % rectal cream Place 1 application rectally 2 (two) times daily. (Patient taking differently: Place 1 application  rectally 2 (two) times daily as needed for hemorrhoids or anal itching.)   levothyroxine (SYNTHROID) 88 MCG tablet TAKE (1) TABLET DAILY BE- FORE BREAKFAST.   Melatonin 5 MG CAPS Take 5 mg by mouth at bedtime as needed (sleep).   Multiple Vitamin (MULTIVITAMIN WITH MINERALS) TABS tablet Take 1 tablet by mouth daily.   olmesartan (BENICAR) 40 MG tablet TAKE ONE TABLET ONCE DAILY   ondansetron (ZOFRAN) 4 MG tablet Take 1 tablet (4 mg total) by mouth every 8 (eight) hours as needed for nausea or vomiting.   Pediatric Multiple Vitamins (FLINTSTONES PLUS EXTRA C) CHEW Chew by mouth.   Wheat Dextrin (BENEFIBER) POWD Take 1 Dose by mouth daily. Take daily   No facility-administered encounter medications on file as of 01/24/2023.    Past Surgical History:  Procedure Laterality Date   CARDIAC CATHETERIZATION N/A 02/03/2016   Procedure: Coronary/Graft Angiography;  Surgeon: Charolette Forward, MD;  Location: [facility];  Service: Cardiovascular;  Laterality: N/A;   CATARACT EXTRACTION     CHOLECYSTECTOMY     COLONOSCOPY     Multiple, polyps   ESOPHAGEAL MANOMETRY N/A 06/14/2016   Procedure: ESOPHAGEAL MANOMETRY (EM);   Surgeon: Gatha Mayer, MD;  Location: [facility];  Service: Endoscopy;  Laterality: N/A;   LEFT HEART CATHETERIZATION WITH CORONARY ANGIOGRAM N/A 07/17/2013   Procedure: LEFT HEART CATHETERIZATION WITH CORONARY ANGIOGRAM;  Surgeon: Clent Demark, MD;  Location: [facility];  Service: Cardiovascular;  Laterality: N/A;   SIGMOIDOSCOPY  08/23/2011   TUBAL LIGATION     UPPER GASTROINTESTINAL ENDOSCOPY  08/23/2011   Normal    Family History  Problem Relation Age of Onset  Stomach cancer Brother    Colon cancer Paternal Aunt    Lung cancer Brother        smoker   Esophageal cancer Father    Stroke Mother       Controlled substance contract: n/a     Review of Systems  Constitutional:  Negative for diaphoresis.  Eyes:  Negative for pain.  Respiratory:  Negative for shortness of breath.   Cardiovascular:  Negative for chest pain, palpitations and leg swelling.  Gastrointestinal:  Negative for abdominal pain.  Endocrine: Negative for polydipsia.  Skin:  Negative for rash.  Neurological:  Negative for dizziness, weakness and headaches.  Hematological:  Does not bruise/bleed easily.  All other systems reviewed and are negative.      Objective:   Physical Exam Vitals and nursing note reviewed.  Constitutional:      General: She is not in acute distress.    Appearance: Normal appearance. She is well-developed.  HENT:     Head: Normocephalic.     Right Ear: Tympanic membrane normal.     Left Ear: Tympanic membrane normal.     Nose: Nose normal.     Mouth/Throat:     Mouth: Mucous membranes are moist.  Eyes:     Pupils: Pupils are equal, round, and reactive to light.  Neck:     Vascular: No carotid bruit or JVD.  Cardiovascular:     Rate and Rhythm: Normal rate and regular rhythm.     Heart sounds: Normal heart sounds.  Pulmonary:     Effort: Pulmonary effort is normal. No respiratory distress.     Breath sounds: Normal breath sounds. No wheezing or rales.  Chest:      Chest wall: No tenderness.  Abdominal:     General: Bowel sounds are normal. There is no distension or abdominal bruit.     Palpations: Abdomen is soft. There is no hepatomegaly, splenomegaly, mass or pulsatile mass.     Tenderness: There is no abdominal tenderness.  Musculoskeletal:        General: Normal range of motion.     Cervical back: Normal range of motion and neck supple.  Lymphadenopathy:     Cervical: No cervical adenopathy.  Skin:    General: Skin is warm and dry.  Neurological:     Mental Status: She is alert and oriented to person, place, and time.     Deep Tendon Reflexes: Reflexes are normal and symmetric.  Psychiatric:        Behavior: Behavior normal.        Thought Content: Thought content normal.        Judgment: Judgment normal.     BP (!) 99/53   Pulse (!) 55   Temp 98 F (36.7 C) (Oral)   Ht '5\' 1"'$  (1.549 m)   Wt 122 lb 3.2 oz (55.4 kg)   SpO2 97%   BMI 23.09 kg/m        Assessment & Plan:   BEYOUNCE DICKENS comes in today with chief complaint of No chief complaint on file.   Diagnosis and orders addressed:  1. Primary hypertension Low sodium diet - furosemide (LASIX) 20 MG tablet; Take 1 tablet (20 mg total) by mouth daily.  Dispense: 90 tablet; Refill: 1 - olmesartan (BENICAR) 40 MG tablet; Take 1 tablet (40 mg total) by mouth daily.  Dispense: 90 tablet; Refill: 1 - amLODipine (NORVASC) 5 MG tablet; Take 1 tablet (5 mg total) by mouth 2 (two) times daily.  Dispense: 180 tablet; Refill: 1 - CBC with Differential/Platelet - CMP14+EGFR  2. Coronary artery disease involving native coronary artery of native heart without angina pectoris Keep follow  up with cardiology - amiodarone (PACERONE) 200 MG tablet; Take 1 tablet (200 mg total) by mouth daily.  Dispense: 90 tablet; Refill: 1 - apixaban (ELIQUIS) 2.5 MG TABS tablet; TAKE (1) TABLET TWICE A DAY.  Dispense: 60 tablet; Refill: 5  3. Mixed hyperlipidemia Lowfat diet - atorvastatin  (LIPITOR) 10 MG tablet; TAKE ONE TABLET DAILY AT 6PM  Dispense: 90 tablet; Refill: 1 - Lipid panel  4. Gastroesophageal reflux disease without esophagitis Avoid spicy foods Do not eat 2 hours prior to bedtime  - esomeprazole (NEXIUM) 40 MG capsule; TAKE ONE CAPSULE TWICE DAILY BEFORE MEALS  Dispense: 180 capsule; Refill: 1  5. Gastroparesis Eat frequent small meals  6. Acquired hypothyroidism Labs pending - levothyroxine (SYNTHROID) 88 MCG tablet; TAKE (1) TABLET DAILY BE- FORE BREAKFAST.  Dispense: 90 tablet; Refill: 1  7. Recurrent major depressive disorder, in partial remission (HCC) Stress management - escitalopram (LEXAPRO) 20 MG tablet; Take 2 tablets (40 mg total) by mouth daily.  Dispense: 180 tablet; Refill: 1  8. Anxiety about health  9. Left foot drop Fall prevention  10. Age-related osteoporosis without current pathological fracture Weight  bearing exercise - alendronate (FOSAMAX) 70 MG tablet; TAKE ONE TABLET ONCE WEEKLY ON AN EMPTY STOMACH WITH A FULL GLASS OF WATER.  Dispense: 12 tablet; Refill: 1  11. BMI 27.0-27.9,adult Discussed diet and exercise for person with BMI >25 Will recheck weight in 3-6 months   12. Hematuria, unspecified type Referral  to urology - Ambulatory referral to Urology  13. TMJ Continue  to see specialist Take teeth out at night   Labs pending Health Maintenance reviewed Diet and exercise encouraged  Follow up plan: 6 months   [place], [name]

## 2023-01-25 LAB — CBC WITH DIFFERENTIAL/PLATELET
Basophils Absolute: 0.1 10*3/uL (ref 0.0–0.2)
Basos: 1 %
EOS (ABSOLUTE): 0.1 10*3/uL (ref 0.0–0.4)
Eos: 1 %
Hematocrit: 37.1 % (ref 34.0–46.6)
Hemoglobin: 12.4 g/dL (ref 11.1–15.9)
Immature Grans (Abs): 0 10*3/uL (ref 0.0–0.1)
Immature Granulocytes: 1 %
Lymphocytes Absolute: 2.6 10*3/uL (ref 0.7–3.1)
Lymphs: 31 %
MCH: 30.9 pg (ref 26.6–33.0)
MCHC: 33.4 g/dL (ref 31.5–35.7)
MCV: 93 fL (ref 79–97)
Monocytes Absolute: 0.6 10*3/uL (ref 0.1–0.9)
Monocytes: 8 %
Neutrophils Absolute: 5 10*3/uL (ref 1.4–7.0)
Neutrophils: 58 %
Platelets: 213 10*3/uL (ref 150–450)
RBC: 4.01 x10E6/uL (ref 3.77–5.28)
RDW: 13.2 % (ref 11.7–15.4)
WBC: 8.4 10*3/uL (ref 3.4–10.8)

## 2023-01-25 LAB — CMP14+EGFR
ALT: 57 IU/L — ABNORMAL HIGH (ref 0–32)
AST: 46 IU/L — ABNORMAL HIGH (ref 0–40)
Albumin/Globulin Ratio: 2 (ref 1.2–2.2)
Albumin: 4.2 g/dL (ref 3.6–4.6)
Alkaline Phosphatase: 54 IU/L (ref 44–121)
BUN/Creatinine Ratio: 15 (ref 12–28)
BUN: 15 mg/dL (ref 10–36)
Bilirubin Total: 0.4 mg/dL (ref 0.0–1.2)
CO2: 21 mmol/L (ref 20–29)
Calcium: 9.3 mg/dL (ref 8.7–10.3)
Chloride: 102 mmol/L (ref 96–106)
Creatinine, Ser: 0.99 mg/dL (ref 0.57–1.00)
Globulin, Total: 2.1 g/dL (ref 1.5–4.5)
Glucose: 105 mg/dL — ABNORMAL HIGH (ref 70–99)
Potassium: 5.3 mmol/L — ABNORMAL HIGH (ref 3.5–5.2)
Sodium: 137 mmol/L (ref 134–144)
Total Protein: 6.3 g/dL (ref 6.0–8.5)
eGFR: 54 mL/min/{1.73_m2} — ABNORMAL LOW (ref >59)

## 2023-01-25 LAB — LIPID PANEL
Chol/HDL Ratio: 2.8 ratio (ref 0.0–4.4)
Cholesterol, Total: 164 mg/dL (ref 100–199)
HDL: 59 mg/dL (ref >39)
LDL Chol Calc (NIH): 88 mg/dL (ref 0–99)
Triglycerides: 93 mg/dL (ref 0–149)
VLDL Cholesterol Cal: 17 mg/dL (ref 5–40)

## 2023-02-06 NOTE — Patient Instructions (Incomplete)
[name] , Thank you for taking time to come for your Medicare Wellness Visit. I appreciate your ongoing commitment to your health goals. Please review the following plan we discussed and let me know if I can assist you in the future.   These are the goals we discussed:  Goals      Have 3 meals a day     Continue to eat healthy and exercise as tolerated.         This is a list of the screening recommended for you and due dates:  Health Maintenance  Topic Date Due   Zoster (Shingles) Vaccine (1 of 2) Never done   Medicare Annual Wellness Visit  11/23/2022   Flu Shot  03/27/2023*   Colon Cancer Screening  06/01/2023*   DTaP/Tdap/Td vaccine (3 - Td or Tdap) 08/22/2027   Pneumonia Vaccine  Completed   DEXA scan (bone density measurement)  Completed   COVID-19 Vaccine  Completed   HPV Vaccine  Aged Out  *Topic was postponed. The date shown is not the original due date.    Advanced directives:   Conditions/risks identified: Aim for 30 minutes of exercise or brisk walking, 6-8 glasses of water, and 5 servings of fruits and vegetables each day.   Next appointment: Follow up in one year for your annual wellness visit    Preventive Care 65 Years and Older, Female Preventive care refers to lifestyle choices and visits with your health care provider that can promote health and wellness. What does preventive care include? A yearly physical exam. This is also called an annual well check. Dental exams once or twice a year. Routine eye exams. Ask your health care provider how often you should have your eyes checked. Personal lifestyle choices, including: Daily care of your teeth and gums. Regular physical activity. Eating a healthy diet. Avoiding tobacco and drug use. Limiting alcohol use. Practicing safe sex. Taking low-dose aspirin every day. Taking vitamin and mineral supplements as recommended by your health care provider. What happens during an annual well check? The services  and screenings done by your health care provider during your annual well check will depend on your age, overall health, lifestyle risk factors, and family history of disease. Counseling  Your health care provider may ask you questions about your: Alcohol use. Tobacco use. Drug use. Emotional well-being. Home and relationship well-being. Sexual activity. Eating habits. History of falls. Memory and ability to understand (cognition). Work and work Statistician. Reproductive health. Screening  You may have the following tests or measurements: Height, weight, and BMI. Blood pressure. Lipid and cholesterol levels. These may be checked every 5 years, or more frequently if you are over 31 years old. Skin check. Lung cancer screening. You may have this screening every year starting at age 71 if you have a 30-pack-year history of smoking and currently smoke or have quit within the past 15 years. Fecal occult blood test (FOBT) of the stool. You may have this test every year starting at age 15. Flexible sigmoidoscopy or colonoscopy. You may have a sigmoidoscopy every 5 years or a colonoscopy every 10 years starting at age 69. Hepatitis C blood test. Hepatitis B blood test. Sexually transmitted disease (STD) testing. Diabetes screening. This is done by checking your blood sugar (glucose) after you have not eaten for a while (fasting). You may have this done every 1-3 years. Bone density scan. This is done to screen for osteoporosis. You may have this done starting at age 94. Mammogram.  This may be done every 1-2 years. Talk to your health care provider about how often you should have regular mammograms. Talk with your health care provider about your test results, treatment options, and if necessary, the need for more tests. Vaccines  Your health care provider may recommend certain vaccines, such as: Influenza vaccine. This is recommended every year. Tetanus, diphtheria, and acellular pertussis  (Tdap, Td) vaccine. You may need a Td booster every 10 years. Zoster vaccine. You may need this after age 76. Pneumococcal 13-valent conjugate (PCV13) vaccine. One dose is recommended after age 79. Pneumococcal polysaccharide (PPSV23) vaccine. One dose is recommended after age 77. Talk to your health care provider about which screenings and vaccines you need and how often you need them. This information is not intended to replace advice given to you by your health care provider. Make sure you discuss any questions you have with your health care provider. Document Released: 01/09/2016 Document Revised: 09/01/2016 Document Reviewed: 10/14/2015 Elsevier Interactive Patient Education  2017 [place] Prevention in the Home Falls can cause injuries. They can happen to people of all ages. There are many things you can do to make your home safe and to help prevent falls. What can I do on the outside of my home? Regularly fix the edges of walkways and driveways and fix any cracks. Remove anything that might make you trip as you walk through a door, such as a raised step or threshold. Trim any bushes or trees on the path to your home. Use bright outdoor lighting. Clear any walking paths of anything that might make someone trip, such as rocks or tools. Regularly check to see if handrails are loose or broken. Make sure that both sides of any steps have handrails. Any raised decks and porches should have guardrails on the edges. Have any leaves, snow, or ice cleared regularly. Use sand or salt on walking paths during winter. Clean up any spills in your garage right away. This includes oil or grease spills. What can I do in the bathroom? Use night lights. Install grab bars by the toilet and in the tub and shower. Do not use towel bars as grab bars. Use non-skid mats or decals in the tub or shower. If you need to sit down in the shower, use a plastic, non-slip stool. Keep the floor dry. Clean  up any water that spills on the floor as soon as it happens. Remove soap buildup in the tub or shower regularly. Attach bath mats securely with double-sided non-slip rug tape. Do not have throw rugs and other things on the floor that can make you trip. What can I do in the bedroom? Use night lights. Make sure that you have a light by your bed that is easy to reach. Do not use any sheets or blankets that are too big for your bed. They should not hang down onto the floor. Have a firm chair that has side arms. You can use this for support while you get dressed. Do not have throw rugs and other things on the floor that can make you trip. What can I do in the kitchen? Clean up any spills right away. Avoid walking on wet floors. Keep items that you use a lot in easy-to-reach places. If you need to reach something above you, use a strong step stool that has a grab bar. Keep electrical cords out of the way. Do not use floor polish or wax that makes floors slippery. If you  must use wax, use non-skid floor wax. Do not have throw rugs and other things on the floor that can make you trip. What can I do with my stairs? Do not leave any items on the stairs. Make sure that there are handrails on both sides of the stairs and use them. Fix handrails that are broken or loose. Make sure that handrails are as long as the stairways. Check any carpeting to make sure that it is firmly attached to the stairs. Fix any carpet that is loose or worn. Avoid having throw rugs at the top or bottom of the stairs. If you do have throw rugs, attach them to the floor with carpet tape. Make sure that you have a light switch at the top of the stairs and the bottom of the stairs. If you do not have them, ask someone to add them for you. What else can I do to help prevent falls? Wear shoes that: Do not have high heels. Have rubber bottoms. Are comfortable and fit you well. Are closed at the toe. Do not wear sandals. If you  use a stepladder: Make sure that it is fully opened. Do not climb a closed stepladder. Make sure that both sides of the stepladder are locked into place. Ask someone to hold it for you, if possible. Clearly mark and make sure that you can see: Any grab bars or handrails. First and last steps. Where the edge of each step is. Use tools that help you move around (mobility aids) if they are needed. These include: Canes. Walkers. Scooters. Crutches. Turn on the lights when you go into a dark area. Replace any light bulbs as soon as they burn out. Set up your furniture so you have a clear path. Avoid moving your furniture around. If any of your floors are uneven, fix them. If there are any pets around you, be aware of where they are. Review your medicines with your doctor. Some medicines can make you feel dizzy. This can increase your chance of falling. Ask your doctor what other things that you can do to help prevent falls. This information is not intended to replace advice given to you by your health care provider. Make sure you discuss any questions you have with your health care provider. Document Released: 10/09/2009 Document Revised: 05/20/2016 Document Reviewed: 01/17/2015 Elsevier Interactive Patient Education  2017 [employer].

## 2023-02-06 NOTE — Progress Notes (Deleted)
Subjective:   Erica [name] is a 89 y.o. female who presents for Medicare Annual (Subsequent) preventive examination.  Review of Systems    ***       Objective:    There were no vitals filed for this visit. There is no height or weight on file to calculate BMI.     09/23/2022    2:52 PM 11/23/2021    2:04 PM 05/17/2020    4:09 PM 04/23/2019   12:59 PM 04/03/2018    2:19 PM 08/21/2017    3:13 PM 04/21/2016    2:31 PM  Advanced Directives  Does Patient Have a Medical Advance Directive? Yes Yes Yes Yes Yes No No  Type of Paramedic of [place];Living will [place];Living will [place];Living will Living will     Does patient want to make changes to medical advance directive? No - Patient declined        Copy of [place] in Chart? No - copy requested No - copy requested       Would patient like information on creating a medical advance directive?  No - Patient declined  No - Patient declined  No - Patient declined     Current Medications (verified) Outpatient Encounter Medications as of 02/07/2023  Medication Sig   alendronate (FOSAMAX) 70 MG tablet TAKE ONE TABLET ONCE WEEKLY ON AN EMPTY STOMACH WITH A FULL GLASS OF WATER.   amiodarone (PACERONE) 200 MG tablet Take 1 tablet (200 mg total) by mouth daily.   amLODipine (NORVASC) 5 MG tablet Take 1 tablet (5 mg total) by mouth 2 (two) times daily.   apixaban (ELIQUIS) 2.5 MG TABS tablet TAKE (1) TABLET TWICE A DAY.   atorvastatin (LIPITOR) 10 MG tablet TAKE ONE TABLET DAILY AT 6PM   Cholecalciferol (VITAMIN D3) 125 MCG (5000 UT) CAPS Take 1 capsule by mouth every other day.   diclofenac Sodium (VOLTAREN) 1 % GEL Apply 4 g topically 4 (four) times daily. (Patient not taking: Reported on 01/24/2023)   escitalopram (LEXAPRO) 20 MG tablet Take 2 tablets (40 mg total) by mouth daily.   esomeprazole (NEXIUM) 40 MG capsule TAKE ONE CAPSULE TWICE DAILY BEFORE  MEALS   fluticasone (FLONASE) 50 MCG/ACT nasal spray Place 2 sprays into both nostrils daily.   furosemide (LASIX) 20 MG tablet Take 1 tablet (20 mg total) by mouth daily.   hydrocortisone (ANUSOL-HC) 2.5 % rectal cream Place 1 application rectally 2 (two) times daily. (Patient taking differently: Place 1 application  rectally 2 (two) times daily as needed for hemorrhoids or anal itching.)   levothyroxine (SYNTHROID) 88 MCG tablet TAKE (1) TABLET DAILY BE- FORE BREAKFAST.   Melatonin 5 MG CAPS Take 5 mg by mouth at bedtime as needed (sleep).   Multiple Vitamin (MULTIVITAMIN WITH MINERALS) TABS tablet Take 1 tablet by mouth daily.   olmesartan (BENICAR) 40 MG tablet Take 1 tablet (40 mg total) by mouth daily.   ondansetron (ZOFRAN) 4 MG tablet Take 1 tablet (4 mg total) by mouth every 8 (eight) hours as needed for nausea or vomiting.   Pediatric Multiple Vitamins (FLINTSTONES PLUS EXTRA C) CHEW Chew by mouth.   Wheat Dextrin (BENEFIBER) POWD Take 1 Dose by mouth daily. Take daily   No facility-administered encounter medications on file as of 02/07/2023.    Allergies (verified) Contrast media [iodinated contrast media], Iodine, Iohexol, Ioxaglate, Bactrim [sulfamethoxazole-trimethoprim], and Metoclopramide   History: Past Medical History:  Diagnosis  Date   Abnormal transaminases 05/09/2019   Abnormality of gait 08/25/2016   Adenomatous rectal polyp    Anxiety about health 05/09/2019   Arthritis 04/03/2018   L hip   Atrial fibrillation (HCC)    Bleeding internal hemorrhoids    Cataract    Chronic back pain    Depression    Diverticulosis of colon (without mention of hemorrhage)    Gastroparesis    GERD (gastroesophageal reflux disease)    Hiatal hernia 09/1999   3cm   Hyperlipidemia    Hypertension    Hypothyroid    Ischemic colitis ([place])    Left foot drop [id number]   Lichen sclerosus et atrophicus of the vulva    MVP (mitral valve prolapse)    Osteoporosis    Sleep apnea     TIA (transient ischemic attack)    Weakness    Past Surgical History:  Procedure Laterality Date   CARDIAC CATHETERIZATION N/A 02/03/2016   Procedure: Coronary/Graft Angiography;  Surgeon: Charolette Forward, MD;  Location: [facility];  Service: Cardiovascular;  Laterality: N/A;   CATARACT EXTRACTION     CHOLECYSTECTOMY     COLONOSCOPY     Multiple, polyps   ESOPHAGEAL MANOMETRY N/A 06/14/2016   Procedure: ESOPHAGEAL MANOMETRY (EM);  Surgeon: Gatha Mayer, MD;  Location: [facility];  Service: Endoscopy;  Laterality: N/A;   LEFT HEART CATHETERIZATION WITH CORONARY ANGIOGRAM N/A 07/17/2013   Procedure: LEFT HEART CATHETERIZATION WITH CORONARY ANGIOGRAM;  Surgeon: Clent Demark, MD;  Location: [facility];  Service: Cardiovascular;  Laterality: N/A;   SIGMOIDOSCOPY  08/23/2011   TUBAL LIGATION     UPPER GASTROINTESTINAL ENDOSCOPY  08/23/2011   Normal   Family History  Problem Relation Age of Onset   Stomach cancer Brother    Colon cancer Paternal Aunt    Lung cancer Brother        smoker   Esophageal cancer Father    Stroke Mother    Social History   Socioeconomic History   Marital status: Widowed    Spouse name: Not on file   Number of children: 9   Years of education: 12   Highest education level: 12th grade  Occupational History   Occupation: retired    Fish farm manager: RETIRED  Tobacco Use   Smoking status: Never    Passive exposure: Never   Smokeless tobacco: Never  Vaping Use   Vaping Use: Never used  Substance and Sexual Activity   Alcohol use: No   Drug use: No   Sexual activity: Not Currently    Partners: Male  Other Topics Concern   Not on file  Social History Narrative   Lives at home alone.   Right-handed.   2 cups caffeine most days.   80 grandkids   21 great grand kids   Social Determinants of Health   Financial Resource Strain: Low Risk  (09/23/2022)   Overall Financial Resource Strain (CARDIA)    Difficulty of Paying Living Expenses: Not hard at  all  Food Insecurity: No Food Insecurity (09/23/2022)   Hunger Vital Sign    Worried About Running Out of Food in the Last Year: Never true    Ran Out of Food in the Last Year: Never true  Transportation Needs: No Transportation Needs (09/23/2022)   PRAPARE - Hydrologist (Medical): No    Lack of Transportation (Non-Medical): No  Physical Activity: Sufficiently Active (09/23/2022)   Exercise Vital Sign  Days of Exercise per Week: 5 days    Minutes of Exercise per Session: 30 min  Stress: No Stress Concern Present (09/23/2022)   [place]    Feeling of Stress : Only a little  Social Connections: Moderately Integrated (09/23/2022)   Social Connection and Isolation Panel [NHANES]    Frequency of Communication with Friends and Family: More than three times a week    Frequency of Social Gatherings with Friends and Family: More than three times a week    Attends Religious Services: More than 4 times per year    Active Member of [employer] or Organizations: Yes    Attends Archivist Meetings: More than 4 times per year    Marital Status: Widowed    Tobacco Counseling Counseling given: Not Answered   Clinical Intake:                 Diabetic?No          Activities of Daily Living     No data to display          Patient Care Team: [name], [name] as PCP - General (Family Medicine) Jovita Gamma, MD as Consulting Physician (Neurosurgery)  Indicate any recent Medical Services you may have received from other than [facility] providers in the past year (date may be approximate).     Assessment:   This is a routine wellness examination for Lachandra.  Hearing/Vision screen No results found.  Dietary issues and exercise activities discussed:     Goals Addressed   None    Depression Screen    01/24/2023    4:19 PM 11/01/2022    3:24 PM 09/23/2022     2:49 PM 05/31/2022    2:52 PM 04/09/2022   10:58 AM 11/30/2021    3:08 PM 11/23/2021    2:00 PM  PHQ 2/9 Scores  PHQ - 2 Score 0 0 0 0 0 2 0  PHQ- 9 Score 2 0  0 0 4     Fall Risk    01/24/2023    4:19 PM 11/01/2022    3:24 PM 09/23/2022    2:51 PM 08/03/2022    3:30 PM 05/31/2022    2:52 PM  [place] in the past year? 0 0 0 0 0  Number falls in past yr: 0  0    Injury with Fall? 0  0    Risk for fall due to : No Fall Risks  No Fall Risks    Follow up Falls evaluation completed  Falls evaluation completed;Education provided;Falls prevention discussed      FALL RISK PREVENTION PERTAINING TO THE HOME:  Any stairs in or around the home? {YES/NO:21197} If so, are there any without handrails? {YES/NO:21197} Home free of loose throw rugs in walkways, pet beds, electrical cords, etc? {YES/NO:21197} Adequate lighting in your home to reduce risk of falls? {YES/NO:21197}  ASSISTIVE DEVICES UTILIZED TO PREVENT FALLS:  Life alert? {YES/NO:21197} Use of a cane, walker or w/c? {YES/NO:21197} Grab bars in the bathroom? {YES/NO:21197} Shower chair or bench in shower? {YES/NO:21197} Elevated toilet seat or a handicapped toilet? {YES/NO:21197}  TIMED UP AND GO:  Was the test performed? No . Telephonic visit   Cognitive Function:        11/23/2021    2:09 PM  6CIT Screen  What Year? 0 points  What month? 0 points  What time? 0 points  Count back from 20  0 points  Months in reverse 0 points  Repeat phrase 0 points  Total Score 0 points    Immunizations Immunization History  Administered Date(s) Administered   Covid-19, Mrna,Vaccine(Spikevax)37yr and older 12/29/2022   Fluad Quad(high Dose 65+) 10/04/2019, 10/22/2020, 11/30/2021   Influenza, High Dose Seasonal PF 10/03/2013, 09/20/2014, 09/27/2016, 10/12/2017, 10/11/2018   Influenza,inj,Quad PF,6+ Mos 10/08/2015   Influenza-Unspecified 10/17/1997, 10/11/2001, 10/08/2015   Moderna Sars-Covid-2 Vaccination 03/10/2020,  11/18/2020   Pneumococcal Conjugate-13 09/27/2016   Pneumococcal Polysaccharide-23 10/17/1997, 11/16/2013   Tdap 10/05/2011, 08/21/2017   Zoster, Live 10/03/2013    TDAP status: Up to date  {Flu Vaccine status:2101806}  Pneumococcal vaccine status: Up to date  Covid-19 vaccine status: Information provided on how to obtain vaccines.   Qualifies for Shingles Vaccine? Yes   Zostavax completed No   Shingrix Completed?: No.    Education has been provided regarding the importance of this vaccine. Patient has been advised to call insurance company to determine out of pocket expense if they have not yet received this vaccine. Advised may also receive vaccine at local pharmacy or Health Dept. Verbalized acceptance and understanding.  Screening Tests Health Maintenance  Topic Date Due   Zoster Vaccines- Shingrix (1 of 2) Never done   Medicare Annual Wellness (AWV)  11/23/2022   INFLUENZA VACCINE  03/27/2023 (Originally 07/27/2022)   DTaP/Tdap/Td (3 - Td or Tdap) 08/22/2027   Pneumonia Vaccine 89 Years old  Completed   DEXA SCAN  Completed   COVID-19 Vaccine  Completed   HPV VACCINES  Aged Out   COLONOSCOPY (Pts [phone] coverage will need to be confirmed)  Discontinued    Health Maintenance  Health Maintenance Due  Topic Date Due   Zoster Vaccines- Shingrix (1 of 2) Never done   Medicare Annual Wellness (AWV)  11/23/2022    Colorectal cancer screening: No longer required.   Mammogram status: No longer required due to age.  Bone Density status: Completed 04/02/21. Results reflect: Bone density results: OSTEOPOROSIS. Repeat every 2 years.  Lung Cancer Screening: (Low Dose CT Chest recommended if Age 87-80ears, 30 pack-year currently smoking OR have quit w/in 15years.) does not qualify.   Lung Cancer Screening Referral: n/a   Additional Screening:  Hepatitis C Screening: does not qualify  Vision Screening: Recommended annual ophthalmology exams for early detection  of glaucoma and other disorders of the eye. Is the patient up to date with their annual eye exam?  {YES/NO:21197} Who is the provider or what is the name of the office in which the patient attends annual eye exams? *** If pt is not established with a provider, would they like to be referred to a provider to establish care? {YES/NO:21197}.   Dental Screening: Recommended annual dental exams for proper oral hygiene  Community Resource Referral / Chronic Care Management: CRR required this visit?  {YES/NO:21197}  CCM required this visit?  {YES/NO:21197}     Plan:     I have personally reviewed and noted the following in the patient's chart:   Medical and social history Use of alcohol, tobacco or illicit drugs  Current medications and supplements including opioid prescriptions. Patient is not currently taking opioid prescriptions. Functional ability and status Nutritional status Physical activity Advanced directives List of other physicians Hospitalizations, surgeries, and ER visits in previous 12 months Vitals Screenings to include cognitive, depression, and falls Referrals and appointments  In addition, I have reviewed and discussed with patient certain preventive protocols, quality metrics, and best practice recommendations. A written  personalized care plan for preventive services as well as general preventive health recommendations were provided to patient.     Vanetta Mulders, [place]   [id number]   Due to this being a virtual visit, the after visit summary with patients personalized plan was offered to patient via mail or my-chart. ***Patient declined at this time./ Patient would like to access on my-chart/ per request, patient was mailed a copy of AVS./ Patient preferred to pick up at office at next visit  Nurse Notes: ***

## 2023-02-23 ENCOUNTER — Other Ambulatory Visit: Payer: Self-pay | Admitting: Nurse Practitioner

## 2023-02-23 DIAGNOSIS — M810 Age-related osteoporosis without current pathological fracture: Secondary | ICD-10-CM

## 2023-02-24 ENCOUNTER — Ambulatory Visit (INDEPENDENT_AMBULATORY_CARE_PROVIDER_SITE_OTHER): Payer: Medicare Other

## 2023-02-24 VITALS — Ht 61.0 in | Wt 122.0 lb

## 2023-02-24 DIAGNOSIS — Z0000 Encounter for general adult medical examination without abnormal findings: Secondary | ICD-10-CM

## 2023-02-24 NOTE — Patient Instructions (Signed)
Erica [name] , Thank you for taking time to come for your Medicare Wellness Visit. I appreciate your ongoing commitment to your health goals. Please review the following plan we discussed and let me know if I can assist you in the future.   These are the goals we discussed:  Goals      Have 3 meals a day     Continue to eat healthy and exercise as tolerated.         This is a list of the screening recommended for you and due dates:  Health Maintenance  Topic Date Due   Zoster (Shingles) Vaccine (1 of 2) Never done   Flu Shot  03/27/2023*   Medicare Annual Wellness Visit  02/24/2024   DTaP/Tdap/Td vaccine (3 - Td or Tdap) 08/22/2027   Pneumonia Vaccine  Completed   DEXA scan (bone density measurement)  Completed   COVID-19 Vaccine  Completed   HPV Vaccine  Aged Out   Colon Cancer Screening  Discontinued  *Topic was postponed. The date shown is not the original due date.    Advanced directives: Advance directive discussed with you today. I have provided a copy for you to complete at home and have notarized. Once this is complete please bring a copy in to our office so we can scan it into your chart.   Conditions/risks identified: Aim for 30 minutes of exercise or brisk walking, 6-8 glasses of water, and 5 servings of fruits and vegetables each day.   Next appointment: Follow up in one year for your annual wellness visit    Preventive Care 65 Years and Older, Female Preventive care refers to lifestyle choices and visits with your health care provider that can promote health and wellness. What does preventive care include? A yearly physical exam. This is also called an annual well check. Dental exams once or twice a year. Routine eye exams. Ask your health care provider how often you should have your eyes checked. Personal lifestyle choices, including: Daily care of your teeth and gums. Regular physical activity. Eating a healthy diet. Avoiding tobacco and drug use. Limiting  alcohol use. Practicing safe sex. Taking low-dose aspirin every day. Taking vitamin and mineral supplements as recommended by your health care provider. What happens during an annual well check? The services and screenings done by your health care provider during your annual well check will depend on your age, overall health, lifestyle risk factors, and family history of disease. Counseling  Your health care provider may ask you questions about your: Alcohol use. Tobacco use. Drug use. Emotional well-being. Home and relationship well-being. Sexual activity. Eating habits. History of falls. Memory and ability to understand (cognition). Work and work Statistician. Reproductive health. Screening  You may have the following tests or measurements: Height, weight, and BMI. Blood pressure. Lipid and cholesterol levels. These may be checked every 5 years, or more frequently if you are over 58 years old. Skin check. Lung cancer screening. You may have this screening every year starting at age 28 if you have a 30-pack-year history of smoking and currently smoke or have quit within the past 15 years. Fecal occult blood test (FOBT) of the stool. You may have this test every year starting at age 82. Flexible sigmoidoscopy or colonoscopy. You may have a sigmoidoscopy every 5 years or a colonoscopy every 10 years starting at age 66. Hepatitis C blood test. Hepatitis B blood test. Sexually transmitted disease (STD) testing. Diabetes screening. This is done by checking your  blood sugar (glucose) after you have not eaten for a while (fasting). You may have this done every 1-3 years. Bone density scan. This is done to screen for osteoporosis. You may have this done starting at age 80. Mammogram. This may be done every 1-2 years. Talk to your health care provider about how often you should have regular mammograms. Talk with your health care provider about your test results, treatment options, and if  necessary, the need for more tests. Vaccines  Your health care provider may recommend certain vaccines, such as: Influenza vaccine. This is recommended every year. Tetanus, diphtheria, and acellular pertussis (Tdap, Td) vaccine. You may need a Td booster every 10 years. Zoster vaccine. You may need this after age 60. Pneumococcal 13-valent conjugate (PCV13) vaccine. One dose is recommended after age 75. Pneumococcal polysaccharide (PPSV23) vaccine. One dose is recommended after age 70. Talk to your health care provider about which screenings and vaccines you need and how often you need them. This information is not intended to replace advice given to you by your health care provider. Make sure you discuss any questions you have with your health care provider. Document Released: 01/09/2016 Document Revised: 09/01/2016 Document Reviewed: 10/14/2015 Elsevier Interactive Patient Education  2017 [place] Prevention in the Home Falls can cause injuries. They can happen to people of all ages. There are many things you can do to make your home safe and to help prevent falls. What can I do on the outside of my home? Regularly fix the edges of walkways and driveways and fix any cracks. Remove anything that might make you trip as you walk through a door, such as a raised step or threshold. Trim any bushes or trees on the path to your home. Use bright outdoor lighting. Clear any walking paths of anything that might make someone trip, such as rocks or tools. Regularly check to see if handrails are loose or broken. Make sure that both sides of any steps have handrails. Any raised decks and porches should have guardrails on the edges. Have any leaves, snow, or ice cleared regularly. Use sand or salt on walking paths during winter. Clean up any spills in your garage right away. This includes oil or grease spills. What can I do in the bathroom? Use night lights. Install grab bars by the toilet  and in the tub and shower. Do not use towel bars as grab bars. Use non-skid mats or decals in the tub or shower. If you need to sit down in the shower, use a plastic, non-slip stool. Keep the floor dry. Clean up any water that spills on the floor as soon as it happens. Remove soap buildup in the tub or shower regularly. Attach bath mats securely with double-sided non-slip rug tape. Do not have throw rugs and other things on the floor that can make you trip. What can I do in the bedroom? Use night lights. Make sure that you have a light by your bed that is easy to reach. Do not use any sheets or blankets that are too big for your bed. They should not hang down onto the floor. Have a firm chair that has side arms. You can use this for support while you get dressed. Do not have throw rugs and other things on the floor that can make you trip. What can I do in the kitchen? Clean up any spills right away. Avoid walking on wet floors. Keep items that you use a lot in  easy-to-reach places. If you need to reach something above you, use a strong step stool that has a grab bar. Keep electrical cords out of the way. Do not use floor polish or wax that makes floors slippery. If you must use wax, use non-skid floor wax. Do not have throw rugs and other things on the floor that can make you trip. What can I do with my stairs? Do not leave any items on the stairs. Make sure that there are handrails on both sides of the stairs and use them. Fix handrails that are broken or loose. Make sure that handrails are as long as the stairways. Check any carpeting to make sure that it is firmly attached to the stairs. Fix any carpet that is loose or worn. Avoid having throw rugs at the top or bottom of the stairs. If you do have throw rugs, attach them to the floor with carpet tape. Make sure that you have a light switch at the top of the stairs and the bottom of the stairs. If you do not have them, ask someone to add  them for you. What else can I do to help prevent falls? Wear shoes that: Do not have high heels. Have rubber bottoms. Are comfortable and fit you well. Are closed at the toe. Do not wear sandals. If you use a stepladder: Make sure that it is fully opened. Do not climb a closed stepladder. Make sure that both sides of the stepladder are locked into place. Ask someone to hold it for you, if possible. Clearly mark and make sure that you can see: Any grab bars or handrails. First and last steps. Where the edge of each step is. Use tools that help you move around (mobility aids) if they are needed. These include: Canes. Walkers. Scooters. Crutches. Turn on the lights when you go into a dark area. Replace any light bulbs as soon as they burn out. Set up your furniture so you have a clear path. Avoid moving your furniture around. If any of your floors are uneven, fix them. If there are any pets around you, be aware of where they are. Review your medicines with your doctor. Some medicines can make you feel dizzy. This can increase your chance of falling. Ask your doctor what other things that you can do to help prevent falls. This information is not intended to replace advice given to you by your health care provider. Make sure you discuss any questions you have with your health care provider. Document Released: 10/09/2009 Document Revised: 05/20/2016 Document Reviewed: 01/17/2015 Elsevier Interactive Patient Education  2017 [employer].

## 2023-02-24 NOTE — Progress Notes (Signed)
Subjective:   Erica [name] is a 89 y.o. female who presents for Medicare Annual (Subsequent) preventive examination. I connected with  Erica [name] on 02/24/23 by a audio enabled telemedicine application and verified that I am speaking with the correct person using two identifiers.  Patient Location: Home  Provider Location: Home Office  I discussed the limitations of evaluation and management by telemedicine. The patient expressed understanding and agreed to proceed.  Review of Systems     Cardiac Risk Factors include: advanced age (>62mn, >>13women);hypertension;dyslipidemia     Objective:    Today's Vitals   02/24/23 1318  Weight: 122 lb (55.3 kg)  Height: '5\' 1"'$  (1.549 m)   Body mass index is 23.05 kg/m.     02/24/2023    1:24 PM 09/23/2022    2:52 PM 11/23/2021    2:04 PM 05/17/2020    4:09 PM 04/23/2019   12:59 PM 04/03/2018    2:19 PM 08/21/2017    3:13 PM  Advanced Directives  Does Patient Have a Medical Advance Directive? No Yes Yes Yes Yes Yes No  Type of ACorporate treasurerof [place] will [place] will [place] Lake[place] will Living will    Does patient want to make changes to medical advance directive?  No - Patient declined       Copy of HMurrayin Chart?  No - copy requested No - copy requested      Would patient like information on creating a medical advance directive? No - Patient declined  No - Patient declined  No - Patient declined  No - Patient declined    Current Medications (verified) Outpatient Encounter Medications as of 02/24/2023  Medication Sig   alendronate (FOSAMAX) 70 MG tablet TAKE ONE TABLET ONCE WEEKLY ON AN EMPTY STOMACH WITH A FULL GLASS OF WATER.   amiodarone (PACERONE) 200 MG tablet Take 1 tablet (200 mg total) by mouth daily.   amLODipine (NORVASC) 5 MG tablet Take 1 tablet (5 mg total) by mouth 2 (two) times daily.   apixaban (ELIQUIS) 2.5  MG TABS tablet TAKE (1) TABLET TWICE A DAY.   atorvastatin (LIPITOR) 10 MG tablet TAKE ONE TABLET DAILY AT 6PM   Cholecalciferol (VITAMIN D3) 125 MCG (5000 UT) CAPS Take 1 capsule by mouth every other day.   diclofenac Sodium (VOLTAREN) 1 % GEL Apply 4 g topically 4 (four) times daily.   escitalopram (LEXAPRO) 20 MG tablet Take 2 tablets (40 mg total) by mouth daily.   esomeprazole (NEXIUM) 40 MG capsule TAKE ONE CAPSULE TWICE DAILY BEFORE MEALS   fluticasone (FLONASE) 50 MCG/ACT nasal spray Place 2 sprays into both nostrils daily.   furosemide (LASIX) 20 MG tablet Take 1 tablet (20 mg total) by mouth daily.   hydrocortisone (ANUSOL-HC) 2.5 % rectal cream Place 1 application rectally 2 (two) times daily. (Patient taking differently: Place 1 application  rectally 2 (two) times daily as needed for hemorrhoids or anal itching.)   levothyroxine (SYNTHROID) 88 MCG tablet TAKE (1) TABLET DAILY BE- FORE BREAKFAST.   Melatonin 5 MG CAPS Take 5 mg by mouth at bedtime as needed (sleep).   Multiple Vitamin (MULTIVITAMIN WITH MINERALS) TABS tablet Take 1 tablet by mouth daily.   olmesartan (BENICAR) 40 MG tablet Take 1 tablet (40 mg total) by mouth daily.   ondansetron (ZOFRAN) 4 MG tablet Take 1 tablet (4 mg total) by mouth every 8 (eight) hours as needed  for nausea or vomiting.   Pediatric Multiple Vitamins (FLINTSTONES PLUS EXTRA C) CHEW Chew by mouth.   Wheat Dextrin (BENEFIBER) POWD Take 1 Dose by mouth daily. Take daily   No facility-administered encounter medications on file as of 02/24/2023.    Allergies (verified) Contrast media [iodinated contrast media], Iodine, Iohexol, Ioxaglate, Bactrim [sulfamethoxazole-trimethoprim], and Metoclopramide   History: Past Medical History:  Diagnosis Date   Abnormal transaminases 05/09/2019   Abnormality of gait 08/25/2016   Adenomatous rectal polyp    Anxiety about health 05/09/2019   Arthritis 04/03/2018   L hip   Atrial fibrillation (HCC)    Bleeding  internal hemorrhoids    Cataract    Chronic back pain    Depression    Diverticulosis of colon (without mention of hemorrhage)    Gastroparesis    GERD (gastroesophageal reflux disease)    Hiatal hernia 09/1999   3cm   Hyperlipidemia    Hypertension    Hypothyroid    Ischemic colitis ([place])    Left foot drop [id number]   Lichen sclerosus et atrophicus of the vulva    MVP (mitral valve prolapse)    Osteoporosis    Sleep apnea    TIA (transient ischemic attack)    Weakness    Past Surgical History:  Procedure Laterality Date   CARDIAC CATHETERIZATION N/A 02/03/2016   Procedure: Coronary/Graft Angiography;  Surgeon: Charolette Forward, MD;  Location: [facility];  Service: Cardiovascular;  Laterality: N/A;   CATARACT EXTRACTION     CHOLECYSTECTOMY     COLONOSCOPY     Multiple, polyps   ESOPHAGEAL MANOMETRY N/A 06/14/2016   Procedure: ESOPHAGEAL MANOMETRY (EM);  Surgeon: Gatha Mayer, MD;  Location: [facility];  Service: Endoscopy;  Laterality: N/A;   LEFT HEART CATHETERIZATION WITH CORONARY ANGIOGRAM N/A 07/17/2013   Procedure: LEFT HEART CATHETERIZATION WITH CORONARY ANGIOGRAM;  Surgeon: Clent Demark, MD;  Location: [facility];  Service: Cardiovascular;  Laterality: N/A;   SIGMOIDOSCOPY  08/23/2011   TUBAL LIGATION     UPPER GASTROINTESTINAL ENDOSCOPY  08/23/2011   Normal   Family History  Problem Relation Age of Onset   Stomach cancer Brother    Colon cancer Paternal Aunt    Lung cancer Brother        smoker   Esophageal cancer Father    Stroke Mother    Social History   Socioeconomic History   Marital status: Widowed    Spouse name: Not on file   Number of children: 9   Years of education: 12   Highest education level: 12th grade  Occupational History   Occupation: retired    Fish farm manager: RETIRED  Tobacco Use   Smoking status: Never    Passive exposure: Never   Smokeless tobacco: Never  Vaping Use   Vaping Use: Never used  Substance and Sexual Activity    Alcohol use: No   Drug use: No   Sexual activity: Not Currently    Partners: Male  Other Topics Concern   Not on file  Social History Narrative   Lives at home alone.   Right-handed.   2 cups caffeine most days.   8 grandkids   21 great grand kids   Social Determinants of Health   Financial Resource Strain: Low Risk  (02/24/2023)   Overall Financial Resource Strain (CARDIA)    Difficulty of Paying Living Expenses: Not hard at all  Food Insecurity: No Food Insecurity (02/24/2023)   Hunger Vital Sign  Worried About Charity fundraiser in the Last Year: Never true    [place] in the Last Year: Never true  Transportation Needs: No Transportation Needs (02/24/2023)   PRAPARE - Hydrologist (Medical): No    Lack of Transportation (Non-Medical): No  Physical Activity: Insufficiently Active (02/24/2023)   Exercise Vital Sign    Days of Exercise per Week: 3 days    Minutes of Exercise per Session: 30 min  Stress: No Stress Concern Present (02/24/2023)   [place]    Feeling of Stress : Not at all  Social Connections: Moderately Isolated (02/24/2023)   Social Connection and Isolation Panel [NHANES]    Frequency of Communication with Friends and Family: More than three times a week    Frequency of Social Gatherings with Friends and Family: More than three times a week    Attends Religious Services: 1 to 4 times per year    Active Member of [employer] or Organizations: No    Attends Archivist Meetings: Never    Marital Status: Widowed    Tobacco Counseling Counseling given: Not Answered   Clinical Intake:  Pre-visit preparation completed: Yes  Pain : No/denies pain     Nutritional Risks: None Diabetes: No  How often do you need to have someone help you when you read instructions, pamphlets, or other written materials from your doctor or pharmacy?: 1 -  Never  Diabetic?no   Interpreter Needed?: No  Information entered by :: Jadene Pierini, LPN   Activities of Daily Living    02/24/2023    1:24 PM  In your present state of health, do you have any difficulty performing the following activities:  Hearing? 0  Vision? 0  Difficulty concentrating or making decisions? 0  Walking or climbing stairs? 0  Dressing or bathing? 0  Doing errands, shopping? 0  Preparing Food and eating ? N  Using the Toilet? N  In the past six months, have you accidently leaked urine? N  Do you have problems with loss of bowel control? N  Managing your Medications? N  Managing your Finances? N  Housekeeping or managing your Housekeeping? N    Patient Care Team: [name], [name] as PCP - General (Family Medicine) Jovita Gamma, MD as Consulting Physician (Neurosurgery)  Indicate any recent Medical Services you may have received from other than [facility] providers in the past year (date may be approximate).     Assessment:   This is a routine wellness examination for Ursa.  Hearing/Vision screen Vision Screening - Comments:: Wears rx glasses - up to date with routine eye exams with  Dr.Johnson   Dietary issues and exercise activities discussed: Current Exercise Habits: Home exercise routine, Type of exercise: walking, Time (Minutes): 30, Frequency (Times/Week): 3, Weekly Exercise (Minutes/Week): 90, Intensity: Mild, Exercise limited by: None identified   Goals Addressed             This Visit's Progress    Have 3 meals a day   On track    Continue to eat healthy and exercise as tolerated.        Depression Screen    01/24/2023    4:19 PM 11/01/2022    3:24 PM 09/23/2022    2:49 PM 05/31/2022    2:52 PM 04/09/2022   10:58 AM 11/30/2021    3:08 PM 11/23/2021    2:00 PM  PHQ 2/9 Scores  PHQ - 2 Score 0 0 0 0 0 2 0  PHQ- 9 Score 2 0  0 0 4     Fall Risk    02/24/2023    1:20 PM 01/24/2023    4:19 PM 11/01/2022    3:24 PM  09/23/2022    2:51 PM 08/03/2022    3:30 PM  [place] in the past year? 0 0 0 0 0  Number falls in past yr: 0 0  0   Injury with Fall? 0 0  0   Risk for fall due to : No Fall Risks No Fall Risks  No Fall Risks   Follow up Falls prevention discussed Falls evaluation completed  Falls evaluation completed;Education provided;Falls prevention discussed     FALL RISK PREVENTION PERTAINING TO THE HOME:  Any stairs in or around the home? Yes  If so, are there any without handrails? No  Home free of loose throw rugs in walkways, pet beds, electrical cords, etc? Yes  Adequate lighting in your home to reduce risk of falls? Yes   ASSISTIVE DEVICES UTILIZED TO PREVENT FALLS:  Life alert? No  Use of a cane, walker or w/c? No  Grab bars in the bathroom? Yes  Shower chair or bench in shower? Yes  Elevated toilet seat or a handicapped toilet? No          02/24/2023    1:24 PM 11/23/2021    2:09 PM  6CIT Screen  What Year? 0 points 0 points  What month? 0 points 0 points  What time? 0 points 0 points  Count back from 20 0 points 0 points  Months in reverse 2 points 0 points  Repeat phrase 2 points 0 points  Total Score 4 points 0 points    Immunizations Immunization History  Administered Date(s) Administered   Covid-19, Mrna,Vaccine(Spikevax)53yr and older 12/29/2022   Fluad Quad(high Dose 65+) 10/04/2019, 10/22/2020, 11/30/2021   Influenza, High Dose Seasonal PF 10/03/2013, 09/20/2014, 09/27/2016, 10/12/2017, 10/11/2018   Influenza,inj,Quad PF,6+ Mos 10/08/2015   Influenza-Unspecified 10/17/1997, 10/11/2001, 10/08/2015   Moderna Sars-Covid-2 Vaccination 03/10/2020, 11/18/2020   Pneumococcal Conjugate-13 09/27/2016   Pneumococcal Polysaccharide-23 10/17/1997, 11/16/2013   Tdap 10/05/2011, 08/21/2017   Zoster, Live 10/03/2013    TDAP status: Up to date  Flu Vaccine status: Up to date  Pneumococcal vaccine status: Up to date  Covid-19 vaccine status: Completed  vaccines  Qualifies for Shingles Vaccine? Yes   Zostavax completed No   Shingrix Completed?: No.    Education has been provided regarding the importance of this vaccine. Patient has been advised to call insurance company to determine out of pocket expense if they have not yet received this vaccine. Advised may also receive vaccine at local pharmacy or Health Dept. Verbalized acceptance and understanding.  Screening Tests Health Maintenance  Topic Date Due   Zoster Vaccines- Shingrix (1 of 2) Never done   INFLUENZA VACCINE  03/27/2023 (Originally 07/27/2022)   Medicare Annual Wellness (AWV)  02/24/2024   DTaP/Tdap/Td (3 - Td or Tdap) 08/22/2027   Pneumonia Vaccine 89 Years old  Completed   DEXA SCAN  Completed   COVID-19 Vaccine  Completed   HPV VACCINES  Aged Out   COLONOSCOPY (Pts [phone] coverage will need to be confirmed)  Discontinued    Health Maintenance  Health Maintenance Due  Topic Date Due   Zoster Vaccines- Shingrix (1 of 2) Never done    Colorectal cancer screening: No longer required.  Mammogram status: No longer required due to age.  Bone Density status: Completed 04/02/2021. Results reflect: Bone density results: OSTEOPOROSIS. Repeat every 2 years.  Lung Cancer Screening: (Low Dose CT Chest recommended if Age 68-80 years, 30 pack-year currently smoking OR have quit w/in 15years.) does not qualify.   Lung Cancer Screening Referral: n/a  Additional Screening:  Hepatitis C Screening: does not qualify;   Vision Screening: Recommended annual ophthalmology exams for early detection of glaucoma and other disorders of the eye. Is the patient up to date with their annual eye exam?  Yes  Who is the provider or what is the name of the office in which the patient attends annual eye exams? Dr.Groat  If pt is not established with a provider, would they like to be referred to a provider to establish care? No .   Dental Screening: Recommended annual dental  exams for proper oral hygiene  Community Resource Referral / Chronic Care Management: CRR required this visit?  No   CCM required this visit?  No      Plan:     I have personally reviewed and noted the following in the patient's chart:   Medical and social history Use of alcohol, tobacco or illicit drugs  Current medications and supplements including opioid prescriptions. Patient is not currently taking opioid prescriptions. Functional ability and status Nutritional status Physical activity Advanced directives List of other physicians Hospitalizations, surgeries, and ER visits in previous 12 months Vitals Screenings to include cognitive, depression, and falls Referrals and appointments  In addition, I have reviewed and discussed with patient certain preventive protocols, quality metrics, and best practice recommendations. A written personalized care plan for preventive services as well as general preventive health recommendations were provided to patient.     Daphane Shepherd, LPN   [id number]   Nurse Notes: Will Get Dexa at GYN per Patient

## 2023-03-02 ENCOUNTER — Encounter: Payer: Self-pay | Admitting: *Deleted

## 2023-03-08 ENCOUNTER — Ambulatory Visit: Payer: Medicare Other | Admitting: Urology

## 2023-03-28 DIAGNOSIS — R3121 Asymptomatic microscopic hematuria: Secondary | ICD-10-CM | POA: Diagnosis not present

## 2023-03-28 DIAGNOSIS — N3941 Urge incontinence: Secondary | ICD-10-CM | POA: Diagnosis not present

## 2023-04-14 DIAGNOSIS — I2510 Atherosclerotic heart disease of native coronary artery without angina pectoris: Secondary | ICD-10-CM | POA: Diagnosis not present

## 2023-04-14 DIAGNOSIS — Z8679 Personal history of other diseases of the circulatory system: Secondary | ICD-10-CM | POA: Diagnosis not present

## 2023-04-14 DIAGNOSIS — I10 Essential (primary) hypertension: Secondary | ICD-10-CM | POA: Diagnosis not present

## 2023-04-14 DIAGNOSIS — E782 Mixed hyperlipidemia: Secondary | ICD-10-CM | POA: Diagnosis not present

## 2023-05-29 DIAGNOSIS — M7918 Myalgia, other site: Secondary | ICD-10-CM | POA: Diagnosis not present

## 2023-05-29 DIAGNOSIS — I10 Essential (primary) hypertension: Secondary | ICD-10-CM | POA: Diagnosis not present

## 2023-05-30 DIAGNOSIS — N3020 Other chronic cystitis without hematuria: Secondary | ICD-10-CM | POA: Diagnosis not present

## 2023-05-30 DIAGNOSIS — R3121 Asymptomatic microscopic hematuria: Secondary | ICD-10-CM | POA: Diagnosis not present

## 2023-05-30 DIAGNOSIS — N3941 Urge incontinence: Secondary | ICD-10-CM | POA: Diagnosis not present

## 2023-05-30 DIAGNOSIS — N952 Postmenopausal atrophic vaginitis: Secondary | ICD-10-CM | POA: Diagnosis not present

## 2023-06-13 DIAGNOSIS — R42 Dizziness and giddiness: Secondary | ICD-10-CM | POA: Diagnosis not present

## 2023-06-13 DIAGNOSIS — H6502 Acute serous otitis media, left ear: Secondary | ICD-10-CM | POA: Diagnosis not present

## 2023-06-17 ENCOUNTER — Other Ambulatory Visit: Payer: Self-pay | Admitting: Nurse Practitioner

## 2023-06-17 DIAGNOSIS — E782 Mixed hyperlipidemia: Secondary | ICD-10-CM

## 2023-06-17 DIAGNOSIS — I10 Essential (primary) hypertension: Secondary | ICD-10-CM

## 2023-06-17 DIAGNOSIS — M810 Age-related osteoporosis without current pathological fracture: Secondary | ICD-10-CM

## 2023-06-17 DIAGNOSIS — K219 Gastro-esophageal reflux disease without esophagitis: Secondary | ICD-10-CM

## 2023-06-17 DIAGNOSIS — I2510 Atherosclerotic heart disease of native coronary artery without angina pectoris: Secondary | ICD-10-CM

## 2023-06-22 ENCOUNTER — Ambulatory Visit (HOSPITAL_COMMUNITY)
Admission: RE | Admit: 2023-06-22 | Discharge: 2023-06-22 | Disposition: A | Discharge status: Home / Self Care | Payer: Medicare Other | Source: Ambulatory Visit | Attending: Family Medicine | Admitting: Family Medicine

## 2023-06-22 ENCOUNTER — Ambulatory Visit (INDEPENDENT_AMBULATORY_CARE_PROVIDER_SITE_OTHER): Payer: Medicare Other | Admitting: Family Medicine

## 2023-06-22 ENCOUNTER — Encounter: Payer: Self-pay | Admitting: Family Medicine

## 2023-06-22 VITALS — BP 152/64 | HR 61 | Temp 96.9°F | Resp 20 | Ht 61.0 in | Wt 122.0 lb

## 2023-06-22 DIAGNOSIS — K449 Diaphragmatic hernia without obstruction or gangrene: Secondary | ICD-10-CM | POA: Diagnosis not present

## 2023-06-22 DIAGNOSIS — R1084 Generalized abdominal pain: Secondary | ICD-10-CM

## 2023-06-22 DIAGNOSIS — K921 Melena: Secondary | ICD-10-CM | POA: Insufficient documentation

## 2023-06-22 DIAGNOSIS — K5730 Diverticulosis of large intestine without perforation or abscess without bleeding: Secondary | ICD-10-CM | POA: Diagnosis not present

## 2023-06-22 DIAGNOSIS — N390 Urinary tract infection, site not specified: Secondary | ICD-10-CM | POA: Diagnosis not present

## 2023-06-22 DIAGNOSIS — R197 Diarrhea, unspecified: Secondary | ICD-10-CM | POA: Diagnosis present

## 2023-06-22 DIAGNOSIS — R1030 Lower abdominal pain, unspecified: Secondary | ICD-10-CM | POA: Diagnosis not present

## 2023-06-22 LAB — URINALYSIS, ROUTINE W REFLEX MICROSCOPIC
Bilirubin, UA: NEGATIVE
Glucose, UA: NEGATIVE
Nitrite, UA: NEGATIVE
Protein,UA: NEGATIVE
Specific Gravity, UA: 1.015 (ref 1.005–1.030)
Urobilinogen, Ur: 1 mg/dL (ref 0.2–1.0)
pH, UA: 6 (ref 5.0–7.5)

## 2023-06-22 LAB — MICROSCOPIC EXAMINATION: Renal Epithel, UA: NONE SEEN /hpf

## 2023-06-22 NOTE — Progress Notes (Signed)
Acute Office Visit  Subjective:     Patient ID: Erica [name], female    DOB: 06/18/1930, 89 y.o.   MRN: [id number]  Chief Complaint  Patient presents with   Diarrhea    For 2 weeks. Terrible smell    Diarrhea  This is a new problem. Episode onset: 2 weeks. The problem occurs 2 to 4 times per day. The problem has been gradually worsening. The stool consistency is described as Mucous (dark stool, blood when wiping. Loose stools, not watery). The patient states that diarrhea does not awaken her from sleep. Associated symptoms include abdominal pain (intermittent), bloating and myalgias. Pertinent negatives include no arthralgias, chills, coughing, fever, headaches, increased  flatus, sweats, URI, vomiting or weight loss. Associated symptoms comments: Decreased appetitie. Risk factors include recent antibiotic use.   Hx of hemorrhoids and hx of polyps. Hx of recurrent UTIs. She has been on keflex 250 mg daily for prohphalxis daily. She started this 2 weeks or so ago. Also has hx of diverticulosis. Reports bad smell to stools. Pain in bilateral flank areas for "awhile". Hx of arthritis in back.   Review of Systems  Constitutional:  Negative for chills, fever and weight loss.  Respiratory:  Negative for cough.   Gastrointestinal:  Positive for abdominal pain (intermittent), bloating and diarrhea. Negative for flatus and vomiting.  Musculoskeletal:  Positive for myalgias. Negative for arthralgias.  Neurological:  Negative for headaches.        Objective:    BP (!) 152/64   Pulse 61   Temp (!) 96.9 F (36.1 C) (Temporal)   Resp 20   Ht 5\' 1"  (1.549 m)   Wt 122 lb (55.3 kg)   SpO2 97%   BMI 23.05 kg/m  Wt Readings from Last 3 Encounters:  06/22/23 122 lb (55.3 kg)  02/24/23 122 lb (55.3 kg)  01/24/23 122 lb 3.2 oz (55.4 kg)      Physical Exam Vitals and nursing note reviewed. Exam conducted with a chaperone present.  Constitutional:      General: She is not in acute  distress.    Appearance: She is not ill-appearing, toxic-appearing or diaphoretic.  Eyes:     General: No scleral icterus. Cardiovascular:     Rate and Rhythm: Normal rate and regular rhythm.     Heart sounds: Normal heart sounds. No murmur heard. Pulmonary:     Effort: Pulmonary effort is normal. No respiratory distress.  Abdominal:     General: Bowel sounds are normal. There is no distension.     Palpations: Abdomen is soft.     Tenderness: There is generalized abdominal tenderness. There is no right CVA tenderness, left CVA tenderness, guarding or rebound. Negative signs include Murphy's sign and McBurney's sign.     Hernia: No hernia is present.  Genitourinary:    Rectum: External hemorrhoid present. No mass, tenderness, anal fissure or internal hemorrhoid.  Musculoskeletal:     Cervical back: Neck supple. No rigidity.     Right lower leg: No edema.     Left lower leg: No edema.  Skin:    General: Skin is warm and dry.     Coloration: Skin is not jaundiced.  Neurological:     Mental Status: She is alert and oriented to person, place, and time. Mental status is at baseline.  Psychiatric:        Mood and Affect: Mood normal.        Behavior: Behavior normal.     Urine  dipstick shows positive for RBC's, positive for leukocytes, and positive for urobilinogen.  Micro exam: 0-5 WBC's per HPF, 0-2 RBC's per HPF, moderate + bacteria, and hyaline casts seen.       Assessment & Plan:   Brandilee was seen today for diarrhea.  Diagnoses and all orders for this visit:  Generalized abdominal pain -     Urinalysis, Routine w reflex microscopic -     Urine Culture -     CT Abdomen Pelvis Wo Contrast; Future  Acute diarrhea -     CT Abdomen Pelvis Wo Contrast; Future  Blood in stool -     CT Abdomen Pelvis Wo Contrast; Future -     Fecal occult blood, imunochemical([facility])  Recurrent UTI -     CT Abdomen Pelvis Wo Contrast; Future   Generalized abdominal pain with  generalized tenderness on exam. Diarrhea x 2 weeks, with intermittently bloody stools. Also reporting flank pain, but no CVA tenderness. Currently on keflex daily for recurrent UTIs. UA micro with moderate dip and hyaline cast. No nitrates. Hx of diverticulosis noted on previous CT. Reports hx of colonic polyps. CT ordered to assess for diverticulitis as she would be a high risk patient given her age as well as for complications of recurrent UTI or possible mass given hx of polyps. If CT negative, discussed stool cultures. Urine culture and FOBT pending. Will notify patient of CT results when available and plan of care pending CT results.   Return to office for new or worsening symptoms, or if symptoms persist.   The patient indicates understanding of these issues and agrees with the plan.  Gabriel Earing, [name]

## 2023-06-24 ENCOUNTER — Other Ambulatory Visit: Payer: Self-pay | Admitting: Family Medicine

## 2023-06-24 ENCOUNTER — Other Ambulatory Visit: Payer: Medicare Other

## 2023-06-24 DIAGNOSIS — R197 Diarrhea, unspecified: Secondary | ICD-10-CM | POA: Diagnosis not present

## 2023-06-24 DIAGNOSIS — K921 Melena: Secondary | ICD-10-CM | POA: Diagnosis not present

## 2023-06-24 LAB — URINE CULTURE

## 2023-06-25 LAB — FECAL OCCULT BLOOD, IMMUNOCHEMICAL: Fecal Occult Bld: POSITIVE — AB

## 2023-06-26 LAB — CDIFF NAA+O+P+STOOL CULTURE: E coli, Shiga toxin Assay: NEGATIVE

## 2023-06-27 LAB — CDIFF NAA+O+P+STOOL CULTURE: Toxigenic C. Difficile by PCR: NEGATIVE

## 2023-06-28 LAB — CDIFF NAA+O+P+STOOL CULTURE

## 2023-06-29 LAB — CDIFF NAA+O+P+STOOL CULTURE: Toxigenic C. Difficile by PCR: NEGATIVE

## 2023-07-01 ENCOUNTER — Other Ambulatory Visit: Payer: Self-pay | Admitting: Family Medicine

## 2023-07-01 ENCOUNTER — Telehealth: Payer: Self-pay | Admitting: Nurse Practitioner

## 2023-07-01 DIAGNOSIS — R1084 Generalized abdominal pain: Secondary | ICD-10-CM

## 2023-07-01 DIAGNOSIS — R195 Other fecal abnormalities: Secondary | ICD-10-CM

## 2023-07-01 DIAGNOSIS — R197 Diarrhea, unspecified: Secondary | ICD-10-CM

## 2023-07-01 NOTE — Telephone Encounter (Signed)
Pt called back stating that she told the nurse on the phone that she did not want to be referred to see a GI specialist but has now changed her mind and does want to. Would like referral to be sent to [facility] location if possible.

## 2023-07-04 NOTE — Telephone Encounter (Signed)
Pt is checking on her gastroenterologist referral

## 2023-07-13 ENCOUNTER — Encounter: Payer: Self-pay | Admitting: Gastroenterology

## 2023-07-13 ENCOUNTER — Ambulatory Visit: Payer: Medicare Other | Admitting: Gastroenterology

## 2023-07-13 VITALS — BP 128/62 | HR 54 | Temp 98.6°F | Ht 60.0 in | Wt 125.4 lb

## 2023-07-13 DIAGNOSIS — K625 Hemorrhage of anus and rectum: Secondary | ICD-10-CM

## 2023-07-13 DIAGNOSIS — R195 Other fecal abnormalities: Secondary | ICD-10-CM | POA: Diagnosis not present

## 2023-07-13 NOTE — Patient Instructions (Signed)
Please complete labs to check for anemia. We will be in touch with results. Please monitor for further blood in the stool or black stools. At this time, unless your labs show anemia or you see more blood in the stool, I would not advise further testing. Please call with any changes or new symptoms.

## 2023-07-13 NOTE — Progress Notes (Signed)
GI Office Note    Referring Provider: Gabriel Earing, [name] Primary Care Physician:  Bennie Pierini, [name]  Primary Gastroenterologist: Roetta Sessions, MD   Chief Complaint   Chief Complaint  Patient presents with   New Patient (Initial Visit)    Referred for pos stool test     History of Present Illness   Erica [name] is a 89 y.o. female presenting today at the request of Harlow Mares, [name] for positive FOBT, abdominal pain, and diarrhea.  Formerly seen by Dr. Leone Payor for years, last seen in 2021. She has history of esophageal dysmotility, gastroparesis, internal hemrhoids, anorexia, weight loss felt to be due to depressed mood.   Recently seen by PCP for diarrhea back in June.  Cdiff NAA and O+P negative.  Stool was Hemoccult positive.  Patient states she had some dried blood noted at times with wiping.  Hemorrhoids flare at times.  States she had a little bit of diarrhea with mucus but this is resolved.  Bowel movements now good, occur daily.  Denies any abdominal pain.  No heartburn or dysphagia.  At times she has some regurgitation which she blames on her gastroparesis.  No vomiting.  Denies any unintentional weight loss.  Has had issues with recurrent UTI, currently staying on cephalexin at bedtime.  Chronically on Eliquis.   EGD 2019: -tortuous esophagus -mild schatzki ring s/p dilation -single gastric polyp, hyperplastic and no h.pylori -small hh  Colonoscopy 06/2014: -three sessile polyps measuring 5-77mm in ascending colon, tubular adenoma -severe diverticulosis in sigmoid colon   CT A/P without contrast 05/2023 IMPRESSION: 1. Sigmoid diverticulosis without diverticulitis. 2. Small hiatal hernia. 3. Increased attenuation of the liver parenchyma consistent with history of amiodarone therapy.  Medications   Current Outpatient Medications  Medication Sig Dispense Refill   alendronate (FOSAMAX) 70 MG tablet TAKE 1 TABLET ONCE A WEEK ON AN EMPTY STOMACH  WITH A FULL GLASS OF WATER 12 tablet 0   amiodarone (PACERONE) 200 MG tablet TAKE ONE TABLET EVERY DAY 90 tablet 0   amLODipine (NORVASC) 5 MG tablet Take 1 tablet (5 mg total) by mouth 2 (two) times daily. 180 tablet 1   apixaban (ELIQUIS) 2.5 MG TABS tablet TAKE (1) TABLET TWICE A DAY. 60 tablet 5   atorvastatin (LIPITOR) 10 MG tablet TAKE ONE TABLET ONCE DAILY AT 6PM 90 tablet 0   cephALEXin (KEFLEX) 250 MG capsule Take 250 mg by mouth at bedtime.     Cholecalciferol (VITAMIN D3) 125 MCG (5000 UT) CAPS Take 1 capsule by mouth every other day.     diclofenac Sodium (VOLTAREN) 1 % GEL Apply 4 g topically 4 (four) times daily. 350 g 0   escitalopram (LEXAPRO) 20 MG tablet Take 2 tablets (40 mg total) by mouth daily. 180 tablet 1   esomeprazole (NEXIUM) 40 MG capsule TAKE ONE CAPSULE TWICE DAILY BEFORE MEALS 180 capsule 0   fluticasone (FLONASE) 50 MCG/ACT nasal spray Place 2 sprays into both nostrils daily. 16 g 6   furosemide (LASIX) 20 MG tablet TAKE ONE TABLET ONCE DAILY 90 tablet 0   hydrocortisone (ANUSOL-HC) 2.5 % rectal cream Place 1 application rectally 2 (two) times daily. (Patient taking differently: Place 1 application  rectally 2 (two) times daily as needed for hemorrhoids or anal itching.) 30 g 1   levothyroxine (SYNTHROID) 88 MCG tablet TAKE (1) TABLET DAILY BE- FORE BREAKFAST. 90 tablet 1   Multiple Vitamin (MULTIVITAMIN WITH MINERALS) TABS tablet Take 1 tablet by  mouth daily.     olmesartan (BENICAR) 40 MG tablet TAKE ONE TABLET ONCE DAILY 90 tablet 0   Pediatric Multiple Vitamins (FLINTSTONES PLUS EXTRA C) CHEW Chew by mouth.     polyethylene glycol (MIRALAX / GLYCOLAX) 17 g packet Take 17 g by mouth daily.     Wheat Dextrin (BENEFIBER) POWD Take 1 Dose by mouth daily. Take daily     No current facility-administered medications for this visit.    Allergies   Allergies as of 07/13/2023 - Review Complete 07/13/2023  Allergen Reaction Noted   Contrast media [iodinated  contrast media] Shortness Of Breath, Nausea Only, and Swelling 07/08/2011   Iodine Shortness Of Breath, Nausea Only, and Swelling 02/17/2011   Iohexol Hives and Swelling 05/05/2004   Ioxaglate Nausea Only, Shortness Of Breath, and Swelling 05/26/2016   Bactrim [sulfamethoxazole-trimethoprim] Nausea And Vomiting 10/26/2021   Metoclopramide Other (See Comments) 03/30/2012    Past Medical History   Past Medical History:  Diagnosis Date   Abnormal transaminases 05/09/2019   Abnormality of gait 08/25/2016   Adenomatous rectal polyp    Anxiety about health 05/09/2019   Arthritis 04/03/2018   L hip   Atrial fibrillation (HCC)    Bleeding internal hemorrhoids    Cataract    Chronic back pain    Depression    Diverticulosis of colon (without mention of hemorrhage)    Gastroparesis    GERD (gastroesophageal reflux disease)    Hiatal hernia 09/1999   3cm   Hyperlipidemia    Hypertension    Hypothyroid    Ischemic colitis (HCC)    Left foot drop 08/25/2016   Lichen sclerosus et atrophicus of the vulva    MVP (mitral valve prolapse)    Osteoporosis    Sleep apnea    TIA (transient ischemic attack)    Weakness     Past Surgical History   Past Surgical History:  Procedure Laterality Date   CARDIAC CATHETERIZATION N/A 02/03/2016   Procedure: Coronary/Graft Angiography;  Surgeon: Rinaldo Cloud, MD;  Location: [facility] INVASIVE CV LAB;  Service: Cardiovascular;  Laterality: N/A;   CATARACT EXTRACTION     CHOLECYSTECTOMY     COLONOSCOPY     Multiple, polyps   ESOPHAGEAL MANOMETRY N/A 06/14/2016   Procedure: ESOPHAGEAL MANOMETRY (EM);  Surgeon: Iva Boop, MD;  Location: [facility] ENDOSCOPY;  Service: Endoscopy;  Laterality: N/A;   LEFT HEART CATHETERIZATION WITH CORONARY ANGIOGRAM N/A 07/17/2013   Procedure: LEFT HEART CATHETERIZATION WITH CORONARY ANGIOGRAM;  Surgeon: Robynn Pane, MD;  Location: [facility] CATH LAB;  Service: Cardiovascular;  Laterality: N/A;   SIGMOIDOSCOPY  08/23/2011   TUBAL  LIGATION     UPPER GASTROINTESTINAL ENDOSCOPY  08/23/2011   Normal    Past Family History   Family History  Problem Relation Age of Onset   Stomach cancer Brother    Colon cancer Paternal Aunt    Lung cancer Brother        smoker   Esophageal cancer Father    Stroke Mother     Past Social History   Social History   Socioeconomic History   Marital status: Widowed    Spouse name: Not on file   Number of children: 9   Years of education: 12   Highest education level: 12th grade  Occupational History   Occupation: retired    Associate Professor: RETIRED  Tobacco Use   Smoking status: Never    Passive exposure: Never   Smokeless tobacco: Never  Vaping Use  Vaping status: Never Used  Substance and Sexual Activity   Alcohol use: No   Drug use: No   Sexual activity: Not Currently    Partners: Male  Other Topics Concern   Not on file  Social History Narrative   Lives at home alone.   Right-handed.   2 cups caffeine most days.   17 grandkids   21 great grand kids   Social Determinants of Health   Financial Resource Strain: Low Risk  (02/24/2023)   Overall Financial Resource Strain (CARDIA)    Difficulty of Paying Living Expenses: Not hard at all  Food Insecurity: No Food Insecurity (02/24/2023)   Hunger Vital Sign    Worried About Running Out of Food in the Last Year: Never true    Ran Out of Food in the Last Year: Never true  Transportation Needs: No Transportation Needs (02/24/2023)   PRAPARE - Administrator, Civil Service (Medical): No    Lack of Transportation (Non-Medical): No  Physical Activity: Insufficiently Active (02/24/2023)   Exercise Vital Sign    Days of Exercise per Week: 3 days    Minutes of Exercise per Session: 30 min  Stress: No Stress Concern Present (02/24/2023)   [employer] of Occupational Health - Occupational Stress Questionnaire    Feeling of Stress : Not at all  Social Connections: Unknown (06/08/2023)   Received from [facility], [facility]   Social Network    Social Network: Not on file  Intimate Partner Violence: Unknown (06/08/2023)   Received from [facility], [facility]   HITS    Physically Hurt: Not on file    Insult or Talk Down To: Not on file    Threaten Physical Harm: Not on file    Scream or Curse: Not on file    Review of Systems   General: Negative for anorexia, weight loss, fever, chills, fatigue, weakness. Eyes: Negative for vision changes.  ENT: Negative for hoarseness, difficulty swallowing , nasal congestion. CV: Negative for chest pain, angina, palpitations, dyspnea on exertion, peripheral edema.  Respiratory: Negative for dyspnea at rest, dyspnea on exertion, cough, sputum, wheezing.  GI: See history of present illness. GU:  Negative for dysuria, hematuria, urinary incontinence, urinary frequency, nocturnal urination.  MS: Negative for joint pain, low back pain.  Derm: Negative for rash or itching.  Neuro: Negative for weakness, abnormal sensation, seizure, frequent headaches, memory loss,  confusion.  Psych: Negative for anxiety, depression, suicidal ideation, hallucinations.  Endo: Negative for unusual weight change.  Heme: Negative for bruising or bleeding. Allergy: Negative for rash or hives.  Physical Exam   BP 128/62   Pulse (!) 54   Temp 98.6 F (37 C)   Ht 5' (1.524 m)   Wt 125 lb 6.4 oz (56.9 kg)   BMI 24.49 kg/m    General: Well-nourished, well-developed in no acute distress.  Head: Normocephalic, atraumatic.   Eyes: Conjunctiva pink, no icterus. Mouth: Oropharyngeal mucosa moist and pink  Neck: Supple without thyromegaly, masses, or lymphadenopathy.  Lungs: Clear to auscultation bilaterally.  Heart: Regular rate and rhythm, no murmurs rubs or gallops.  Abdomen: Bowel sounds are normal, nontender, nondistended, no hepatosplenomegaly or masses,  no abdominal bruits or hernia, no rebound or guarding.   Rectal: no external lesions. ?small internal  hemorrhoid palpated. Brown stool hemoccult negative. No masses.  Extremities: No lower extremity edema. No clubbing or deformities.  Neuro: Alert and oriented x 4 , grossly normal neurologically.  Skin: Warm  and dry, no rash or jaundice.   Psych: Alert and cooperative, normal mood and affect.  Labs   Lab Results  Component Value Date   NA 137 01/24/2023   CL 102 01/24/2023   K 5.3 (H) 01/24/2023   CO2 21 01/24/2023   BUN 15 01/24/2023   CREATININE 0.99 01/24/2023   EGFR 54 (L) 01/24/2023   CALCIUM 9.3 01/24/2023   PHOS 3.1 02/01/2016   ALBUMIN 4.2 01/24/2023   GLUCOSE 105 (H) 01/24/2023   Lab Results  Component Value Date   WBC 8.4 01/24/2023   HGB 12.4 01/24/2023   HCT 37.1 01/24/2023   MCV 93 01/24/2023   PLT 213 01/24/2023     Imaging Studies   CT Abdomen Pelvis Wo Contrast  Result Date: 06/22/2023 CLINICAL DATA:  Generalized abdominal pain, tenderness, diarrhea EXAM: CT ABDOMEN AND PELVIS WITHOUT CONTRAST TECHNIQUE: Multidetector CT imaging of the abdomen and pelvis was performed following the standard protocol without IV contrast. Unenhanced CT was performed per clinician order. Lack of IV contrast limits sensitivity and specificity, especially for evaluation of abdominal/pelvic solid viscera. RADIATION DOSE REDUCTION: This exam was performed according to the departmental dose-optimization program which includes automated exposure control, adjustment of the mA and/or kV according to patient size and/or use of iterative reconstruction technique. COMPARISON:  04/23/2019 FINDINGS: Lower chest: No acute pleural or parenchymal lung disease. Calcifications of the mitral annulus and aortic valve. Hepatobiliary: Increased attenuation of the liver parenchyma consistent with history of amiodarone therapy. No focal liver abnormality. Prior cholecystectomy. Pancreas: Unremarkable unenhanced appearance. Spleen: Unremarkable unenhanced appearance. Adrenals/Urinary Tract: No urinary tract  calculi or obstructive uropathy within either kidney. The adrenals and bladder are unremarkable. Stomach/Bowel: No bowel obstruction or ileus. Normal appendix right lower quadrant. Diverticulosis of the sigmoid colon without diverticulitis. No bowel wall thickening or inflammatory change. Small hiatal hernia. Vascular/Lymphatic: Aortic atherosclerosis. No enlarged abdominal or pelvic lymph nodes. Reproductive: Uterus and bilateral adnexa are unremarkable. Other: No free fluid or free intraperitoneal gas. No abdominal wall hernia. Musculoskeletal: No acute or destructive bony abnormalities. Reconstructed images demonstrate no additional findings. IMPRESSION: 1. Sigmoid diverticulosis without diverticulitis. 2. Small hiatal hernia. 3. Increased attenuation of the liver parenchyma consistent with history of amiodarone therapy. 4.  Aortic Atherosclerosis (ICD10-I70.0). Electronically Signed   By: Sharlet Salina M.D.   On: 06/22/2023 19:48    Assessment   *Toilet tissue hematochezia  *ifobt positive  Recent blood noted on tissue paper in setting of diarrhea, hemorrhoids. Last colonoscopy in 2015. On DRE today, likely palpable internal hemorrhoid, stool hemoccult negative. Patient not sure of pursuing any invasive work up unless she has ongoing significant issues.    PLAN   Recheck CBC to look for anemia Repeat ifobt.  Monitor for overt GI bleeding, black or bloody stools.    Leanna Battles. Melvyn Neth, MHS, PA-C [facility]

## 2023-07-18 ENCOUNTER — Ambulatory Visit (INDEPENDENT_AMBULATORY_CARE_PROVIDER_SITE_OTHER): Payer: Medicare Other | Admitting: Gastroenterology

## 2023-07-18 ENCOUNTER — Telehealth: Payer: Self-pay

## 2023-07-18 ENCOUNTER — Encounter: Payer: Self-pay | Admitting: Gastroenterology

## 2023-07-18 DIAGNOSIS — K625 Hemorrhage of anus and rectum: Secondary | ICD-10-CM

## 2023-07-18 DIAGNOSIS — R195 Other fecal abnormalities: Secondary | ICD-10-CM

## 2023-07-18 NOTE — Telephone Encounter (Signed)
Pt's sample was to thick to use. Ran X2 would not read. Will call pt in the morning and advise of this

## 2023-07-19 ENCOUNTER — Telehealth (INDEPENDENT_AMBULATORY_CARE_PROVIDER_SITE_OTHER): Payer: Self-pay | Admitting: *Deleted

## 2023-07-19 ENCOUNTER — Telehealth: Payer: Self-pay

## 2023-07-19 NOTE — Telephone Encounter (Signed)
FYI:   Phoned and advised the pt of what happened with her sample. Advised the pt of picking up another one to do and to make sure it is not too thick. Pt states she is having diarrhea today and she will try and pick up today.

## 2023-07-19 NOTE — Telephone Encounter (Signed)
She needs to repeat FOBT and asked if we could mail to her--wants call back to confirm--Dena will put in mail.

## 2023-07-19 NOTE — Telephone Encounter (Signed)
Let's make sure we only charge her once. Thanks.  Erica [name], can you close the encounter that is open for the ifobt visit yesterday?

## 2023-07-20 NOTE — Progress Notes (Signed)
This has been cancelled

## 2023-07-20 NOTE — Telephone Encounter (Signed)
Pt's stool kit mailed out today

## 2023-07-22 ENCOUNTER — Encounter: Payer: Self-pay | Admitting: Nurse Practitioner

## 2023-07-22 ENCOUNTER — Ambulatory Visit: Payer: Medicare Other | Admitting: Nurse Practitioner

## 2023-07-22 VITALS — BP 151/72 | HR 54 | Temp 97.8°F | Ht 61.0 in | Wt 122.0 lb

## 2023-07-22 DIAGNOSIS — F3341 Major depressive disorder, recurrent, in partial remission: Secondary | ICD-10-CM | POA: Diagnosis not present

## 2023-07-22 DIAGNOSIS — E782 Mixed hyperlipidemia: Secondary | ICD-10-CM

## 2023-07-22 DIAGNOSIS — I2510 Atherosclerotic heart disease of native coronary artery without angina pectoris: Secondary | ICD-10-CM | POA: Diagnosis not present

## 2023-07-22 DIAGNOSIS — I10 Essential (primary) hypertension: Secondary | ICD-10-CM | POA: Diagnosis not present

## 2023-07-22 DIAGNOSIS — E039 Hypothyroidism, unspecified: Secondary | ICD-10-CM

## 2023-07-22 DIAGNOSIS — M810 Age-related osteoporosis without current pathological fracture: Secondary | ICD-10-CM

## 2023-07-22 DIAGNOSIS — R4589 Other symptoms and signs involving emotional state: Secondary | ICD-10-CM | POA: Diagnosis not present

## 2023-07-22 DIAGNOSIS — K219 Gastro-esophageal reflux disease without esophagitis: Secondary | ICD-10-CM | POA: Diagnosis not present

## 2023-07-22 DIAGNOSIS — K3184 Gastroparesis: Secondary | ICD-10-CM

## 2023-07-22 DIAGNOSIS — Z6823 Body mass index (BMI) 23.0-23.9, adult: Secondary | ICD-10-CM | POA: Diagnosis not present

## 2023-07-22 MED ORDER — AMLODIPINE BESYLATE 5 MG PO TABS
5.0000 mg | ORAL_TABLET | Freq: Two times a day (BID) | ORAL | 1 refills | Status: DC
Start: 2023-07-22 — End: 2024-01-24

## 2023-07-22 MED ORDER — ESOMEPRAZOLE MAGNESIUM 40 MG PO CPDR
DELAYED_RELEASE_CAPSULE | ORAL | 1 refills | Status: DC
Start: 2023-07-22 — End: 2024-01-24

## 2023-07-22 MED ORDER — LEVOTHYROXINE SODIUM 88 MCG PO TABS
ORAL_TABLET | ORAL | 1 refills | Status: DC
Start: 2023-07-22 — End: 2024-01-24

## 2023-07-22 MED ORDER — FUROSEMIDE 20 MG PO TABS
20.0000 mg | ORAL_TABLET | Freq: Every day | ORAL | 1 refills | Status: DC
Start: 2023-07-22 — End: 2024-01-24

## 2023-07-22 MED ORDER — ESCITALOPRAM OXALATE 20 MG PO TABS: 40.0000 mg | ORAL_TABLET | Freq: Every day | ORAL | 1 refills | Status: DC

## 2023-07-22 MED ORDER — ALENDRONATE SODIUM 70 MG PO TABS
ORAL_TABLET | ORAL | 3 refills | Status: DC
Start: 2023-07-22 — End: 2024-07-24

## 2023-07-22 MED ORDER — ATORVASTATIN CALCIUM 10 MG PO TABS: ORAL_TABLET | ORAL | 0 refills | Status: DC

## 2023-07-22 MED ORDER — OLMESARTAN MEDOXOMIL 40 MG PO TABS
40.0000 mg | ORAL_TABLET | Freq: Every day | ORAL | 1 refills | Status: DC
Start: 2023-07-22 — End: 2024-01-24

## 2023-07-22 MED ORDER — AMIODARONE HCL 200 MG PO TABS
200.0000 mg | ORAL_TABLET | Freq: Every day | ORAL | 1 refills | Status: DC
Start: 2023-07-22 — End: 2024-01-24

## 2023-07-22 NOTE — Patient Instructions (Signed)
Gastroparesis  Gastroparesis is a condition in which food takes longer than normal to empty from the stomach. This condition is also known as delayed gastric emptying. It is usually a long-term (chronic) condition. There is no cure, but there are treatments and things that you can do at home to help relieve symptoms. Treating the underlying condition that causes gastroparesis can also help relieve symptoms. What are the causes? In many cases, the cause of this condition is not known. Possible causes include: A hormone (endocrine) disorder, such as hypothyroidism or diabetes. A nervous system disease, such as Parkinson's disease or multiple sclerosis. Cancer, infection, or surgery that affects the stomach or vagus nerve. The vagus nerve runs from your chest, through your neck, and to the lower part of your brain. A connective tissue disorder, such as scleroderma. Certain medicines. What increases the risk? You are more likely to develop this condition if: You have certain disorders or diseases. These may include: An endocrine disorder. An eating disorder. Amyloidosis. Scleroderma. Parkinson's disease. Multiple sclerosis. Cancer or infection of the stomach or the vagus nerve. You have had surgery on your stomach or vagus nerve. You take certain medicines. You are female. What are the signs or symptoms? Symptoms of this condition include: Feeling full after eating very little or a loss of appetite. Nausea, vomiting, or heartburn. Bloating of your abdomen. Inconsistent blood sugar (glucose) levels on blood tests. Unexplained weight loss. Acid from the stomach coming up into the esophagus (gastroesophageal reflux). Sudden tightening (spasm) of the stomach, which can be painful. Symptoms may come and go. Some people may not notice any symptoms. How is this diagnosed? This condition is diagnosed with tests, such as: Tests that check how long it takes food to move through the stomach and  intestines. These tests include: Upper gastrointestinal (GI) series. For this test, you drink a liquid that shows up well on X-rays, and then X-rays are taken of your intestines. Gastric emptying scintigraphy. For this test, you eat food that contains a small amount of radioactive material, and then scans are taken. Wireless capsule GI monitoring system. For this test, you swallow a pill (capsule) that records information about how foods and fluid move through your stomach. Gastric manometry. For this test, a tube is passed down your throat and into your stomach to measure electrical and muscular activity. Endoscopy. For this test, a long, thin tube with a camera and light on the end is passed down your throat and into your stomach to check for problems in your stomach lining. Ultrasound. This test uses sound waves to create images of the inside of your body. This can help rule out gallbladder disease or pancreatitis as a cause of your symptoms. How is this treated? There is no cure for this condition, but treatment and home care may relieve symptoms. Treatment may include: Treating the underlying cause. Managing your symptoms by making changes to your diet and exercise habits. Taking medicines to control nausea and vomiting and to stimulate stomach muscles. Getting food through a feeding tube in the hospital. This may be done in severe cases. Having surgery to insert a device called a gastric electrical stimulator into your body. This device helps improve stomach emptying and control nausea and vomiting. Follow these instructions at home: Take over-the-counter and prescription medicines only as told by your health care provider. Follow instructions from your health care provider about eating or drinking restrictions. Your health care provider may recommend that you: Eat smaller meals more often. Eat  low-fat foods. Eat low-fiber forms of high-fiber foods. For example, eat cooked vegetables instead  of raw vegetables. Have only liquid foods instead of solid foods. Liquid foods are easier to digest. Drink enough fluid to keep your urine pale yellow. Exercise as often as told by your health care provider. Keep all follow-up visits. This is important. Contact a health care provider if you: Notice that your symptoms do not improve with treatment. Have new symptoms. Get help right away if you: Have severe pain in your abdomen that does not improve with treatment. Have nausea that is severe or does not go away. Vomit every time you drink fluids. Summary Gastroparesis is a long-term (chronic) condition in which food takes longer than normal to empty from the stomach. Symptoms include nausea, vomiting, heartburn, bloating of your abdomen, and loss of appetite. Eating smaller portions, low-fat foods, and low-fiber forms of high-fiber foods may help you manage your symptoms. Get help right away if you have severe pain in your abdomen. This information is not intended to replace advice given to you by your health care provider. Make sure you discuss any questions you have with your health care provider. Document Revised: 04/21/2020 Document Reviewed: 04/21/2020 Elsevier Patient Education  2024 [employer].

## 2023-07-22 NOTE — Progress Notes (Signed)
Subjective:    Patient ID: Erica [name], female    DOB: 1930-05-05, 89 y.o.   MRN: [id number]   Chief Complaint: Medical Management of Chronic Issues    HPI:  Erica [name] is a 89 y.o. who identifies as a female who was assigned female at birth.   Social history: Lives with: by herself. Her kids check on her daily Work history: retired   Water engineer in today for follow up of the following chronic medical issues:  1. Primary hypertension No c/o chest pain, sob or headache. Does not check blood pressure at home. BP Readings from Last 3 Encounters:  07/22/23 (!) 151/72  07/13/23 128/62  06/22/23 (!) 152/64     2. Coronary artery disease involving native coronary artery of native heart without angina pectoris Sees Dr. Sharyn Lull every 6 months. Is doing well. No changes to plan of care since last visit.is on eliquis and pacerone with no issues  3. Gastroparesis Has poor appetite due  to this.  4. Gastroesophageal reflux disease without esophagitis I son nexium daily an dis doing well.  5. Acquired hypothyroidism No issues that she is aware of Lab Results  Component Value Date   TSH 0.726 05/31/2022    6. Mixed hyperlipidemia Has a very poor appetite. Eats whatever she wants to. Does not do any exercise. Lab Results  Component Value Date   CHOL 164 01/24/2023   HDL 59 01/24/2023   LDLCALC 88 01/24/2023   TRIG 93 01/24/2023   CHOLHDL 2.8 01/24/2023     7. Anxiety about health Worries about everything    07/22/2023    3:34 PM 06/22/2023    3:17 PM 01/24/2023    4:20 PM 11/01/2022    3:24 PM  GAD 7 : Generalized Anxiety Score  Nervous, Anxious, on Edge 1 0 1 0  Control/stop worrying 0 0 0 0  Worry too much - different things 1 0 1 0  Trouble relaxing 0 0 0 0  Restless 0 0 0 0  Easily annoyed or irritable 0 0 0 0  Afraid - awful might happen 0 0 1 0  Total GAD 7 Score 2 0 3 0  Anxiety Difficulty Not difficult at all Not difficult at all Not difficult at all  Not difficult at all       8. Recurrent major depressive disorder, in partial remission (HCC) Is on lexapro an dis doing well.    07/22/2023    3:33 PM 06/22/2023    3:17 PM 01/24/2023    4:19 PM  Depression screen PHQ 2/9  Decreased Interest 0 0 0  Down, Depressed, Hopeless 2 0 0  PHQ - 2 Score 2 0 0  Altered sleeping 0 0 2  Tired, decreased energy 1 0 0  Change in appetite 1 0 0  Feeling bad or failure about yourself  0 0 0  Trouble concentrating 0 0 0  Moving slowly or fidgety/restless 0 0 0  Suicidal thoughts 0 0 0  PHQ-9 Score 4 0 2  Difficult doing work/chores Not difficult at all Not difficult at all Not difficult at all     9. BMI 24.0-24.9,adult Wt Readings from Last 3 Encounters:  07/22/23 122 lb (55.3 kg)  07/13/23 125 lb 6.4 oz (56.9 kg)  06/22/23 122 lb (55.3 kg)   BMI Readings from Last 3 Encounters:  07/22/23 23.05 kg/m  07/13/23 24.49 kg/m  06/22/23 23.05 kg/m      New complaints: None today  Allergies  Allergen Reactions   Contrast Media [Iodinated Contrast Media] Shortness Of Breath, Nausea Only and Swelling   Iodine Shortness Of Breath, Nausea Only and Swelling   Iohexol Hives and Swelling     Desc: hives, swelling over 5 yrs ago-pt has never been pre-medicated--was told to not have iv contrast ever    Ioxaglate Nausea Only, Shortness Of Breath and Swelling   Bactrim [Sulfamethoxazole-Trimethoprim] Nausea And Vomiting   Metoclopramide Other (See Comments)    Mouth tremors, insomnia, and irritability   Outpatient Encounter Medications as of 07/22/2023  Medication Sig   alendronate (FOSAMAX) 70 MG tablet TAKE 1 TABLET ONCE A WEEK ON AN EMPTY STOMACH WITH A FULL GLASS OF WATER   amiodarone (PACERONE) 200 MG tablet TAKE ONE TABLET EVERY DAY   amLODipine (NORVASC) 5 MG tablet Take 1 tablet (5 mg total) by mouth 2 (two) times daily.   apixaban (ELIQUIS) 2.5 MG TABS tablet TAKE (1) TABLET TWICE A DAY.   atorvastatin (LIPITOR) 10 MG tablet  TAKE ONE TABLET ONCE DAILY AT 6PM   cephALEXin (KEFLEX) 250 MG capsule Take 250 mg by mouth at bedtime.   Cholecalciferol (VITAMIN D3) 125 MCG (5000 UT) CAPS Take 1 capsule by mouth every other day.   diclofenac Sodium (VOLTAREN) 1 % GEL Apply 4 g topically 4 (four) times daily.   escitalopram (LEXAPRO) 20 MG tablet Take 2 tablets (40 mg total) by mouth daily.   esomeprazole (NEXIUM) 40 MG capsule TAKE ONE CAPSULE TWICE DAILY BEFORE MEALS   fluticasone (FLONASE) 50 MCG/ACT nasal spray Place 2 sprays into both nostrils daily.   furosemide (LASIX) 20 MG tablet TAKE ONE TABLET ONCE DAILY   hydrocortisone (ANUSOL-HC) 2.5 % rectal cream Place 1 application rectally 2 (two) times daily. (Patient taking differently: Place 1 application  rectally 2 (two) times daily as needed for hemorrhoids or anal itching.)   levothyroxine (SYNTHROID) 88 MCG tablet TAKE (1) TABLET DAILY BE- FORE BREAKFAST.   Multiple Vitamin (MULTIVITAMIN WITH MINERALS) TABS tablet Take 1 tablet by mouth daily.   olmesartan (BENICAR) 40 MG tablet TAKE ONE TABLET ONCE DAILY   Pediatric Multiple Vitamins (FLINTSTONES PLUS EXTRA C) CHEW Chew by mouth.   polyethylene glycol (MIRALAX / GLYCOLAX) 17 g packet Take 17 g by mouth daily.   Wheat Dextrin (BENEFIBER) POWD Take 1 Dose by mouth daily. Take daily   No facility-administered encounter medications on file as of 07/22/2023.    Past Surgical History:  Procedure Laterality Date   CARDIAC CATHETERIZATION N/A 02/03/2016   Procedure: Coronary/Graft Angiography;  Surgeon: Rinaldo Cloud, MD;  Location: [facility];  Service: Cardiovascular;  Laterality: N/A;   CATARACT EXTRACTION     CHOLECYSTECTOMY     COLONOSCOPY     Multiple, polyps   ESOPHAGEAL MANOMETRY N/A 06/14/2016   Procedure: ESOPHAGEAL MANOMETRY (EM);  Surgeon: Iva Boop, MD;  Location: [facility];  Service: Endoscopy;  Laterality: N/A;   LEFT HEART CATHETERIZATION WITH CORONARY ANGIOGRAM N/A 07/17/2013    Procedure: LEFT HEART CATHETERIZATION WITH CORONARY ANGIOGRAM;  Surgeon: Robynn Pane, MD;  Location: [facility];  Service: Cardiovascular;  Laterality: N/A;   SIGMOIDOSCOPY  08/23/2011   TUBAL LIGATION     UPPER GASTROINTESTINAL ENDOSCOPY  08/23/2011   Normal    Family History  Problem Relation Age of Onset   Stomach cancer Brother    Colon cancer Paternal Aunt    Lung cancer Brother        smoker  Esophageal cancer Father    Stroke Mother       Controlled substance contract: n/a     Review of Systems  Constitutional:  Negative for diaphoresis.  Eyes:  Negative for pain.  Respiratory:  Negative for shortness of breath.   Cardiovascular:  Negative for chest pain, palpitations and leg swelling.  Gastrointestinal:  Negative for abdominal pain.  Endocrine: Negative for polydipsia.  Skin:  Negative for rash.  Neurological:  Negative for dizziness, weakness and headaches.  Hematological:  Does not bruise/bleed easily.  All other systems reviewed and are negative.      Objective:   Physical Exam Vitals and nursing note reviewed.  Constitutional:      General: She is not in acute distress.    Appearance: Normal appearance. She is well-developed.  HENT:     Head: Normocephalic.     Right Ear: Tympanic membrane normal.     Left Ear: Tympanic membrane normal.     Nose: Nose normal.     Mouth/Throat:     Mouth: Mucous membranes are moist.  Eyes:     Pupils: Pupils are equal, round, and reactive to light.  Neck:     Vascular: No carotid bruit or JVD.  Cardiovascular:     Rate and Rhythm: Normal rate and regular rhythm.     Heart sounds: Normal heart sounds.  Pulmonary:     Effort: Pulmonary effort is normal. No respiratory distress.     Breath sounds: Normal breath sounds. No wheezing or rales.  Chest:     Chest wall: No tenderness.  Abdominal:     General: Bowel sounds are normal. There is no distension or abdominal bruit.     Palpations: Abdomen is soft.  There is no hepatomegaly, splenomegaly, mass or pulsatile mass.     Tenderness: There is no abdominal tenderness.  Musculoskeletal:        General: Normal range of motion.     Cervical back: Normal range of motion and neck supple.     Comments: Gait slow but steady  Lymphadenopathy:     Cervical: No cervical adenopathy.  Skin:    General: Skin is warm and dry.  Neurological:     Mental Status: She is alert and oriented to person, place, and time.     Deep Tendon Reflexes: Reflexes are normal and symmetric.  Psychiatric:        Mood and Affect: Mood normal.        Behavior: Behavior normal.        Thought Content: Thought content normal.        Judgment: Judgment normal.    BP (!) 151/72   Pulse (!) 54   Temp 97.8 F (36.6 C) (Temporal)   Ht 5\' 1"  (1.549 m)   Wt 122 lb (55.3 kg)   SpO2 94%   BMI 23.05 kg/m         Assessment & Plan:  Erica [name] comes in today with chief complaint of Medical Management of Chronic Issues   Diagnosis and orders addressed:  1. Primary hypertension Low sodium diet - amLODipine (NORVASC) 5 MG tablet; Take 1 tablet (5 mg total) by mouth 2 (two) times daily.  Dispense: 180 tablet; Refill: 1 - furosemide (LASIX) 20 MG tablet; Take 1 tablet (20 mg total) by mouth daily.  Dispense: 90 tablet; Refill: 1 - olmesartan (BENICAR) 40 MG tablet; Take 1 tablet (40 mg total) by mouth daily.  Dispense: 90 tablet; Refill: 1 - CBC  with Differential/Platelet - CMP14+EGFR - Lipid panel  2. Coronary artery disease involving native coronary artery of native heart without angina pectoris Keep follow up with cardiology - amiodarone (PACERONE) 200 MG tablet; Take 1 tablet (200 mg total) by mouth daily.  Dispense: 90 tablet; Refill: 1  3. Gastroparesis Eat 6 small meals a day  4. Gastroesophageal reflux disease without esophagitis Avoid spicy foods Do not eat 2 hours prior to bedtime  - esomeprazole (NEXIUM) 40 MG capsule; TAKE ONE CAPSULE TWICE  DAILY BEFORE MEALS  Dispense: 180 capsule; Refill: 1  5. Acquired hypothyroidism Labs pending - levothyroxine (SYNTHROID) 88 MCG tablet; TAKE (1) TABLET DAILY BE- FORE BREAKFAST.  Dispense: 90 tablet; Refill: 1 - Thyroid Panel With TSH  6. Mixed hyperlipidemia Low fat diet - atorvastatin (LIPITOR) 10 MG tablet; TAKE ONE TABLET ONCE DAILY AT 6PM  Dispense: 90 tablet; Refill: 0  7. Anxiety about health Stress management  8. Recurrent major depressive disorder, in partial remission (HCC) - escitalopram (LEXAPRO) 20 MG tablet; Take 2 tablets (40 mg total) by mouth daily.  Dispense: 180 tablet; Refill: 1  9. BMI 23.0-23.9, adult Do not lose anymore weight    10. Age-related osteoporosis without current pathological fracture Weight bearing exercises when can tolerate - alendronate (FOSAMAX) 70 MG tablet; TAKE 1 TABLET ONCE A WEEK ON AN EMPTY STOMACH WITH A FULL GLASS OF WATER  Dispense: 12 tablet; Refill: 3   Labs pending Health Maintenance reviewed Diet and exercise encouraged  Follow up plan: 6 months   Mary-Margaret Daphine Deutscher, [name]

## 2023-07-25 ENCOUNTER — Ambulatory Visit: Payer: Medicare Other | Admitting: Nurse Practitioner

## 2023-07-28 NOTE — Telephone Encounter (Signed)
Daughter returned stool card. The kits are not valid. We are having to get new kits sent to [place]. Spoke with Verlon Au and options are 1) can send hemoccult card to do at home (can be mailed back to [place]) or 2) monitor for any bleeding.  Spoke with pt and discussed with her options. She is fine with doing hemoccult card, She will let us know when she sends this back to [place].

## 2023-08-03 NOTE — Telephone Encounter (Signed)
Yes I did send this out to patient in the mail.

## 2023-08-03 NOTE — Telephone Encounter (Signed)
Tammy/Mindy (not sure who I was speaking with about this), we discussed last week sending out hemoccults since ifobt on back order. Do we know if this was done?

## 2023-08-03 NOTE — Telephone Encounter (Signed)
Mindy mailed it out last week.

## 2023-08-11 ENCOUNTER — Ambulatory Visit (INDEPENDENT_AMBULATORY_CARE_PROVIDER_SITE_OTHER): Payer: Medicare Other | Admitting: Gastroenterology

## 2023-08-11 ENCOUNTER — Other Ambulatory Visit: Payer: Self-pay

## 2023-08-11 DIAGNOSIS — K625 Hemorrhage of anus and rectum: Secondary | ICD-10-CM

## 2023-08-11 LAB — POC HEMOCCULT BLD/STL (HOME/3-CARD/SCREEN)
Card #2 Fecal Occult Blod, POC: NEGATIVE
Card #3 Fecal Occult Blood, POC: NEGATIVE
Fecal Occult Blood, POC: NEGATIVE

## 2023-08-11 NOTE — Progress Notes (Signed)
Hemoccults returned

## 2023-08-22 ENCOUNTER — Telehealth: Payer: Self-pay

## 2023-08-22 NOTE — Telephone Encounter (Signed)
Pt called regarding her stool samples that she dropped off. Pt would like to know results/recommendations.

## 2023-08-22 NOTE — Telephone Encounter (Signed)
See result note.  

## 2023-08-23 ENCOUNTER — Other Ambulatory Visit: Payer: Self-pay | Admitting: Nurse Practitioner

## 2023-08-23 DIAGNOSIS — I2510 Atherosclerotic heart disease of native coronary artery without angina pectoris: Secondary | ICD-10-CM

## 2023-11-02 DIAGNOSIS — L814 Other melanin hyperpigmentation: Secondary | ICD-10-CM | POA: Diagnosis not present

## 2023-11-02 DIAGNOSIS — Z08 Encounter for follow-up examination after completed treatment for malignant neoplasm: Secondary | ICD-10-CM | POA: Diagnosis not present

## 2023-11-02 DIAGNOSIS — L821 Other seborrheic keratosis: Secondary | ICD-10-CM | POA: Diagnosis not present

## 2023-11-02 DIAGNOSIS — L718 Other rosacea: Secondary | ICD-10-CM | POA: Diagnosis not present

## 2023-11-02 DIAGNOSIS — D225 Melanocytic nevi of trunk: Secondary | ICD-10-CM | POA: Diagnosis not present

## 2023-11-02 DIAGNOSIS — Z7189 Other specified counseling: Secondary | ICD-10-CM | POA: Diagnosis not present

## 2023-11-02 DIAGNOSIS — L570 Actinic keratosis: Secondary | ICD-10-CM | POA: Diagnosis not present

## 2023-11-02 DIAGNOSIS — Z85828 Personal history of other malignant neoplasm of skin: Secondary | ICD-10-CM | POA: Diagnosis not present

## 2023-11-07 ENCOUNTER — Other Ambulatory Visit: Payer: Self-pay | Admitting: Nurse Practitioner

## 2023-11-07 DIAGNOSIS — E782 Mixed hyperlipidemia: Secondary | ICD-10-CM

## 2023-12-27 ENCOUNTER — Telehealth: Payer: Self-pay

## 2023-12-27 DIAGNOSIS — E785 Hyperlipidemia, unspecified: Secondary | ICD-10-CM | POA: Diagnosis not present

## 2023-12-27 DIAGNOSIS — Z79890 Hormone replacement therapy (postmenopausal): Secondary | ICD-10-CM | POA: Diagnosis not present

## 2023-12-27 DIAGNOSIS — U071 COVID-19: Secondary | ICD-10-CM | POA: Diagnosis not present

## 2023-12-27 DIAGNOSIS — I10 Essential (primary) hypertension: Secondary | ICD-10-CM | POA: Diagnosis not present

## 2023-12-27 DIAGNOSIS — R0602 Shortness of breath: Secondary | ICD-10-CM | POA: Diagnosis not present

## 2023-12-27 DIAGNOSIS — Z91041 Radiographic dye allergy status: Secondary | ICD-10-CM | POA: Diagnosis not present

## 2023-12-27 NOTE — Telephone Encounter (Signed)
 Copied from CRM [id number]. Topic: Clinical - Medication Question >> Dec 27, 2023  8:39 AM Powell HERO wrote: Reason for CRM: Vina patients daughter, she states the patient tested positive last night at home. She was wondering if there could be a prescription called in for her mother to help relieve some of her symptoms. Please reach out to Vina they would like it to go to [facility] pharmacy if possible. [phone] Vina.

## 2024-01-02 ENCOUNTER — Telehealth: Payer: Self-pay

## 2024-01-02 NOTE — Telephone Encounter (Signed)
 Called to schedule appt and pt didn't want to schedule right now. She will call back to schedule when she can get a ride

## 2024-01-02 NOTE — Telephone Encounter (Signed)
 Copied from CRM [id number]. Topic: Clinical - Medical Advice >> Jan 02, 2024  1:56 PM Delon DASEN wrote: Reason for CRM: having an issue, hearing noise in left ear and it is keeping her awake, feels like it is full of fluid, please call patient [phone] asking for Mary-Margaret

## 2024-01-03 ENCOUNTER — Ambulatory Visit: Payer: Self-pay | Admitting: Nurse Practitioner

## 2024-01-03 ENCOUNTER — Encounter: Payer: Self-pay | Admitting: Nurse Practitioner

## 2024-01-03 ENCOUNTER — Ambulatory Visit: Payer: Medicare Other | Admitting: Nurse Practitioner

## 2024-01-03 VITALS — BP 131/66 | HR 61 | Ht 61.0 in | Wt 118.0 lb

## 2024-01-03 DIAGNOSIS — H93232 Hyperacusis, left ear: Secondary | ICD-10-CM

## 2024-01-03 DIAGNOSIS — H6592 Unspecified nonsuppurative otitis media, left ear: Secondary | ICD-10-CM

## 2024-01-03 MED ORDER — FLUTICASONE PROPIONATE 50 MCG/ACT NA SUSP
2.0000 | Freq: Every day | NASAL | 6 refills | Status: DC
Start: 2024-01-03 — End: 2024-07-19

## 2024-01-03 NOTE — Progress Notes (Signed)
   Subjective:    Erica [name] ID: Erica [name], female    DOB: [birth year], 89 y.o.   MRN: [id number]   Chief Complaint: Hearing music in left ear   HPI  Erica [name] comes in c/o hearing music in Erica [name] left ear. Started about 2-3 days ago.  Erica [name] Active Problem List   Diagnosis Date Noted   Coronary artery disease involving native coronary artery of native heart without angina pectoris 05/31/2022   Age-related osteoporosis without current pathological fracture 02/19/2021   Perianal dermatitis 05/15/2020   Anxiety about health 05/09/2019   Left foot drop 08/25/2016   Internal bleeding hemorrhoids 01/01/2016   Hypothyroidism 07/07/2015   BMI 27.0-27.9,adult 07/07/2015   HTN (hypertension) 12/13/2012   Hyperlipidemia 12/13/2012   Depression 06/06/2012   Gastroparesis 02/17/2012   History of colon polyps 08/06/2011   GERD (gastroesophageal reflux disease) 08/06/2011       Review of Systems  Constitutional:  Negative for diaphoresis.  HENT:  Negative for ear discharge and ear pain.   Eyes:  Negative for pain.  Respiratory:  Negative for shortness of breath.   Cardiovascular:  Negative for chest pain, palpitations and leg swelling.  Gastrointestinal:  Negative for abdominal pain.  Endocrine: Negative for polydipsia.  Skin:  Negative for rash.  Neurological:  Negative for dizziness, weakness and headaches.  Hematological:  Does not bruise/bleed easily.  All other systems reviewed and are negative.      Objective:   Physical Exam Constitutional:      Appearance: Normal appearance.  Cardiovascular:     Rate and Rhythm: Normal rate.     Heart sounds: Normal heart sounds.  Pulmonary:     Effort: Pulmonary effort is normal.     Breath sounds: Normal breath sounds.  Skin:    General: Skin is warm.  Neurological:     General: No focal deficit present.     Mental Status: Erica [name] is alert and oriented to person, place, and time.  Psychiatric:        Mood and Affect: Mood normal.         Behavior: Behavior normal.    BP 131/66   Pulse 61   Ht 5' 1 (1.549 m)   Wt 118 lb (53.5 kg)   BMI 22.30 kg/m          Assessment & Plan:   Erica [name] in today with chief complaint of Hearing music in left ear   1. Fluid level behind tympanic membrane of left ear (Primary) - fluticasone  (FLONASE ) 50 MCG/ACT nasal spray; Place 2 sprays into both nostrils daily.  Dispense: 16 g; Refill: 6  2. Hyperacusis of left ear Will need to see audiology  if does not improve RTO prn    The above assessment and management plan was discussed with the Erica [name]. The Erica [name] verbalized understanding of and has agreed to the management plan. Erica [name] is aware to call the clinic if symptoms persist or worsen. Erica [name] is aware when to return to the clinic for a follow-up visit. Erica [name] educated on when it is appropriate to go to the emergency department.   Mary-Margaret Gladis, [name]

## 2024-01-03 NOTE — Telephone Encounter (Signed)
 Spoke with patient and scheduled her an appointment to come in today at 3:30 pm today to see Erica [name].

## 2024-01-03 NOTE — Patient Instructions (Signed)
 Tinnitus Tinnitus is when you hear a sound that there's no actual source for. It may sound like ringing in your ears or something else. The sound may be loud, soft, or somewhere in between. It can last for a few seconds or be constant for days. It can come and go. Almost everyone has tinnitus at some point. It's not the same as hearing loss. But you may need to see a health care provider if: It lasts for a long time. It comes back often. You have trouble sleeping and focusing. What are the causes? The cause of tinnitus is often unknown. In some cases, you may get it if: You're around loud noises, such as from machines or music. An object gets stuck in your ear. Earwax builds up in Landscape architect. You drink a lot of alcohol or caffeine. You take certain medicines. You start to lose your hearing. You may also get it from some medical conditions. These may include: Ear or sinus infections. Heart diseases. High blood pressure. Allergies. Mnire's disease. Problems with your thyroid. A tumor. This is a growth of cells that isn't normal. A weak, bulging blood vessel called an aneurysm near your ear. What increases the risk? You may be more likely to get tinnitus if: You're around loud noises a lot. You're older. You drink alcohol. You smoke. What are the signs or symptoms? The main symptom is hearing a sound that there's no source for. It may sound like ringing. It may also sound like: Buzzing. Sizzling. Blowing air. Hissing. Whistling. Other sounds may include: Roaring. Running water. A musical note. Tapping. Humming. You may have symptoms in one ear or both ears. How is this diagnosed? Tinnitus is diagnosed based on your symptoms, your medical history, and an exam. Your provider may do a full hearing test if your tinnitus: Is in just one ear. Makes it hard for you to hear. Lasts 6 months or longer. You may also need to see an expert in hearing disorders called an audiologist.  They may ask you about your symptoms and how tinnitus affects your daily life. You may have other tests done. These may include: A CT scan. An MRI. An angiogram. This shows how blood flows through your blood vessels. How is this treated? Treatment may include: Therapy to help you manage the stress of living with tinnitus. Finding ways to mask or cover the sound of tinnitus. These include: Sound or white noise machines. Devices that fit in your ear and play sounds or music. A loud humidifier. Acoustic neural stimulation. This is when you use headphones to listen to music that has a special signal in it. Over time, this signal may change some of the pathways in your brain. This can make you less sensitive to tinnitus. This treatment is used for very severe cases. Using hearing aids or cochlear implants if your tinnitus is from hearing loss. If your tinnitus is caused by a medical condition, treating the condition may make it go away.  Follow these instructions at home: Managing symptoms     Try to avoid being in loud places or around loud noises. Wear earplugs or headphones when you're around loud noises. Find ways to reduce stress. These may include meditation, yoga, or deep breathing. Sleep with your head slightly raised. General instructions Take over-the-counter and prescription medicines only as told by your provider. Track the things that cause symptoms (triggers). Try to avoid these things. To stop your tinnitus from getting worse: Do not drink alcohol. Do  not have caffeine. Do not use any products that contain nicotine or tobacco. These products include cigarettes, chewing tobacco, and vaping devices, such as e-cigarettes. If you need help quitting, ask your provider. Avoid using too much salt. Get enough sleep each night. Where to find more information American Tinnitus Association: https://www.johnson-hamilton.org/ Contact a health care provider if: Your symptoms last for 3 weeks or longer without  stopping. You have sudden hearing loss. Your symptoms get worse or don't get better with home care. You can't manage the stress of living with tinnitus. Get help right away if: You get tinnitus after a head injury. You have tinnitus and: Dizziness. Nausea and vomiting. Loss of balance. A sudden, severe headache. Changes to your eyesight. Weakness in your face, arms, or legs. These symptoms may be an emergency. Get help right away. Call 911. Do not wait to see if the symptoms will go away. Do not drive yourself to the hospital. This information is not intended to replace advice given to you by your health care provider. Make sure you discuss any questions you have with your health care provider. Document Revised: 03/21/2023 Document Reviewed: 03/21/2023 Elsevier Patient Education  2024 [employer].

## 2024-01-03 NOTE — Telephone Encounter (Addendum)
 This RN spoke with Jan, RN through [facility]- she states she is going to f/u with the patient and PCP.  Chief Complaint: ear pain Symptoms: earache, congestion, ear fullness, sinus drainage Frequency: Ongoing, worse over last 4 days Pertinent Negatives: Patient denies new weakness or numbness, visual changes Disposition: [] ED /[x] Urgent Care (no appt availability in office) / [] Appointment(In office/virtual)/ []  [place] [place] [facility]/ [] Home Care/ [] Refused Recommended Disposition /[] [place] [facility]/ []  Follow-up with PCP Additional Notes: Patient called stating she is having severe pain in her L ear that has been ongoing but worsening over the last 4 days. Patient reports this song is playing on repeat in my ear. She states her ear feels full and she has pain in the L side of her face, runny nose, cough. Pt repeatedly asks to speak with PCP for direction. States she was recently diagnosed with covid. Pt states she feels weak, but this is not new, and unsteady on her feet. She states she called EMS last night and was advised to follow up with provider. Per protocol, pt to be evaluated within 4 hours, next available appt 01/04/24 @ 0830. This RN advised patient to go to urgent care for evaluation, patient verbalized understanding but then requested contact from PCP. This RN attempted to contact patients emergency contacts (all three) to verify patient had transport to urgent care unsuccessfully. Care advice reviewed with patient. Alerting PCP for review and follow up.   Copied from CRM [id number]. Topic: Clinical - Red Word Triage >> Jan 03, 2024 10:46 AM Ivette P wrote: [facility] that prompted transfer to Nurse Triage: Severe pain in the left ear. Reason for Disposition  Walking is very unsteady or feels very dizzy  Answer Assessment - Initial Assessment Questions 1. LOCATION: Which ear is involved?     Left ear- TMJ 2. ONSET: When did the ear start hurting      Fluid for a while, 4 days this  episode 3. SEVERITY: How bad is the pain?  (Scale 1-10; mild, moderate or severe)   - MILD (1-3): doesn't interfere with normal activities    - MODERATE (4-7): interferes with normal activities or awakens from sleep    - SEVERE (8-10): excruciating pain, unable to do any normal activities      Denies pain, reports fullness, feels like fluid 4. URI SYMPTOMS: Do you have a runny nose or cough?     Runny nose, cough, L side of head congestion 5. FEVER: Do you have a fever? If Yes, ask: What is your temperature, how was it measured, and when did it start?     Mild now and then  7. OTHER SYMPTOMS: Do you have any other symptoms? (e.g., headache, stiff neck, dizziness, vomiting, runny nose, decreased hearing)     Plays the same song over and over,  Nauseous, decreased hearing  Protocols used: Earache-A-AH

## 2024-01-24 ENCOUNTER — Ambulatory Visit: Payer: Medicare Other | Admitting: Nurse Practitioner

## 2024-01-24 ENCOUNTER — Encounter: Payer: Self-pay | Admitting: Nurse Practitioner

## 2024-01-24 VITALS — BP 132/70 | HR 57 | Temp 97.2°F | Ht 61.0 in | Wt 119.0 lb

## 2024-01-24 DIAGNOSIS — E782 Mixed hyperlipidemia: Secondary | ICD-10-CM | POA: Diagnosis not present

## 2024-01-24 DIAGNOSIS — K3184 Gastroparesis: Secondary | ICD-10-CM | POA: Diagnosis not present

## 2024-01-24 DIAGNOSIS — Z23 Encounter for immunization: Secondary | ICD-10-CM

## 2024-01-24 DIAGNOSIS — F3341 Major depressive disorder, recurrent, in partial remission: Secondary | ICD-10-CM

## 2024-01-24 DIAGNOSIS — I2510 Atherosclerotic heart disease of native coronary artery without angina pectoris: Secondary | ICD-10-CM

## 2024-01-24 DIAGNOSIS — Z6827 Body mass index (BMI) 27.0-27.9, adult: Secondary | ICD-10-CM | POA: Diagnosis not present

## 2024-01-24 DIAGNOSIS — H9313 Tinnitus, bilateral: Secondary | ICD-10-CM | POA: Diagnosis not present

## 2024-01-24 DIAGNOSIS — E039 Hypothyroidism, unspecified: Secondary | ICD-10-CM | POA: Diagnosis not present

## 2024-01-24 DIAGNOSIS — I10 Essential (primary) hypertension: Secondary | ICD-10-CM | POA: Diagnosis not present

## 2024-01-24 DIAGNOSIS — M810 Age-related osteoporosis without current pathological fracture: Secondary | ICD-10-CM

## 2024-01-24 DIAGNOSIS — K219 Gastro-esophageal reflux disease without esophagitis: Secondary | ICD-10-CM | POA: Diagnosis not present

## 2024-01-24 MED ORDER — ESCITALOPRAM OXALATE 20 MG PO TABS: 40.0000 mg | ORAL_TABLET | Freq: Every day | ORAL | 1 refills | Status: DC

## 2024-01-24 MED ORDER — APIXABAN 2.5 MG PO TABS: 2.5000 mg | ORAL_TABLET | Freq: Two times a day (BID) | ORAL | 5 refills | Status: DC

## 2024-01-24 MED ORDER — AMLODIPINE BESYLATE 5 MG PO TABS: 5.0000 mg | ORAL_TABLET | Freq: Two times a day (BID) | ORAL | 1 refills | Status: DC

## 2024-01-24 MED ORDER — OLMESARTAN MEDOXOMIL 40 MG PO TABS: 40.0000 mg | ORAL_TABLET | Freq: Every day | ORAL | 1 refills | Status: DC

## 2024-01-24 MED ORDER — FUROSEMIDE 20 MG PO TABS: 20.0000 mg | ORAL_TABLET | Freq: Every day | ORAL | 1 refills | Status: DC

## 2024-01-24 MED ORDER — ESOMEPRAZOLE MAGNESIUM 40 MG PO CPDR: DELAYED_RELEASE_CAPSULE | ORAL | 1 refills | Status: DC

## 2024-01-24 MED ORDER — ATORVASTATIN CALCIUM 10 MG PO TABS: ORAL_TABLET | ORAL | 1 refills | Status: DC

## 2024-01-24 MED ORDER — AMIODARONE HCL 200 MG PO TABS: 200.0000 mg | ORAL_TABLET | Freq: Every day | ORAL | 1 refills | Status: DC

## 2024-01-24 MED ORDER — LEVOTHYROXINE SODIUM 88 MCG PO TABS
ORAL_TABLET | ORAL | 1 refills | Status: DC
Start: 2024-01-24 — End: 2024-07-24

## 2024-01-24 NOTE — Progress Notes (Signed)
Subjective:    Patient ID: Erica [name], female    DOB: 10-07-1930, 89 y.o.   MRN: [id number]   Chief Complaint: medical management of chronic issues     HPI:  Erica [name] is a 89 y.o. who identifies as a female who was assigned female at birth.   Social history: Lives with: by herself. Her kids check on her daily Work history: retired   Water engineer in today for follow up of the following chronic medical issues:  1. Primary hypertension No c/o chest pain, sob or headache. Does not check blood pressure at home. BP Readings from Last 3 Encounters:  01/03/24 131/66  07/22/23 (!) 151/72  07/13/23 128/62     2. Coronary artery disease involving native coronary artery of native heart without angina pectoris Sees Dr. Sharyn Lull every 6 months. Is doing well. No changes to plan of care since last visit.is on eliquis and pacerone with no issues  3. Gastroparesis Has poor appetite due  to this.  4. Gastroesophageal reflux disease without esophagitis Is on nexium daily an dis doing well.  5. Acquired hypothyroidism No issues that she is aware of Lab Results  Component Value Date   TSH 0.921 07/22/2023    6. Mixed hyperlipidemia Has a very poor appetite. Eats whatever she wants to. Does not do any exercise. Lab Results  Component Value Date   CHOL 189 07/22/2023   HDL 61 07/22/2023   LDLCALC 108 (H) 07/22/2023   TRIG 111 07/22/2023   CHOLHDL 3.1 07/22/2023     7. Anxiety about health Worries about everything    01/24/2024    2:49 PM 07/22/2023    3:34 PM 06/22/2023    3:17 PM 01/24/2023    4:20 PM  GAD 7 : Generalized Anxiety Score  Nervous, Anxious, on Edge 0 1 0 1  Control/stop worrying 0 0 0 0  Worry too much - different things 0 1 0 1  Trouble relaxing 0 0 0 0  Restless 0 0 0 0  Easily annoyed or irritable 0 0 0 0  Afraid - awful might happen 0 0 0 1  Total GAD 7 Score 0 2 0 3  Anxiety Difficulty Not difficult at all Not difficult at all Not difficult at  all Not difficult at all     8. Recurrent major depressive disorder, in partial remission (HCC) Is on lexapro an dis doing well.    01/24/2024    2:49 PM 07/22/2023    3:33 PM 06/22/2023    3:17 PM  Depression screen PHQ 2/9  Decreased Interest 0 0 0  Down, Depressed, Hopeless 0 2 0  PHQ - 2 Score 0 2 0  Altered sleeping 0 0 0  Tired, decreased energy 0 1 0  Change in appetite 0 1 0  Feeling bad or failure about yourself  0 0 0  Trouble concentrating 0 0 0  Moving slowly or fidgety/restless 0 0 0  Suicidal thoughts 0 0 0  PHQ-9 Score 0 4 0  Difficult doing work/chores Not difficult at all Not difficult at all Not difficult at all      9. Osteoporosis without current pathological fracture Last dexascan was done on 04/07/21. Patient no longer wants to repeat. She does no weight bearing exercise. Is on fosamax weekly.  10. BMI 24.0-24.9,adult Wt Readings from Last 3 Encounters:  01/24/24 119 lb (54 kg)  01/03/24 118 lb (53.5 kg)  07/22/23 122 lb (55.3 kg)   BMI  Readings from Last 3 Encounters:  01/24/24 22.48 kg/m  01/03/24 22.30 kg/m  07/22/23 23.05 kg/m        New complaints: Hearing music in ears all the time. Is worse at night.  Allergies  Allergen Reactions   Contrast Media [Iodinated Contrast Media] Shortness Of Breath, Nausea Only and Swelling   Iodine Shortness Of Breath, Nausea Only and Swelling   Iohexol Hives and Swelling     Desc: hives, swelling over 5 yrs ago-pt has never been pre-medicated--was told to not have iv contrast ever    Ioxaglate Nausea Only, Shortness Of Breath and Swelling   Bactrim [Sulfamethoxazole-Trimethoprim] Nausea And Vomiting   Metoclopramide Other (See Comments)    Mouth tremors, insomnia, and irritability   Outpatient Encounter Medications as of 01/24/2024  Medication Sig   alendronate (FOSAMAX) 70 MG tablet TAKE 1 TABLET ONCE A WEEK ON AN EMPTY STOMACH WITH A FULL GLASS OF WATER   amiodarone (PACERONE) 200 MG tablet  Take 1 tablet (200 mg total) by mouth daily.   amLODipine (NORVASC) 5 MG tablet Take 1 tablet (5 mg total) by mouth 2 (two) times daily.   atorvastatin (LIPITOR) 10 MG tablet TAKE ONE TABLET ONCE DAILY AT 6 PM   Cholecalciferol (VITAMIN D3) 125 MCG (5000 UT) CAPS Take 1 capsule by mouth every other day.   diclofenac Sodium (VOLTAREN) 1 % GEL Apply 4 g topically 4 (four) times daily.   ELIQUIS 2.5 MG TABS tablet TAKE ONE TABLET TWICE DAILY   escitalopram (LEXAPRO) 20 MG tablet Take 2 tablets (40 mg total) by mouth daily.   esomeprazole (NEXIUM) 40 MG capsule TAKE ONE CAPSULE TWICE DAILY BEFORE MEALS   fluticasone (FLONASE) 50 MCG/ACT nasal spray Place 2 sprays into both nostrils daily.   furosemide (LASIX) 20 MG tablet Take 1 tablet (20 mg total) by mouth daily.   hydrocortisone (ANUSOL-HC) 2.5 % rectal cream Place 1 application rectally 2 (two) times daily. (Patient taking differently: Place 1 application  rectally 2 (two) times daily as needed for hemorrhoids or anal itching.)   levothyroxine (SYNTHROID) 88 MCG tablet TAKE (1) TABLET DAILY BE- FORE BREAKFAST.   Multiple Vitamin (MULTIVITAMIN WITH MINERALS) TABS tablet Take 1 tablet by mouth daily.   olmesartan (BENICAR) 40 MG tablet Take 1 tablet (40 mg total) by mouth daily.   Pediatric Multiple Vitamins (FLINTSTONES PLUS EXTRA C) CHEW Chew by mouth.   polyethylene glycol (MIRALAX / GLYCOLAX) 17 g packet Take 17 g by mouth daily.   Wheat Dextrin (BENEFIBER) POWD Take 1 Dose by mouth daily. Take daily   No facility-administered encounter medications on file as of 01/24/2024.    Past Surgical History:  Procedure Laterality Date   CARDIAC CATHETERIZATION N/A 02/03/2016   Procedure: Coronary/Graft Angiography;  Surgeon: Rinaldo Cloud, MD;  Location: [facility];  Service: Cardiovascular;  Laterality: N/A;   CATARACT EXTRACTION     CHOLECYSTECTOMY     COLONOSCOPY     Multiple, polyps   ESOPHAGEAL MANOMETRY N/A 06/14/2016   Procedure:  ESOPHAGEAL MANOMETRY (EM);  Surgeon: Iva Boop, MD;  Location: [facility];  Service: Endoscopy;  Laterality: N/A;   LEFT HEART CATHETERIZATION WITH CORONARY ANGIOGRAM N/A 07/17/2013   Procedure: LEFT HEART CATHETERIZATION WITH CORONARY ANGIOGRAM;  Surgeon: Robynn Pane, MD;  Location: [facility]ATH [facility];  Service: Cardiovascular;  Laterality: N/A;   SIGMOIDOSCOPY  08/23/2011   TUBAL LIGATION     UPPER GASTROINTESTINAL ENDOSCOPY  08/23/2011   Normal    Family  History  Problem Relation Age of Onset   Stomach cancer Brother    Colon cancer Paternal Aunt    Lung cancer Brother        smoker   Esophageal cancer Father    Stroke Mother       Controlled substance contract: n/a     Review of Systems  Constitutional:  Negative for diaphoresis.  Eyes:  Negative for pain.  Respiratory:  Negative for shortness of breath.   Cardiovascular:  Negative for chest pain, palpitations and leg swelling.  Gastrointestinal:  Negative for abdominal pain.  Endocrine: Negative for polydipsia.  Skin:  Negative for rash.  Neurological:  Negative for dizziness, weakness and headaches.  Hematological:  Does not bruise/bleed easily.  All other systems reviewed and are negative.      Objective:   Physical Exam Vitals and nursing note reviewed.  Constitutional:      General: She is not in acute distress.    Appearance: Normal appearance. She is well-developed.  HENT:     Head: Normocephalic.     Right Ear: Tympanic membrane normal.     Left Ear: Tympanic membrane normal.     Nose: Nose normal.     Mouth/Throat:     Mouth: Mucous membranes are moist.  Eyes:     Pupils: Pupils are equal, round, and reactive to light.  Neck:     Vascular: No carotid bruit or JVD.  Cardiovascular:     Rate and Rhythm: Normal rate and regular rhythm.     Heart sounds: Murmur (1/6 systolic) heard.  Pulmonary:     Effort: Pulmonary effort is normal. No respiratory distress.     Breath sounds: Normal breath  sounds. No wheezing or rales.  Chest:     Chest wall: No tenderness.  Abdominal:     General: Bowel sounds are normal. There is no distension or abdominal bruit.     Palpations: Abdomen is soft. There is no hepatomegaly, splenomegaly, mass or pulsatile mass.     Tenderness: There is no abdominal tenderness.  Musculoskeletal:        General: Normal range of motion.     Cervical back: Normal range of motion and neck supple.     Right lower leg: No edema.     Left lower leg: No edema.     Comments: Gait slow but steady  Lymphadenopathy:     Cervical: No cervical adenopathy.  Skin:    General: Skin is warm and dry.  Neurological:     Mental Status: She is alert and oriented to person, place, and time.     Deep Tendon Reflexes: Reflexes are normal and symmetric.  Psychiatric:        Mood and Affect: Mood normal.        Behavior: Behavior normal.        Thought Content: Thought content normal.        Judgment: Judgment normal.     BP 132/70   Pulse (!) 57   Temp (!) 97.2 F (36.2 C) (Temporal)   Ht 5\' 1"  (1.549 m)   Wt 119 lb (54 kg)   SpO2 95%   BMI 22.48 kg/m           Assessment & Plan:  Erica [name] comes in today with chief complaint of medical management of chronic issues    Diagnosis and orders addressed:  1. Primary hypertension Low sodium diet - amLODipine (NORVASC) 5 MG tablet; Take 1 tablet (5  mg total) by mouth 2 (two) times daily.  Dispense: 180 tablet; Refill: 1 - furosemide (LASIX) 20 MG tablet; Take 1 tablet (20 mg total) by mouth daily.  Dispense: 90 tablet; Refill: 1 - olmesartan (BENICAR) 40 MG tablet; Take 1 tablet (40 mg total) by mouth daily.  Dispense: 90 tablet; Refill: 1 - CBC with Differential/Platelet - CMP14+EGFR - Lipid panel  2. Coronary artery disease involving native coronary artery of native heart without angina pectoris Keep follow up with cardiology - amiodarone (PACERONE) 200 MG tablet; Take 1 tablet (200 mg total) by mouth  daily.  Dispense: 90 tablet; Refill: 1  3. Gastroparesis Eat 6 small meals a day  4. Gastroesophageal reflux disease without esophagitis Avoid spicy foods Do not eat 2 hours prior to bedtime  - esomeprazole (NEXIUM) 40 MG capsule; TAKE ONE CAPSULE TWICE DAILY BEFORE MEALS  Dispense: 180 capsule; Refill: 1  5. Acquired hypothyroidism Labs pending - levothyroxine (SYNTHROID) 88 MCG tablet; TAKE (1) TABLET DAILY BE- FORE BREAKFAST.  Dispense: 90 tablet; Refill: 1 - Thyroid Panel With TSH  6. Mixed hyperlipidemia Low fat diet - atorvastatin (LIPITOR) 10 MG tablet; TAKE ONE TABLET ONCE DAILY AT 6PM  Dispense: 90 tablet; Refill: 0  7. Anxiety about health Stress management  8. Recurrent major depressive disorder, in partial remission (HCC) - escitalopram (LEXAPRO) 20 MG tablet; Take 2 tablets (40 mg total) by mouth daily.  Dispense: 180 tablet; Refill: 1  9. BMI 23.0-23.9, adult Do not lose anymore weight    10. Age-related osteoporosis without current pathological fracture Weight bearing exercises when can tolerate - alendronate (FOSAMAX) 70 MG tablet; TAKE 1 TABLET ONCE A WEEK ON AN EMPTY STOMACH WITH A FULL GLASS OF WATER  Dispense: 12 tablet; Refill: 3   Labs pending Health Maintenance reviewed Diet and exercise encouraged  Follow up plan: 6 months   Erica Daphine Deutscher, [name]

## 2024-01-24 NOTE — Patient Instructions (Signed)
Fall Prevention in the Home, Adult Falls can cause injuries and can happen to people of all ages. There are many things you can do to make your home safer and to help prevent falls. What actions can I take to prevent falls? General information Use good lighting in all rooms. Make sure to: Replace any light bulbs that burn out. Turn on the lights in dark areas and use night-lights. Keep items that you use often in easy-to-reach places. Lower the shelves around your home if needed. Move furniture so that there are clear paths around it. Do not use throw rugs or other things on the floor that can make you trip. If any of your floors are uneven, fix them. Add color or contrast paint or tape to clearly mark and help you see: Grab bars or handrails. First and last steps of staircases. Where the edge of each step is. If you use a ladder or stepladder: Make sure that it is fully opened. Do not climb a closed ladder. Make sure the sides of the ladder are locked in place. Have someone hold the ladder while you use it. Know where your pets are as you move through your home. What can I do in the bathroom?     Keep the floor dry. Clean up any water on the floor right away. Remove soap buildup in the bathtub or shower. Buildup makes bathtubs and showers slippery. Use non-skid mats or decals on the floor of the bathtub or shower. Attach bath mats securely with double-sided, non-slip rug tape. If you need to sit down in the shower, use a non-slip stool. Install grab bars by the toilet and in the bathtub and shower. Do not use towel bars as grab bars. What can I do in the bedroom? Make sure that you have a light by your bed that is easy to reach. Do not use any sheets or blankets on your bed that hang to the floor. Have a firm chair or bench with side arms that you can use for support when you get dressed. What can I do in the kitchen? Clean up any spills right away. If you need to reach something  above you, use a step stool with a grab bar. Keep electrical cords out of the way. Do not use floor polish or wax that makes floors slippery. What can I do with my stairs? Do not leave anything on the stairs. Make sure that you have a light switch at the top and the bottom of the stairs. Make sure that there are handrails on both sides of the stairs. Fix handrails that are broken or loose. Install non-slip stair treads on all your stairs if they do not have carpet. Avoid having throw rugs at the top or bottom of the stairs. Choose a carpet that does not hide the edge of the steps on the stairs. Make sure that the carpet is firmly attached to the stairs. Fix carpet that is loose or worn. What can I do on the outside of my home? Use bright outdoor lighting. Fix the edges of walkways and driveways and fix any cracks. Clear paths of anything that can make you trip, such as tools or rocks. Add color or contrast paint or tape to clearly mark and help you see anything that might make you trip as you walk through a door, such as a raised step or threshold. Trim any bushes or trees on paths to your home. Check to see if handrails are loose  or broken and that both sides of all steps have handrails. Install guardrails along the edges of any raised decks and porches. Have leaves, snow, or ice cleared regularly. Use sand, salt, or ice melter on paths if you live where there is ice and snow during the winter. Clean up any spills in your garage right away. This includes grease or oil spills. What other actions can I take? Review your medicines with your doctor. Some medicines can cause dizziness or changes in blood pressure, which increase your risk of falling. Wear shoes that: Have a low heel. Do not wear high heels. Have rubber bottoms and are closed at the toe. Feel good on your feet and fit well. Use tools that help you move around if needed. These include: Canes. Walkers. Scooters. Crutches. Ask  your doctor what else you can do to help prevent falls. This may include seeing a physical therapist to learn to do exercises to move better and get stronger. Where to find more information Centers for Disease Control and Prevention, STEADI: TonerPromos.no [employer] on Aging: BaseRingTones.pl National Institute on Aging: BaseRingTones.pl Contact a doctor if: You are afraid of falling at home. You feel weak, drowsy, or dizzy at home. You fall at home. Get help right away if you: Lose consciousness or have trouble moving after a fall. Have a fall that causes a head injury. These symptoms may be an emergency. Get help right away. Call 911. Do not wait to see if the symptoms will go away. Do not drive yourself to the hospital. This information is not intended to replace advice given to you by your health care provider. Make sure you discuss any questions you have with your health care provider. Document Revised: 08/16/2022 Document Reviewed: 08/16/2022 Elsevier Patient Education  2024 [employer].

## 2024-01-25 LAB — THYROID PANEL WITH TSH
Free Thyroxine Index: 4.4 (ref 1.2–4.9)
T3 Uptake Ratio: 36 % (ref 24–39)
T4, Total: 12.1 ug/dL — ABNORMAL HIGH (ref 4.5–12.0)
TSH: 2.84 u[IU]/mL (ref 0.450–4.500)

## 2024-01-25 LAB — CBC WITH DIFFERENTIAL/PLATELET
Basophils Absolute: 0 10*3/uL (ref 0.0–0.2)
Basos: 1 %
EOS (ABSOLUTE): 0.2 10*3/uL (ref 0.0–0.4)
Eos: 2 %
Hematocrit: 38.3 % (ref 34.0–46.6)
Hemoglobin: 12.4 g/dL (ref 11.1–15.9)
Immature Grans (Abs): 0 10*3/uL (ref 0.0–0.1)
Immature Granulocytes: 0 %
Lymphocytes Absolute: 2.6 10*3/uL (ref 0.7–3.1)
Lymphs: 36 %
MCH: 30.5 pg (ref 26.6–33.0)
MCHC: 32.4 g/dL (ref 31.5–35.7)
MCV: 94 fL (ref 79–97)
Monocytes Absolute: 0.6 10*3/uL (ref 0.1–0.9)
Monocytes: 9 %
Neutrophils Absolute: 3.8 10*3/uL (ref 1.4–7.0)
Neutrophils: 52 %
Platelets: 249 10*3/uL (ref 150–450)
RBC: 4.06 x10E6/uL (ref 3.77–5.28)
RDW: 13.4 % (ref 11.7–15.4)
WBC: 7.3 10*3/uL (ref 3.4–10.8)

## 2024-01-25 LAB — CMP14+EGFR
ALT: 43 [IU]/L — ABNORMAL HIGH (ref 0–32)
AST: 43 [IU]/L — ABNORMAL HIGH (ref 0–40)
Albumin: 3.6 g/dL (ref 3.6–4.6)
Alkaline Phosphatase: 64 [IU]/L (ref 44–121)
BUN/Creatinine Ratio: 18 (ref 12–28)
BUN: 18 mg/dL (ref 10–36)
Bilirubin Total: 0.4 mg/dL (ref 0.0–1.2)
CO2: 25 mmol/L (ref 20–29)
Calcium: 9.1 mg/dL (ref 8.7–10.3)
Chloride: 100 mmol/L (ref 96–106)
Creatinine, Ser: 0.99 mg/dL (ref 0.57–1.00)
Globulin, Total: 2.3 g/dL (ref 1.5–4.5)
Glucose: 98 mg/dL (ref 70–99)
Potassium: 4.6 mmol/L (ref 3.5–5.2)
Sodium: 137 mmol/L (ref 134–144)
Total Protein: 5.9 g/dL — ABNORMAL LOW (ref 6.0–8.5)
eGFR: 54 mL/min/{1.73_m2} — ABNORMAL LOW (ref >59)

## 2024-01-25 LAB — LIPID PANEL
Chol/HDL Ratio: 3.4 {ratio} (ref 0.0–4.4)
Cholesterol, Total: 202 mg/dL — ABNORMAL HIGH (ref 100–199)
HDL: 60 mg/dL (ref >39)
LDL Chol Calc (NIH): 121 mg/dL — ABNORMAL HIGH (ref 0–99)
Triglycerides: 119 mg/dL (ref 0–149)
VLDL Cholesterol Cal: 21 mg/dL (ref 5–40)

## 2024-01-26 MED ORDER — ATORVASTATIN CALCIUM 40 MG PO TABS: 40.0000 mg | ORAL_TABLET | Freq: Every day | ORAL | 1 refills | Status: DC

## 2024-01-26 NOTE — Addendum Note (Signed)
Addended by: Bennie Pierini on: 01/26/2024 01:15 PM   Modules accepted: Orders

## 2024-02-02 NOTE — Telephone Encounter (Signed)
 Additional testing for what/ we con only order test based on symptoms she is having

## 2024-02-02 NOTE — Telephone Encounter (Signed)
 Patietn not having any additional symptoms. Pt informed no additional testing will be done. LS

## 2024-02-06 ENCOUNTER — Ambulatory Visit (HOSPITAL_COMMUNITY)
Admission: RE | Admit: 2024-02-06 | Discharge: 2024-02-06 | Disposition: A | Discharge status: Home / Self Care | Payer: Medicare Other | Source: Ambulatory Visit | Attending: Nurse Practitioner | Admitting: Nurse Practitioner

## 2024-02-06 ENCOUNTER — Other Ambulatory Visit: Payer: Self-pay | Admitting: Nurse Practitioner

## 2024-02-06 DIAGNOSIS — Z6827 Body mass index (BMI) 27.0-27.9, adult: Secondary | ICD-10-CM

## 2024-02-06 DIAGNOSIS — H9313 Tinnitus, bilateral: Secondary | ICD-10-CM | POA: Insufficient documentation

## 2024-02-06 DIAGNOSIS — K3184 Gastroparesis: Secondary | ICD-10-CM

## 2024-02-06 DIAGNOSIS — F3341 Major depressive disorder, recurrent, in partial remission: Secondary | ICD-10-CM

## 2024-02-06 DIAGNOSIS — H9312 Tinnitus, left ear: Secondary | ICD-10-CM | POA: Diagnosis not present

## 2024-02-06 DIAGNOSIS — M810 Age-related osteoporosis without current pathological fracture: Secondary | ICD-10-CM

## 2024-02-06 DIAGNOSIS — I10 Essential (primary) hypertension: Secondary | ICD-10-CM

## 2024-02-06 DIAGNOSIS — K219 Gastro-esophageal reflux disease without esophagitis: Secondary | ICD-10-CM

## 2024-02-06 DIAGNOSIS — R519 Headache, unspecified: Secondary | ICD-10-CM | POA: Diagnosis not present

## 2024-02-06 DIAGNOSIS — Z23 Encounter for immunization: Secondary | ICD-10-CM

## 2024-02-06 DIAGNOSIS — I2510 Atherosclerotic heart disease of native coronary artery without angina pectoris: Secondary | ICD-10-CM

## 2024-02-06 DIAGNOSIS — E782 Mixed hyperlipidemia: Secondary | ICD-10-CM

## 2024-02-06 DIAGNOSIS — E039 Hypothyroidism, unspecified: Secondary | ICD-10-CM

## 2024-02-14 ENCOUNTER — Telehealth: Payer: Self-pay | Admitting: Family Medicine

## 2024-02-14 NOTE — Telephone Encounter (Signed)
Radiology final reports not received. Pt notified. LS

## 2024-02-14 NOTE — Telephone Encounter (Unsigned)
Copied from CRM [id number]. Topic: General - Other >> Feb 14, 2024  3:18 PM Geroge Baseman wrote: Reason for CRM: Patient calling in to check results of MRI. Has a couple of questions she would like to ask when someone is available to call her with results.

## 2024-02-22 ENCOUNTER — Telehealth: Payer: Self-pay

## 2024-02-22 NOTE — Telephone Encounter (Signed)
 Copied from CRM [id number]. Topic: Clinical - Lab/Test Results >> Feb 22, 2024 12:46 PM Herbert Seta B wrote: Reason for CRM: Patient calling to get results of MRI-brain-Feb [phone]

## 2024-02-23 NOTE — Telephone Encounter (Signed)
 Patient notified and verbalized understanding. She states that she is still hearing noises and wants to know if she should see ENT? Please advise

## 2024-02-27 ENCOUNTER — Telehealth: Payer: Self-pay | Admitting: Family Medicine

## 2024-02-27 ENCOUNTER — Ambulatory Visit: Payer: Medicare Other

## 2024-02-27 ENCOUNTER — Other Ambulatory Visit: Payer: Self-pay

## 2024-02-27 VITALS — Ht 61.0 in | Wt 119.0 lb

## 2024-02-27 DIAGNOSIS — I2510 Atherosclerotic heart disease of native coronary artery without angina pectoris: Secondary | ICD-10-CM | POA: Diagnosis not present

## 2024-02-27 DIAGNOSIS — E782 Mixed hyperlipidemia: Secondary | ICD-10-CM | POA: Diagnosis not present

## 2024-02-27 DIAGNOSIS — I10 Essential (primary) hypertension: Secondary | ICD-10-CM | POA: Diagnosis not present

## 2024-02-27 DIAGNOSIS — E039 Hypothyroidism, unspecified: Secondary | ICD-10-CM | POA: Diagnosis not present

## 2024-02-27 DIAGNOSIS — H9313 Tinnitus, bilateral: Secondary | ICD-10-CM

## 2024-02-27 DIAGNOSIS — Z0000 Encounter for general adult medical examination without abnormal findings: Secondary | ICD-10-CM

## 2024-02-27 NOTE — Telephone Encounter (Signed)
 Labs faxed to Dr per patients request

## 2024-02-27 NOTE — Progress Notes (Signed)
 Subjective:   Erica [name] is a 89 y.o. who presents for a Medicare Wellness preventive visit.  Visit Complete: Virtual I connected with  Erica [name] on 02/27/24 by a audio enabled telemedicine application and verified that I am speaking with the correct person using two identifiers.  Patient Location: Home  Provider Location: Home Office  I discussed the limitations of evaluation and management by telemedicine. The patient expressed understanding and agreed to proceed.  Vital Signs: Because this visit was a virtual/telehealth visit, some criteria may be missing or patient reported. Any vitals not documented were not able to be obtained and vitals that have been documented are patient reported.  VideoDeclined- This patient declined Librarian, academic. Therefore the visit was completed with audio only.  AWV Questionnaire: No: Patient Medicare AWV questionnaire was not completed prior to this visit.  Cardiac Risk Factors include: advanced age (>18men, >29 women);hypertension;dyslipidemia     Objective:    Today's Vitals   02/27/24 1429  Weight: 119 lb (54 kg)  Height: 5\' 1"  (1.549 m)   Body mass index is 22.48 kg/m.     02/27/2024    2:32 PM 02/24/2023    1:24 PM 09/23/2022    2:52 PM 11/23/2021    2:04 PM 05/17/2020    4:09 PM 04/23/2019   12:59 PM 04/03/2018    2:19 PM  Advanced Directives  Does Patient Have a Medical Advance Directive? No No Yes Yes Yes Yes Yes  Type of Surveyor, minerals;Living will Healthcare Power of [place];Living will Healthcare Power of [place];Living will Living will   Does patient want to make changes to medical advance directive?   No - Patient declined      Copy of Healthcare Power of Attorney in Chart?   No - copy requested No - copy requested     Would patient like information on creating a medical advance directive? Yes (MAU/Ambulatory/Procedural Areas - Information given) No -  Patient declined  No - Patient declined  No - Patient declined     Current Medications (verified) Outpatient Encounter Medications as of 02/27/2024  Medication Sig   alendronate (FOSAMAX) 70 MG tablet TAKE 1 TABLET ONCE A WEEK ON AN EMPTY STOMACH WITH A FULL GLASS OF WATER   amiodarone (PACERONE) 200 MG tablet Take 1 tablet (200 mg total) by mouth daily.   amLODipine (NORVASC) 5 MG tablet Take 1 tablet (5 mg total) by mouth 2 (two) times daily.   apixaban (ELIQUIS) 2.5 MG TABS tablet Take 1 tablet (2.5 mg total) by mouth 2 (two) times daily.   atorvastatin (LIPITOR) 40 MG tablet Take 1 tablet (40 mg total) by mouth daily.   Cholecalciferol (VITAMIN D3) 125 MCG (5000 UT) CAPS Take 1 capsule by mouth every other day.   diclofenac Sodium (VOLTAREN) 1 % GEL Apply 4 g topically 4 (four) times daily.   escitalopram (LEXAPRO) 20 MG tablet Take 2 tablets (40 mg total) by mouth daily.   esomeprazole (NEXIUM) 40 MG capsule TAKE ONE CAPSULE TWICE DAILY BEFORE MEALS   fluticasone (FLONASE) 50 MCG/ACT nasal spray Place 2 sprays into both nostrils daily.   furosemide (LASIX) 20 MG tablet Take 1 tablet (20 mg total) by mouth daily.   hydrocortisone (ANUSOL-HC) 2.5 % rectal cream Place 1 application rectally 2 (two) times daily. (Patient taking differently: Place 1 application  rectally 2 (two) times daily as needed for hemorrhoids or anal itching.)   levothyroxine (SYNTHROID)  88 MCG tablet TAKE (1) TABLET DAILY BE- FORE BREAKFAST.   Multiple Vitamin (MULTIVITAMIN WITH MINERALS) TABS tablet Take 1 tablet by mouth daily.   olmesartan (BENICAR) 40 MG tablet Take 1 tablet (40 mg total) by mouth daily.   Pediatric Multiple Vitamins (FLINTSTONES PLUS EXTRA C) CHEW Chew by mouth.   polyethylene glycol (MIRALAX / GLYCOLAX) 17 g packet Take 17 g by mouth daily.   Wheat Dextrin (BENEFIBER) POWD Take 1 Dose by mouth daily. Take daily   No facility-administered encounter medications on file as of 02/27/2024.     Allergies (verified) Contrast media [iodinated contrast media], Iodine, Iohexol, Ioxaglate, Bactrim [sulfamethoxazole-trimethoprim], and Metoclopramide   History: Past Medical History:  Diagnosis Date   Abnormal transaminases 05/09/2019   Abnormality of gait 08/25/2016   Adenomatous rectal polyp    Anxiety about health 05/09/2019   Arthritis 04/03/2018   L hip   Atrial fibrillation (HCC)    Bleeding internal hemorrhoids    Cataract    Chronic back pain    Depression    Diverticulosis of colon (without mention of hemorrhage)    Gastroparesis    GERD (gastroesophageal reflux disease)    Hiatal hernia 09/1999   3cm   Hyperlipidemia    Hypertension    Hypothyroid    Ischemic colitis (HCC)    Left foot drop 08/25/2016   Lichen sclerosus et atrophicus of the vulva    MVP (mitral valve prolapse)    Osteoporosis    Sleep apnea    TIA (transient ischemic attack)    Weakness    Past Surgical History:  Procedure Laterality Date   CARDIAC CATHETERIZATION N/A 02/03/2016   Procedure: Coronary/Graft Angiography;  Surgeon: Rinaldo Cloud, MD;  Location: [facility];  Service: Cardiovascular;  Laterality: N/A;   CATARACT EXTRACTION     CHOLECYSTECTOMY     COLONOSCOPY     Multiple, polyps   ESOPHAGEAL MANOMETRY N/A 06/14/2016   Procedure: ESOPHAGEAL MANOMETRY (EM);  Surgeon: Iva Boop, MD;  Location: [facility];  Service: Endoscopy;  Laterality: N/A;   LEFT HEART CATHETERIZATION WITH CORONARY ANGIOGRAM N/A 07/17/2013   Procedure: LEFT HEART CATHETERIZATION WITH CORONARY ANGIOGRAM;  Surgeon: Robynn Pane, MD;  Location: [facility]ATH [facility];  Service: Cardiovascular;  Laterality: N/A;   SIGMOIDOSCOPY  08/23/2011   TUBAL LIGATION     UPPER GASTROINTESTINAL ENDOSCOPY  08/23/2011   Normal   Family History  Problem Relation Age of Onset   Stomach cancer Brother    Colon cancer Paternal Aunt    Lung cancer Brother        smoker   Esophageal cancer Father    Stroke Mother     Social History   Socioeconomic History   Marital status: Widowed    Spouse name: Not on file   Number of children: 9   Years of education: 12   Highest education level: 12th grade  Occupational History   Occupation: retired    Associate Professor: RETIRED  Tobacco Use   Smoking status: Never    Passive exposure: Never   Smokeless tobacco: Never  Vaping Use   Vaping status: Never Used  Substance and Sexual Activity   Alcohol use: No   Drug use: No   Sexual activity: Not Currently    Partners: Male  Other Topics Concern   Not on file  Social History Narrative   Lives at home alone.   Right-handed.   2 cups caffeine most days.   17 grandkids  21 great grand kids   Social Drivers of Corporate investment banker Strain: Low Risk  (02/27/2024)   Overall Financial Resource Strain (CARDIA)    Difficulty of Paying Living Expenses: Not hard at all  Food Insecurity: No Food Insecurity (02/27/2024)   Hunger Vital Sign    Worried About Running Out of Food in the Last Year: Never true    Ran Out of Food in the Last Year: Never true  Transportation Needs: No Transportation Needs (02/27/2024)   PRAPARE - Administrator, Civil Service (Medical): No    Lack of Transportation (Non-Medical): No  Physical Activity: Insufficiently Active (02/27/2024)   Exercise Vital Sign    Days of Exercise per Week: 3 days    Minutes of Exercise per Session: 30 min  Stress: No Stress Concern Present (02/27/2024)   [employer] of Occupational Health - Occupational Stress Questionnaire    Feeling of Stress : Not at all  Social Connections: Moderately Isolated (02/27/2024)   Social Connection and Isolation Panel [NHANES]    Frequency of Communication with Friends and Family: More than three times a week    Frequency of Social Gatherings with Friends and Family: Three times a week    Attends Religious Services: 1 to 4 times per year    Active Member of Clubs or Organizations: No    Attends Tax inspector Meetings: Never    Marital Status: Widowed    Tobacco Counseling Counseling given: Not Answered    Clinical Intake:  Pre-visit preparation completed: Yes  Pain : No/denies pain     Diabetes: No  How often do you need to have someone help you when you read instructions, pamphlets, or other written materials from your doctor or pharmacy?: 1 - Never  Interpreter Needed?: No  Information entered by :: Kandis Fantasia LPN   Activities of Daily Living     02/27/2024    2:32 PM  In your present state of health, do you have any difficulty performing the following activities:  Hearing? 0  Vision? 0  Difficulty concentrating or making decisions? 0  Walking or climbing stairs? 0  Dressing or bathing? 0  Doing errands, shopping? 0  Preparing Food and eating ? N  Using the Toilet? N  In the past six months, have you accidently leaked urine? N  Do you have problems with loss of bowel control? N  Managing your Medications? N  Managing your Finances? N  Housekeeping or managing your Housekeeping? N    Patient Care Team: Bennie Pierini, [name] as PCP - General (Family Medicine) Shirlean Kelly, MD as Consulting Physician (Neurosurgery) De Blanch as Physician Assistant (Gastroenterology)  Indicate any recent Medical Services you may have received from other than [facility] providers in the past year (date may be approximate).     Assessment:   This is a routine wellness examination for Jonna.  Hearing/Vision screen Hearing Screening - Comments:: Denies hearing difficulties   Vision Screening - Comments:: Wears rx glasses - up to date with routine eye exams with [facility] [place]      Goals Addressed   None    Depression Screen     02/27/2024    2:31 PM 01/24/2024    2:49 PM 07/22/2023    3:33 PM 06/22/2023    3:17 PM 01/24/2023    4:19 PM 11/01/2022    3:24 PM 09/23/2022    2:49 PM  PHQ 2/9 Scores  PHQ - 2 Score 0  0 2 0 0 0 0  PHQ- 9 Score 0  0 4 0 2 0     Fall Risk     02/27/2024    2:32 PM 01/24/2024    2:49 PM 07/22/2023    3:33 PM 06/22/2023    3:17 PM 02/24/2023    1:20 PM  Fall Risk   Falls in the past year? 0 0 0 0 0  Number falls in past yr: 0    0  Injury with Fall? 0    0  Risk for fall due to : No Fall Risks    No Fall Risks  Follow up Falls prevention discussed;Education provided;Falls evaluation completed    Falls prevention discussed    MEDICARE RISK AT HOME:  Medicare Risk at Home Any stairs in or around the home?: No If so, are there any without handrails?: No Home free of loose throw rugs in walkways, pet beds, electrical cords, etc?: Yes Adequate lighting in your home to reduce risk of falls?: Yes Life alert?: No Use of a cane, walker or w/c?: No Grab bars in the bathroom?: Yes Shower chair or bench in shower?: No Elevated toilet seat or a handicapped toilet?: Yes  TIMED UP AND GO:  Was the test performed?  No  Cognitive Function: 6CIT completed        02/27/2024    2:32 PM 02/24/2023    1:24 PM 11/23/2021    2:09 PM  6CIT Screen  What Year? 0 points 0 points 0 points  What month? 0 points 0 points 0 points  What time? 0 points 0 points 0 points  Count back from 20 0 points 0 points 0 points  Months in reverse 2 points 2 points 0 points  Repeat phrase 2 points 2 points 0 points  Total Score 4 points 4 points 0 points    Immunizations Immunization History  Administered Date(s) Administered   Fluad Quad(high Dose 65+) 10/04/2019, 10/22/2020, 11/30/2021   Fluad Trivalent(High Dose 65+) 01/24/2024   Influenza, High Dose Seasonal PF 10/03/2013, 09/20/2014, 09/27/2016, 10/12/2017, 10/11/2018   Influenza,inj,Quad PF,6+ Mos 10/08/2015   Influenza-Unspecified 10/17/1997, 10/11/2001, 10/08/2015   Moderna Covid-19 Fall Seasonal Vaccine 35yrs & older 12/29/2022   Moderna Sars-Covid-2 Vaccination 03/10/2020, 11/18/2020   Pneumococcal Conjugate-13 09/27/2016   Pneumococcal Polysaccharide-23  10/17/1997, 11/16/2013   Tdap 10/05/2011, 08/21/2017   Zoster, Live 10/03/2013    Screening Tests Health Maintenance  Topic Date Due   COVID-19 Vaccine (4 - 2024-25 season) 08/28/2023   Zoster Vaccines- Shingrix (1 of 2) 04/23/2024 (Originally 09/12/1982)   Medicare Annual Wellness (AWV)  02/26/2025   DTaP/Tdap/Td (3 - Td or Tdap) 08/22/2027   Pneumonia Vaccine 68+ Years old  Completed   INFLUENZA VACCINE  Completed   DEXA SCAN  Completed   HPV VACCINES  Aged Out   Colonoscopy  Discontinued    Health Maintenance  Health Maintenance Due  Topic Date Due   COVID-19 Vaccine (4 - 2024-25 season) 08/28/2023    Additional Screening:  Vision Screening: Recommended annual ophthalmology exams for early detection of glaucoma and other disorders of the eye.  Dental Screening: Recommended annual dental exams for proper oral hygiene  Community Resource Referral / Chronic Care Management: CRR required this visit?  No   CCM required this visit?  No     Plan:     I have personally reviewed and noted the following in the patient's chart:   Medical and social history Use of alcohol, tobacco or  illicit drugs  Current medications and supplements including opioid prescriptions. Patient is not currently taking opioid prescriptions. Functional ability and status Nutritional status Physical activity Advanced directives List of other physicians Hospitalizations, surgeries, and ER visits in previous 12 months Vitals Screenings to include cognitive, depression, and falls Referrals and appointments  In addition, I have reviewed and discussed with patient certain preventive protocols, quality metrics, and best practice recommendations. A written personalized care plan for preventive services as well as general preventive health recommendations were provided to patient.     Kandis Fantasia [place], [place]   01/01/1095   After Visit Summary: (Declined) Due to this being a telephonic visit,  with patients personalized plan was offered to patient but patient Declined AVS at this time   Notes: Nothing significant to report at this time.

## 2024-02-27 NOTE — Telephone Encounter (Signed)
 She can if she wants to. I do not think that will help much- maybe needs to have hearing checked

## 2024-02-27 NOTE — Telephone Encounter (Signed)
 Copied from CRM [id number]. Topic: Medical Record Request - Records Request >> Feb 27, 2024  2:49 PM Tiffany S wrote: Reason for CRM: Patient asked if we could fax most recent labs to Dr Sherie Don office fax number [phone]

## 2024-02-27 NOTE — Patient Instructions (Signed)
 Erica [name] , Thank you for taking time to come for your Medicare Wellness Visit. I appreciate your ongoing commitment to your health goals. Please review the following plan we discussed and let me know if I can assist you in the future.   Referrals/Orders/Follow-Ups/Clinician Recommendations: Aim for 30 minutes of exercise or brisk walking, 6-8 glasses of water, and 5 servings of fruits and vegetables each day.  This is a list of the screening recommended for you and due dates:  Health Maintenance  Topic Date Due   COVID-19 Vaccine (4 - 2024-25 season) 08/28/2023   Zoster (Shingles) Vaccine (1 of 2) 04/23/2024*   Medicare Annual Wellness Visit  02/26/2025   DTaP/Tdap/Td vaccine (3 - Td or Tdap) 08/22/2027   Pneumonia Vaccine  Completed   Flu Shot  Completed   DEXA scan (bone density measurement)  Completed   HPV Vaccine  Aged Out   Colon Cancer Screening  Discontinued  *Topic was postponed. The date shown is not the original due date.    Advanced directives: (ACP Link)Information on Advanced Care Planning can be found at [facility] [place] Advance Health Care Directives Advance Health Care Directives ([email or web address]   Next Medicare Annual Wellness Visit scheduled for next year: Yes

## 2024-02-27 NOTE — Telephone Encounter (Signed)
 Contacted patient and advised that we would place a referral to ENT as requested. Patient verbalized understanding

## 2024-05-04 ENCOUNTER — Ambulatory Visit (INDEPENDENT_AMBULATORY_CARE_PROVIDER_SITE_OTHER): Admitting: Audiology

## 2024-05-04 ENCOUNTER — Encounter (INDEPENDENT_AMBULATORY_CARE_PROVIDER_SITE_OTHER): Payer: Self-pay | Admitting: Otolaryngology

## 2024-05-04 ENCOUNTER — Ambulatory Visit (INDEPENDENT_AMBULATORY_CARE_PROVIDER_SITE_OTHER): Admitting: Otolaryngology

## 2024-05-04 VITALS — BP 170/68 | HR 58 | Ht 61.0 in | Wt 120.0 lb

## 2024-05-04 DIAGNOSIS — H9313 Tinnitus, bilateral: Secondary | ICD-10-CM | POA: Diagnosis not present

## 2024-05-04 DIAGNOSIS — H903 Sensorineural hearing loss, bilateral: Secondary | ICD-10-CM

## 2024-05-04 NOTE — Progress Notes (Unsigned)
 Music ears

## 2024-05-04 NOTE — Progress Notes (Unsigned)
  [address], [address] [place], [place] [address] [phone]  Audiological Evaluation    Name: Erica [name]     DOB:   13-Jul-1930      MRN:   [id number]                                                                                     Service Date: 05/04/2024     Accompanied by: son   Patient comes today after Dr. Lydia Sams, ENT sent a referral for a hearing evaluation due to concerns with tinnitus.   Symptoms Yes Details  Hearing loss  [x]  Known hearing loss in both ears. No previous tests available on file.   Tinnitus  [x]  Reports music like sound in her left ear. She says it is bothersome.  Ear pain/ infections/pressure  []    Balance problems  []    Noise exposure history  []    Previous ear surgeries  []    Family history of hearing loss  []    Amplification  [x]  Has a set of hearing aids (second set) that was fit in [place], [place]  Other  []      Otoscopy: Right ear: Significant amount of cerumen, unable to visualize tympanic membrane landmarks. Left ear:  Clear external ear canals and notable landmarks visualized on the tympanic membrane.  Tympanometry: Right ear: Type A- Normal external ear canal volume with normal middle ear pressure and tympanic membrane compliance. Left ear: Type A- Normal external ear canal volume with normal middle ear pressure and tympanic membrane compliance.    Pure tone Audiometry: Right ear- Mild to severe sensorineural hearing loss from 125 Hz - 8000 Hz. Left ear-  Mild to severe sensorineural hearing loss from 125 Hz - 8000 Hz.  Speech Audiometry: Right ear- Speech Reception Threshold (SRT) was obtained at 60 dBHL. Left ear-Speech Reception Threshold (SRT) was obtained at 60 dBHL.   Word Recognition Score Tested using NU-6 (MLV) Right ear: 12% was obtained at a presentation level of 60 dBHL with contralateral masking which is deemed as  poor. Left ear: 20% was obtained at a presentation level of 60 dBHL with contralateral masking which is  deemed as  poor.   The hearing test results were completed under headphones and results are deemed to be of good reliability. Test technique:  conventional     Recommendations: Follow up with ENT as scheduled for today. Return for a hearing evaluation if concerns with hearing changes arise or per MD recommendation. Consider various tinnitus strategies, including the use of a sound generator, hearing aids, and/or tinnitus retraining therapy. Patient may consider going to [facility] tinnitus clinic.   Melisse Caetano MARIE LEROUX-MARTINEZ, AUD

## 2024-05-14 ENCOUNTER — Encounter: Payer: Self-pay | Admitting: Audiology

## 2024-05-28 DIAGNOSIS — E039 Hypothyroidism, unspecified: Secondary | ICD-10-CM | POA: Diagnosis not present

## 2024-05-28 DIAGNOSIS — I2510 Atherosclerotic heart disease of native coronary artery without angina pectoris: Secondary | ICD-10-CM | POA: Diagnosis not present

## 2024-05-28 DIAGNOSIS — I10 Essential (primary) hypertension: Secondary | ICD-10-CM | POA: Diagnosis not present

## 2024-05-28 DIAGNOSIS — E782 Mixed hyperlipidemia: Secondary | ICD-10-CM | POA: Diagnosis not present

## 2024-06-18 DIAGNOSIS — W19XXXA Unspecified fall, initial encounter: Secondary | ICD-10-CM | POA: Diagnosis not present

## 2024-06-18 DIAGNOSIS — M5459 Other low back pain: Secondary | ICD-10-CM | POA: Diagnosis not present

## 2024-06-18 DIAGNOSIS — M533 Sacrococcygeal disorders, not elsewhere classified: Secondary | ICD-10-CM | POA: Diagnosis not present

## 2024-06-18 DIAGNOSIS — M4156 Other secondary scoliosis, lumbar region: Secondary | ICD-10-CM | POA: Diagnosis not present

## 2024-07-05 ENCOUNTER — Ambulatory Visit: Admitting: Gastroenterology

## 2024-07-19 ENCOUNTER — Encounter: Payer: Self-pay | Admitting: Gastroenterology

## 2024-07-19 ENCOUNTER — Telehealth: Payer: Self-pay | Admitting: Gastroenterology

## 2024-07-19 ENCOUNTER — Ambulatory Visit (HOSPITAL_COMMUNITY)
Admission: RE | Admit: 2024-07-19 | Discharge: 2024-07-19 | Disposition: A | Discharge status: Home / Self Care | Source: Ambulatory Visit | Attending: Gastroenterology | Admitting: Gastroenterology

## 2024-07-19 ENCOUNTER — Ambulatory Visit: Payer: Self-pay | Admitting: Gastroenterology

## 2024-07-19 ENCOUNTER — Other Ambulatory Visit (HOSPITAL_COMMUNITY)
Admission: RE | Admit: 2024-07-19 | Discharge: 2024-07-19 | Disposition: A | Discharge status: Home / Self Care | Source: Ambulatory Visit | Attending: Gastroenterology | Admitting: Gastroenterology

## 2024-07-19 ENCOUNTER — Ambulatory Visit (INDEPENDENT_AMBULATORY_CARE_PROVIDER_SITE_OTHER): Admitting: Gastroenterology

## 2024-07-19 VITALS — BP 154/56 | HR 53 | Temp 97.9°F | Ht 61.0 in | Wt 111.2 lb

## 2024-07-19 DIAGNOSIS — R140 Abdominal distension (gaseous): Secondary | ICD-10-CM | POA: Diagnosis not present

## 2024-07-19 DIAGNOSIS — K3184 Gastroparesis: Secondary | ICD-10-CM

## 2024-07-19 DIAGNOSIS — K219 Gastro-esophageal reflux disease without esophagitis: Secondary | ICD-10-CM

## 2024-07-19 DIAGNOSIS — R634 Abnormal weight loss: Secondary | ICD-10-CM | POA: Diagnosis not present

## 2024-07-19 DIAGNOSIS — K921 Melena: Secondary | ICD-10-CM

## 2024-07-19 DIAGNOSIS — R630 Anorexia: Secondary | ICD-10-CM | POA: Insufficient documentation

## 2024-07-19 DIAGNOSIS — K5900 Constipation, unspecified: Secondary | ICD-10-CM | POA: Diagnosis not present

## 2024-07-19 DIAGNOSIS — R1013 Epigastric pain: Secondary | ICD-10-CM

## 2024-07-19 DIAGNOSIS — I700 Atherosclerosis of aorta: Secondary | ICD-10-CM | POA: Diagnosis not present

## 2024-07-19 LAB — CBC
HCT: 36.5 % (ref 36.0–46.0)
Hemoglobin: 12.1 g/dL (ref 12.0–15.0)
MCH: 32.4 pg (ref 26.0–34.0)
MCHC: 33.2 g/dL (ref 30.0–36.0)
MCV: 97.6 fL (ref 80.0–100.0)
Platelets: 154 K/uL (ref 150–400)
RBC: 3.74 MIL/uL — ABNORMAL LOW (ref 3.87–5.11)
RDW: 13.9 % (ref 11.5–15.5)
WBC: 6 K/uL (ref 4.0–10.5)
nRBC: 0 % (ref 0.0–0.2)

## 2024-07-19 LAB — IRON AND TIBC
Iron: 111 ug/dL (ref 28–170)
Saturation Ratios: 35 % — ABNORMAL HIGH (ref 10.4–31.8)
TIBC: 319 ug/dL (ref 250–450)
UIBC: 208 ug/dL

## 2024-07-19 LAB — FERRITIN: Ferritin: 86 ng/mL (ref 11–307)

## 2024-07-19 NOTE — Progress Notes (Unsigned)
 GI Office Note    Referring Provider: Gladis [name], * Primary Care Physician:  Erica Mustard, [name] Primary Gastroenterologist: Erica HERO.Rourk, MD  Date:  07/19/2024  ID:  Erica [name], DOB [birth year], MRN [id number]  Chief Complaint   Chief Complaint  Patient presents with   Bloated    Having issues with nausea, abd pain and states that her stools have been black at times.   History of Present Illness  Erica [name] is a 89 y.o. female with a history of esophageal dysmotility, gastroparesis, anorexia, internal hemorrhoids, weight loss felt to be secondary to depression, ischemic colitis, HLD, HTN, hypothyroidism, diverticulosis, and A-fib on Eliquis  presenting today with complaint of bloating, nausea, abdominal pain as well as black stool.  EGD 2019: -tortuous esophagus -mild schatzki ring s/p dilation -single gastric polyp, hyperplastic and no h.pylori -small hiatal hernia   Colonoscopy 06/2014: -three sessile polyps measuring 5-31mm in ascending colon, tubular adenoma -severe diverticulosis in sigmoid colon   CT A/P without contrast 05/2023 IMPRESSION: 1. Sigmoid diverticulosis without diverticulitis. 2. Small hiatal hernia. 3. Increased attenuation of the liver parenchyma consistent with history of amiodarone  therapy.  Last office visit July 2024 with Erica Kerns, PA-C.  Had been seen recently by PCP for diarrhea.  Stool culture and C. difficile sample negative.  Stool was Hemoccult positive.  Reported red blood with wiping at that time and having intermittent hemorrhoid flares.  Denied any abdominal pain, heartburn, or dysphagia.  Notes of occasional regurgitation which she blamed on her gastroparesis.  Denied any unintentional weight loss.  Chronically on Eliquis  and was dealing with recurrent UTIs.  CBC was advised to assess for anemia and repeat iFOBT.  Advised to monitor for GI bleeding.  Stool cards x 3 in August 2024 were negative.     Latest Ref  Rng & Units 01/24/2024    3:22 PM 07/22/2023    4:09 PM 01/24/2023    4:54 PM  CBC  WBC 3.4 - 10.8 x10E3/uL 7.3  8.4  8.4   Hemoglobin 11.1 - 15.9 g/dL 87.5  87.5  87.5   Hematocrit 34.0 - 46.6 % 38.3  37.3  37.1   Platelets 150 - 450 x10E3/uL 249  217  213      Today:  Daughter Erica [name] accompanies her today.   She feels like gastroparesis is getting worse - having upper abdominal pain and no appetite. Daughter states she has lost about 10 lbs. Has some left side pain as well. These symptoms have been going on for 2-3 months but worse over the last few weeks. Doe not have anything at home. Has not had any vomiting. Does have some regurgitation of bile. Stuff is mostly coming up at night.  Breakfast is eggs and eats crackers and ensure at lunch. At dinner her family brings in food or eats pintos etc.   This week she seen some black stool that looked like it contained mucus. Feels bloated and distended in her upper abdomen. Has used pepto recently as well. Has not seen brbpr with BMs. Potentially seen it prior to taking the pepto.   Sometimes feels constipated and this week she is still constipated but at times she is not. Will take dulcolax as needed but not frequently.   Not consistently taking Nexium  every day.  Has not tolerated Reglan  in the past.    Wt Readings from Last 5 Encounters:  07/19/24 111 lb 3.2 oz (50.4 kg)  05/04/24 120 lb (54.4 kg)  02/27/24 119  lb (54 kg)  01/24/24 119 lb (54 kg)  01/03/24 118 lb (53.5 kg)    Current Outpatient Medications  Medication Sig Dispense Refill   alendronate  (FOSAMAX ) 70 MG tablet TAKE 1 TABLET ONCE A WEEK ON AN EMPTY STOMACH WITH A FULL GLASS OF WATER 12 tablet 3   amiodarone  (PACERONE ) 200 MG tablet Take 1 tablet (200 mg total) by mouth daily. 90 tablet 1   amLODipine  (NORVASC ) 5 MG tablet Take 1 tablet (5 mg total) by mouth 2 (two) times daily. 180 tablet 1   apixaban  (ELIQUIS ) 2.5 MG TABS tablet Take 1 tablet (2.5 mg total) by  mouth 2 (two) times daily. 60 tablet 5   atorvastatin  (LIPITOR) 40 MG tablet Take 1 tablet (40 mg total) by mouth daily. 90 tablet 1   Cholecalciferol (VITAMIN D3) 125 MCG (5000 UT) CAPS Take 1 capsule by mouth every other day.     escitalopram  (LEXAPRO ) 20 MG tablet Take 2 tablets (40 mg total) by mouth daily. 180 tablet 1   esomeprazole  (NEXIUM ) 40 MG capsule TAKE ONE CAPSULE TWICE DAILY BEFORE MEALS 180 capsule 1   furosemide  (LASIX ) 20 MG tablet Take 1 tablet (20 mg total) by mouth daily. 90 tablet 1   hydrocortisone  (ANUSOL -HC) 2.5 % rectal cream Place 1 application rectally 2 (two) times daily. (Patient taking differently: Place 1 application  rectally 2 (two) times daily as needed for hemorrhoids or anal itching.) 30 g 1   levothyroxine  (SYNTHROID ) 88 MCG tablet TAKE (1) TABLET DAILY BE- FORE BREAKFAST. 90 tablet 1   Multiple Vitamin (MULTIVITAMIN WITH MINERALS) TABS tablet Take 1 tablet by mouth daily.     olmesartan  (BENICAR ) 40 MG tablet Take 1 tablet (40 mg total) by mouth daily. 90 tablet 1   Pediatric Multiple Vitamins (FLINTSTONES PLUS EXTRA C) CHEW Chew by mouth.     polyethylene glycol (MIRALAX  / GLYCOLAX ) 17 g packet Take 17 g by mouth daily.     RESTASIS 0.05 % ophthalmic emulsion Place 1 drop into both eyes 2 (two) times daily.     Wheat Dextrin (BENEFIBER) POWD Take 1 Dose by mouth daily. Take daily     No current facility-administered medications for this visit.    Past Medical History:  Diagnosis Date   Abnormal transaminases 05/09/2019   Abnormality of gait 08/25/2016   Adenomatous rectal polyp    Anxiety about health 05/09/2019   Arthritis 04/03/2018   L hip   Atrial fibrillation (HCC)    Bleeding internal hemorrhoids    Cataract    Chronic back pain    Depression    Diverticulosis of colon (without mention of hemorrhage)    Gastroparesis    GERD (gastroesophageal reflux disease)    Hiatal hernia 09/1999   3cm   Hyperlipidemia    Hypertension    Hypothyroid     Ischemic colitis (HCC)    Left foot drop 08/25/2016   Lichen sclerosus et atrophicus of the vulva    MVP (mitral valve prolapse)    Osteoporosis    Sleep apnea    TIA (transient ischemic attack)    Weakness     Past Surgical History:  Procedure Laterality Date   CARDIAC CATHETERIZATION N/A 02/03/2016   Procedure: Coronary/Graft Angiography;  Surgeon: Rober Chroman, MD;  Location: [facility];  Service: Cardiovascular;  Laterality: N/A;   CATARACT EXTRACTION     CHOLECYSTECTOMY     COLONOSCOPY     Multiple, polyps   ESOPHAGEAL MANOMETRY N/A 06/14/2016  Procedure: ESOPHAGEAL MANOMETRY (EM);  Surgeon: Lupita FORBES Commander, MD;  Location: [facility];  Service: Endoscopy;  Laterality: N/A;   LEFT HEART CATHETERIZATION WITH CORONARY ANGIOGRAM N/A 07/17/2013   Procedure: LEFT HEART CATHETERIZATION WITH CORONARY ANGIOGRAM;  Surgeon: Rober LOISE Chroman, MD;  Location: [facility]ATH [facility];  Service: Cardiovascular;  Laterality: N/A;   SIGMOIDOSCOPY  08/23/2011   TUBAL LIGATION     UPPER GASTROINTESTINAL ENDOSCOPY  08/23/2011   Normal    Family History  Problem Relation Age of Onset   Stomach cancer Brother    Colon cancer Paternal Aunt    Lung cancer Brother        smoker   Esophageal cancer Father    Stroke Mother     Allergies as of 07/19/2024 - Review Complete 07/19/2024  Allergen Reaction Noted   Contrast media [iodinated contrast media] Shortness Of Breath, Nausea Only, and Swelling 07/08/2011   Iodine Shortness Of Breath, Nausea Only, and Swelling 02/17/2011   Iohexol  Hives and Swelling 05/05/2004   Ioxaglate Nausea Only, Shortness Of Breath, and Swelling 05/26/2016   Bactrim  [sulfamethoxazole -trimethoprim ] Nausea And Vomiting 10/26/2021   Metoclopramide  Other (See Comments) 03/30/2012    Social History   Socioeconomic History   Marital status: Widowed    Spouse name: Not on file   Number of children: 9   Years of education: 12   Highest education level: 12th grade   Occupational History   Occupation: retired    Associate Professor: RETIRED  Tobacco Use   Smoking status: Never    Passive exposure: Never   Smokeless tobacco: Never  Vaping Use   Vaping status: Never Used  Substance and Sexual Activity   Alcohol use: No   Drug use: No   Sexual activity: Not Currently    Partners: Male  Other Topics Concern   Not on file  Social History Narrative   Lives at home alone.   Right-handed.   2 cups caffeine most days.   17 grandkids   21 great grand kids   Social Drivers of Corporate investment banker Strain: Low Risk  (02/27/2024)   Overall Financial Resource Strain (CARDIA)    Difficulty of Paying Living Expenses: Not hard at all  Food Insecurity: No Food Insecurity (02/27/2024)   Hunger Vital Sign    Worried About Running Out of Food in the Last Year: Never true    Ran Out of Food in the Last Year: Never true  Transportation Needs: No Transportation Needs (02/27/2024)   PRAPARE - Administrator, Civil Service (Medical): No    Lack of Transportation (Non-Medical): No  Physical Activity: Insufficiently Active (02/27/2024)   Exercise Vital Sign    Days of Exercise per Week: 3 days    Minutes of Exercise per Session: 30 min  Stress: No Stress Concern Present (02/27/2024)   [employer] of Occupational Health - Occupational Stress Questionnaire    Feeling of Stress : Not at all  Social Connections: Moderately Isolated (02/27/2024)   Social Connection and Isolation Panel    Frequency of Communication with Friends and Family: More than three times a week    Frequency of Social Gatherings with Friends and Family: Three times a week    Attends Religious Services: 1 to 4 times per year    Active Member of Clubs or Organizations: No    Attends Banker Meetings: Never    Marital Status: Widowed     Review of Systems   Gen: Denies fever,  chills, anorexia. Denies fatigue, weakness, weight loss.  CV: Denies chest pain, palpitations,  syncope, peripheral edema, and claudication. Resp: Denies dyspnea at rest, cough, wheezing, coughing up blood, and pleurisy. GI: See HPI Derm: Denies rash, itching, dry skin Psych: Denies depression, anxiety, memory loss, confusion. No homicidal or suicidal ideation.  Heme: Denies bruising, bleeding, and enlarged lymph nodes.  Physical Exam   BP (!) 151/64 (BP Location: Right Arm, Patient Position: Sitting, Cuff Size: Normal)   Pulse (!) 56   Temp 97.9 F (36.6 C) (Oral)   Ht 5' 1 (1.549 m)   Wt 111 lb 3.2 oz (50.4 kg)   SpO2 96%   BMI 21.01 kg/m   General:   Alert and oriented. Pleasant and cooperative. Thin, frail appearing.  Head:  Normocephalic and atraumatic. Eyes:  Conjuctiva clear without scleral icterus. Mouth:  Oral mucosa pink and moist. Good dentition. No lesions. Abdomen:  +BS, soft,  non-distended.  Mild TTP to epigastrium and periumbilical region.  No rebound or guarding. No HSM or masses noted. Rectal: deferred Msk:   Normal posture.  Unsteady gait. Extremities:  Without edema. Neurologic:  Alert and  oriented x4 Psych:  Alert and cooperative. Normal mood and affect.  Assessment  Erica [name] is a 89 y.o. female presenting today with nausea, abdominal pain, lack of appetite, weight loss.  Gastroparesis, nausea/vomiting, weight loss, lack of appetite, abdominal pain: Long-term history of gastroparesis, she feels like this is getting worse.  Reports about a 10 pound weight loss over the last few months but symptoms have been worsened over the last several weeks.  Has not been taking anything over-the-counter for nausea or anything as needed for nausea.  States occasionally at nighttime she is having regurgitation of bile.  Has associated upper abdominal bloating and reports some occasional distention.  Has tried Reglan  in the past and was unable to tolerate, unsure of exact reason.  Eats 3 meals per day on average, usually light breakfast and lunch and a larger  dinner.  Given her weight loss and lack of appetite will obtain chest x-ray and KUB.  She has fear of even premedication with receiving IV contrast therefore cannot update CT abdomen pelvis without contrast and complete mesenteric Dopplers to assess for malignancy and ischemic colitis given abdominal pain, weight loss, and lack of appetite.  Patient herself stated today  I am afraid I have cancer.  Given her abdominal pain, if KUB reveals constipation then we will treat with bowel regimen.  GERD: She denies any significant reflux symptoms and only takes Nexium  as needed.  Denies any significant burning, more so having issues with lack of appetite, nausea, and epigastric pain as above.  Currently advised consistent daily use of Nexium  as all of her other symptoms as above could be secondary to uncontrolled acid reflux.  Symptoms typically occurring at night.  Melena: Reported 1 episode of black stool that she states she feels like it contains some mucus.  After further questioning it appears this likely did occur after taking dose of Pepto-Bismol.  Given reports of melena, will check CBC, iron panel, and complete Hemoccult cards x 3.  PLAN   CBC, iron panel, FOBT cards x3 Chest x-ray, KUB Mesenteric dopplers and CT A/P without contrast if x-rays unrevealing. Zofran  4 mg 3 times daily as needed Continue Nexium  40 mg once daily consistently.  If constipated will start bowel regimen. Continue Ensure 1-2 daily Follow up 6 weeks for weight check    Charmaine Melia,  MSN, [name]-BC, AGACNP-BC [facility]

## 2024-07-19 NOTE — Patient Instructions (Addendum)
 I am ordering chest x ray and abdominal xray given the weight loss and assess for constipation.  If these are unrevealing then I will order a CT without contrast of the abdomen as well as mesenteric Dopplers given you are unable to receive IV contrast.  I have sent in Zofran  for you to take 1 tablet under the tongue as needed up to 3 times a day if needed for nausea.  Please have blood work completed at [facility].  We will call you with results once they have been received. Please allow 3-5 business days for review. 2 locations for [facility] in [place]:              1. [address], [place]              2. [address] [place], [place]   Please complete stool cards at home and return back to the office.  Given you are not taking your Nexium  every single day I want you to start taking this at least 30 minutes prior to dinner given symptoms of regurgitation are happening more at night.  I want to make sure you are taking at least 1-2 Ensure daily to try to help maintain or added weight.  Follow up in 6 weeks.  It was a pleasure to see you today. I want to create trusting relationships with patients. If you receive a survey regarding your visit,  I greatly appreciate you taking time to fill this out on paper or through your MyChart. I value your feedback.  Charmaine Melia, MSN, [name]-BC, AGACNP-BC [facility]

## 2024-07-19 NOTE — Telephone Encounter (Signed)
 Error

## 2024-07-23 ENCOUNTER — Ambulatory Visit (INDEPENDENT_AMBULATORY_CARE_PROVIDER_SITE_OTHER): Admitting: Gastroenterology

## 2024-07-23 ENCOUNTER — Other Ambulatory Visit: Payer: Self-pay

## 2024-07-23 DIAGNOSIS — R195 Other fecal abnormalities: Secondary | ICD-10-CM

## 2024-07-23 DIAGNOSIS — K921 Melena: Secondary | ICD-10-CM

## 2024-07-23 DIAGNOSIS — K625 Hemorrhage of anus and rectum: Secondary | ICD-10-CM

## 2024-07-23 LAB — POC HEMOCCULT BLD/STL (HOME/3-CARD/SCREEN)
Card #2 Fecal Occult Blod, POC: POSITIVE
Card #3 Fecal Occult Blood, POC: POSITIVE
Fecal Occult Blood, POC: POSITIVE — AB

## 2024-07-24 ENCOUNTER — Other Ambulatory Visit: Payer: Self-pay | Admitting: Nurse Practitioner

## 2024-07-24 ENCOUNTER — Ambulatory Visit: Payer: Self-pay | Admitting: Gastroenterology

## 2024-07-24 DIAGNOSIS — K219 Gastro-esophageal reflux disease without esophagitis: Secondary | ICD-10-CM

## 2024-07-24 DIAGNOSIS — M810 Age-related osteoporosis without current pathological fracture: Secondary | ICD-10-CM

## 2024-07-24 DIAGNOSIS — F3341 Major depressive disorder, recurrent, in partial remission: Secondary | ICD-10-CM

## 2024-07-24 DIAGNOSIS — E039 Hypothyroidism, unspecified: Secondary | ICD-10-CM

## 2024-07-24 DIAGNOSIS — I10 Essential (primary) hypertension: Secondary | ICD-10-CM

## 2024-07-24 DIAGNOSIS — I2510 Atherosclerotic heart disease of native coronary artery without angina pectoris: Secondary | ICD-10-CM

## 2024-07-26 ENCOUNTER — Ambulatory Visit: Payer: Self-pay | Admitting: Gastroenterology

## 2024-07-26 NOTE — Telephone Encounter (Signed)
 Spoke to pt, informed her of results. Pt voiced understanding. Called radiology and result are the chart.

## 2024-07-27 ENCOUNTER — Ambulatory Visit: Payer: Medicare Other | Admitting: Nurse Practitioner

## 2024-07-30 ENCOUNTER — Other Ambulatory Visit: Payer: Self-pay | Admitting: Gastroenterology

## 2024-07-30 ENCOUNTER — Other Ambulatory Visit: Payer: Self-pay | Admitting: *Deleted

## 2024-07-30 DIAGNOSIS — R1013 Epigastric pain: Secondary | ICD-10-CM

## 2024-07-30 DIAGNOSIS — R634 Abnormal weight loss: Secondary | ICD-10-CM

## 2024-07-30 DIAGNOSIS — R630 Anorexia: Secondary | ICD-10-CM

## 2024-08-03 ENCOUNTER — Ambulatory Visit (HOSPITAL_COMMUNITY): Admission: RE | Admit: 2024-08-03 | Source: Ambulatory Visit

## 2024-08-08 ENCOUNTER — Ambulatory Visit (HOSPITAL_COMMUNITY)
Admission: RE | Admit: 2024-08-08 | Discharge: 2024-08-08 | Disposition: A | Discharge status: Home / Self Care | Source: Ambulatory Visit | Attending: Gastroenterology | Admitting: Gastroenterology

## 2024-08-08 DIAGNOSIS — R1013 Epigastric pain: Secondary | ICD-10-CM | POA: Diagnosis not present

## 2024-08-08 DIAGNOSIS — R630 Anorexia: Secondary | ICD-10-CM | POA: Insufficient documentation

## 2024-08-08 DIAGNOSIS — K5730 Diverticulosis of large intestine without perforation or abscess without bleeding: Secondary | ICD-10-CM | POA: Diagnosis not present

## 2024-08-08 DIAGNOSIS — K449 Diaphragmatic hernia without obstruction or gangrene: Secondary | ICD-10-CM | POA: Diagnosis not present

## 2024-08-08 DIAGNOSIS — R634 Abnormal weight loss: Secondary | ICD-10-CM | POA: Insufficient documentation

## 2024-08-08 DIAGNOSIS — R109 Unspecified abdominal pain: Secondary | ICD-10-CM | POA: Diagnosis not present

## 2024-08-08 DIAGNOSIS — R112 Nausea with vomiting, unspecified: Secondary | ICD-10-CM | POA: Diagnosis not present

## 2024-08-09 ENCOUNTER — Ambulatory Visit: Admitting: Nurse Practitioner

## 2024-08-15 DIAGNOSIS — E782 Mixed hyperlipidemia: Secondary | ICD-10-CM | POA: Diagnosis not present

## 2024-08-15 DIAGNOSIS — Z8679 Personal history of other diseases of the circulatory system: Secondary | ICD-10-CM | POA: Diagnosis not present

## 2024-08-15 DIAGNOSIS — I10 Essential (primary) hypertension: Secondary | ICD-10-CM | POA: Diagnosis not present

## 2024-08-15 DIAGNOSIS — I2510 Atherosclerotic heart disease of native coronary artery without angina pectoris: Secondary | ICD-10-CM | POA: Diagnosis not present

## 2024-08-17 ENCOUNTER — Encounter: Payer: Self-pay | Admitting: Gastroenterology

## 2024-08-17 ENCOUNTER — Ambulatory Visit: Payer: Self-pay | Admitting: Gastroenterology

## 2024-08-21 ENCOUNTER — Telehealth: Payer: Self-pay

## 2024-08-21 NOTE — Telephone Encounter (Signed)
 Copied from CRM [id number]. Topic: Clinical - Lab/Test Results >> Aug 21, 2024  3:56 PM Erica [name] wrote: Reason for CRM: CT Scan results

## 2024-08-21 NOTE — Telephone Encounter (Signed)
 The ordering providers office called and has talked with the patient and son.

## 2024-08-23 DIAGNOSIS — S60211A Contusion of right wrist, initial encounter: Secondary | ICD-10-CM | POA: Diagnosis not present

## 2024-08-23 DIAGNOSIS — Z7901 Long term (current) use of anticoagulants: Secondary | ICD-10-CM | POA: Diagnosis not present

## 2024-08-23 DIAGNOSIS — I10 Essential (primary) hypertension: Secondary | ICD-10-CM | POA: Diagnosis not present

## 2024-08-23 DIAGNOSIS — Z79899 Other long term (current) drug therapy: Secondary | ICD-10-CM | POA: Diagnosis not present

## 2024-08-23 DIAGNOSIS — M25531 Pain in right wrist: Secondary | ICD-10-CM | POA: Diagnosis not present

## 2024-08-23 DIAGNOSIS — S60219A Contusion of unspecified wrist, initial encounter: Secondary | ICD-10-CM | POA: Diagnosis not present

## 2024-08-23 DIAGNOSIS — M79641 Pain in right hand: Secondary | ICD-10-CM | POA: Diagnosis not present

## 2024-08-23 DIAGNOSIS — X58XXXA Exposure to other specified factors, initial encounter: Secondary | ICD-10-CM | POA: Diagnosis not present

## 2024-08-23 DIAGNOSIS — M1811 Unilateral primary osteoarthritis of first carpometacarpal joint, right hand: Secondary | ICD-10-CM | POA: Diagnosis not present

## 2024-08-23 DIAGNOSIS — E785 Hyperlipidemia, unspecified: Secondary | ICD-10-CM | POA: Diagnosis not present

## 2024-08-23 DIAGNOSIS — Z79890 Hormone replacement therapy (postmenopausal): Secondary | ICD-10-CM | POA: Diagnosis not present

## 2024-08-30 ENCOUNTER — Ambulatory Visit: Admitting: Gastroenterology

## 2024-09-06 ENCOUNTER — Ambulatory Visit: Admitting: Gastroenterology

## 2024-09-10 ENCOUNTER — Ambulatory Visit: Admitting: Gastroenterology

## 2024-09-26 DIAGNOSIS — R0981 Nasal congestion: Secondary | ICD-10-CM | POA: Diagnosis not present

## 2024-09-26 DIAGNOSIS — R112 Nausea with vomiting, unspecified: Secondary | ICD-10-CM | POA: Diagnosis not present

## 2024-09-26 DIAGNOSIS — Z20822 Contact with and (suspected) exposure to covid-19: Secondary | ICD-10-CM | POA: Diagnosis not present

## 2024-09-26 DIAGNOSIS — R197 Diarrhea, unspecified: Secondary | ICD-10-CM | POA: Diagnosis not present

## 2024-10-15 ENCOUNTER — Other Ambulatory Visit: Payer: Self-pay | Admitting: Nurse Practitioner

## 2024-10-15 DIAGNOSIS — I2510 Atherosclerotic heart disease of native coronary artery without angina pectoris: Secondary | ICD-10-CM

## 2024-10-23 ENCOUNTER — Encounter: Payer: Self-pay | Admitting: Gastroenterology

## 2024-10-23 ENCOUNTER — Other Ambulatory Visit: Payer: Self-pay | Admitting: Nurse Practitioner

## 2024-10-23 ENCOUNTER — Ambulatory Visit (INDEPENDENT_AMBULATORY_CARE_PROVIDER_SITE_OTHER): Admitting: Gastroenterology

## 2024-10-23 VITALS — BP 132/63 | HR 61 | Temp 97.6°F | Ht 61.0 in | Wt 110.0 lb

## 2024-10-23 DIAGNOSIS — K219 Gastro-esophageal reflux disease without esophagitis: Secondary | ICD-10-CM

## 2024-10-23 DIAGNOSIS — K5900 Constipation, unspecified: Secondary | ICD-10-CM

## 2024-10-23 DIAGNOSIS — R630 Anorexia: Secondary | ICD-10-CM | POA: Diagnosis not present

## 2024-10-23 DIAGNOSIS — M810 Age-related osteoporosis without current pathological fracture: Secondary | ICD-10-CM

## 2024-10-23 DIAGNOSIS — R634 Abnormal weight loss: Secondary | ICD-10-CM | POA: Diagnosis not present

## 2024-10-23 DIAGNOSIS — F3341 Major depressive disorder, recurrent, in partial remission: Secondary | ICD-10-CM

## 2024-10-23 DIAGNOSIS — K921 Melena: Secondary | ICD-10-CM | POA: Diagnosis not present

## 2024-10-23 DIAGNOSIS — R140 Abdominal distension (gaseous): Secondary | ICD-10-CM

## 2024-10-23 DIAGNOSIS — K3184 Gastroparesis: Secondary | ICD-10-CM

## 2024-10-23 DIAGNOSIS — I2510 Atherosclerotic heart disease of native coronary artery without angina pectoris: Secondary | ICD-10-CM

## 2024-10-23 DIAGNOSIS — R110 Nausea: Secondary | ICD-10-CM | POA: Diagnosis not present

## 2024-10-23 DIAGNOSIS — R1013 Epigastric pain: Secondary | ICD-10-CM

## 2024-10-23 DIAGNOSIS — E039 Hypothyroidism, unspecified: Secondary | ICD-10-CM

## 2024-10-23 DIAGNOSIS — R195 Other fecal abnormalities: Secondary | ICD-10-CM

## 2024-10-23 DIAGNOSIS — I10 Essential (primary) hypertension: Secondary | ICD-10-CM

## 2024-10-23 MED ORDER — SUCRALFATE 1 G PO TABS: 1.0000 g | ORAL_TABLET | Freq: Three times a day (TID) | ORAL | 0 refills | Status: DC

## 2024-10-23 NOTE — Progress Notes (Signed)
 GI Office Note    Referring Provider: Gladis Mustard, * Primary Care Physician:  Gladis Mustard, [name] Primary Gastroenterologist: Lamar HERO.Rourk, MD  Date:  10/23/2024  ID:  MEGGAN [name], DOB 1930-02-28, MRN [id number]  Chief Complaint   Chief Complaint  Patient presents with   Follow-up    Follow up. Still having same issues    History of Present Illness  Erica [name] is a 89 y.o. female with a history of esophageal dysmotility, gastroparesis, anorexia, internal hemorrhoids, weight loss felt to be secondary to depression, ischemic colitis, HLD, HTN, hypothyroidism, diverticulosis, and A-fib on Eliquis  presenting today with complaint of ongoing bloating, nausea, melena, and abdominal pain.   EGD 2019: -tortuous esophagus -mild schatzki ring s/p dilation -single gastric polyp, hyperplastic and no h.pylori -small hiatal hernia   Colonoscopy 06/2014: -three sessile polyps measuring 5-53mm in ascending colon, tubular adenoma -severe diverticulosis in sigmoid colon   CT A/P without contrast 05/2023 IMPRESSION: 1. Sigmoid diverticulosis without diverticulitis. 2. Small hiatal hernia. 3. Increased attenuation of the liver parenchyma consistent with history of amiodarone  therapy.   OV July 2024 with Sonny Kerns, PA-C.  Had been seen recently by PCP for diarrhea.  Stool culture and C. difficile sample negative.  Stool was Hemoccult positive.  Reported red blood with wiping at that time and having intermittent hemorrhoid flares.  Denied any abdominal pain, heartburn, or dysphagia.  Notes of occasional regurgitation which she blamed on her gastroparesis.  Denied any unintentional weight loss.  Chronically on Eliquis  and was dealing with recurrent UTIs.  CBC was advised to assess for anemia and repeat iFOBT.  Advised to monitor for GI bleeding.   Stool cards x 3 in August 2024 were negative.  Last office visit 07/19/2024.  Patient felt like gastroparesis getting worse that  she is having upper abdominal pain as well as decreased appetite.  Reported also about a 10 pound weight loss and having left sided abdominal pain as well going on for 2-3 months but worse over the last several weeks.  Also reported symptoms of black stool as well as mucus.  Feeling bloated and distended in her upper abdomen, had used Pepto.  Denied BRBPR with bowel movements.  Sometimes feels constipated and will take Dulcolax but not frequently.  Consistently taking Nexium  daily, has not tolerated Reglan  in the past. CBC, iron panel, stool cards, chest x-ray, and KUB ordered.  Also advised mesenteric Dopplers and CT abdomen pelvis without contrast and x-rays unrevealing.  Given Zofran  4 mg 3 times daily as needed and advised to continue Nexium  40 mg once daily consistently.  Advised if she began to feel constipated that she start bowel regimen.  Continue Ensure 1-2 daily and follow-up in 6 weeks for weight check.     Latest Ref Rng & Units 07/19/2024   12:41 PM 01/24/2024    3:22 PM 07/22/2023    4:09 PM  CBC  WBC 4.0 - 10.5 K/uL 6.0  7.3  8.4   Hemoglobin 12.0 - 15.0 g/dL 87.8  87.5  87.5   Hematocrit 36.0 - 46.0 % 36.5  38.3  37.3   Platelets 150 - 400 K/uL 154  249  217    Iron/TIBC/Ferritin/ %Sat    Component Value Date/Time   IRON 111 07/19/2024 1241   TIBC 319 07/19/2024 1241   FERRITIN 86 07/19/2024 1241   FERRITIN 55 01/03/2014 1541   IRONPCTSAT 35 (H) 07/19/2024 1241   IRONPCTSAT 17 01/03/2014 1541   All 3 Hemoccult  stool cards were positive and advised to reach out to radiology to see if a chest x-ray KUB could be read given it had been 5 days since they were performed.  Chest x-ray and KUB without abnormalities.  Fecal samples indicating all presence of blood although discussed hemorrhoids could cause this.  Recommend proceeding with CT abdomen pelvis without contrast as well as mesenteric Dopplers.  CT A/P without contrast 08/08/2024: - No acute findings. - Colonic  diverticulosis, without radiographic evidence of diverticulitis. - Large colonic stool burden again noted; recommend clinical correlation for possible constipation. - Small hiatal hernia. - Stable diffusely increased attenuation of hepatic parenchyma, consistent with history of amiodarone  therapy.   Mesenteric Dopplers 08/08/2024: - No evidence of mesenteric ischemia  On 08/17/24 recommended given symptoms including weight loss, bloating, nausea and epigastric pain: - Nexium  40 mg once daily at least 30 minutes prior to breakfast or dinner, whichever is preferred. -We will schedule upper endoscopy given CT negative and reports of poor p.o. intake, nausea, epigastric pain, and weight loss. - Zofran  4 mg up to 3 times a day as needed - MiraLAX  17 g (1 capful) twice daily for 2 weeks and then may decrease to once daily if having regular bowel movements.  She should expect to have some looser stools/diarrhea as we work to get her cleaned out and bowels moving more regularly.  Advised EGD with Dr. Shaaron given her reports of worsening gastroparesis with lower abdominal pain as well as decreased appetite.  Recommendations were relayed to the patient and she stated that she would think about it and would like to discuss at her upcoming office visit.  Today:   Discussed the use of AI scribe software for clinical note transcription with the patient, who gave verbal consent to proceed.  She experiences ongoing gastrointestinal symptoms including nausea, bloating, and constipation. These include nausea, bloating, and constipation. Her bowel movements have improved with the use of Miralax  and occasionally Dulcolax, allowing for daily bowel movements. However, she sometimes experiences diarrhea, described as 'mucousy.'  She has a history of black stools and reports that her food sometimes comes back up at night, which she attributes to reflux. A previous endoscopy in 2018 showed significant inflammation. She  is currently taking Nexium  40 mg twice a day for reflux. She has concerns about performing an upper endoscopy as well nad after discussion with me and her daughter she would like to hold off for now. prefers imaging and medication trial first.   She has a history of weight loss and difficulty eating. She follows dietary advice to eat three to four small meals a day and consumes Ensure, Boost, peanut butter crackers, eggs, and grits to maintain her nutrition.  She is concerned about her ability to be sedated for procedures due to her age, as her son believes she is 'too old' to be put to sleep. She wants to avoid a colonoscopy , feeling that her constipation has improved.      Wt Readings from Last 6 Encounters:  10/23/24 110 lb (49.9 kg)  07/19/24 111 lb 3.2 oz (50.4 kg)  05/04/24 120 lb (54.4 kg)  02/27/24 119 lb (54 kg)  01/24/24 119 lb (54 kg)  01/03/24 118 lb (53.5 kg)    Body mass index is 20.78 kg/m.   Current Outpatient Medications  Medication Sig Dispense Refill   alendronate  (FOSAMAX ) 70 MG tablet TAKE ONE TABLET ONCE A WEEK ON AN EMPTY STOMACH WITH full GLASS of water 12 tablet  0   amiodarone  (PACERONE ) 200 MG tablet TAKE ONE TABLET DAILY 90 tablet 0   amLODipine  (NORVASC ) 5 MG tablet TAKE ONE TABLET TWICE DAILY 180 tablet 0   apixaban  (ELIQUIS ) 2.5 MG TABS tablet Take 1 tablet (2.5 mg total) by mouth 2 (two) times daily. **NEEDS TO BE SEEN BEFORE NEXT REFILL** 60 tablet 0   atorvastatin  (LIPITOR) 40 MG tablet TAKE ONE TABLET ONCE DAILY 90 tablet 0   Cholecalciferol (VITAMIN D3) 125 MCG (5000 UT) CAPS Take 1 capsule by mouth every other day.     escitalopram  (LEXAPRO ) 20 MG tablet TAKE TWO TABLETS DAILY AS DIRECTED 180 tablet 0   esomeprazole  (NEXIUM ) 40 MG capsule TAKE ONE CAPSULE TWICE DAILY BEFORE MEALS 180 capsule 0   furosemide  (LASIX ) 20 MG tablet Take 1 tablet (20 mg total) by mouth daily. (Patient taking differently: Take 20 mg by mouth daily. PRN) 90 tablet 1    levothyroxine  (SYNTHROID ) 88 MCG tablet TAKE ONE TABLET DAILY BEFORE BREAKFAST 90 tablet 0   Multiple Vitamin (MULTIVITAMIN WITH MINERALS) TABS tablet Take 1 tablet by mouth daily.     olmesartan  (BENICAR ) 40 MG tablet TAKE ONE TABLET DAILY 90 tablet 0   ondansetron  (ZOFRAN -ODT) 4 MG disintegrating tablet Take 4 mg by mouth every 8 (eight) hours as needed.     Pediatric Multiple Vitamins (FLINTSTONES PLUS EXTRA C) CHEW Chew by mouth.     polyethylene glycol (MIRALAX  / GLYCOLAX ) 17 g packet Take 17 g by mouth daily.     RESTASIS 0.05 % ophthalmic emulsion Place 1 drop into both eyes 2 (two) times daily.     Wheat Dextrin (BENEFIBER) POWD Take 1 Dose by mouth daily. Take daily     hydrocortisone  (ANUSOL -HC) 2.5 % rectal cream Place 1 application rectally 2 (two) times daily. (Patient taking differently: Place 1 application  rectally 2 (two) times daily as needed for hemorrhoids or anal itching.) 30 g 1   No current facility-administered medications for this visit.    Past Medical History:  Diagnosis Date   Abnormal transaminases 05/09/2019   Abnormality of gait 08/25/2016   Adenomatous rectal polyp    Anxiety about health 05/09/2019   Arthritis 04/03/2018   L hip   Atrial fibrillation (HCC)    Bleeding internal hemorrhoids    Cataract    Chronic back pain    Depression    Diverticulosis of colon (without mention of hemorrhage)    Gastroparesis    GERD (gastroesophageal reflux disease)    Hiatal hernia 09/1999   3cm   Hyperlipidemia    Hypertension    Hypothyroid    Ischemic colitis    Left foot drop 08/25/2016   Lichen sclerosus et atrophicus of the vulva    MVP (mitral valve prolapse)    Osteoporosis    Sleep apnea    TIA (transient ischemic attack)    Weakness     Past Surgical History:  Procedure Laterality Date   CARDIAC CATHETERIZATION N/A 02/03/2016   Procedure: Coronary/Graft Angiography;  Surgeon: Rober Chroman, MD;  Location: [facility]ASIVE [facility];  Service:  Cardiovascular;  Laterality: N/A;   CATARACT EXTRACTION     CHOLECYSTECTOMY     COLONOSCOPY     Multiple, polyps   ESOPHAGEAL MANOMETRY N/A 06/14/2016   Procedure: ESOPHAGEAL MANOMETRY (EM);  Surgeon: Lupita FORBES Commander, MD;  Location: [facility] ENDOSCOPY;  Service: Endoscopy;  Laterality: N/A;   LEFT HEART CATHETERIZATION WITH CORONARY ANGIOGRAM N/A 07/17/2013   Procedure: LEFT HEART CATHETERIZATION WITH  CORONARY ANGIOGRAM;  Surgeon: Rober LOISE Chroman, MD;  Location: [facility] CATH LAB;  Service: Cardiovascular;  Laterality: N/A;   SIGMOIDOSCOPY  08/23/2011   TUBAL LIGATION     UPPER GASTROINTESTINAL ENDOSCOPY  08/23/2011   Normal    Family History  Problem Relation Age of Onset   Stomach cancer Brother    Colon cancer Paternal Aunt    Lung cancer Brother        smoker   Esophageal cancer Father    Stroke Mother     Allergies as of 10/23/2024 - Review Complete 10/23/2024  Allergen Reaction Noted   Contrast media [iodinated contrast media] Shortness Of Breath, Nausea Only, and Swelling 07/08/2011   Iodine Shortness Of Breath, Nausea Only, and Swelling 02/17/2011   Iohexol  Hives and Swelling 05/05/2004   Ioxaglate Nausea Only, Shortness Of Breath, and Swelling 05/26/2016   Bactrim  [sulfamethoxazole -trimethoprim ] Nausea And Vomiting 10/26/2021   Metoclopramide  Other (See Comments) 03/30/2012    Social History   Socioeconomic History   Marital status: Widowed    Spouse name: Not on file   Number of children: 9   Years of education: 12   Highest education level: 12th grade  Occupational History   Occupation: retired    Associate Professor: RETIRED  Tobacco Use   Smoking status: Never    Passive exposure: Never   Smokeless tobacco: Never  Vaping Use   Vaping status: Never Used  Substance and Sexual Activity   Alcohol use: No   Drug use: No   Sexual activity: Not Currently    Partners: Male  Other Topics Concern   Not on file  Social History Narrative   Lives at home alone.   Right-handed.   2  cups caffeine most days.   17 grandkids   21 great grand kids   Social Drivers of Corporate Investment Banker Strain: Low Risk  (02/27/2024)   Overall Financial Resource Strain (CARDIA)    Difficulty of Paying Living Expenses: Not hard at all  Food Insecurity: No Food Insecurity (02/27/2024)   Hunger Vital Sign    Worried About Running Out of Food in the Last Year: Never true    Ran Out of Food in the Last Year: Never true  Transportation Needs: No Transportation Needs (02/27/2024)   PRAPARE - Administrator, Civil Service (Medical): No    Lack of Transportation (Non-Medical): No  Physical Activity: Insufficiently Active (02/27/2024)   Exercise Vital Sign    Days of Exercise per Week: 3 days    Minutes of Exercise per Session: 30 min  Stress: No Stress Concern Present (02/27/2024)   [employer] of Occupational Health - Occupational Stress Questionnaire    Feeling of Stress : Not at all  Social Connections: Moderately Isolated (02/27/2024)   Social Connection and Isolation Panel    Frequency of Communication with Friends and Family: More than three times a week    Frequency of Social Gatherings with Friends and Family: Three times a week    Attends Religious Services: 1 to 4 times per year    Active Member of Clubs or Organizations: No    Attends Banker Meetings: Never    Marital Status: Widowed    Review of Systems   Gen: + weight loss. Denies fever, chills, anorexia. Denies fatigue, weakness CV: Denies chest pain, palpitations, syncope, peripheral edema, and claudication. Resp: Denies dyspnea at rest, cough, wheezing, coughing up blood, and pleurisy. GI: See HPI Psych: + memory loss. Denies  depression, anxiety, confusion. No homicidal or suicidal ideation.  Heme: Denies bruising, bleeding, and enlarged lymph nodes. + TMJ  Physical Exam   BP 132/63 (BP Location: Right Arm, Patient Position: Sitting, Cuff Size: Normal)   Pulse 61   Temp 97.6 F (36.4  C) (Temporal)   Ht 5' 1 (1.549 m)   Wt 110 lb (49.9 kg)   BMI 20.78 kg/m   General:   Alert and oriented. No distress noted. Pleasant and cooperative. Thin appearing.  Head:  Normocephalic and atraumatic. Eyes:  Conjuctiva clear without scleral icterus. Mouth:  Oral mucosa pink and moist. Good dentition. No lesions. Abdomen:  +BS, soft, non-distended. Mild ttp to RUQ and epigastrium. No rebound or guarding. No HSM or masses noted. Rectal: deferred Msk:  Symmetrical without gross deformities. Normal posture. Extremities:  Without edema. Neurologic:  Alert and  oriented x4 Psych:  Alert and cooperative. Normal mood and affect.  Assessment & Plan  Erica [name] is a 89 y.o. female presenting today with ongoing bloating, nausea, abdominal pain.    Gastroparesis with associated nausea and abnormal weight loss Chronic gastroparesis with nausea and weight loss. Symptoms include food regurgitation and reflux, likely due to gastroparesis and GERD. Previous medications like Reglan  were not tolerated. - Consider Motegrity for nausea and constipation if constipation persists after x-ray results - Continue small, frequent meals - Monitor weight - Discuss upper endoscopy for potential ulceration or structural issues, with informed consent regarding sedation risks. She has requested to hold off on upper endoscopy for now, we did discuss all the potential complications with the procedure although also advised that we could be missing potential malignancies or other abnormalities for evaluation and treatment of her symptoms.  Constipation Chronic constipation with occasional diarrhea, possibly due to overflow. Bowel movements improved with Miralax  and Dulcolax, but residual constipation likely remains. Mucus in stool likely a protective mechanism, unclear if possible inflammatory response.  - Order abdominal x-ray to evaluate for residual constipation - Continue Miralax  daily and Dulcolax as  needed - Consider increasing Miralax  to twice daily if x-ray shows persistent constipation vs switching to motegrity.   Gastroesophageal reflux disease (GERD) Chronic GERD with reflux and regurgitation. Previous endoscopy showed significant inflammation. Currently on Nexium , but symptoms persist. - Prescribe Carafate for two weeks for reflux and nausea - Continue Nexium  40 mg twice daily - Consider adjusting reflux medication if symptoms persist after Carafate trial  Melena, heme positive stool Intermittent melena with heme positive stool x3. Previous reports of black stool and nausea. Discussed possibility of ulceration or structural issues in the upper GI tract. Last endoscopy in 2018, repeat considered to rule out significant pathology.  - Discuss upper endoscopy for potential ulceration or structural issues, with informed consent regarding sedation risks. -Discussed evaluation with colonoscopy as well given her bloating as well as her prior weight loss and the heme positive stools however she would like to hold off on colonoscopy for now as well, again discussed to potential procedural risks and not assessing for any potential etiologies for her symptoms like the mucus in her stools and changed to constipation in regards to identifying potential malignancy.     Follow up   Follow up 2-3 months.    Charmaine Melia, MSN, [name]-BC, AGACNP-BC [facility]

## 2024-10-23 NOTE — Patient Instructions (Addendum)
 I have ordered the xray of your abdomen for you.  You can walk in at anytime 8-4 Monday - Friday to have this done.   I have sent in sucralfate, Carafate for you to take up to 4 times a day for 2 weeks.  Continue taking your Nexium  40 mg twice daily  Gastroparesis recommendations:  4-6 small meals daily Low fat diet Low fiber diet (avoid raw fruits and vegetables).  Follow a GERD diet:  Avoid fried, fatty, greasy, spicy, citrus foods. Avoid caffeine and carbonated beverages. Avoid chocolate. Try eating 4-6 small meals a day rather than 3 large meals. Do not eat within 3 hours of laying down. Prop head of bed up on wood or bricks to create a 6 inch incline.  I will need to continue MiraLAX  17 g once daily for constipation as well as MiraLAX  if needed.  I will give you further recommendations after I receive your x-ray results.  The manage you are doing in 2 weeks with the Carafate, continuing of symptoms will likely change her Nexium  to something different.  Continue drinking Ensure/boost daily.  Follow-up in 2-3 months.   It was a pleasure to see you today. I want to create trusting relationships with patients. If you receive a survey regarding your visit,  I greatly appreciate you taking time to fill this out on paper or through your MyChart. I value your feedback.  Charmaine Melia, MSN, [name]-BC, AGACNP-BC [facility]

## 2024-10-30 ENCOUNTER — Ambulatory Visit (HOSPITAL_COMMUNITY)
Admission: RE | Admit: 2024-10-30 | Discharge: 2024-10-30 | Disposition: A | Discharge status: Home / Self Care | Source: Ambulatory Visit | Attending: Gastroenterology | Admitting: Gastroenterology

## 2024-10-30 DIAGNOSIS — R1013 Epigastric pain: Secondary | ICD-10-CM | POA: Insufficient documentation

## 2024-10-30 DIAGNOSIS — K5900 Constipation, unspecified: Secondary | ICD-10-CM | POA: Diagnosis not present

## 2024-10-30 DIAGNOSIS — R140 Abdominal distension (gaseous): Secondary | ICD-10-CM | POA: Insufficient documentation

## 2024-10-30 DIAGNOSIS — R109 Unspecified abdominal pain: Secondary | ICD-10-CM | POA: Diagnosis not present

## 2024-10-30 DIAGNOSIS — R110 Nausea: Secondary | ICD-10-CM | POA: Insufficient documentation

## 2024-10-30 DIAGNOSIS — I878 Other specified disorders of veins: Secondary | ICD-10-CM | POA: Diagnosis not present

## 2024-11-06 ENCOUNTER — Ambulatory Visit: Payer: Self-pay | Admitting: Gastroenterology

## 2024-11-06 DIAGNOSIS — K5900 Constipation, unspecified: Secondary | ICD-10-CM

## 2024-11-06 DIAGNOSIS — K5909 Other constipation: Secondary | ICD-10-CM

## 2024-11-06 DIAGNOSIS — K3184 Gastroparesis: Secondary | ICD-10-CM

## 2024-11-06 MED ORDER — PRUCALOPRIDE SUCCINATE 2 MG PO TABS: 2.0000 mg | ORAL_TABLET | Freq: Every day | ORAL | 3 refills | Status: AC

## 2024-11-13 ENCOUNTER — Other Ambulatory Visit: Payer: Self-pay | Admitting: Gastroenterology

## 2024-11-13 DIAGNOSIS — R140 Abdominal distension (gaseous): Secondary | ICD-10-CM

## 2024-11-13 DIAGNOSIS — K219 Gastro-esophageal reflux disease without esophagitis: Secondary | ICD-10-CM

## 2024-11-13 DIAGNOSIS — K3184 Gastroparesis: Secondary | ICD-10-CM

## 2024-11-15 ENCOUNTER — Other Ambulatory Visit: Payer: Self-pay | Admitting: Nurse Practitioner

## 2024-11-15 ENCOUNTER — Encounter: Payer: Self-pay | Admitting: Nurse Practitioner

## 2024-11-15 DIAGNOSIS — M810 Age-related osteoporosis without current pathological fracture: Secondary | ICD-10-CM

## 2024-11-15 DIAGNOSIS — K219 Gastro-esophageal reflux disease without esophagitis: Secondary | ICD-10-CM

## 2024-11-15 DIAGNOSIS — F3341 Major depressive disorder, recurrent, in partial remission: Secondary | ICD-10-CM

## 2024-11-15 DIAGNOSIS — I10 Essential (primary) hypertension: Secondary | ICD-10-CM

## 2024-11-15 DIAGNOSIS — I2510 Atherosclerotic heart disease of native coronary artery without angina pectoris: Secondary | ICD-10-CM

## 2024-11-15 DIAGNOSIS — E039 Hypothyroidism, unspecified: Secondary | ICD-10-CM

## 2024-11-15 NOTE — Telephone Encounter (Signed)
 MMM pt NTBS 30-d given 10/23/24

## 2024-11-15 NOTE — Telephone Encounter (Signed)
Left message to schedule appt and will mail letter

## 2024-11-16 ENCOUNTER — Telehealth: Payer: Self-pay | Admitting: Nurse Practitioner

## 2024-11-16 ENCOUNTER — Ambulatory Visit: Payer: Self-pay

## 2024-11-16 ENCOUNTER — Other Ambulatory Visit: Payer: Self-pay | Admitting: Nurse Practitioner

## 2024-11-16 DIAGNOSIS — I2510 Atherosclerotic heart disease of native coronary artery without angina pectoris: Secondary | ICD-10-CM

## 2024-11-16 NOTE — Telephone Encounter (Unsigned)
 Copied from CRM [id number]. Topic: Clinical - Medical Advice >> Nov 16, 2024 12:53 PM Delon DASEN wrote: Reason for CRM: questions about MRI- [phone]

## 2024-11-16 NOTE — Telephone Encounter (Signed)
 FYI Only or Action Required?: FYI only for provider: ED advised.  Patient was last seen in primary care on 01/24/2024 by Gladis Mustard, [name].  Called Nurse Triage reporting Fall.  Symptoms began a week ago.  Interventions attempted: Other: n/a.  Symptoms are: gradually improving.  Triage Disposition: Go to ED Now (Notify PCP)  Patient/caregiver understands and will follow disposition?: Declines     Copied from CRM #[id number]. Topic: Clinical - Red Word Triage >> Nov 16, 2024 12:05 PM Delon HERO wrote: Red Word that prompted transfer to Nurse Triage: Patient is calling to report that she fell last Friday bruised her hand. Please advise Reason for Disposition  Injury (or injuries) that need emergency care    Blood thinners and hit head  Answer Assessment - Initial Assessment Questions Pt called in because she needs a refill on her eliquis . She states she has 2 pills left. She states she can make an appt on a Monday or Tuesday. She states she recently had a fall (last Friday). States she reached to far for her bathroom door handle and lost her balance and fell backwards into a table. She does have a bruise on her hand. She did admit to hitting her head. She stated multiple times that she is fine from the fall, she just needs the eliquis  refilled. RN advised because she hit her head and is on blood thinners recommendations is to go to the ER. Pt states she would talk to her kids and let them know but refused 911 and again stated she was fine from the fall.     1. MECHANISM: How did the fall happen?     Reached for door handle lost balance.  2. DOMESTIC VIOLENCE AND ELDER ABUSE SCREENING: Did you fall because someone pushed you or tried to hurt you? If Yes, ask: Are you safe now?     no 3. ONSET: When did the fall happen? (e.g., minutes, hours, or days ago)     Last Friday.  4. LOCATION: What part of the body hit the ground? (e.g., back, buttocks, head, hips, knees,  hands, head, stomach)     Did admit to hitting her head on a table and her hand.  5. INJURY: Did you hurt (injure) yourself when you fell? If Yes, ask: What did you injure? Tell me more about this? (e.g., body area; type of injury; pain severity)     Hand. And head, denies any bruising on head, yes bruising on hand.  9. OTHER SYMPTOMS: Do you have any other symptoms? (e.g., dizziness, fever, weakness; new-onset or worsening).      Denies any other symptoms 10. CAUSE: What do you think caused the fall (or falling)? (e.g., dizzy spell, tripped)       Reaching too far forward.  Protocols used: Falls and [facility]

## 2024-11-16 NOTE — Telephone Encounter (Signed)
 CAL notified

## 2024-11-16 NOTE — Telephone Encounter (Signed)
 Copied from CRM [id number]. Topic: Clinical - Medication Refill >> Nov 16, 2024 12:09 PM Delon HERO wrote: Medication: apixaban  (ELIQUIS ) 2.5 MG TABS tablet [[id number]]  Has the patient contacted their pharmacy? Yes (Agent: If no, request that the patient contact the pharmacy for the refill. If patient does not wish to contact the pharmacy document the reason why and proceed with request.) (Agent: If yes, when and what did the pharmacy advise?)  This is the patient's preferred pharmacy:  [facility] [place], [place] - [address] [place] [address] Phone: [phone] Fax: [phone]    Is this the correct pharmacy for this prescription? Yes If no, delete pharmacy and type the correct one.   Has the prescription been filled recently? Yes  Is the patient out of the medication? Yes has 2 pills left  Has the patient been seen for an appointment in the last year OR does the patient have an upcoming appointment? Yes  Can we respond through MyChart? Yes  Agent: Please be advised that Rx refills may take up to 3 business days. We ask that you follow-up with your pharmacy.

## 2024-11-16 NOTE — Telephone Encounter (Signed)
 Talked w/ son Norleen about what the call center Triage nurse said and then discussed the other phone call about an MRI, he stated his mom call about having this done. He has talked to her that she can not have this requested that would have to be the providers decision. He was going to go see her here in a little while, see how she is doing and will call back if she needs to be seen or note.

## 2024-11-16 NOTE — Telephone Encounter (Signed)
 I called and spoke with patient. She confirmed that she did have a fall about a week ago and did hit her head. Since then she has been having dizzy spells. I reiterated to her that she really needs to go to the ER and be checked out. Patient voiced understanding and said she would get in touch with someone in her family and have them take her tonight.

## 2024-11-16 NOTE — Telephone Encounter (Signed)
 Closing encounter, talked to son Norleen about in another encounter.

## 2024-11-16 NOTE — Telephone Encounter (Signed)
 Copied from CRM [id number]. Topic: General - Other >> Nov 16, 2024 12:53 PM Zebedee SAUNDERS wrote: Reason for CRM: Received call from pt's daughter-in-law Sagan Wurzel not listed on HIPAA stated that pt did not understand nurse triage instructions. Please contact son or daughter on HIPAA list.

## 2024-11-19 ENCOUNTER — Telehealth: Payer: Self-pay | Admitting: Family Medicine

## 2024-11-19 DIAGNOSIS — I2510 Atherosclerotic heart disease of native coronary artery without angina pectoris: Secondary | ICD-10-CM

## 2024-11-19 MED ORDER — APIXABAN 2.5 MG PO TABS
2.5000 mg | ORAL_TABLET | Freq: Two times a day (BID) | ORAL | 0 refills | Status: AC
Start: 2024-11-19 — End: ?

## 2024-11-19 NOTE — Telephone Encounter (Signed)
 Copied from CRM #[id number]. Topic: Clinical - Medication Question >> Nov 19, 2024  1:34 PM Avram MATSU wrote: Reason for CRM: patient would like her medication to be refilled she stated she has not had it in 3 days. I did inform her she will need to be seen. Please advise [phone]

## 2024-11-19 NOTE — Telephone Encounter (Signed)
 Patient inquiring if she can get her eliquis  filled today. Patient states she is unable to come in as she relies on her children for transportation sand will have to see when they are available to bring her in for an appointment. Patient has been out if the eliquis  for a few days. Patient very frustrated and is requesting this to be sent in today.

## 2024-11-19 NOTE — Telephone Encounter (Signed)
 Sent one month of medication to pharmacy and called and spoke with patient. Advised patient that she would need to be seen within the next month with MMM. She stated that she would talk with her kids and call us  and make an appt.

## 2024-12-10 ENCOUNTER — Ambulatory Visit: Admitting: Nurse Practitioner

## 2024-12-10 ENCOUNTER — Encounter: Payer: Self-pay | Admitting: Nurse Practitioner

## 2024-12-10 VITALS — BP 127/70 | HR 59 | Temp 96.0°F | Ht 61.0 in | Wt 108.0 lb

## 2024-12-10 DIAGNOSIS — I2510 Atherosclerotic heart disease of native coronary artery without angina pectoris: Secondary | ICD-10-CM | POA: Diagnosis not present

## 2024-12-10 DIAGNOSIS — F3341 Major depressive disorder, recurrent, in partial remission: Secondary | ICD-10-CM | POA: Diagnosis not present

## 2024-12-10 DIAGNOSIS — Z23 Encounter for immunization: Secondary | ICD-10-CM

## 2024-12-10 DIAGNOSIS — K219 Gastro-esophageal reflux disease without esophagitis: Secondary | ICD-10-CM

## 2024-12-10 DIAGNOSIS — E039 Hypothyroidism, unspecified: Secondary | ICD-10-CM

## 2024-12-10 DIAGNOSIS — K3184 Gastroparesis: Secondary | ICD-10-CM | POA: Diagnosis not present

## 2024-12-10 DIAGNOSIS — E782 Mixed hyperlipidemia: Secondary | ICD-10-CM | POA: Diagnosis not present

## 2024-12-10 DIAGNOSIS — H65191 Other acute nonsuppurative otitis media, right ear: Secondary | ICD-10-CM | POA: Diagnosis not present

## 2024-12-10 DIAGNOSIS — R4589 Other symptoms and signs involving emotional state: Secondary | ICD-10-CM | POA: Diagnosis not present

## 2024-12-10 DIAGNOSIS — I10 Essential (primary) hypertension: Secondary | ICD-10-CM

## 2024-12-10 DIAGNOSIS — Z6827 Body mass index (BMI) 27.0-27.9, adult: Secondary | ICD-10-CM | POA: Diagnosis not present

## 2024-12-10 MED ORDER — FLUTICASONE PROPIONATE 50 MCG/ACT NA SUSP: 2.0000 | Freq: Every day | NASAL | 6 refills | Status: AC

## 2024-12-10 MED ORDER — AMLODIPINE BESYLATE 5 MG PO TABS: 5.0000 mg | ORAL_TABLET | Freq: Two times a day (BID) | ORAL | 1 refills | Status: DC

## 2024-12-10 MED ORDER — AMIODARONE HCL 200 MG PO TABS: 200.0000 mg | ORAL_TABLET | Freq: Every day | ORAL | 1 refills | Status: AC

## 2024-12-10 MED ORDER — FUROSEMIDE 20 MG PO TABS: 20.0000 mg | ORAL_TABLET | Freq: Every day | ORAL | 1 refills | Status: AC

## 2024-12-10 MED ORDER — OLMESARTAN MEDOXOMIL 40 MG PO TABS: 40.0000 mg | ORAL_TABLET | Freq: Every day | ORAL | 1 refills | Status: DC

## 2024-12-10 MED ORDER — ATORVASTATIN CALCIUM 40 MG PO TABS: 40.0000 mg | ORAL_TABLET | Freq: Every day | ORAL | 1 refills | Status: AC

## 2024-12-10 MED ORDER — APIXABAN 2.5 MG PO TABS: 2.5000 mg | ORAL_TABLET | Freq: Two times a day (BID) | ORAL | 1 refills | Status: DC

## 2024-12-10 MED ORDER — LEVOTHYROXINE SODIUM 88 MCG PO TABS: ORAL_TABLET | ORAL | 1 refills | Status: AC

## 2024-12-10 MED ORDER — ESOMEPRAZOLE MAGNESIUM 40 MG PO CPDR: DELAYED_RELEASE_CAPSULE | ORAL | 1 refills | Status: AC

## 2024-12-10 MED ORDER — ESCITALOPRAM OXALATE 20 MG PO TABS: 40.0000 mg | ORAL_TABLET | Freq: Every day | ORAL | 1 refills | Status: AC

## 2024-12-10 NOTE — Addendum Note (Signed)
 Addended by: GLADIS MUSTARD on: 12/10/2024 04:02 PM   Modules accepted: Level of Service

## 2024-12-10 NOTE — Progress Notes (Signed)
 Subjective:    Patient ID: Erica [name], female    DOB: 07/01/1930, 89 y.o.   MRN: [id number]   Chief Complaint: medical management of chronic issues     HPI:  Erica [name] is a 89 y.o. who identifies as a female who was assigned female at birth.   Social history: Lives with: by herself. Her kids check on her daily Work history: retired   Water Engineer in today for follow up of the following chronic medical issues:  1. Primary hypertension No c/o chest pain, sob or headache. Does not check blood pressure at home. BP Readings from Last 3 Encounters:  10/23/24 132/63  07/19/24 (!) 154/56  05/04/24 (!) 170/68     2. Coronary artery disease involving native coronary artery of native heart without angina pectoris Sees Dr. Levern every 6 months. Is doing well. No changes to plan of care since last visit. Is on eliquis  and pacerone  with no issues  3. Gastroparesis Has poor appetite due  to this. Saw GI 10/23/24. They want her to have endoscopy to look at esophagus, but she does not want to do that right now.   4. Gastroesophageal reflux disease without esophagitis Is on nexium  daily and is doing well.  5. Acquired hypothyroidism No issues that she is aware of Lab Results  Component Value Date   TSH 2.840 01/24/2024    6. Mixed hyperlipidemia Has a very poor appetite. Eats whatever she wants to. Does not do any exercise. Is on liptior Lab Results  Component Value Date   CHOL 202 (H) 01/24/2024   HDL 60 01/24/2024   LDLCALC 121 (H) 01/24/2024   TRIG 119 01/24/2024   CHOLHDL 3.4 01/24/2024     7. Anxiety about health Worries about everything    12/10/2024    3:37 PM 01/24/2024    2:49 PM 07/22/2023    3:34 PM 06/22/2023    3:17 PM  GAD 7 : Generalized Anxiety Score  Nervous, Anxious, on Edge 0 0 1 0  Control/stop worrying 0 0 0 0  Worry too much - different things 0 0 1 0  Trouble relaxing 0 0 0 0  Restless 0 0 0 0  Easily annoyed or irritable 0 0 0 0   Afraid - awful might happen 0 0 0 0  Total GAD 7 Score 0 0 2 0  Anxiety Difficulty Not difficult at all Not difficult at all Not difficult at all Not difficult at all       8. Recurrent major depressive disorder, in partial remission (HCC) Is on lexapro  an dis doing well.     12/10/2024    3:37 PM 02/27/2024    2:31 PM 01/24/2024    2:49 PM  Depression screen PHQ 2/9  Decreased Interest 0 0 0  Down, Depressed, Hopeless 0 0 0  PHQ - 2 Score 0 0 0  Altered sleeping 0 0 0  Tired, decreased energy 0 0 0  Change in appetite 0 0 0  Feeling bad or failure about yourself  0 0 0  Trouble concentrating 0 0 0  Moving slowly or fidgety/restless 0 0 0  Suicidal thoughts 0 0 0  PHQ-9 Score 0 0  0   Difficult doing work/chores Not difficult at all Not difficult at all Not difficult at all     Data saved with a previous flowsheet row definition       9. Osteoporosis without current pathological fracture Last dexascan was done on  04/07/21. Patient no longer wants to repeat. She does no weight bearing exercise. Is on fosamax  weekly.  10. BMI 24.0-24.9,adult Weight is unchanged Wt Readings from Last 3 Encounters:  12/10/24 108 lb (49 kg)  10/23/24 110 lb (49.9 kg)  07/19/24 111 lb 3.2 oz (50.4 kg)   BMI Readings from Last 3 Encounters:  12/10/24 20.41 kg/m  10/23/24 20.78 kg/m  07/19/24 21.01 kg/m     New complaints: None today  Allergies  Allergen Reactions   Contrast Media [Iodinated Contrast Media] Shortness Of Breath, Nausea Only and Swelling   Iodine Shortness Of Breath, Nausea Only and Swelling   Iohexol  Hives and Swelling     Desc: hives, swelling over 5 yrs ago-pt has never been pre-medicated--was told to not have iv contrast ever    Ioxaglate Nausea Only, Shortness Of Breath and Swelling   Bactrim  [Sulfamethoxazole -Trimethoprim ] Nausea And Vomiting   Metoclopramide  Other (See Comments)    Mouth tremors, insomnia, and irritability   Outpatient Encounter  Medications as of 12/10/2024  Medication Sig   alendronate  (FOSAMAX ) 70 MG tablet TAKE ONE TABLET ONCE A WEEK ON AN EMPTY STOMACH WITH full GLASS of water **NEEDS TO BE SEEN BEFORE NEXT REFILL**   amiodarone  (PACERONE ) 200 MG tablet Take 1 tablet (200 mg total) by mouth daily. **NEEDS TO BE SEEN BEFORE NEXT REFILL**   amLODipine  (NORVASC ) 5 MG tablet Take 1 tablet (5 mg total) by mouth 2 (two) times daily. **NEEDS TO BE SEEN BEFORE NEXT REFILL**   apixaban  (ELIQUIS ) 2.5 MG TABS tablet Take 1 tablet (2.5 mg total) by mouth 2 (two) times daily. **NEEDS TO BE SEEN BEFORE NEXT REFILL**   atorvastatin  (LIPITOR) 40 MG tablet Take 1 tablet (40 mg total) by mouth daily. **NEEDS TO BE SEEN BEFORE NEXT REFILL**   Cholecalciferol (VITAMIN D3) 125 MCG (5000 UT) CAPS Take 1 capsule by mouth every other day.   escitalopram  (LEXAPRO ) 20 MG tablet Take 2 tablets (40 mg total) by mouth daily. **NEEDS TO BE SEEN BEFORE NEXT REFILL**   esomeprazole  (NEXIUM ) 40 MG capsule TAKE ONE CAPSULE TWICE DAILY BEFORE MEALS **NEEDS TO BE SEEN BEFORE NEXT REFILL**   furosemide  (LASIX ) 20 MG tablet Take 1 tablet (20 mg total) by mouth daily. (Patient taking differently: Take 20 mg by mouth daily. PRN)   hydrocortisone  (ANUSOL -HC) 2.5 % rectal cream Place 1 application rectally 2 (two) times daily. (Patient taking differently: Place 1 application  rectally 2 (two) times daily as needed for hemorrhoids or anal itching.)   levothyroxine  (SYNTHROID ) 88 MCG tablet TAKE ONE TABLET DAILY BEFORE BREAKFAST **NEEDS TO BE SEEN BEFORE NEXT REFILL**   Multiple Vitamin (MULTIVITAMIN WITH MINERALS) TABS tablet Take 1 tablet by mouth daily.   olmesartan  (BENICAR ) 40 MG tablet Take 1 tablet (40 mg total) by mouth daily. **NEEDS TO BE SEEN BEFORE NEXT REFILL**   ondansetron  (ZOFRAN -ODT) 4 MG disintegrating tablet Take 4 mg by mouth every 8 (eight) hours as needed.   Pediatric Multiple Vitamins (FLINTSTONES PLUS EXTRA C) CHEW Chew by mouth.    polyethylene glycol (MIRALAX  / GLYCOLAX ) 17 g packet Take 17 g by mouth daily.   Prucalopride Succinate  (MOTEGRITY ) 2 MG TABS Take 1 tablet (2 mg total) by mouth daily.   RESTASIS 0.05 % ophthalmic emulsion Place 1 drop into both eyes 2 (two) times daily.   sucralfate  (CARAFATE ) 1 g tablet TAKE ONE TABLET FOUR TIMES DAILY BEFORE MEALS AND AT BEDTIME   Wheat Dextrin (BENEFIBER) POWD Take 1 Dose by mouth  daily. Take daily   No facility-administered encounter medications on file as of 12/10/2024.    Past Surgical History:  Procedure Laterality Date   CARDIAC CATHETERIZATION N/A 02/03/2016   Procedure: Coronary/Graft Angiography;  Surgeon: Rober Chroman, MD;  Location: [facility];  Service: Cardiovascular;  Laterality: N/A;   CATARACT EXTRACTION     CHOLECYSTECTOMY     COLONOSCOPY     Multiple, polyps   ESOPHAGEAL MANOMETRY N/A 06/14/2016   Procedure: ESOPHAGEAL MANOMETRY (EM);  Surgeon: Lupita FORBES Commander, MD;  Location: [facility];  Service: Endoscopy;  Laterality: N/A;   LEFT HEART CATHETERIZATION WITH CORONARY ANGIOGRAM N/A 07/17/2013   Procedure: LEFT HEART CATHETERIZATION WITH CORONARY ANGIOGRAM;  Surgeon: Rober LOISE Chroman, MD;  Location: [facility];  Service: Cardiovascular;  Laterality: N/A;   SIGMOIDOSCOPY  08/23/2011   TUBAL LIGATION     UPPER GASTROINTESTINAL ENDOSCOPY  08/23/2011   Normal    Family History  Problem Relation Age of Onset   Stomach cancer Brother    Colon cancer Paternal Aunt    Lung cancer Brother        smoker   Esophageal cancer Father    Stroke Mother       Controlled substance contract: n/a     Review of Systems  Constitutional:  Negative for diaphoresis.  Eyes:  Negative for pain.  Respiratory:  Negative for shortness of breath.   Cardiovascular:  Negative for chest pain, palpitations and leg swelling.  Gastrointestinal:  Negative for abdominal pain.  Endocrine: Negative for polydipsia.  Skin:  Negative for rash.  Neurological:   Negative for dizziness, weakness and headaches.  Hematological:  Does not bruise/bleed easily.  All other systems reviewed and are negative.      Objective:   Physical Exam Vitals and nursing note reviewed.  Constitutional:      General: She is not in acute distress.    Appearance: Normal appearance. She is well-developed.  HENT:     Head: Normocephalic.     Right Ear: Tympanic membrane normal.     Left Ear: Tympanic membrane normal.     Nose: Nose normal.     Mouth/Throat:     Mouth: Mucous membranes are moist.  Eyes:     Pupils: Pupils are equal, round, and reactive to light.  Neck:     Vascular: No carotid bruit or JVD.  Cardiovascular:     Rate and Rhythm: Normal rate and regular rhythm.     Heart sounds: Murmur (1/6 systolic) heard.  Pulmonary:     Effort: Pulmonary effort is normal. No respiratory distress.     Breath sounds: Normal breath sounds. No wheezing or rales.  Chest:     Chest wall: No tenderness.  Abdominal:     General: Bowel sounds are normal. There is no distension or abdominal bruit.     Palpations: Abdomen is soft. There is no hepatomegaly, splenomegaly, mass or pulsatile mass.     Tenderness: There is no abdominal tenderness.  Musculoskeletal:        General: Normal range of motion.     Cervical back: Normal range of motion and neck supple.     Right lower leg: No edema.     Left lower leg: No edema.     Comments: Gait slow but steady  Lymphadenopathy:     Cervical: No cervical adenopathy.  Skin:    General: Skin is warm and dry.  Neurological:     Mental Status: She is alert  and oriented to person, place, and time.     Deep Tendon Reflexes: Reflexes are normal and symmetric.  Psychiatric:        Mood and Affect: Mood normal.        Behavior: Behavior normal.        Thought Content: Thought content normal.        Judgment: Judgment normal.     BP 127/70   Pulse (!) 59   Temp (!) 96 F (35.6 C) (Temporal)   Ht 5' 1 (1.549 m)   Wt 108  lb (49 kg)   SpO2 100%   BMI 20.41 kg/m       Assessment & Plan:  Erica [name] comes in today with chief complaint of medical management of chronic issues    Diagnosis and orders addressed:  1. Primary hypertension Low sodium diet - amLODipine  (NORVASC ) 5 MG tablet; Take 1 tablet (5 mg total) by mouth 2 (two) times daily.  Dispense: 180 tablet; Refill: 1 - furosemide  (LASIX ) 20 MG tablet; Take 1 tablet (20 mg total) by mouth daily.  Dispense: 90 tablet; Refill: 1 - olmesartan  (BENICAR ) 40 MG tablet; Take 1 tablet (40 mg total) by mouth daily.  Dispense: 90 tablet; Refill: 1 - CBC with Differential/Platelet - CMP14+EGFR - Lipid panel  2. Coronary artery disease involving native coronary artery of native heart without angina pectoris Keep follow up with cardiology - amiodarone  (PACERONE ) 200 MG tablet; Take 1 tablet (200 mg total) by mouth daily.  Dispense: 90 tablet; Refill: 1  3. Gastroparesis Eat 6 small meals a day Keep follow up with GI  4. Gastroesophageal reflux disease without esophagitis Avoid spicy foods Do not eat 2 hours prior to bedtime  - esomeprazole  (NEXIUM ) 40 MG capsule; TAKE ONE CAPSULE TWICE DAILY BEFORE MEALS  Dispense: 180 capsule; Refill: 1  5. Acquired hypothyroidism Labs pending - levothyroxine  (SYNTHROID ) 88 MCG tablet; TAKE (1) TABLET DAILY BE- FORE BREAKFAST.  Dispense: 90 tablet; Refill: 1 - Thyroid  Panel With TSH  6. Mixed hyperlipidemia Low fat diet - atorvastatin  (LIPITOR) 10 MG tablet; TAKE ONE TABLET ONCE DAILY AT 6PM  Dispense: 90 tablet; Refill: 0  7. Anxiety about health Stress management  8. Recurrent major depressive disorder, in partial remission (HCC) - escitalopram  (LEXAPRO ) 20 MG tablet; Take 2 tablets (40 mg total) by mouth daily.  Dispense: 180 tablet; Refill: 1  9. BMI 23.0-23.9, adult Do not lose anymore weight   10. Age-related osteoporosis without current pathological fracture Weight bearing exercises when can  tolerate - alendronate  (FOSAMAX ) 70 MG tablet; TAKE 1 TABLET ONCE A WEEK ON AN EMPTY STOMACH WITH A FULL GLASS OF WATER  Dispense: 12 tablet; Refill: 3  11. Mild ear effusion right Flonase  nasal spray daily  Labs pending Health Maintenance reviewed Diet and exercise encouraged  Follow up plan: 6 months   Mary-Margaret Gladis, [name]

## 2024-12-10 NOTE — Patient Instructions (Signed)
 GERD in Adults: Diet Changes When you have gastroesophageal reflux disease (GERD), you may need to make changes to your diet. Choosing the right foods can help with your symptoms. Think about working with an expert in healthy eating called a dietitian. They can help you make healthy food choices. What are tips for following this plan? Reading food labels Look for foods that are low in saturated fat. Foods that may help with your symptoms include: Foods with less than 5% of daily value (DV) of fat. Foods with 0 grams of trans fat. Cooking [employer] in ways that don't use a lot of fat. These ways include: Baking. Steaming. Grilling. Broiling. To add flavor, try to use herbs that are low in spice and acidity. Avoid frying your food. Meal planning  Eat small meals often rather than eating 3 large meals each day. Eat your meals slowly in a place where you feel relaxed. If told by your health care provider, avoid: Foods that cause symptoms. Keep a food diary to keep track of foods that cause symptoms. Alcohol. Drinking a lot of liquid with meals. General instructions For 2-3 hours after you eat, avoid: Bending over. Exercise. Lying down. Chew sugar-free gum after meals. What foods should I eat? Eat a healthy diet. Try to include: Foods with high amounts of fiber. These include: Fruits and vegetables. Whole grains and beans. Low-fat dairy products. Lean meats, fish, and poultry. Egg whites. Foods that cause symptoms in someone else may not cause symptoms for you. Work with your provider to find foods that are safe for you. The items listed above may not be all the foods and drinks you can have. Talk with a dietitian to learn more. The items listed above may not be a complete list of foods and beverages you can eat and drink. Contact a dietitian for more information. What foods should I avoid? Limiting some of these foods may help with your symptoms. Each person is different.  Talk with a dietitian or your provider to help you find the exact foods to avoid. Some of the foods to avoid may include: Fruits Fruits with a lot of acid in them. These may include citrus fruits, such as oranges, grapefruit, pineapple, and lemons. Vegetables Deep-fried vegetables, such as [place] fries. Vegetables, sauces, or toppings made with added fat and vegetables with acid in them. These may include tomatoes and tomato products, chili peppers, onions, garlic, and horseradish. Grains Pastries or quick breads with added fat. Meats and other proteins High-fat meats, such as fatty beef or pork, hot dogs, ribs, ham, sausage, salami, and bacon. Fried meat or protein, such as fried fish and fried chicken. Egg yolks. Fats and oils Butter. Margarine. Shortening. Ghee. Drinks Coffee and other drinks with caffeine in them. Fizzy and sugary drinks, such as soda and energy drinks. Fruit juice made with acidic fruits, such as orange or grapefruit. Tomato juice. Sweets and desserts Chocolate and cocoa. Donuts. Seasonings and condiments Mint, such as peppermint and spearmint. Condiments, herbs, or seasonings that cause symptoms. These may include curry, hot sauce, or vinegar-based salad dressings. The items listed above may not be all the foods and drinks you should avoid. Talk with a dietitian to learn more. Questions to ask your health care provider Changes to your diet and everyday life are often the first steps taken to manage symptoms of GERD. If these changes don't help, talk with your provider about taking medicines. Where to find more information [employer] Gastrointestinal [employer]:  aboutgerd.org This information is not intended to replace advice given to you by your health care provider. Make sure you discuss any questions you have with your health care provider. Document Revised: 10/25/2023 Document Reviewed: 05/11/2023 Elsevier Patient Education  2024 [employer].

## 2024-12-11 ENCOUNTER — Ambulatory Visit: Payer: Self-pay | Admitting: Nurse Practitioner

## 2024-12-11 DIAGNOSIS — Z23 Encounter for immunization: Secondary | ICD-10-CM | POA: Diagnosis not present

## 2024-12-11 DIAGNOSIS — D696 Thrombocytopenia, unspecified: Secondary | ICD-10-CM

## 2024-12-11 LAB — CMP14+EGFR
ALT: 74 IU/L — ABNORMAL HIGH (ref 0–32)
AST: 71 IU/L — ABNORMAL HIGH (ref 0–40)
Albumin: 3.5 g/dL — ABNORMAL LOW (ref 3.6–4.6)
Alkaline Phosphatase: 74 IU/L (ref 48–129)
BUN/Creatinine Ratio: 21 (ref 12–28)
BUN: 21 mg/dL (ref 10–36)
Bilirubin Total: 0.6 mg/dL (ref 0.0–1.2)
CO2: 25 mmol/L (ref 20–29)
Calcium: 8.7 mg/dL (ref 8.7–10.3)
Chloride: 101 mmol/L (ref 96–106)
Creatinine, Ser: 0.98 mg/dL (ref 0.57–1.00)
Globulin, Total: 2.5 g/dL (ref 1.5–4.5)
Glucose: 98 mg/dL (ref 70–99)
Potassium: 3.9 mmol/L (ref 3.5–5.2)
Sodium: 138 mmol/L (ref 134–144)
Total Protein: 6 g/dL (ref 6.0–8.5)
eGFR: 54 mL/min/1.73 — ABNORMAL LOW (ref >59)

## 2024-12-11 LAB — CBC WITH DIFFERENTIAL/PLATELET
Basophils Absolute: 0 x10E3/uL (ref 0.0–0.2)
Basos: 0 %
EOS (ABSOLUTE): 0.1 x10E3/uL (ref 0.0–0.4)
Eos: 2 %
Hematocrit: 31.5 % — ABNORMAL LOW (ref 34.0–46.6)
Hemoglobin: 10.7 g/dL — ABNORMAL LOW (ref 11.1–15.9)
Immature Grans (Abs): 0 x10E3/uL (ref 0.0–0.1)
Immature Granulocytes: 0 %
Lymphocytes Absolute: 1.9 x10E3/uL (ref 0.7–3.1)
Lymphs: 65 %
MCH: 36.1 pg — ABNORMAL HIGH (ref 26.6–33.0)
MCHC: 34 g/dL (ref 31.5–35.7)
MCV: 106 fL — ABNORMAL HIGH (ref 79–97)
Monocytes Absolute: 0.2 x10E3/uL (ref 0.1–0.9)
Monocytes: 8 %
Neutrophils Absolute: 0.7 x10E3/uL — ABNORMAL LOW (ref 1.4–7.0)
Neutrophils: 25 %
Platelets: 139 x10E3/uL — ABNORMAL LOW (ref 150–450)
RBC: 2.96 x10E6/uL — ABNORMAL LOW (ref 3.77–5.28)
RDW: 14.2 % (ref 11.7–15.4)
WBC: 2.9 x10E3/uL — ABNORMAL LOW (ref 3.4–10.8)

## 2024-12-11 LAB — THYROID PANEL WITH TSH
Free Thyroxine Index: 4.2 (ref 1.2–4.9)
T3 Uptake Ratio: 35 % (ref 24–39)
T4, Total: 12 ug/dL (ref 4.5–12.0)
TSH: 1.01 u[IU]/mL (ref 0.450–4.500)

## 2024-12-11 LAB — LIPID PANEL
Chol/HDL Ratio: 2.8 ratio (ref 0.0–4.4)
Cholesterol, Total: 132 mg/dL (ref 100–199)
HDL: 48 mg/dL (ref >39)
LDL Chol Calc (NIH): 69 mg/dL (ref 0–99)
Triglycerides: 74 mg/dL (ref 0–149)
VLDL Cholesterol Cal: 15 mg/dL (ref 5–40)

## 2024-12-27 ENCOUNTER — Encounter: Payer: Self-pay | Admitting: Gastroenterology

## 2025-01-23 ENCOUNTER — Inpatient Hospital Stay: Admitting: Oncology

## 2025-01-23 ENCOUNTER — Inpatient Hospital Stay

## 2025-01-25 ENCOUNTER — Inpatient Hospital Stay: Admitting: Hematology

## 2025-01-25 ENCOUNTER — Ambulatory Visit: Admitting: Nurse Practitioner

## 2025-01-25 ENCOUNTER — Inpatient Hospital Stay

## 2025-01-29 ENCOUNTER — Ambulatory Visit: Payer: Self-pay

## 2025-01-29 ENCOUNTER — Encounter (HOSPITAL_COMMUNITY): Payer: Self-pay | Admitting: *Deleted

## 2025-01-29 ENCOUNTER — Inpatient Hospital Stay (HOSPITAL_COMMUNITY)
Admission: EM | Admit: 2025-01-29 | Discharge: 2025-02-01 | Disposition: A | Source: Home / Self Care | Attending: Internal Medicine | Admitting: Internal Medicine

## 2025-01-29 ENCOUNTER — Other Ambulatory Visit: Payer: Self-pay

## 2025-01-29 ENCOUNTER — Telehealth: Payer: Self-pay | Admitting: Nurse Practitioner

## 2025-01-29 ENCOUNTER — Emergency Department (HOSPITAL_COMMUNITY)

## 2025-01-29 DIAGNOSIS — K3184 Gastroparesis: Secondary | ICD-10-CM | POA: Diagnosis present

## 2025-01-29 DIAGNOSIS — E039 Hypothyroidism, unspecified: Secondary | ICD-10-CM | POA: Diagnosis present

## 2025-01-29 DIAGNOSIS — J181 Lobar pneumonia, unspecified organism: Secondary | ICD-10-CM

## 2025-01-29 DIAGNOSIS — D696 Thrombocytopenia, unspecified: Secondary | ICD-10-CM | POA: Diagnosis not present

## 2025-01-29 DIAGNOSIS — I10 Essential (primary) hypertension: Secondary | ICD-10-CM | POA: Diagnosis present

## 2025-01-29 DIAGNOSIS — J189 Pneumonia, unspecified organism: Secondary | ICD-10-CM | POA: Diagnosis not present

## 2025-01-29 DIAGNOSIS — D72829 Elevated white blood cell count, unspecified: Secondary | ICD-10-CM | POA: Diagnosis not present

## 2025-01-29 DIAGNOSIS — J9601 Acute respiratory failure with hypoxia: Secondary | ICD-10-CM

## 2025-01-29 LAB — PROTIME-INR
INR: 1.5 — ABNORMAL HIGH (ref 0.8–1.2)
Prothrombin Time: 18.5 s — ABNORMAL HIGH (ref 11.4–15.2)

## 2025-01-29 LAB — CBC
HCT: 25.8 % — ABNORMAL LOW (ref 36.0–46.0)
Hemoglobin: 8.3 g/dL — ABNORMAL LOW (ref 12.0–15.0)
MCH: 35.9 pg — ABNORMAL HIGH (ref 26.0–34.0)
MCHC: 32.2 g/dL (ref 30.0–36.0)
MCV: 111.7 fL — ABNORMAL HIGH (ref 80.0–100.0)
Platelets: 14 10*3/uL — CL (ref 150–400)
RBC: 2.31 MIL/uL — ABNORMAL LOW (ref 3.87–5.11)
RDW: 18.2 % — ABNORMAL HIGH (ref 11.5–15.5)
WBC: 157.2 10*3/uL (ref 4.0–10.5)
nRBC: 0.2 % (ref 0.0–0.2)

## 2025-01-29 LAB — DIFFERENTIAL
Basophils Absolute: 0 10*3/uL (ref 0.0–0.1)
Basophils Relative: 0 %
Blasts: 96 %
Eosinophils Absolute: 0 10*3/uL (ref 0.0–0.5)
Eosinophils Relative: 0 %
Lymphocytes Relative: 1 %
Lymphs Abs: 1.6 10*3/uL (ref 0.7–4.0)
Monocytes Absolute: 3.1 10*3/uL — ABNORMAL HIGH (ref 0.1–1.0)
Monocytes Relative: 2 %
Neutro Abs: 1.6 10*3/uL — ABNORMAL LOW (ref 1.7–7.7)
Neutrophils Relative %: 1 %

## 2025-01-29 LAB — COMPREHENSIVE METABOLIC PANEL WITH GFR
ALT: 54 U/L — ABNORMAL HIGH (ref 0–44)
AST: 44 U/L — ABNORMAL HIGH (ref 15–41)
Albumin: 3.3 g/dL — ABNORMAL LOW (ref 3.5–5.0)
Alkaline Phosphatase: 86 U/L (ref 38–126)
Anion gap: 13 (ref 5–15)
BUN: 26 mg/dL — ABNORMAL HIGH (ref 8–23)
CO2: 20 mmol/L — ABNORMAL LOW (ref 22–32)
Calcium: 8.9 mg/dL (ref 8.9–10.3)
Chloride: 101 mmol/L (ref 98–111)
Creatinine, Ser: 0.85 mg/dL (ref 0.44–1.00)
GFR, Estimated: >60 mL/min
Glucose, Bld: 134 mg/dL — ABNORMAL HIGH (ref 70–99)
Potassium: 4.4 mmol/L (ref 3.5–5.1)
Sodium: 134 mmol/L — ABNORMAL LOW (ref 135–145)
Total Bilirubin: 0.5 mg/dL (ref 0.0–1.2)
Total Protein: 6.3 g/dL — ABNORMAL LOW (ref 6.5–8.1)

## 2025-01-29 LAB — RESP PANEL BY RT-PCR (RSV, FLU A&B, COVID)  RVPGX2
Influenza A by PCR: NEGATIVE
Influenza B by PCR: NEGATIVE
Resp Syncytial Virus by PCR: NEGATIVE
SARS Coronavirus 2 by RT PCR: NEGATIVE

## 2025-01-29 LAB — URIC ACID: Uric Acid, Serum: 2.6 mg/dL (ref 2.5–7.1)

## 2025-01-29 LAB — POC OCCULT BLOOD, ED: Fecal Occult Bld: POSITIVE — AB

## 2025-01-29 LAB — TECHNOLOGIST SMEAR REVIEW: Plt Morphology: NORMAL

## 2025-01-29 LAB — LIPASE, BLOOD: Lipase: 11 U/L (ref 11–51)

## 2025-01-29 LAB — PHOSPHORUS: Phosphorus: 3 mg/dL (ref 2.5–4.6)

## 2025-01-29 LAB — LACTATE DEHYDROGENASE: LDH: 343 U/L — ABNORMAL HIGH (ref 105–235)

## 2025-01-29 LAB — ABO/RH: ABO/RH(D): AB POS

## 2025-01-29 LAB — MAGNESIUM: Magnesium: 2.4 mg/dL (ref 1.7–2.4)

## 2025-01-29 LAB — APTT: aPTT: 34 s (ref 24–36)

## 2025-01-29 MED ORDER — SODIUM CHLORIDE 0.9% IV SOLUTION: Freq: Once | INTRAVENOUS | Status: DC

## 2025-01-29 MED ORDER — ACETAMINOPHEN 325 MG PO TABS
650.0000 mg | ORAL_TABLET | Freq: Four times a day (QID) | ORAL | Status: DC | PRN
  Administered 2025-01-31 – 2025-02-01 (×3): 650 mg via ORAL
  Filled 2025-01-29 (×3): qty 2

## 2025-01-29 MED ORDER — AMLODIPINE BESYLATE 5 MG PO TABS
5.0000 mg | ORAL_TABLET | Freq: Two times a day (BID) | ORAL | Status: DC
  Administered 2025-01-30: 5 mg via ORAL
  Filled 2025-01-29: qty 1

## 2025-01-29 MED ORDER — POLYETHYLENE GLYCOL 3350 17 G PO PACK: 17.0000 g | PACK | Freq: Every day | ORAL | Status: DC | PRN

## 2025-01-29 MED ORDER — SODIUM CHLORIDE 0.9 % IV SOLN
100.0000 mg | Freq: Two times a day (BID) | INTRAVENOUS | Status: DC
  Administered 2025-01-29 – 2025-01-31 (×4): 100 mg via INTRAVENOUS
  Filled 2025-01-29 (×8): qty 100

## 2025-01-29 MED ORDER — CHLORHEXIDINE GLUCONATE CLOTH 2 % EX PADS
6.0000 | MEDICATED_PAD | Freq: Every day | CUTANEOUS | Status: DC
  Administered 2025-01-29 – 2025-02-01 (×4): 6 via TOPICAL

## 2025-01-29 MED ORDER — IRBESARTAN 150 MG PO TABS
300.0000 mg | ORAL_TABLET | Freq: Every day | ORAL | Status: DC
  Administered 2025-01-30 – 2025-01-31 (×2): 300 mg via ORAL
  Filled 2025-01-29 (×2): qty 2

## 2025-01-29 MED ORDER — SODIUM CHLORIDE 0.9 % IV SOLN
2.0000 g | INTRAVENOUS | Status: DC
  Administered 2025-01-29 – 2025-01-31 (×3): 2 g via INTRAVENOUS
  Filled 2025-01-29 (×3): qty 20

## 2025-01-29 MED ORDER — TRAZODONE HCL 50 MG PO TABS
50.0000 mg | ORAL_TABLET | Freq: Every evening | ORAL | Status: DC | PRN
  Administered 2025-01-29 – 2025-01-30 (×2): 50 mg via ORAL
  Filled 2025-01-29 (×2): qty 1

## 2025-01-29 MED ORDER — ONDANSETRON HCL 4 MG PO TABS: 4.0000 mg | ORAL_TABLET | Freq: Four times a day (QID) | ORAL | Status: DC | PRN

## 2025-01-29 MED ORDER — ACETAMINOPHEN 650 MG RE SUPP: 650.0000 mg | Freq: Four times a day (QID) | RECTAL | Status: DC | PRN

## 2025-01-29 MED ORDER — ALLOPURINOL 100 MG PO TABS
50.0000 mg | ORAL_TABLET | Freq: Every day | ORAL | Status: DC
  Administered 2025-01-29 – 2025-02-01 (×4): 50 mg via ORAL
  Filled 2025-01-29 (×4): qty 1

## 2025-01-29 MED ORDER — LEVOTHYROXINE SODIUM 88 MCG PO TABS
88.0000 ug | ORAL_TABLET | Freq: Every day | ORAL | Status: DC
  Administered 2025-01-30 – 2025-02-01 (×3): 88 ug via ORAL
  Filled 2025-01-29 (×3): qty 1

## 2025-01-29 MED ORDER — ONDANSETRON HCL 4 MG/2ML IJ SOLN: 4.0000 mg | Freq: Four times a day (QID) | INTRAMUSCULAR | Status: DC | PRN

## 2025-01-29 MED ORDER — ONDANSETRON HCL 4 MG/2ML IJ SOLN
4.0000 mg | Freq: Once | INTRAMUSCULAR | Status: AC
  Administered 2025-01-29: 4 mg via INTRAVENOUS
  Filled 2025-01-29: qty 2

## 2025-01-29 NOTE — ED Triage Notes (Signed)
 Patient BIB [facility] from home for complaint  of weakness and nausea for 1 week. EMS vitals, 134/68, 67, 100% room air, Temp 98.9 oral. Alert and oriented x4. Was sopposed to be seen to get blood work done today by PCP.

## 2025-01-29 NOTE — ED Notes (Signed)
"  Pt refused gown.   "

## 2025-01-29 NOTE — Telephone Encounter (Unsigned)
 Copied from CRM [id number]. Topic: Clinical - Refused Triage >> Jan 29, 2025  8:45 AM Tiffini S wrote: Patient called back asking for nurse Rollene- states that she is not feeling well. Was able to get symptoms- pt stated coughing, sneezing, think there is a sinus infection- have been taking nasal spray. Offered to connect call to NT but patient hung up  Outbound call to the patient- explained that office will open at 10am today- NT are available to assist now/ patient refused stating that she will go to the hospital instead- patient ended the call again while attempting to connect the call back to NT.   Patient is hard to understand- probing questions was needed.   CB: [phone] or [phone]

## 2025-01-29 NOTE — ED Notes (Signed)
 Platelets ready but waiting for pt to talk to Dr about consent form.

## 2025-01-29 NOTE — Telephone Encounter (Signed)
 See Telephone Note

## 2025-01-29 NOTE — ED Provider Notes (Signed)
" °  Physical Exam  BP (!) 155/56   Pulse 68   Temp 98.7 F (37.1 C) (Oral)   Resp 16   Ht 5' 1 (1.549 m)   Wt 49 kg   SpO2 90%   BMI 20.41 kg/m   Physical Exam  Procedures  Procedures  ED Course / MDM   Clinical Course as of 01/29/25 1559  Tue Jan 29, 2025  1414 C/f acute leukemia or other myelodysplastic disorder [HN]  1414 Calcium : 8.9 No hypocalcemia, no hyperkalemia, overall low c/f TLS, awaiting phos and uric acid. [HN]    Clinical Course User Index [HN] Franklyn Sid SAILOR, MD   Medical Decision Making Amount and/or Complexity of Data Reviewed Labs: ordered. Decision-making details documented in ED Course. Radiology: ordered.  Risk Prescription drug management. Decision regarding hospitalization.  Patient is a blast crisis.  She has no history of cancer.  She is getting platelets, Dr. Davonna will see and determine if patient can stay here versus go to [facility].  He is amenable to staying here overnight, discussed with Dr. Pearlean for admission        Suellen Sherran LABOR, [place] 01/29/25 1810  "

## 2025-01-29 NOTE — Consult Note (Signed)
 [facility] Consultation Hematology/Oncology  CONSULTING PHYSICIAN:  Lonni Conger, [place]  REASON FOR CONSULT:  Leukocytosis    HISTORY OF PRESENT ILLNESS:   Erica [name] is a 89 y.o. female with past medical history of hypertension, CAD, gastroparesis, GERD, acquired hypothyroidism who presented to the ER for generalized not feeling well.  Patient has been of good health until almost 1 year ago, since when she started losing weight and has lost 15 pounds before.  For the last few days, patient has been feeling extremely tired, unable to do her ADLs and also reported nausea.  Patient's fecal occult blood testing came back positive and CBC showed leukocytosis of 157 with 96% blasts, hemoglobin of 8.3 and platelets of 14.  Hematology consulted for leukocytosis.     MEDICATIONS: I have reviewed the patient's current medications.       PERFORMANCE STATUS: The patient's performance status is 2 - Symptomatic, <50% confined to bed  PHYSICAL EXAM: Most Recent Vital Signs: Blood pressure (!) 134/103, pulse 92, temperature 97.8 F (36.6 C), temperature source Oral, resp. rate 12, height 6' (1.829 m), weight 270 lb (122.5 kg), SpO2 91%.  GENERAL: Frail female in no acute distress SKIN: skin color, texture, turgor are normal, no rashes or significant lesions EYES: normal, conjunctiva are pink and non-injected, sclera clear OROPHARYNX:no exudate, no erythema and lips, buccal mucosa, and tongue normal  NECK: supple, thyroid  normal size, non-tender, without nodularity LYMPH:  no palpable lymphadenopathy in the cervical, axillary or inguinal LUNGS: clear to auscultation and percussion with normal breathing effort HEART: regular rate & rhythm and no murmurs and no lower extremity edema ABDOMEN:abdomen soft, non-tender and normal bowel sounds Musculoskeletal:no cyanosis of digits and no clubbing  PSYCH: alert & oriented x 3 with fluent speech    LABORATORY DATA:   Last CBC Lab  Results  Component Value Date   WBC 157.2 (HH) 01/29/2025   HGB 8.3 (L) 01/29/2025   HCT 25.8 (L) 01/29/2025   MCV 111.7 (H) 01/29/2025   MCH 35.9 (H) 01/29/2025   RDW 18.2 (H) 01/29/2025   PLT 14 (LL) 01/29/2025     Last metabolic panel Lab Results  Component Value Date   GLUCOSE 134 (H) 01/29/2025   NA 134 (L) 01/29/2025   K 4.4 01/29/2025   CL 101 01/29/2025   CO2 20 (L) 01/29/2025   BUN 26 (H) 01/29/2025   CREATININE 0.85 01/29/2025   GFRNONAA >60 01/29/2025   CALCIUM  8.9 01/29/2025   PHOS 3.0 01/29/2025   PROT 6.3 (L) 01/29/2025   ALBUMIN 3.3 (L) 01/29/2025   LABGLOB 2.5 12/10/2024   AGRATIO 2.0 01/24/2023   BILITOT 0.5 01/29/2025   ALKPHOS 86 01/29/2025   AST 44 (H) 01/29/2025   ALT 54 (H) 01/29/2025   ANIONGAP 13 01/29/2025      RADIOGRAPHY:  CT Soft Tissue Neck Wo Contrast EXAM: CT NECK WITHOUT CONTRAST 01/29/2025 04:06:28 PM  TECHNIQUE: CT of the neck was performed without the administration of intravenous contrast. Multiplanar reformatted images are provided for review. Automated exposure control, iterative reconstruction, and/or weight based adjustment of the mA/kV was utilized to reduce the radiation dose to as low as reasonably achievable.  COMPARISON: None available.  CLINICAL HISTORY: Swelling right side of neck, leukocytosis. Swelling on the right side of the neck; leukocytosis.  FINDINGS:  AERODIGESTIVE TRACT: The nasopharynx is symmetric. Symmetric appearance of the oropharynx and tonsils. Normal appearance of the oral cavity and floor of mouth. Normal appearance of the epiglottis. The  retropharynx is unremarkable. Symmetric appearance of the supraglottic airway. The vocal folds are symmetric. No discrete mass. No edema.  SALIVARY GLANDS: The parotid and submandibular glands are unremarkable.  THYROID : Unremarkable.  LYMPH NODES: No suspicious cervical lymphadenopathy. No lymphadenopathy appreciated.  SOFT TISSUES: There is  no focal inflammatory change visualized within the soft tissues in the right aspect of the neck. No asymmetric soft tissue swelling or fluid collection appreciated. There is atherosclerosis of the right carotid bifurcation likely resulting in at least mild stenosis although evaluation is limited by lack of IV contrast. No mass.  BONES: Degenerative changes in the visualized spine with disc space narrowing greatest at C4-C5. Edentulous maxilla. Degenerative changes of the left temporomandibular joint.  OTHER: Visualized sinuses and mastoid air cells are well aerated. Partially visualized left pleural effusion. There is atherosclerosis noted along the aortic arch. There are patchy opacities in the left upper lobe better characterized on the same day CT chest.  IMPRESSION: 1. No acute findings in the right side of the neck related to the reported swelling and leukocytosis. 2. Partially visualized left pleural effusion and patchy left upper lobe opacities, better characterized on the same day CT chest.  Electronically signed by: Donnice Mania MD 01/29/2025 04:24 PM EST RP Workstation: [id number] CT CHEST ABDOMEN PELVIS WO CONTRAST CLINICAL DATA:  Leukocytosis, dyspnea, weakness and nausea for 1 week  EXAM: CT CHEST, ABDOMEN AND PELVIS WITHOUT CONTRAST  TECHNIQUE: Multidetector CT imaging of the chest, abdomen and pelvis was performed following the standard protocol without IV contrast. Examination was performed without contrast at the request of the ordering clinician. Assessment of the vascular structures, soft tissues, and solid viscera is severely limited.  RADIATION DOSE REDUCTION: This exam was performed according to the departmental dose-optimization program which includes automated exposure control, adjustment of the mA and/or kV according to patient size and/or use of iterative reconstruction technique.  COMPARISON:  08/08/2024  FINDINGS: CT CHEST  FINDINGS  Cardiovascular: Unenhanced imaging of the heart demonstrates borderline cardiomegaly. Decreased attenuation within the cardiac chambers may signify underlying anemia. Calcifications of the mitral and aortic valves. Normal caliber of the thoracic aorta. Atherosclerosis of the aorta and coronary vasculature. Assessment of the vascular lumen cannot be performed without intravenous contrast.  Mediastinum/Nodes: No enlarged mediastinal, hilar, or axillary lymph nodes. Thyroid  gland, trachea, and esophagus demonstrate no significant findings.  Lungs/Pleura: There are small bilateral pleural effusions, left greater than right. Bilateral lower lobe atelectasis, greatest within the left lower lobe. There is patchy left upper lobe airspace disease concerning for bronchopneumonia. No pneumothorax. The central airways are patent.  Musculoskeletal: No acute or destructive bony abnormalities. Reconstructed images demonstrate no additional findings.  CT ABDOMEN PELVIS FINDINGS  Hepatobiliary: Increased liver attenuation compatible with chronic amiodarone  therapy. Prior cholecystectomy. No biliary duct dilation.  Pancreas: Unremarkable unenhanced appearance. Evaluation limited by respiratory motion.  Spleen: Unremarkable unenhanced appearance.  Adrenals/Urinary Tract: No urinary tract calculi or obstructive uropathy within either kidney. The adrenals and bladder are unremarkable.  Stomach/Bowel: No bowel obstruction or ileus. Normal appendix right lower quadrant. Distal colonic diverticulosis without diverticulitis. No bowel wall thickening or inflammatory change. Moderate stool throughout the distal colon.  Vascular/Lymphatic: Aortic atherosclerosis. A central mesenteric nodule containing coarse central calcification measures 1.5 by 1.4 cm reference image 81/2, stable since prior study. This may reflect a calcified mesenteric lymph node. No other pathologic adenopathy  is identified.  Reproductive: Uterus and bilateral adnexa are unremarkable.  Other: Trace pelvic free fluid. No free intraperitoneal gas.  No abdominal wall hernia.  Musculoskeletal: No acute or destructive bony abnormalities. Reconstructed images demonstrate no additional findings.  IMPRESSION: 1. Patchy left upper lobe airspace disease consistent with bronchopneumonia. 2. Small bilateral pleural effusions, left greater than right. 3. Cardiomegaly. Decreased attenuation within the cardiac chambers consistent with anemia. 4. Stable 1.5 cm mesenteric nodule with central coarse calcification, likely calcified lymph node. No other pathologic adenopathy. 5. Distal colonic diverticulosis without diverticulitis. 6.  Aortic Atherosclerosis (ICD10-I70.0).  Electronically Signed   By: Ozell Daring M.D.   On: 01/29/2025 16:21        ASSESSMENT: Patient is a 89 year old female with multiple comorbidities now presenting with generalized weakness and nausea.  CBC concerning for acute leukemia with WBC of 157, 96% blasts.  Platelets: 14 TLS labs: No hypocalcemia, normal creatinine, normal potassium and phosphorus.  LDH is elevated at 343.  Flow cytometry is pending at this time. Peripheral smear reviewed: WBC: Many blasts present mostly consistent with differential, significant left shift with multiple immature cells.  No Auer rods seen.  Significantly low platelets.  Normal-looking RBCs with no schistocytes. Other labs: PT: 18.5, INR: 1.5, APTT: 34.  PLAN:   1.  Leukocytosis Significant concern for acute leukemia in the setting of leukocytosis with significantly increased blasts and immature cells. Labs are less concerning for TLS at this time. Flow cytometry pending.  - Discussed with the patient that her labs are highly concerning for acute leukemia and confirmatory diagnosis would be made with a bone marrow biopsy.  Flow cytometry can give a preliminary result. - Discussed overall  poor prognosis with the median overall survival of approximately 1.3 to 6.2 months depending on leukemia type, treatment approach and molecular features. - Overall, patient has very poor functional status today and I worry that she is a chemotherapy candidate at this time. - Discussed that any type of leukemia would require chemotherapy as treatment and that even with chemotherapy the median survival ranges anywhere from 3 months to 12 months depending on the type of leukemia and the treatment approach. - Discussed the above poor prognosis in detail with the patient and that if she does not want to do chemotherapy, she can be referred to hospice for palliative management. - Also discussed all the above information with daughter and granddaughter at the time.  They would like to think at this time and come up with a decision tomorrow. - Also discussed that if patient decides to go ahead with treatment, I this would require inpatient transfer to [facility] for inpatient chemotherapy and workup. - Will follow-up to flow cytometry result.  The smear was also submitted for pathology smear review. - Please trend TLS labs twice daily - Please start allopurinol  300 mg daily - Transfusion support for hemoglobin of less than 7 and platelets less than 30 considering patient has GI bleed.  If bleeding stops, can transfuse for platelets less than 20. - Please consult palliative team   Thank you for involving us  in this patient's care.  Hematology oncology will continue to follow with the patient.  Please to reach out with any questions or concerns.  Mickiel Dry, MD Hematology/Oncology [facility]

## 2025-01-29 NOTE — Telephone Encounter (Signed)
 Attempted to contact patient x 1 to discuss symptoms; no answer. Will attempt to contact patient at a later time to further discuss concerns.       Message from Avram MATSU sent at 01/29/2025  8:22 AM EST  Reason for Triage: patient is calling asking to speak with a nurse or her provider. she stated she has not been feeling well. I tried to find out what symptoms she's having but was not able to get an answer.

## 2025-01-29 NOTE — ED Notes (Signed)
 Nurse called CCMD.

## 2025-01-30 ENCOUNTER — Encounter (HOSPITAL_COMMUNITY): Payer: Self-pay | Admitting: Internal Medicine

## 2025-01-30 ENCOUNTER — Inpatient Hospital Stay (HOSPITAL_COMMUNITY)

## 2025-01-30 DIAGNOSIS — I5031 Acute diastolic (congestive) heart failure: Secondary | ICD-10-CM | POA: Diagnosis not present

## 2025-01-30 DIAGNOSIS — D696 Thrombocytopenia, unspecified: Secondary | ICD-10-CM | POA: Diagnosis not present

## 2025-01-30 DIAGNOSIS — J9601 Acute respiratory failure with hypoxia: Secondary | ICD-10-CM

## 2025-01-30 DIAGNOSIS — J181 Lobar pneumonia, unspecified organism: Secondary | ICD-10-CM

## 2025-01-30 LAB — URIC ACID
Uric Acid, Serum: 2.1 mg/dL — ABNORMAL LOW (ref 2.5–7.1)
Uric Acid, Serum: 2.1 mg/dL — ABNORMAL LOW (ref 2.5–7.1)
Uric Acid, Serum: 1.9 mg/dL — ABNORMAL LOW (ref 2.5–7.1)

## 2025-01-30 LAB — URINALYSIS, W/ REFLEX TO CULTURE (INFECTION SUSPECTED)
Bacteria, UA: NONE SEEN
Bilirubin Urine: NEGATIVE
Glucose, UA: NEGATIVE mg/dL
Ketones, ur: 5 mg/dL — AB
Leukocytes,Ua: NEGATIVE
Nitrite: NEGATIVE
Protein, ur: 30 mg/dL — AB
Specific Gravity, Urine: 1.02 (ref 1.005–1.030)
pH: 6 (ref 5.0–8.0)

## 2025-01-30 LAB — BPAM PLATELET PHERESIS
Blood Product Expiration Date: 202602042359
ISSUE DATE / TIME: 202602031627
Unit Type and Rh: 7300

## 2025-01-30 LAB — ECHOCARDIOGRAM COMPLETE
AR max vel: 1.64 cm2
AV Area VTI: 1.83 cm2
AV Area mean vel: 1.58 cm2
AV Mean grad: 9 mmHg
AV Peak grad: 15.9 mmHg
Ao pk vel: 2 m/s
Area-P 1/2: 2.77 cm2
Calc EF: 66.7 %
Height: 61 in
S' Lateral: 2.8 cm
Single Plane A2C EF: 72.9 %
Single Plane A4C EF: 58.9 %
Weight: 1806.01 [oz_av]

## 2025-01-30 LAB — BASIC METABOLIC PANEL WITH GFR
Anion gap: 13 (ref 5–15)
BUN: 21 mg/dL (ref 8–23)
CO2: 20 mmol/L — ABNORMAL LOW (ref 22–32)
Calcium: 8 mg/dL — ABNORMAL LOW (ref 8.9–10.3)
Chloride: 102 mmol/L (ref 98–111)
Creatinine, Ser: 0.83 mg/dL (ref 0.44–1.00)
GFR, Estimated: >60 mL/min
Glucose, Bld: 125 mg/dL — ABNORMAL HIGH (ref 70–99)
Potassium: 3.6 mmol/L (ref 3.5–5.1)
Sodium: 135 mmol/L (ref 135–145)

## 2025-01-30 LAB — CBC
HCT: 22.9 % — ABNORMAL LOW (ref 36.0–46.0)
Hemoglobin: 7.4 g/dL — ABNORMAL LOW (ref 12.0–15.0)
MCH: 36.3 pg — ABNORMAL HIGH (ref 26.0–34.0)
MCHC: 32.3 g/dL (ref 30.0–36.0)
MCV: 112.3 fL — ABNORMAL HIGH (ref 80.0–100.0)
Platelets: 35 10*3/uL — ABNORMAL LOW (ref 150–400)
RBC: 2.04 MIL/uL — ABNORMAL LOW (ref 3.87–5.11)
RDW: 18.2 % — ABNORMAL HIGH (ref 11.5–15.5)
WBC: 144.8 10*3/uL (ref 4.0–10.5)
nRBC: 0.2 % (ref 0.0–0.2)

## 2025-01-30 LAB — PRO BRAIN NATRIURETIC PEPTIDE: Pro Brain Natriuretic Peptide: 5099 pg/mL — ABNORMAL HIGH

## 2025-01-30 LAB — LACTATE DEHYDROGENASE
LDH: 349 U/L — ABNORMAL HIGH (ref 105–235)
LDH: 362 U/L — ABNORMAL HIGH (ref 105–235)
LDH: 365 U/L — ABNORMAL HIGH (ref 105–235)

## 2025-01-30 LAB — FOLATE: Folate: 13.3 ng/mL

## 2025-01-30 LAB — PREPARE PLATELET PHERESIS: Unit division: 0

## 2025-01-30 LAB — MRSA NEXT GEN BY PCR, NASAL: MRSA by PCR Next Gen: NOT DETECTED

## 2025-01-30 LAB — PROCALCITONIN: Procalcitonin: 0.89 ng/mL

## 2025-01-30 LAB — TSH: TSH: 1.92 u[IU]/mL (ref 0.350–4.500)

## 2025-01-30 LAB — VITAMIN B12: Vitamin B-12: 410 pg/mL (ref 180–914)

## 2025-01-30 LAB — SURGICAL PATHOLOGY

## 2025-01-30 LAB — PHOSPHORUS
Phosphorus: 3.4 mg/dL (ref 2.5–4.6)
Phosphorus: 3.4 mg/dL (ref 2.5–4.6)
Phosphorus: 2.5 mg/dL (ref 2.5–4.6)

## 2025-01-30 MED ORDER — BIOTENE DRY MOUTH MT LIQD
15.0000 mL | OROMUCOSAL | Status: DC | PRN
  Administered 2025-01-31: 15 mL via OROMUCOSAL

## 2025-01-30 MED ORDER — PHENOL 1.4 % MT LIQD
1.0000 | OROMUCOSAL | Status: DC | PRN
  Administered 2025-01-30: 1 via OROMUCOSAL
  Filled 2025-01-30: qty 177

## 2025-01-30 MED ORDER — FUROSEMIDE 10 MG/ML IJ SOLN
40.0000 mg | Freq: Once | INTRAMUSCULAR | Status: AC
  Administered 2025-01-30: 40 mg via INTRAVENOUS
  Filled 2025-01-30: qty 4

## 2025-01-30 NOTE — Progress Notes (Signed)
 SPIRITUAL CARE AND COUNSELING CONSULT NOTE   VISIT SUMMARY   Reason for Visit: Chaplain responding to referral from Palliative Care suggesting that Pt and family could use support.  Description of Visit: Upon entering the room I found Moxie in bed with 2 granddaughters there for support.  Granddaughter reports that Pt just received cancer diagnosis last night.  They are distressed at how quickly every thing has unfolded.   Arisbeth is reflective, stating 3 times in regards to her family- I guess this is what God put me here for. I hope to explore that in a follow-up visit tomorrow.   In the midst of the visit more family showed up and stated there were still others waiting downstairs to visit.  In order to allow family time to visit with Pt chaplain excused himself with a promise to return tomorrow.   Pt requested prayer and chaplain provided.   Plan of Care: I will plan to follow up with this Pt throughout this hospitalization.   SPIRITUAL ENCOUNTER                                                                                                                                                                      Type of Visit: Initial Care provided to:: Patient, Family Referral source: Nurse (RN/NT/LPN) Reason for visit: Urgent spiritual support OnCall Visit: No   SPIRITUAL FRAMEWORK  Presenting Themes: Meaning/purpose/sources of inspiration, Rituals and practive, Courage hope and growth Values/beliefs: Raechel professes Christian faith Community/Connection: Family Strengths: Not assessed at this time Needs/Challenges/Barriers: Not assessed at this time Patient Stress Factors: Health changes, Major life changes Family Stress Factors: Major life changes, Health changes   GOALS       INTERVENTIONS   Spiritual Care Interventions Made: Established relationship of care and support, Compassionate presence, Prayer    INTERVENTION OUTCOMES   Outcomes: Connection to spiritual  care, Awareness of support  SPIRITUAL CARE PLAN        If immediate needs arise, please consult [facility] schedule for chaplain coverage   Maude DELENA Roll, Chaplain  01/30/2025 3:42 PM

## 2025-01-30 NOTE — Progress Notes (Addendum)
 "          PROGRESS NOTE  Erica [name] FMW:[id number] DOB: 02/06/1930 DOA: 01/29/2025 PCP: Gladis Mustard, [name]  Brief History:  89 year old female with a history of atrial fibrillation on apixaban , hypertension, hyperlipidemia, gastroparesis, depression, left foot drop presenting with at least 1 week history of nausea, decreased oral intake, and generalized weakness.  The patient's daughter is at the bedside to supplement the history.  At baseline, the patient lives alone with help from her family who stops and to help and checking up on her frequently.  At baseline, the patient uses a walker and cane to assist with ambulation.  She is otherwise fairly independent with her ADLs.  However, family states that in the past 6 months there has been a functional decline.  They feel she has lost about 15 pounds in the last 6 months.  In the past week, there is a significant decline in her oral intake and strength.  There are no reports of fever, chills, chest pain, abdominal pain, diarrhea.  She has some occasional choking with eating.  She has chronic issues with dysphagia and nausea and gastroparesis.  She has not been start any new medications. In the ED, the patient had a low-grade temperature 100.3 F.  She was hemodynamically stable.  Oxygen saturation was 95% on 3 L.  WBC 157.2, hemoglobin 10.7, platelets 14.  Sodium 134, potassium 4.4, bicarbonate 20, serum creatinine 0.85.  AST 44, ALT 54, alk phosphatase 86, total bilirubin 0.5.  He LDH 343.  COVID-19 negative.  The patient was started IV fluids. CT neck negative for any acute findings. CT CAP--patchy left upper lobe airspace disease.  Small bilateral pleural effusions left greater than right, cardiomegaly, stable 1.5 cm mesenteric nodule with central calcification.  The patient was given 1 unit platelets.   Assessment/Plan: Thrombocytopenia/leukemoid reaction -Concerning for acute leukemia - Peripheral smear showed 96% blasts - Family  leaning toward not pursuing treatment - Palliative medicine consultation - Transfuse platelets for platelets < 20,000 - Appreciate hematology consultation - Follow serial uric acid and LDH  Acute respiratory failure with hypoxia -stable on 2-3L -due to PNA -wean oxygen for saturation >92%  Lobar pneumonia - Continue ceftriaxone  and doxycycline  - MRSA screen - Check PCT  Pleural effusions - Echocardiogram - Check proBNP  Essential hypertension - Holding amlodipine  temporarily for soft BP - Continue irbesartan  for now  Hypothyroidism - Continue Synthroid   Heme positive stool -Per ED provider, brown stool on rectal exam, stool occult positive  - Start pantoprazole  twice daily  Failure to thrive - B12 - Folate - PT evaluation - TSH - UA  Atrial fibrillation -Rate controlled - Poor candidate for apixaban  at this time secondary to thrombocytopenia - Not on any rate controlling agents outpatient     Family Communication:   Family at bedside 2/4  Consultants:    Code Status:  FULL / DNR  DVT Prophylaxis:  [place] Heparin  / [place] Lovenox    Procedures: As Listed in Progress Note Above  Antibiotics: Ceftriaxone  2/3>> Doxy 2/3>>        Subjective: I feel lousy.  The patient states that she feels weak all over.  She denies any chest pain, abdominal pain.  She has some shortness of breath and dry cough.  She denies any nausea, vomiting, diarrhea, abdominal pain.  There is no chest pain.  There is no hematochezia or melena.  Objective: Vitals:   01/30/25 0600 01/30/25 0700 01/30/25 0800 01/30/25 0900  BP: ROLLEN)  117/39 (!) 118/42 (!) 107/37 (!) 125/40  Pulse: (!) 55 61 (!) 56 73  Resp: 18 18 18 15   Temp:      TempSrc:      SpO2: 93% 91% 93% 99%  Weight:      Height:        Intake/Output Summary (Last 24 hours) at 01/30/2025 0955 Last data filed at 01/30/2025 0051 Gross per 24 hour  Intake 990.16 ml  Output --  Net 990.16 ml   Weight change:   Exam:  General:  Pt is alert, follows commands appropriately, not in acute distress HEENT: No icterus, No thrush, No neck mass, /AT Cardiovascular: RRR, S1/S2, no rubs, no gallops Respiratory: Bilateral rales.  No wheezing.  Good air movement Abdomen: Soft/+BS, non tender, non distended, no guarding Extremities: 1 + LE edema, No lymphangitis, No petechiae, No rashes, no synovitis   Data Reviewed: I have personally reviewed following labs and imaging studies Basic Metabolic Panel: Recent Labs  Lab 01/29/25 1205 01/29/25 1349 01/30/25 0437  NA 134*  --  135  K 4.4  --  3.6  CL 101  --  102  CO2 20*  --  20*  GLUCOSE 134*  --  125*  BUN 26*  --  21  CREATININE 0.85  --  0.83  CALCIUM  8.9  --  8.0*  MG  --  2.4  --   PHOS  --  3.0 3.4   Liver Function Tests: Recent Labs  Lab 01/29/25 1205  AST 44*  ALT 54*  ALKPHOS 86  BILITOT 0.5  PROT 6.3*  ALBUMIN 3.3*   Recent Labs  Lab 01/29/25 1205  LIPASE 11   No results for input(s): AMMONIA in the last 168 hours. Coagulation Profile: Recent Labs  Lab 01/29/25 1205  INR 1.5*   CBC: Recent Labs  Lab 01/29/25 1205 01/30/25 0437  WBC 157.2* 144.8*  NEUTROABS 1.6*  --   HGB 8.3* 7.4*  HCT 25.8* 22.9*  MCV 111.7* 112.3*  PLT 14* 35*   Cardiac Enzymes: No results for input(s): CKTOTAL, CKMB, CKMBINDEX, TROPONINI in the last 168 hours. BNP: Invalid input(s): POCBNP CBG: No results for input(s): GLUCAP in the last 168 hours. HbA1C: No results for input(s): HGBA1C in the last 72 hours. Urine analysis:    Component Value Date/Time   COLORURINE COLORLESS (A) 05/17/2020 1622   APPEARANCEUR Clear 06/22/2023 1547   LABSPEC 1.002 (L) 05/17/2020 1622   PHURINE 7.0 05/17/2020 1622   GLUCOSEU Negative 06/22/2023 1547   HGBUR MODERATE (A) 05/17/2020 1622   BILIRUBINUR Negative 06/22/2023 1547   KETONESUR NEGATIVE 05/17/2020 1622   PROTEINUR Negative 06/22/2023 1547   PROTEINUR NEGATIVE  05/17/2020 1622   UROBILINOGEN 1.0 07/01/2011 1951   NITRITE Negative 06/22/2023 1547   NITRITE NEGATIVE 05/17/2020 1622   LEUKOCYTESUR 1+ (A) 06/22/2023 1547   LEUKOCYTESUR NEGATIVE 05/17/2020 1622   Sepsis Labs: @LABRCNTIP (procalcitonin:4,lacticidven:4) ) Recent Results (from the past 240 hours)  Resp panel by RT-PCR (RSV, Flu A&B, Covid) Anterior Nasal Swab     Status: None   Collection Time: 01/29/25  3:35 PM   Specimen: Anterior Nasal Swab  Result Value Ref Range Status   SARS Coronavirus 2 by RT PCR NEGATIVE NEGATIVE Final    Comment: (NOTE) SARS-CoV-2 target nucleic acids are NOT DETECTED.  The SARS-CoV-2 RNA is generally detectable in upper respiratory specimens during the acute phase of infection. The lowest concentration of SARS-CoV-2 viral copies this assay can detect is 138 copies/mL. A  negative result does not preclude SARS-Cov-2 infection and should not be used as the sole basis for treatment or other patient management decisions. A negative result may occur with  improper specimen collection/handling, submission of specimen other than nasopharyngeal swab, presence of viral mutation(s) within the areas targeted by this assay, and inadequate number of viral copies(<138 copies/mL). A negative result must be combined with clinical observations, patient history, and epidemiological information. The expected result is Negative.  Fact Sheet for Patients:  bloggercourse.com  Fact Sheet for Healthcare Providers:  seriousbroker.it  This test is no t yet approved or cleared by the [place]  FDA and  has been authorized for detection and/or diagnosis of SARS-CoV-2 by FDA under an Emergency Use Authorization (EUA). This EUA will remain  in effect (meaning this test can be used) for the duration of the COVID-19 declaration under Section 564(b)(1) of the Act, 21 U.S.C.section 360bbb-3(b)(1), unless the authorization is  terminated  or revoked sooner.       Influenza A by PCR NEGATIVE NEGATIVE Final   Influenza B by PCR NEGATIVE NEGATIVE Final    Comment: (NOTE) The Xpert Xpress SARS-CoV-2/FLU/RSV plus assay is intended as an aid in the diagnosis of influenza from Nasopharyngeal swab specimens and should not be used as a sole basis for treatment. Nasal washings and aspirates are unacceptable for Xpert Xpress SARS-CoV-2/FLU/RSV testing.  Fact Sheet for Patients: bloggercourse.com  Fact Sheet for Healthcare Providers: seriousbroker.it  This test is not yet approved or cleared by the [place]  FDA and has been authorized for detection and/or diagnosis of SARS-CoV-2 by FDA under an Emergency Use Authorization (EUA). This EUA will remain in effect (meaning this test can be used) for the duration of the COVID-19 declaration under Section 564(b)(1) of the Act, 21 U.S.C. section 360bbb-3(b)(1), unless the authorization is terminated or revoked.     Resp Syncytial Virus by PCR NEGATIVE NEGATIVE Final    Comment: (NOTE) Fact Sheet for Patients: bloggercourse.com  Fact Sheet for Healthcare Providers: seriousbroker.it  This test is not yet approved or cleared by the [place]  FDA and has been authorized for detection and/or diagnosis of SARS-CoV-2 by FDA under an Emergency Use Authorization (EUA). This EUA will remain in effect (meaning this test can be used) for the duration of the COVID-19 declaration under Section 564(b)(1) of the Act, 21 U.S.C. section 360bbb-3(b)(1), unless the authorization is terminated or revoked.  Performed at [facility], [address], [place], [place] [address]   MRSA Next Gen by PCR, Nasal     Status: None   Collection Time: 01/29/25  8:15 PM   Specimen: Nasal Mucosa; Nasal Swab  Result Value Ref Range Status   MRSA by PCR Next Gen NOT DETECTED NOT DETECTED  Final    Comment: (NOTE) The GeneXpert MRSA Assay (FDA approved for NASAL specimens only), is one component of a comprehensive MRSA colonization surveillance program. It is not intended to diagnose MRSA infection nor to guide or monitor treatment for MRSA infections. Test performance is not FDA approved in patients less than 81 years old. Performed at [facility], [address] [place], [place] [address]      Scheduled Meds:  sodium chloride    Intravenous Once   allopurinol   50 mg Oral Daily   Chlorhexidine  Gluconate Cloth  6 each Topical Daily   irbesartan   300 mg Oral Daily   levothyroxine   88 mcg Oral Q0600   Continuous Infusions:  cefTRIAXone  (ROCEPHIN )  IV Stopped (01/29/25 2256)  doxycycline  (VIBRAMYCIN ) IV 125 mL/hr at 01/30/25 0051    Procedures/Studies: CT Soft Tissue Neck Wo Contrast Result Date: 01/29/2025 EXAM: CT NECK WITHOUT CONTRAST 01/29/2025 04:06:28 PM TECHNIQUE: CT of the neck was performed without the administration of intravenous contrast. Multiplanar reformatted images are provided for review. Automated exposure control, iterative reconstruction, and/or weight based adjustment of the mA/kV was utilized to reduce the radiation dose to as low as reasonably achievable. COMPARISON: None available. CLINICAL HISTORY: Swelling right side of neck, leukocytosis. Swelling on the right side of the neck; leukocytosis. FINDINGS: AERODIGESTIVE TRACT: The nasopharynx is symmetric. Symmetric appearance of the oropharynx and tonsils. Normal appearance of the oral cavity and floor of mouth. Normal appearance of the epiglottis. The retropharynx is unremarkable. Symmetric appearance of the supraglottic airway. The vocal folds are symmetric. No discrete mass. No edema. SALIVARY GLANDS: The parotid and submandibular glands are unremarkable. THYROID : Unremarkable. LYMPH NODES: No suspicious cervical lymphadenopathy. No lymphadenopathy appreciated. SOFT TISSUES: There is no focal  inflammatory change visualized within the soft tissues in the right aspect of the neck. No asymmetric soft tissue swelling or fluid collection appreciated. There is atherosclerosis of the right carotid bifurcation likely resulting in at least mild stenosis although evaluation is limited by lack of IV contrast. No mass. BONES: Degenerative changes in the visualized spine with disc space narrowing greatest at C4-C5. Edentulous maxilla. Degenerative changes of the left temporomandibular joint. OTHER: Visualized sinuses and mastoid air cells are well aerated. Partially visualized left pleural effusion. There is atherosclerosis noted along the aortic arch. There are patchy opacities in the left upper lobe better characterized on the same day CT chest. IMPRESSION: 1. No acute findings in the right side of the neck related to the reported swelling and leukocytosis. 2. Partially visualized left pleural effusion and patchy left upper lobe opacities, better characterized on the same day CT chest. Electronically signed by: Donnice Mania MD 01/29/2025 04:24 PM EST RP Workstation: [id number]   CT CHEST ABDOMEN PELVIS WO CONTRAST Result Date: 01/29/2025 CLINICAL DATA:  Leukocytosis, dyspnea, weakness and nausea for 1 week EXAM: CT CHEST, ABDOMEN AND PELVIS WITHOUT CONTRAST TECHNIQUE: Multidetector CT imaging of the chest, abdomen and pelvis was performed following the standard protocol without IV contrast. Examination was performed without contrast at the request of the ordering clinician. Assessment of the vascular structures, soft tissues, and solid viscera is severely limited. RADIATION DOSE REDUCTION: This exam was performed according to the departmental dose-optimization program which includes automated exposure control, adjustment of the mA and/or kV according to patient size and/or use of iterative reconstruction technique. COMPARISON:  08/08/2024 FINDINGS: CT CHEST FINDINGS Cardiovascular: Unenhanced imaging of the heart  demonstrates borderline cardiomegaly. Decreased attenuation within the cardiac chambers may signify underlying anemia. Calcifications of the mitral and aortic valves. Normal caliber of the thoracic aorta. Atherosclerosis of the aorta and coronary vasculature. Assessment of the vascular lumen cannot be performed without intravenous contrast. Mediastinum/Nodes: No enlarged mediastinal, hilar, or axillary lymph nodes. Thyroid  gland, trachea, and esophagus demonstrate no significant findings. Lungs/Pleura: There are small bilateral pleural effusions, left greater than right. Bilateral lower lobe atelectasis, greatest within the left lower lobe. There is patchy left upper lobe airspace disease concerning for bronchopneumonia. No pneumothorax. The central airways are patent. Musculoskeletal: No acute or destructive bony abnormalities. Reconstructed images demonstrate no additional findings. CT ABDOMEN PELVIS FINDINGS Hepatobiliary: Increased liver attenuation compatible with chronic amiodarone  therapy. Prior cholecystectomy. No biliary duct dilation. Pancreas: Unremarkable unenhanced appearance. Evaluation limited by respiratory motion. Spleen: Unremarkable  unenhanced appearance. Adrenals/Urinary Tract: No urinary tract calculi or obstructive uropathy within either kidney. The adrenals and bladder are unremarkable. Stomach/Bowel: No bowel obstruction or ileus. Normal appendix right lower quadrant. Distal colonic diverticulosis without diverticulitis. No bowel wall thickening or inflammatory change. Moderate stool throughout the distal colon. Vascular/Lymphatic: Aortic atherosclerosis. A central mesenteric nodule containing coarse central calcification measures 1.5 by 1.4 cm reference image 81/2, stable since prior study. This may reflect a calcified mesenteric lymph node. No other pathologic adenopathy is identified. Reproductive: Uterus and bilateral adnexa are unremarkable. Other: Trace pelvic free fluid. No free  intraperitoneal gas. No abdominal wall hernia. Musculoskeletal: No acute or destructive bony abnormalities. Reconstructed images demonstrate no additional findings. IMPRESSION: 1. Patchy left upper lobe airspace disease consistent with bronchopneumonia. 2. Small bilateral pleural effusions, left greater than right. 3. Cardiomegaly. Decreased attenuation within the cardiac chambers consistent with anemia. 4. Stable 1.5 cm mesenteric nodule with central coarse calcification, likely calcified lymph node. No other pathologic adenopathy. 5. Distal colonic diverticulosis without diverticulitis. 6.  Aortic Atherosclerosis (ICD10-I70.0). Electronically Signed   By: Ozell Daring M.D.   On: 01/29/2025 16:21    Alm Schneider, DO  [facility]  If 7PM-7AM, please contact night-coverage [email or web address] Password [email or web address] 01/30/2025, 9:55 AM   LOS: 1 day   "

## 2025-01-30 NOTE — Consult Note (Signed)
 "                              Consultation Note Date: 01/30/2025   Patient Name: Erica [name]  DOB: Jun 17, 1930  MRN: [id number]  Age / Sex: 89 y.o., female  PCP: Gladis Mustard, [name] Referring Physician: Evonnie Lenis, MD  Reason for Consultation: {Reason for Consult:23484}  Discussed the use of AI scribe software for clinical note transcription with the patient, who gave verbal consent to proceed.  History of Present Illness      HPI/Patient Profile: 89 y.o. female  with past medical history of *** admitted on 01/29/2025 with ***.    PMT has been consulted to assist with goals of care conversation.  Clinical Assessment and Goals of Care:  I have reviewed medical records including EPIC notes, labs and imaging (independently reviewed), ***, assessed the patient and then *** with *** to discuss diagnosis prognosis, GOC, EOL wishes, disposition and options. Collaborated directly with ***.   I introduced Palliative Medicine as specialized medical care for people living with serious illness. It focuses on providing relief from the symptoms and stress of a serious illness. The goal is to improve quality of life for both the patient and the family.  We discussed a brief life review of the patient and then focused on their current illness. ***  I attempted to elicit values and goals of care important to the patient.    Medical History Review and Family/Patient Understanding:   ***  Social History:  ***  Functional and Nutritional State:  ***  Palliative Symptoms:  ***  Advance Directives/Goals of Care/Anticipatory care planning Discussion:  A detailed discussion regarding GOC, advanced directives, and anticipatory care planning was had. ***    The difference between aggressive medical intervention and comfort care was considered in light of the patient's goals of care. Hospice and Palliative Care services outpatient were explained and offered.   Discussed the  importance of continued conversation with family and the medical providers regarding overall plan of care and treatment options, ensuring decisions are within the context of the patients values and GOCs.   Questions and concerns were addressed.  Hard Choices booklet left for review. The family was encouraged to call with questions or concerns.  PMT will continue to support holistically.  Primary Decision maker and health care surrogate:  {Primary Decision Fjxzm:78612}  Code Status:  ***    SUMMARY OF RECOMMENDATIONS    Assessment and Plan Assessment & Plan       Code Status/Advance Care Planning: {Palliative Code status:23503}   Symptom Management:  ***  Palliative Prophylaxis:  {Palliative Prophylaxis:21015}  Additional Recommendations (Limitations, Scope, Preferences): {Recommended Scope and Preferences:21019}  Psycho-social/Spiritual:  Desire for further Chaplaincy support:{YES NO:22349} Additional Recommendations: {PAL SOCIAL:21064}  Prognosis:  {Palliative Care Prognosis:23504}  Discharge Planning: {Palliative dispostion:23505}      Primary Diagnoses: Present on Admission:  Thrombocytopenia  Leukocytosis  Pneumonia  HTN (hypertension)  Hypothyroidism  Gastroparesis    Physical Exam  Vital Signs: BP (!) 130/37   Pulse (!) 58   Temp (!) 97 F (36.1 C) (Oral)   Resp 19   Ht 5' 1 (1.549 m)   Wt 51.2 kg   SpO2 96%   BMI 21.33 kg/m  Pain Scale: 0-10   Pain Score: 0-No pain   SpO2: SpO2: 96 % O2 Device:SpO2: 96 % O2 Flow Rate: .O2 Flow Rate (L/min): 4 L/min   Palliative  Assessment/Data:    Total time:  I personally spent a total of *** minutes in the care of the patient today including {Time Based Coding:210964241}.   Billing based on MDM: ***  {Problems Addressed:304933}  {Amount and/or Complexity of Ijuj:695065}  {Risks:304936}   Laymon CHRISTELLA Pinal, NP  Palliative Medicine Team Team phone # [phone]  Thank you  for allowing the Palliative Medicine Team to assist in the care of this patient. Please utilize secure chat with additional questions, if there is no response within 30 minutes please call the above phone number.  Palliative Medicine Team providers are available by phone from 7am to 7pm daily and can be reached through the team cell phone.  Should this patient require assistance outside of these hours, please call the patient's attending physician.    "

## 2025-01-30 NOTE — Hospital Course (Signed)
 89 year old female with a history of atrial fibrillation on apixaban , hypertension, hyperlipidemia, gastroparesis, depression, left foot drop presenting with at least 1 week history of nausea, decreased oral intake, and generalized weakness.  The patient's daughter is at the bedside to supplement the history.  At baseline, the patient lives alone with help from her family who stops and to help and checking up on her frequently.  At baseline, the patient uses a walker and cane to assist with ambulation.  She is otherwise fairly independent with her ADLs.  However, family states that in the past 6 months there has been a functional decline.  They feel she has lost about 15 pounds in the last 6 months.  In the past week, there is a significant decline in her oral intake and strength.  There are no reports of fever, chills, chest pain, abdominal pain, diarrhea.  She has some occasional choking with eating.  She has chronic issues with dysphagia and nausea and gastroparesis.  She has not been start any new medications. In the ED, the patient had a low-grade temperature 100.3 F.  She was hemodynamically stable.  Oxygen saturation was 95% on 3 L.  WBC 157.2, hemoglobin 10.7, platelets 14.  Sodium 134, potassium 4.4, bicarbonate 20, serum creatinine 0.85.  AST 44, ALT 54, alk phosphatase 86, total bilirubin 0.5.  He LDH 343.  COVID-19 negative.  The patient was started IV fluids. CT neck negative for any acute findings. CT CAP--patchy left upper lobe airspace disease.  Small bilateral pleural effusions left greater than right, cardiomegaly, stable 1.5 cm mesenteric nodule with central calcification.  The patient was given 1 unit platelets. The patient was given IV furosemide  for fluid overload.  She was started on ceftriaxone  doxycycline  for pneumonia.  Respiratory status stabilized and improved.  Palliative medicine was consulted.  Goals of care discussions were undertaken.  Ultimately, the patient's family and  patient decided not to pursue any further diagnostic or therapeutic options regarding her presumptive acute myeloid leukemia.  Hematology was consulted.  Dr. Davonna saw the patient.  Peripheral flow cytometry was ordered.  It was consistent with acute myeloid leukemia.  She was deemed to be a poor candidate for aggressive treatment.  Ultimately, the patient and family wanted to go home with hospice care.  On 02/01/2025 the patient's vital signs remained stable and she was clinically stable for transfer home.  TOC help assist in the transition to home hospice.

## 2025-01-30 NOTE — TOC CM/SW Note (Signed)
 Transition of Care [facility]) - Inpatient Brief Assessment   Patient Details  Name: Erica [name] MRN: [id number] Date of Birth: 15-Jun-1930  Transition of Care [facility]) CM/SW Contact:    Lucie Lunger, LCSWA Phone Number: 01/30/2025, 9:58 AM   Clinical Narrative: TOC will continue to monitor patient advancement through interdiciplinary progression rounds. TOC to follow for discharge planning needs.   Transition of Care Asessment: Insurance and Status: Insurance coverage has been reviewed Patient has primary care physician: Yes Home environment has been reviewed: From home Prior level of function:: Family assistance Prior/Current Home Services: No current home services Social Drivers of Health Review: SDOH reviewed no interventions necessary Readmission risk has been reviewed: Yes Transition of care needs: transition of care needs identified, TOC will continue to follow

## 2025-01-30 NOTE — Plan of Care (Signed)
  Problem: Education: Goal: Knowledge of General Education information will improve Description: Including pain rating scale, medication(s)/side effects and non-pharmacologic comfort measures Outcome: Progressing   Problem: Health Behavior/Discharge Planning: Goal: Ability to manage health-related needs will improve Outcome: Progressing   Problem: Clinical Measurements: Goal: Ability to maintain clinical measurements within normal limits will improve Outcome: Progressing Goal: Will remain free from infection Outcome: Progressing Goal: Diagnostic test results will improve Outcome: Progressing Goal: Respiratory complications will improve Outcome: Progressing Goal: Cardiovascular complication will be avoided Outcome: Progressing   Problem: Activity: Goal: Risk for activity intolerance will decrease Outcome: Progressing   Problem: Nutrition: Goal: Adequate nutrition will be maintained Outcome: Progressing   Problem: Coping: Goal: Level of anxiety will decrease Outcome: Progressing   Problem: Elimination: Goal: Will not experience complications related to bowel motility Outcome: Progressing Goal: Will not experience complications related to urinary retention Outcome: Progressing   Problem: Safety: Goal: Ability to remain free from injury will improve Outcome: Progressing   Problem: Skin Integrity: Goal: Risk for impaired skin integrity will decrease Outcome: Progressing   Problem: Activity: Goal: Ability to tolerate increased activity will improve Outcome: Progressing   Problem: Clinical Measurements: Goal: Ability to maintain a body temperature in the normal range will improve Outcome: Progressing   Problem: Respiratory: Goal: Ability to maintain adequate ventilation will improve Outcome: Progressing Goal: Ability to maintain a clear airway will improve Outcome: Progressing

## 2025-01-31 DIAGNOSIS — D696 Thrombocytopenia, unspecified: Secondary | ICD-10-CM | POA: Diagnosis not present

## 2025-01-31 DIAGNOSIS — J9601 Acute respiratory failure with hypoxia: Secondary | ICD-10-CM | POA: Diagnosis not present

## 2025-01-31 DIAGNOSIS — J181 Lobar pneumonia, unspecified organism: Secondary | ICD-10-CM | POA: Diagnosis not present

## 2025-01-31 LAB — CBC WITH DIFFERENTIAL/PLATELET
Basophils Absolute: 0 10*3/uL (ref 0.0–0.1)
Basophils Relative: 0 %
Blasts: 95 %
Eosinophils Absolute: 0 10*3/uL (ref 0.0–0.5)
Eosinophils Relative: 0 %
HCT: 21.5 % — ABNORMAL LOW (ref 36.0–46.0)
Hemoglobin: 6.9 g/dL — CL (ref 12.0–15.0)
Lymphocytes Relative: 3 %
Lymphs Abs: 5.7 10*3/uL — ABNORMAL HIGH (ref 0.7–4.0)
MCH: 36.1 pg — ABNORMAL HIGH (ref 26.0–34.0)
MCHC: 32.1 g/dL (ref 30.0–36.0)
MCV: 112.6 fL — ABNORMAL HIGH (ref 80.0–100.0)
Monocytes Absolute: 0 10*3/uL — ABNORMAL LOW (ref 0.1–1.0)
Monocytes Relative: 0 %
Neutro Abs: 3.8 10*3/uL (ref 1.7–7.7)
Neutrophils Relative %: 2 %
Platelets: 20 10*3/uL — CL (ref 150–400)
RBC: 1.91 MIL/uL — ABNORMAL LOW (ref 3.87–5.11)
RDW: 18.7 % — ABNORMAL HIGH (ref 11.5–15.5)
WBC: 189.7 10*3/uL (ref 4.0–10.5)
nRBC: 0.1 % (ref 0.0–0.2)

## 2025-01-31 LAB — PREPARE RBC (CROSSMATCH)

## 2025-01-31 LAB — URIC ACID: Uric Acid, Serum: 2.3 mg/dL — ABNORMAL LOW (ref 2.5–7.1)

## 2025-01-31 LAB — PHOSPHORUS: Phosphorus: 2.2 mg/dL — ABNORMAL LOW (ref 2.5–4.6)

## 2025-01-31 LAB — BASIC METABOLIC PANEL WITH GFR
Anion gap: 9 (ref 5–15)
BUN: 21 mg/dL (ref 8–23)
CO2: 22 mmol/L (ref 22–32)
Calcium: 7.8 mg/dL — ABNORMAL LOW (ref 8.9–10.3)
Chloride: 102 mmol/L (ref 98–111)
Creatinine, Ser: 0.92 mg/dL (ref 0.44–1.00)
GFR, Estimated: 58 mL/min — ABNORMAL LOW
Glucose, Bld: 117 mg/dL — ABNORMAL HIGH (ref 70–99)
Potassium: 3.3 mmol/L — ABNORMAL LOW (ref 3.5–5.1)
Sodium: 133 mmol/L — ABNORMAL LOW (ref 135–145)

## 2025-01-31 LAB — MAGNESIUM: Magnesium: 2.3 mg/dL (ref 1.7–2.4)

## 2025-01-31 LAB — PATHOLOGIST SMEAR REVIEW

## 2025-01-31 LAB — LACTATE DEHYDROGENASE: LDH: 334 U/L — ABNORMAL HIGH (ref 105–235)

## 2025-01-31 MED ORDER — MORPHINE SULFATE (CONCENTRATE) 10 MG /0.5 ML PO SOLN: 5.0000 mg | ORAL | Status: DC | PRN

## 2025-01-31 MED ORDER — K PHOS MONO-SOD PHOS DI & MONO 155-852-130 MG PO TABS
500.0000 mg | ORAL_TABLET | Freq: Two times a day (BID) | ORAL | Status: DC
  Administered 2025-01-31 – 2025-02-01 (×3): 500 mg via ORAL
  Filled 2025-01-31 (×3): qty 2

## 2025-01-31 MED ORDER — SENNA 8.6 MG PO TABS: 2.0000 | ORAL_TABLET | Freq: Every evening | ORAL | Status: DC | PRN

## 2025-01-31 MED ORDER — DOXYCYCLINE HYCLATE 100 MG PO TABS
100.0000 mg | ORAL_TABLET | Freq: Two times a day (BID) | ORAL | Status: DC
  Administered 2025-01-31 – 2025-02-01 (×2): 100 mg via ORAL
  Filled 2025-01-31 (×2): qty 1

## 2025-01-31 MED ORDER — SODIUM CHLORIDE 0.9% IV SOLUTION: Freq: Once | INTRAVENOUS | Status: AC

## 2025-01-31 MED ORDER — FUROSEMIDE 10 MG/ML IJ SOLN
60.0000 mg | Freq: Once | INTRAMUSCULAR | Status: AC
  Administered 2025-01-31: 60 mg via INTRAVENOUS
  Filled 2025-01-31: qty 6

## 2025-01-31 MED ORDER — ALPRAZOLAM 0.25 MG PO TABS
0.2500 mg | ORAL_TABLET | Freq: Three times a day (TID) | ORAL | Status: DC | PRN
  Administered 2025-01-31 – 2025-02-01 (×3): 0.25 mg via ORAL
  Filled 2025-01-31 (×3): qty 1

## 2025-01-31 MED ORDER — POTASSIUM CHLORIDE CRYS ER 20 MEQ PO TBCR
40.0000 meq | EXTENDED_RELEASE_TABLET | Freq: Every day | ORAL | Status: DC
  Administered 2025-01-31 – 2025-02-01 (×2): 40 meq via ORAL
  Filled 2025-01-31 (×2): qty 2

## 2025-01-31 NOTE — Plan of Care (Signed)
" °  Problem: Education: Goal: Knowledge of General Education information will improve Description: Including pain rating scale, medication(s)/side effects and non-pharmacologic comfort measures Outcome: Progressing   Problem: Health Behavior/Discharge Planning: Goal: Ability to manage health-related needs will improve Outcome: Progressing   Problem: Clinical Measurements: Goal: Ability to maintain clinical measurements within normal limits will improve Outcome: Progressing Goal: Respiratory complications will improve Outcome: Progressing Goal: Cardiovascular complication will be avoided Outcome: Progressing   Problem: Activity: Goal: Risk for activity intolerance will decrease Outcome: Progressing   Problem: Coping: Goal: Level of anxiety will decrease Outcome: Progressing   Problem: Skin Integrity: Goal: Risk for impaired skin integrity will decrease Outcome: Progressing   Problem: Activity: Goal: Ability to tolerate increased activity will improve Outcome: Progressing   "

## 2025-01-31 NOTE — Progress Notes (Signed)
" °                                                                                                     °                                                   °  Daily Progress Note   Patient Name: Erica [name]       Date: 01/31/2025 DOB: [birth year]  Age: 89 y.o. MRN#: [id number] Attending Physician: Evonnie Lenis, MD Primary Care Physician: Gladis Mustard, [name] Admit Date: 01/29/2025  Reason for Initial Consultation/Follow-up: {Reason for Consult:23484}  HPI:  Discussed the use of AI scribe software for clinical note transcription with the patient, who gave verbal consent to proceed.  History of Present Illness      Today, ***  Chart review/care coordination:  Completed extensive chart review including EPIC notes, ***. Coordinated care with ****.    Length of Stay: 2   Physical Exam          Vital Signs: BP (!) 135/59 (BP Location: Right Arm)   Pulse 69   Temp 98.2 F (36.8 C) (Oral)   Resp 18   Ht 5' 1 (1.549 m)   Wt 51.2 kg   SpO2 94%   BMI 21.33 kg/m  SpO2: SpO2: 94 % O2 Device: O2 Device: Nasal Cannula O2 Flow Rate: O2 Flow Rate (L/min): 3 L/min      Palliative Assessment/Data:   Palliative Care Assessment & Plan   Patient Profile/Assessment:  ***   Recommendations/Plan:  Assessment and Plan Assessment & Plan        Symptom management:  ***  Prognosis:  {Palliative Care Prognosis:23504}    Discharge Planning: {Palliative dispostion:23505}    Detailed review of medical records (labs, imaging, vital signs), medically appropriate exam, discussed with treatment team, counseling and education to patient, family, & staff, documenting clinical information, medication management, coordination of care   Total time:  I personally spent a total of *** minutes in the care of the patient today including {Time Based Coding:210964241}.    Billing based on MDM: ***  {Problems Addressed:304933}  {Amount and/or Complexity of  Ijuj:695065}  {Risks:304936}         Laymon CHRISTELLA Pinal, NP  Palliative Medicine Team Team phone # [phone]  Thank you for allowing the Palliative Medicine Team to assist in the care of this patient. Please utilize secure chat with additional questions, if there is no response within 30 minutes please call the above phone number.  Palliative Medicine Team providers are available by phone from 7am to 7pm daily and can be reached through the team cell phone.  Should this patient require assistance outside of these hours, please call the patient's attending physician.   "

## 2025-01-31 NOTE — TOC Initial Note (Signed)
 Transition of Care [facility]) - Initial/Assessment Note    Patient Details  Name: Erica [name] MRN: [id number] Date of Birth: Oct 21, 1930  [facility] CM/SW Contact:    Lucie Lunger, LCSWA Phone Number: 01/31/2025, 2:17 PM  Clinical Narrative:                 CSW updated that pt and family would like pt to return home with home hospice services with [facility]. CSW spoke to [place] with [facility] who states she is working on the referral and will follow up with pts son Chyrl and daughter Vina. DME will be ordered by [facility]. TOC to follow.   Expected Discharge Plan: Home w Hospice Care Barriers to Discharge: Continued Medical Work up   Patient Goals and CMS Choice Patient states their goals for this hospitalization and ongoing recovery are:: return home CMS Medicare.gov Compare Post Acute Care list provided to:: Patient Represenative (must comment) Choice offered to / list presented to : Adult Children, Patient      Expected Discharge Plan and Services In-house Referral: Clinical Social Work Discharge Planning Services: CM Consult Post Acute Care Choice: Hospice Living arrangements for the past 2 months: Single Family Home                                      Prior Living Arrangements/Services Living arrangements for the past 2 months: Single Family Home Lives with:: Self, Adult Children Patient language and need for interpreter reviewed:: Yes Do you feel safe going back to the place where you live?: Yes      Need for Family Participation in Patient Care: Yes (Comment) Care giver support system in place?: Yes (comment)   Criminal Activity/Legal Involvement Pertinent to Current Situation/Hospitalization: No - Comment as needed  Activities of Daily Living   ADL Screening (condition at time of admission) Independently performs ADLs?: Yes (appropriate for developmental age) Is the patient deaf or have difficulty hearing?: No Does the patient have difficulty  seeing, even when wearing glasses/contacts?: No Does the patient have difficulty concentrating, remembering, or making decisions?: No  Permission Sought/Granted                  Emotional Assessment Appearance:: Appears stated age       Alcohol / Substance Use: Not Applicable Psych Involvement: No (comment)  Admission diagnosis:  Thrombocytopenia [D69.6] Patient Active Problem List   Diagnosis Date Noted   Acute respiratory failure with hypoxia (HCC) 01/30/2025   Lobar pneumonia 01/30/2025   Thrombocytopenia 01/29/2025   Leukocytosis 01/29/2025   Pneumonia 01/29/2025   Coronary artery disease involving native coronary artery of native heart without angina pectoris 05/31/2022   Age-related osteoporosis without current pathological fracture 02/19/2021   Perianal dermatitis 05/15/2020   Anxiety about health 05/09/2019   Left foot drop 08/25/2016   Internal bleeding hemorrhoids 01/01/2016   Hypothyroidism 07/07/2015   BMI 27.0-27.9,adult 07/07/2015   HTN (hypertension) 12/13/2012   Hyperlipidemia 12/13/2012   Depression 06/06/2012   Gastroparesis 02/17/2012   History of colon polyps 08/06/2011   GERD (gastroesophageal reflux disease) 08/06/2011   PCP:  Lunger Mustard, [name] Pharmacy:   [facility] [place], [place] - [address] [place] [address] Phone: [phone] Fax: [phone]  [id number] - [place], [place] - [address] [place] [address] Phone: [phone] Fax: [phone]  Social Drivers of Health (SDOH) Social History: SDOH Screenings   Food Insecurity: No Food Insecurity (01/29/2025)  Housing: Low Risk (01/29/2025)  Transportation Needs: No Transportation Needs (01/29/2025)  Utilities: Not At Risk (01/29/2025)  Alcohol Screen: Low Risk (02/27/2024)  Depression (PHQ2-9): Low Risk (12/10/2024)  Financial Resource Strain: Low Risk (02/27/2024)  Physical Activity: Insufficiently Active  (02/27/2024)  Social Connections: Moderately Isolated (01/29/2025)  Stress: No Stress Concern Present (02/27/2024)  Tobacco Use: Low Risk (01/30/2025)  Health Literacy: Adequate Health Literacy (02/27/2024)   SDOH Interventions:     Readmission Risk Interventions    01/31/2025    2:15 PM  Readmission Risk Prevention Plan  Medication Screening Complete  Transportation Screening Complete

## 2025-01-31 NOTE — Progress Notes (Signed)
 "          PROGRESS NOTE  Erica [name] FMW:[id number] DOB: May 18, 1930 DOA: 01/29/2025 PCP: Gladis Mustard, [name]  Brief History:  89 year old female with a history of atrial fibrillation on apixaban , hypertension, hyperlipidemia, gastroparesis, depression, left foot drop presenting with at least 1 week history of nausea, decreased oral intake, and generalized weakness.  The patient's daughter is at the bedside to supplement the history.  At baseline, the patient lives alone with help from her family who stops and to help and checking up on her frequently.  At baseline, the patient uses a walker and cane to assist with ambulation.  She is otherwise fairly independent with her ADLs.  However, family states that in the past 6 months there has been a functional decline.  They feel she has lost about 15 pounds in the last 6 months.  In the past week, there is a significant decline in her oral intake and strength.  There are no reports of fever, chills, chest pain, abdominal pain, diarrhea.  She has some occasional choking with eating.  She has chronic issues with dysphagia and nausea and gastroparesis.  She has not been start any new medications. In the ED, the patient had a low-grade temperature 100.3 F.  She was hemodynamically stable.  Oxygen saturation was 95% on 3 L.  WBC 157.2, hemoglobin 10.7, platelets 14.  Sodium 134, potassium 4.4, bicarbonate 20, serum creatinine 0.85.  AST 44, ALT 54, alk phosphatase 86, total bilirubin 0.5.  He LDH 343.  COVID-19 negative.  The patient was started IV fluids. CT neck negative for any acute findings. CT CAP--patchy left upper lobe airspace disease.  Small bilateral pleural effusions left greater than right, cardiomegaly, stable 1.5 cm mesenteric nodule with central calcification.  The patient was given 1 unit platelets.   Assessment/Plan: Thrombocytopenia/leukemoid reaction -Concerning for acute myeloid leukemia on peripheral flow cytometry - Peripheral  smear showed 96% blasts - Family leaning toward not pursuing treatment - Palliative medicine consultation - Transfuse platelets for platelets < 20,000 - Appreciate hematology consultation - Follow serial uric acid and LDH - transfuse another unit platelets today (2 total) - transfuse one unit PRBC 2/5   Acute respiratory failure with hypoxia -stable on 2L with saturation 96-98% on my eval 2/5 -due to PNA and pleural effusions -wean oxygen for saturation >92%   Lobar pneumonia - Continue ceftriaxone  and doxycycline  - MRSA screen--neg - Check PCT--0.89   Acute HFpEF - 01/30/25 Echo--EF 60-65%, G1DD, mild AS - continues to have signs of fluid overload - Continue IV lasix  - proBNP 5099 -   Essential hypertension - Holding amlodipine  temporarily for soft BP - hold irbesartan    Hypothyroidism - Continue Synthroid    Heme positive stool -Per ED provider, brown stool on rectal exam, stool occult positive  - Start pantoprazole  twice daily   Failure to thrive - B12--410 - Folate--13.3 - PT evaluation - TSH--1.920 - UA--no pyuria   Atrial fibrillation -Rate controlled - Poor candidate for apixaban  at this time secondary to thrombocytopenia - Not on any rate controlling agents outpatient        Family Communication: daughter 21/5  Consultants:  med onc, palliative  Code Status: DNR  DVT Prophylaxis:  SCDs  Procedures: As Listed in Progress Note Above  Antibiotics: Ceftriaxone  2/3>> Doxy 2/3>>      Subjective: Patient is breathing little better today.  She denies any nausea, vomiting or diarrhea abdominal pain.  She denies any headache.  She has  a cough.  Objective: Vitals:   01/31/25 0807 01/31/25 0822 01/31/25 1051 01/31/25 1154  BP: (!) 118/51 (!) 124/54 (!) 132/55 (!) 131/53  Pulse: 68 68  65  Resp: 18 18  18   Temp: 99.6 F (37.6 C) 99.9 F (37.7 C)  98 F (36.7 C)  TempSrc: Oral Oral  Oral  SpO2: 90% 92%    Weight:      Height:         Intake/Output Summary (Last 24 hours) at 01/31/2025 1357 Last data filed at 01/31/2025 1154 Gross per 24 hour  Intake 1758.84 ml  Output 350 ml  Net 1408.84 ml   Weight change:  Exam:  General:  Pt is alert, follows commands appropriately, not in acute distress HEENT: No icterus, No thrush, No neck mass, [place]/AT Cardiovascular: RRR, S1/S2, no rubs, no gallops Respiratory: Bilateral rales.  No wheezing.  Good air movement Abdomen: Soft/+BS, non tender, non distended, no guarding Extremities: 1 + LEedema, No lymphangitis, No petechiae, No rashes, no synovitis   Data Reviewed: I have personally reviewed following labs and imaging studies Basic Metabolic Panel: Recent Labs  Lab 01/29/25 1205 01/29/25 1349 01/30/25 0437 01/30/25 1028 01/30/25 1724 01/31/25 0459  NA 134*  --  135  --   --  133*  K 4.4  --  3.6  --   --  3.3*  CL 101  --  102  --   --  102  CO2 20*  --  20*  --   --  22  GLUCOSE 134*  --  125*  --   --  117*  BUN 26*  --  21  --   --  21  CREATININE 0.85  --  0.83  --   --  0.92  CALCIUM  8.9  --  8.0*  --   --  7.8*  MG  --  2.4  --   --   --  2.3  PHOS  --  3.0 3.4 3.4 2.5 2.2*   Liver Function Tests: Recent Labs  Lab 01/29/25 1205  AST 44*  ALT 54*  ALKPHOS 86  BILITOT 0.5  PROT 6.3*  ALBUMIN 3.3*   Recent Labs  Lab 01/29/25 1205  LIPASE 11   No results for input(s): AMMONIA in the last 168 hours. Coagulation Profile: Recent Labs  Lab 01/29/25 1205  INR 1.5*   CBC: Recent Labs  Lab 01/29/25 1205 01/30/25 0437 01/31/25 0459  WBC 157.2* 144.8* 189.7*  NEUTROABS 1.6*  --  3.8  HGB 8.3* 7.4* 6.9*  HCT 25.8* 22.9* 21.5*  MCV 111.7* 112.3* 112.6*  PLT 14* 35* 20*   Cardiac Enzymes: No results for input(s): CKTOTAL, CKMB, CKMBINDEX, TROPONINI in the last 168 hours. BNP: Invalid input(s): POCBNP CBG: No results for input(s): GLUCAP in the last 168 hours. HbA1C: No results for input(s): HGBA1C in the last 72  hours. Urine analysis:    Component Value Date/Time   COLORURINE YELLOW 01/30/2025 1600   APPEARANCEUR HAZY (A) 01/30/2025 1600   APPEARANCEUR Clear 06/22/2023 1547   LABSPEC 1.020 01/30/2025 1600   PHURINE 6.0 01/30/2025 1600   GLUCOSEU NEGATIVE 01/30/2025 1600   HGBUR MODERATE (A) 01/30/2025 1600   BILIRUBINUR NEGATIVE 01/30/2025 1600   BILIRUBINUR Negative 06/22/2023 1547   KETONESUR 5 (A) 01/30/2025 1600   PROTEINUR 30 (A) 01/30/2025 1600   UROBILINOGEN 1.0 07/01/2011 1951   NITRITE NEGATIVE 01/30/2025 1600   LEUKOCYTESUR NEGATIVE 01/30/2025 1600   Sepsis Labs: @LABRCNTIP (procalcitonin:4,lacticidven:4) ) Recent  Results (from the past 240 hours)  Resp panel by RT-PCR (RSV, Flu A&B, Covid) Anterior Nasal Swab     Status: None   Collection Time: 01/29/25  3:35 PM   Specimen: Anterior Nasal Swab  Result Value Ref Range Status   SARS Coronavirus 2 by RT PCR NEGATIVE NEGATIVE Final    Comment: (NOTE) SARS-CoV-2 target nucleic acids are NOT DETECTED.  The SARS-CoV-2 RNA is generally detectable in upper respiratory specimens during the acute phase of infection. The lowest concentration of SARS-CoV-2 viral copies this assay can detect is 138 copies/mL. A negative result does not preclude SARS-Cov-2 infection and should not be used as the sole basis for treatment or other patient management decisions. A negative result may occur with  improper specimen collection/handling, submission of specimen other than nasopharyngeal swab, presence of viral mutation(s) within the areas targeted by this assay, and inadequate number of viral copies(<138 copies/mL). A negative result must be combined with clinical observations, patient history, and epidemiological information. The expected result is Negative.  Fact Sheet for Patients:  bloggercourse.com  Fact Sheet for Healthcare Providers:  seriousbroker.it  This test is no t yet approved  or cleared by the [place]  FDA and  has been authorized for detection and/or diagnosis of SARS-CoV-2 by FDA under an Emergency Use Authorization (EUA). This EUA will remain  in effect (meaning this test can be used) for the duration of the COVID-19 declaration under Section 564(b)(1) of the Act, 21 U.S.C.section 360bbb-3(b)(1), unless the authorization is terminated  or revoked sooner.       Influenza A by PCR NEGATIVE NEGATIVE Final   Influenza B by PCR NEGATIVE NEGATIVE Final    Comment: (NOTE) The Xpert Xpress SARS-CoV-2/FLU/RSV plus assay is intended as an aid in the diagnosis of influenza from Nasopharyngeal swab specimens and should not be used as a sole basis for treatment. Nasal washings and aspirates are unacceptable for Xpert Xpress SARS-CoV-2/FLU/RSV testing.  Fact Sheet for Patients: bloggercourse.com  Fact Sheet for Healthcare Providers: seriousbroker.it  This test is not yet approved or cleared by the [place]  FDA and has been authorized for detection and/or diagnosis of SARS-CoV-2 by FDA under an Emergency Use Authorization (EUA). This EUA will remain in effect (meaning this test can be used) for the duration of the COVID-19 declaration under Section 564(b)(1) of the Act, 21 U.S.C. section 360bbb-3(b)(1), unless the authorization is terminated or revoked.     Resp Syncytial Virus by PCR NEGATIVE NEGATIVE Final    Comment: (NOTE) Fact Sheet for Patients: bloggercourse.com  Fact Sheet for Healthcare Providers: seriousbroker.it  This test is not yet approved or cleared by the [place]  FDA and has been authorized for detection and/or diagnosis of SARS-CoV-2 by FDA under an Emergency Use Authorization (EUA). This EUA will remain in effect (meaning this test can be used) for the duration of the COVID-19 declaration under Section 564(b)(1) of the Act,  21 U.S.C. section 360bbb-3(b)(1), unless the authorization is terminated or revoked.  Performed at [facility], [address], [place], [place] [address]   MRSA Next Gen by PCR, Nasal     Status: None   Collection Time: 01/29/25  8:15 PM   Specimen: Nasal Mucosa; Nasal Swab  Result Value Ref Range Status   MRSA by PCR Next Gen NOT DETECTED NOT DETECTED Final    Comment: (NOTE) The GeneXpert MRSA Assay (FDA approved for NASAL specimens only), is one component of a comprehensive MRSA colonization surveillance program. It is not intended  to diagnose MRSA infection nor to guide or monitor treatment for MRSA infections. Test performance is not FDA approved in patients less than 42 years old. Performed at [facility], [address] [place], [place] [address]      Scheduled Meds:  sodium chloride    Intravenous Once   sodium chloride    Intravenous Once   allopurinol   50 mg Oral Daily   Chlorhexidine  Gluconate Cloth  6 each Topical Daily   doxycycline   100 mg Oral Q12H   furosemide   60 mg Intravenous Once   levothyroxine   88 mcg Oral Q0600   phosphorus  500 mg Oral BID   potassium chloride   40 mEq Oral Daily   Continuous Infusions:  cefTRIAXone  (ROCEPHIN )  IV Stopped (01/30/25 2317)    Procedures/Studies: ECHOCARDIOGRAM COMPLETE Result Date: 01/30/2025    ECHOCARDIOGRAM REPORT   Patient Name:   ARYIANNA EARWOOD Date of Exam: 01/30/2025 Medical Rec #:  [id number]       Height:       61.0 in Accession #:    [id number]      Weight:       112.9 lb Date of Birth:  Jul 23, 1930       BSA:          1.481 m Patient Age:    89 years        BP:           130/44 mmHg Patient Gender: F               HR:           67 bpm. Exam Location:  [facility] Procedure: 2D Echo, Color Doppler and Cardiac Doppler (Both Spectral and Color            Flow Doppler were utilized during procedure). Indications:    CHF-Acute Diastolic I50.31  History:        Patient has no prior history of Echocardiogram examinations.                  TIA; Arrythmias:Atrial Fibrillation.  Sonographer:    Sydnee Wilson RDCS Referring Phys: [phone] Apoorva Bugay IMPRESSIONS  1. Left ventricular ejection fraction, by estimation, is 60 to 65%. The left ventricle has normal function. The left ventricle has no regional wall motion abnormalities. Left ventricular diastolic parameters are consistent with Grade I diastolic dysfunction (impaired relaxation).  2. Right ventricular systolic function is normal. The right ventricular size is normal. There is normal pulmonary artery systolic pressure.  3. The mitral valve is normal in structure. Trivial mitral valve regurgitation. No evidence of mitral stenosis.  4. The aortic valve is tricuspid. Aortic valve regurgitation is not visualized. Mild aortic valve stenosis. Aortic valve area, by VTI measures 1.83 cm. Aortic valve mean gradient measures 9.0 mmHg. Aortic valve Vmax measures 2.00 m/s.  5. The inferior vena cava is normal in size with greater than 50% respiratory variability, suggesting right atrial pressure of 3 mmHg.  6. Increased flow velocities may be secondary to anemia, thyrotoxicosis, hyperdynamic or high flow state. Comparison(s): No prior Echocardiogram. FINDINGS  Left Ventricle: Left ventricular ejection fraction, by estimation, is 60 to 65%. The left ventricle has normal function. The left ventricle has no regional wall motion abnormalities. Strain was performed and the global longitudinal strain is indeterminate. The left ventricular internal cavity size was normal in size. There is no left ventricular hypertrophy. Left ventricular diastolic parameters are consistent with Grade I diastolic dysfunction (impaired relaxation). Normal left ventricular  filling pressure. Right Ventricle: The right ventricular size is normal. No increase in right ventricular wall thickness. Right ventricular systolic function is normal. There is normal pulmonary artery systolic pressure. The tricuspid regurgitant velocity is  2.37 m/s, and  with an assumed right atrial pressure of 3 mmHg, the estimated right ventricular systolic pressure is 25.5 mmHg. Left Atrium: Left atrial size was normal in size. Right Atrium: Right atrial size was normal in size. Pericardium: There is no evidence of pericardial effusion. Mitral Valve: The mitral valve is normal in structure. Trivial mitral valve regurgitation. No evidence of mitral valve stenosis. Tricuspid Valve: The tricuspid valve is normal in structure. Tricuspid valve regurgitation is trivial. No evidence of tricuspid stenosis. Aortic Valve: The aortic valve is tricuspid. Aortic valve regurgitation is not visualized. Mild aortic stenosis is present. Aortic valve mean gradient measures 9.0 mmHg. Aortic valve peak gradient measures 15.9 mmHg. Aortic valve area, by VTI measures 1.83 cm. Pulmonic Valve: The pulmonic valve was not well visualized. Pulmonic valve regurgitation is trivial. No evidence of pulmonic stenosis. Aorta: The aortic root and ascending aorta are structurally normal, with no evidence of dilitation. Venous: The inferior vena cava is normal in size with greater than 50% respiratory variability, suggesting right atrial pressure of 3 mmHg. IAS/Shunts: No atrial level shunt detected by color flow Doppler. Additional Comments: 3D was performed not requiring image post processing on an independent workstation and was indeterminate.  LEFT VENTRICLE PLAX 2D LVIDd:         4.40 cm     Diastology LVIDs:         2.80 cm     LV e' medial:    7.62 cm/s LV PW:         0.90 cm     LV E/e' medial:  14.2 LV IVS:        0.90 cm     LV e' lateral:   8.49 cm/s LVOT diam:     2.00 cm     LV E/e' lateral: 12.7 LV SV:         91 LV SV Index:   62 LVOT Area:     3.14 cm  LV Volumes (MOD) LV vol d, MOD A2C: 67.8 ml LV vol d, MOD A4C: 66.6 ml LV vol s, MOD A2C: 18.4 ml LV vol s, MOD A4C: 27.4 ml LV SV MOD A2C:     49.4 ml LV SV MOD A4C:     66.6 ml LV SV MOD BP:      45.6 ml RIGHT VENTRICLE RV S prime:      13.10 cm/s TAPSE (M-mode): 1.8 cm LEFT ATRIUM             Index        RIGHT ATRIUM          Index LA diam:        3.20 cm 2.16 cm/m   RA Area:     9.80 cm LA Vol (A2C):   16.9 ml 11.41 ml/m  RA Volume:   19.00 ml 12.83 ml/m LA Vol (A4C):   28.5 ml 19.24 ml/m LA Biplane Vol: 23.6 ml 15.93 ml/m  AORTIC VALVE AV Area (Vmax):    1.64 cm AV Area (Vmean):   1.58 cm AV Area (VTI):     1.83 cm AV Vmax:           199.60 cm/s AV Vmean:          141.400 cm/s AV VTI:  0.499 m AV Peak Grad:      15.9 mmHg AV Mean Grad:      9.0 mmHg LVOT Vmax:         104.32 cm/s LVOT Vmean:        71.300 cm/s LVOT VTI:          0.291 m LVOT/AV VTI ratio: 0.58  AORTA Ao Root diam: 3.20 cm Ao Asc diam:  2.70 cm MITRAL VALVE                TRICUSPID VALVE MV Area (PHT): 2.77 cm     TR Peak grad:   22.5 mmHg MV Decel Time: 274 msec     TR Vmax:        237.00 cm/s MV E velocity: 108.00 cm/s MV A velocity: 133.00 cm/s  SHUNTS MV E/A ratio:  0.81         Systemic VTI:  0.29 m                             Systemic Diam: 2.00 cm Vishnu Priya Mallipeddi Electronically signed by Diannah Late Mallipeddi Signature Date/Time: 01/30/2025/4:31:19 PM    Final    CT Soft Tissue Neck Wo Contrast Result Date: 01/29/2025 EXAM: CT NECK WITHOUT CONTRAST 01/29/2025 04:06:28 PM TECHNIQUE: CT of the neck was performed without the administration of intravenous contrast. Multiplanar reformatted images are provided for review. Automated exposure control, iterative reconstruction, and/or weight based adjustment of the mA/kV was utilized to reduce the radiation dose to as low as reasonably achievable. COMPARISON: None available. CLINICAL HISTORY: Swelling right side of neck, leukocytosis. Swelling on the right side of the neck; leukocytosis. FINDINGS: AERODIGESTIVE TRACT: The nasopharynx is symmetric. Symmetric appearance of the oropharynx and tonsils. Normal appearance of the oral cavity and floor of mouth. Normal appearance of the epiglottis. The  retropharynx is unremarkable. Symmetric appearance of the supraglottic airway. The vocal folds are symmetric. No discrete mass. No edema. SALIVARY GLANDS: The parotid and submandibular glands are unremarkable. THYROID : Unremarkable. LYMPH NODES: No suspicious cervical lymphadenopathy. No lymphadenopathy appreciated. SOFT TISSUES: There is no focal inflammatory change visualized within the soft tissues in the right aspect of the neck. No asymmetric soft tissue swelling or fluid collection appreciated. There is atherosclerosis of the right carotid bifurcation likely resulting in at least mild stenosis although evaluation is limited by lack of IV contrast. No mass. BONES: Degenerative changes in the visualized spine with disc space narrowing greatest at C4-C5. Edentulous maxilla. Degenerative changes of the left temporomandibular joint. OTHER: Visualized sinuses and mastoid air cells are well aerated. Partially visualized left pleural effusion. There is atherosclerosis noted along the aortic arch. There are patchy opacities in the left upper lobe better characterized on the same day CT chest. IMPRESSION: 1. No acute findings in the right side of the neck related to the reported swelling and leukocytosis. 2. Partially visualized left pleural effusion and patchy left upper lobe opacities, better characterized on the same day CT chest. Electronically signed by: Donnice Mania MD 01/29/2025 04:24 PM EST RP Workstation: [id number]   CT CHEST ABDOMEN PELVIS WO CONTRAST Result Date: 01/29/2025 CLINICAL DATA:  Leukocytosis, dyspnea, weakness and nausea for 1 week EXAM: CT CHEST, ABDOMEN AND PELVIS WITHOUT CONTRAST TECHNIQUE: Multidetector CT imaging of the chest, abdomen and pelvis was performed following the standard protocol without IV contrast. Examination was performed without contrast at the request of the ordering clinician. Assessment of the vascular structures,  soft tissues, and solid viscera is severely limited.  RADIATION DOSE REDUCTION: This exam was performed according to the departmental dose-optimization program which includes automated exposure control, adjustment of the mA and/or kV according to patient size and/or use of iterative reconstruction technique. COMPARISON:  08/08/2024 FINDINGS: CT CHEST FINDINGS Cardiovascular: Unenhanced imaging of the heart demonstrates borderline cardiomegaly. Decreased attenuation within the cardiac chambers may signify underlying anemia. Calcifications of the mitral and aortic valves. Normal caliber of the thoracic aorta. Atherosclerosis of the aorta and coronary vasculature. Assessment of the vascular lumen cannot be performed without intravenous contrast. Mediastinum/Nodes: No enlarged mediastinal, hilar, or axillary lymph nodes. Thyroid  gland, trachea, and esophagus demonstrate no significant findings. Lungs/Pleura: There are small bilateral pleural effusions, left greater than right. Bilateral lower lobe atelectasis, greatest within the left lower lobe. There is patchy left upper lobe airspace disease concerning for bronchopneumonia. No pneumothorax. The central airways are patent. Musculoskeletal: No acute or destructive bony abnormalities. Reconstructed images demonstrate no additional findings. CT ABDOMEN PELVIS FINDINGS Hepatobiliary: Increased liver attenuation compatible with chronic amiodarone  therapy. Prior cholecystectomy. No biliary duct dilation. Pancreas: Unremarkable unenhanced appearance. Evaluation limited by respiratory motion. Spleen: Unremarkable unenhanced appearance. Adrenals/Urinary Tract: No urinary tract calculi or obstructive uropathy within either kidney. The adrenals and bladder are unremarkable. Stomach/Bowel: No bowel obstruction or ileus. Normal appendix right lower quadrant. Distal colonic diverticulosis without diverticulitis. No bowel wall thickening or inflammatory change. Moderate stool throughout the distal colon. Vascular/Lymphatic: Aortic  atherosclerosis. A central mesenteric nodule containing coarse central calcification measures 1.5 by 1.4 cm reference image 81/2, stable since prior study. This may reflect a calcified mesenteric lymph node. No other pathologic adenopathy is identified. Reproductive: Uterus and bilateral adnexa are unremarkable. Other: Trace pelvic free fluid. No free intraperitoneal gas. No abdominal wall hernia. Musculoskeletal: No acute or destructive bony abnormalities. Reconstructed images demonstrate no additional findings. IMPRESSION: 1. Patchy left upper lobe airspace disease consistent with bronchopneumonia. 2. Small bilateral pleural effusions, left greater than right. 3. Cardiomegaly. Decreased attenuation within the cardiac chambers consistent with anemia. 4. Stable 1.5 cm mesenteric nodule with central coarse calcification, likely calcified lymph node. No other pathologic adenopathy. 5. Distal colonic diverticulosis without diverticulitis. 6.  Aortic Atherosclerosis (ICD10-I70.0). Electronically Signed   By: Ozell Daring M.D.   On: 01/29/2025 16:21    Alm Schneider, DO  [facility]  If 7PM-7AM, please contact night-coverage [email or web address] Password [email or web address] 01/31/2025, 1:57 PM   LOS: 2 days   "

## 2025-01-31 NOTE — Progress Notes (Signed)
 Oncology Progress Note  I saw the patient at bedside today along with her family members.  I discussed with her that the flow cytometry results are consistent with acute myeloid leukemia.  She would need bone marrow biopsy for confirmation of diagnosis and further management.  Patient had an extensive conversation with her family members and palliative care team and decided to proceed with hospice at this time.  I think this is a reasonable addition considering her age and comorbidities and with poor prognosis with acute leukemia.  All the questions and concerns were answered in detail.  Mickiel Dry, MD Hematology/Oncology [facility]

## 2025-01-31 NOTE — Progress Notes (Addendum)
@  [id number], on-call for attending, text-paged via AMION pt's critical lab values: Hgb 6.9, WBCs 189.7, and Platelets 20.  Page promptly returned and orders received for transfusion. Day RN updated.

## 2025-01-31 NOTE — Progress Notes (Signed)
 PT Cancellation Note  Patient Details Name: REMELL GIAIMO MRN: [id number] DOB: 11-16-1930   Cancelled Treatment:    Reason Eval/Treat Not Completed: Medical issues which prohibited therapy. Patient receiving blood transfusion.  Will check back tomorrow.   3:44 PM, 01/31/25 Lynwood Music, MPT Physical Therapist with [facility] 336 [phone] office [phone] mobile phone

## 2025-02-01 LAB — BPAM PLATELET PHERESIS
Blood Product Expiration Date: 202602062359
Blood Product Expiration Date: 202602082359
ISSUE DATE / TIME: 202602051421
ISSUE DATE / TIME: 202602052318
Unit Type and Rh: 5100
Unit Type and Rh: 5100

## 2025-02-01 LAB — CBC
HCT: 30.6 % — ABNORMAL LOW (ref 36.0–46.0)
Hemoglobin: 10.1 g/dL — ABNORMAL LOW (ref 12.0–15.0)
MCH: 32.2 pg (ref 26.0–34.0)
MCHC: 33 g/dL (ref 30.0–36.0)
MCV: 97.5 fL (ref 80.0–100.0)
Platelets: 70 10*3/uL — ABNORMAL LOW (ref 150–400)
RBC: 3.14 MIL/uL — ABNORMAL LOW (ref 3.87–5.11)
RDW: 25.7 % — ABNORMAL HIGH (ref 11.5–15.5)
WBC: 223.7 10*3/uL (ref 4.0–10.5)
nRBC: 0.1 % (ref 0.0–0.2)

## 2025-02-01 LAB — BASIC METABOLIC PANEL WITH GFR
Anion gap: 12 (ref 5–15)
BUN: 17 mg/dL (ref 8–23)
CO2: 23 mmol/L (ref 22–32)
Calcium: 7.9 mg/dL — ABNORMAL LOW (ref 8.9–10.3)
Chloride: 101 mmol/L (ref 98–111)
Creatinine, Ser: 0.86 mg/dL (ref 0.44–1.00)
GFR, Estimated: >60 mL/min
Glucose, Bld: 125 mg/dL — ABNORMAL HIGH (ref 70–99)
Potassium: 3.3 mmol/L — ABNORMAL LOW (ref 3.5–5.1)
Sodium: 136 mmol/L (ref 135–145)

## 2025-02-01 LAB — MAGNESIUM: Magnesium: 2.1 mg/dL (ref 1.7–2.4)

## 2025-02-01 LAB — PREPARE PLATELET PHERESIS
Unit division: 0
Unit division: 0

## 2025-02-01 LAB — TYPE AND SCREEN
ABO/RH(D): AB POS
Antibody Screen: NEGATIVE
Unit division: 0
Unit division: 0

## 2025-02-01 LAB — BPAM RBC
Blood Product Expiration Date: 202602092359
Blood Product Expiration Date: 202603012359
ISSUE DATE / TIME: 202602050755
ISSUE DATE / TIME: 202602051748
Unit Type and Rh: 1700
Unit Type and Rh: 7300

## 2025-02-01 LAB — FLOW CYTOMETRY

## 2025-02-01 LAB — URIC ACID: Uric Acid, Serum: 2 mg/dL — ABNORMAL LOW (ref 2.5–7.1)

## 2025-02-01 LAB — LACTATE DEHYDROGENASE: LDH: 461 U/L — ABNORMAL HIGH (ref 105–235)

## 2025-02-01 LAB — PHOSPHORUS: Phosphorus: 3.2 mg/dL (ref 2.5–4.6)

## 2025-02-01 MED ORDER — FUROSEMIDE 40 MG PO TABS
40.0000 mg | ORAL_TABLET | Freq: Once | ORAL | Status: AC
  Administered 2025-02-01: 40 mg via ORAL
  Filled 2025-02-01: qty 1

## 2025-02-01 MED ORDER — SENNA 8.6 MG PO TABS: 2.0000 | ORAL_TABLET | Freq: Every evening | ORAL | Status: AC | PRN

## 2025-02-01 MED ORDER — CEFDINIR 300 MG PO CAPS: 300.0000 mg | ORAL_CAPSULE | Freq: Two times a day (BID) | ORAL | 0 refills | Status: AC

## 2025-02-01 MED ORDER — DOXYCYCLINE HYCLATE 100 MG PO TABS: 100.0000 mg | ORAL_TABLET | Freq: Two times a day (BID) | ORAL | 0 refills | Status: AC

## 2025-02-01 MED ORDER — POTASSIUM CHLORIDE CRYS ER 20 MEQ PO TBCR: 40.0000 meq | EXTENDED_RELEASE_TABLET | Freq: Once | ORAL | Status: DC

## 2025-02-01 MED ORDER — ALPRAZOLAM 0.25 MG PO TABS: 0.2500 mg | ORAL_TABLET | Freq: Three times a day (TID) | ORAL | 0 refills | Status: AC | PRN

## 2025-02-01 MED ORDER — POTASSIUM CHLORIDE CRYS ER 20 MEQ PO TBCR
20.0000 meq | EXTENDED_RELEASE_TABLET | Freq: Once | ORAL | Status: AC
  Administered 2025-02-01: 20 meq via ORAL
  Filled 2025-02-01: qty 1

## 2025-02-01 NOTE — Progress Notes (Signed)
 Patient d/c home with hospice, transported by EMS. Family at bedside at time of d/c.  Ashante Snelling, Cena Helling, RN

## 2025-02-01 NOTE — Care Management Important Message (Signed)
 Important Message  Patient Details  Name: Erica [name] MRN: [id number] Date of Birth: 1930-08-11   Important Message Given:  Other (see comment) (patient going home with hospice care)     [name] Ada 02/01/2025, 12:05 PM

## 2025-02-01 NOTE — Plan of Care (Signed)

## 2025-02-01 NOTE — TOC Transition Note (Signed)
 Transition of Care [facility]) - Discharge Note   Patient Details  Name: Erica [name] MRN: [id number] Date of Birth: 09-30-1930  [facility] CM/SW Contact:  Sharlyne Stabs, RN Phone Number: 02/01/2025, 12:47 PM   Clinical Narrative:   Patient is medically ready to discharge home with [facility]. CM confirmed DME and home oxygen has been delivered. EMS scheduled to transport patient home.     Final next level of care: Home w Hospice Care Barriers to Discharge: Barriers Resolved   Patient Goals and CMS Choice Patient states their goals for this hospitalization and ongoing recovery are:: Home with hospice CMS Medicare.gov Compare Post Acute Care list provided to:: Patient Represenative (must comment) Choice offered to / list presented to : Adult Children [place] ownership interest in [facility].provided to:: Adult Children    Discharge Placement                Patient to be transferred to facility by: EMS Name of family member notified: Family at bedside Patient and family notified of of transfer: 02/01/25  Discharge Plan and Services Additional resources added to the After Visit Summary for   In-house Referral: Clinical Social Work Discharge Planning Services: CM Consult Post Acute Care Choice: Hospice                      HH Agency: [facility]        Social Drivers of Health (SDOH) Interventions SDOH Screenings   Food Insecurity: No Food Insecurity (01/29/2025)  Housing: Low Risk (01/29/2025)  Transportation Needs: No Transportation Needs (01/29/2025)  Utilities: Not At Risk (01/29/2025)  Alcohol Screen: Low Risk (02/27/2024)  Depression (PHQ2-9): Low Risk (12/10/2024)  Financial Resource Strain: Low Risk (02/27/2024)  Physical Activity: Insufficiently Active (02/27/2024)  Social Connections: Moderately Isolated (01/29/2025)  Stress: No Stress Concern Present (02/27/2024)  Tobacco Use: Low Risk (01/30/2025)  Health Literacy: Adequate  Health Literacy (02/27/2024)     Readmission Risk Interventions    01/31/2025    2:15 PM  Readmission Risk Prevention Plan  Medication Screening Complete  Transportation Screening Complete

## 2025-02-01 NOTE — Progress Notes (Signed)
 PT Cancellation Note  Patient Details Name: Erica [name] MRN: [id number] DOB: 1930/02/19   Cancelled Treatment:    Reason Eval/Treat Not Completed: Other (comment);PT screened, no needs identified, will sign off Patient and family are deciding to go home with hospice care. PT will sign off at this time.  9:51 AM, 02/01/25 Rosaria Settler, PT, DPT [place] with [facility]

## 2025-02-01 NOTE — Plan of Care (Signed)

## 2025-02-01 NOTE — ED Provider Notes (Cosign Needed)
 " [facility] MEDICAL SURGICAL UNIT Provider Note   CSN: [id number] Arrival date & time: 01/29/25  1142     Patient presents with: Weakness and Nausea   Erica [name] is a 89 y.o. female.   Patient is a 89 year old female who presents to the emergency department with a chief complaint of generalized weakness, nausea, vomiting, sinus congestion which has been ongoing for approximate the past 4 days.  Patient does note that she feels as though she has been having intermittent fevers.  She denies any dizziness, lightheadedness or syncope.  She has had no associated chest pain or shortness of breath.  She does admit to some associated abdominal discomfort in the epigastric region.  She has had no abnormal bruising or lower extremity edema.  She denies any other known sick exposures.   Weakness      Prior to Admission medications  Medication Sig Start Date End Date Taking? Authorizing Provider  alendronate  (FOSAMAX ) 70 MG tablet TAKE ONE TABLET ONCE A WEEK ON AN EMPTY STOMACH WITH full GLASS of water **NEEDS TO BE SEEN BEFORE NEXT REFILL** 10/24/24   Gladis, Mary-Margaret, [name]  amiodarone  (PACERONE ) 200 MG tablet Take 1 tablet (200 mg total) by mouth daily. 12/10/24   Gladis, Mary-Margaret, [name]  amLODipine  (NORVASC ) 5 MG tablet Take 1 tablet (5 mg total) by mouth 2 (two) times daily. 12/10/24   Gladis, Mary-Margaret, [name]  apixaban  (ELIQUIS ) 2.5 MG TABS tablet Take 1 tablet (2.5 mg total) by mouth 2 (two) times daily. 12/10/24   Gladis, Mary-Margaret, [name]  atorvastatin  (LIPITOR) 40 MG tablet Take 1 tablet (40 mg total) by mouth daily. 12/10/24   Gladis, Mary-Margaret, [name]  Cholecalciferol (VITAMIN D3) 125 MCG (5000 UT) CAPS Take 1 capsule by mouth every other day.    [provider]  escitalopram  (LEXAPRO ) 20 MG tablet Take 2 tablets (40 mg total) by mouth daily. 12/10/24   Gladis Mustard, [name]  esomeprazole  (NEXIUM ) 40 MG capsule TAKE ONE CAPSULE TWICE DAILY BEFORE MEALS  12/10/24   Gladis, Mary-Margaret, [name]  fluticasone  (FLONASE ) 50 MCG/ACT nasal spray Place 2 sprays into both nostrils daily. 12/10/24   Gladis Mary-Margaret, [name]  furosemide  (LASIX ) 20 MG tablet Take 1 tablet (20 mg total) by mouth daily. 12/10/24   Gladis Mary-Margaret, [name]  hydrocortisone  (ANUSOL -HC) 2.5 % rectal cream Place 1 application rectally 2 (two) times daily. Patient taking differently: Place 1 application  rectally 2 (two) times daily as needed for hemorrhoids or anal itching. 04/08/20   Avram Lupita BRAVO, MD  levothyroxine  (SYNTHROID ) 88 MCG tablet TAKE ONE TABLET DAILY BEFORE BREAKFAST 12/10/24   Gladis Mustard, [name]  Multiple Vitamin (MULTIVITAMIN WITH MINERALS) TABS tablet Take 1 tablet by mouth daily.    [provider]  olmesartan  (BENICAR ) 40 MG tablet Take 1 tablet (40 mg total) by mouth daily. 12/10/24   Gladis, Mary-Margaret, [name]  ondansetron  (ZOFRAN -ODT) 4 MG disintegrating tablet Take 4 mg by mouth every 8 (eight) hours as needed. 09/27/24   [provider]  Pediatric Multiple Vitamins (FLINTSTONES PLUS EXTRA C) CHEW Chew by mouth.    [provider]  polyethylene glycol (MIRALAX  / GLYCOLAX ) 17 g packet Take 17 g by mouth daily.    [provider]  Prucalopride Succinate  (MOTEGRITY ) 2 MG TABS Take 1 tablet (2 mg total) by mouth daily. 11/06/24   Mahon, Charmaine CROME, NP  RESTASIS 0.05 % ophthalmic emulsion Place 1 drop into both eyes 2 (two) times daily. 04/28/24   [provider]  sucralfate  (CARAFATE ) 1 g tablet TAKE ONE TABLET FOUR TIMES DAILY BEFORE MEALS AND AT BEDTIME 11/13/24   Mahon, Charmaine CROME, NP  Wheat Dextrin (BENEFIBER) POWD Take 1 Dose by mouth daily. Take daily    [provider]    Allergies: Contrast media [iodinated contrast media], Iodine, Iohexol , Ioxaglate, Bactrim  [sulfamethoxazole -trimethoprim ], and Metoclopramide     Review of Systems  Neurological:  Positive for weakness.  All other systems  reviewed and are negative.   Updated Vital Signs BP (!) 139/58 (BP Location: Right Arm)   Pulse 65   Temp 98.6 F (37 C) (Oral)   Resp 17   Ht 5' 1 (1.549 m)   Wt 48.3 kg   SpO2 92%   BMI 20.12 kg/m   Physical Exam Vitals and nursing note reviewed.  Constitutional:      General: She is not in acute distress.    Appearance: Normal appearance. She is not ill-appearing.  HENT:     Head: Normocephalic and atraumatic.     Nose: Nose normal.     Mouth/Throat:     Mouth: Mucous membranes are moist.  Eyes:     Extraocular Movements: Extraocular movements intact.     Conjunctiva/sclera: Conjunctivae normal.     Pupils: Pupils are equal, round, and reactive to light.  Cardiovascular:     Rate and Rhythm: Normal rate and regular rhythm.     Pulses: Normal pulses.     Heart sounds: Normal heart sounds. No murmur heard.    No gallop.  Pulmonary:     Effort: Pulmonary effort is normal. No respiratory distress.     Breath sounds: Normal breath sounds. No stridor. No wheezing, rhonchi or rales.  Abdominal:     General: Abdomen is flat. Bowel sounds are normal. There is no distension.     Palpations: Abdomen is soft.     Tenderness: There is no guarding.     Comments: Diffuse abdominal tenderness  Musculoskeletal:        General: No swelling or deformity. Normal range of motion.     Cervical back: Normal range of motion and neck supple. No rigidity or tenderness.  Skin:    General: Skin is warm and dry.     Findings: No bruising or rash.  Neurological:     General: No focal deficit present.     Mental Status: She is alert and oriented to person, place, and time. Mental status is at baseline.     Cranial Nerves: No cranial nerve deficit.     Sensory: No sensory deficit.     Motor: No weakness.     Coordination: Coordination normal.     Gait: Gait normal.  Psychiatric:        Mood and Affect: Mood normal.        Behavior: Behavior normal.        Thought Content: Thought  content normal.        Judgment: Judgment normal.     (all labs ordered are listed, but only abnormal results are displayed) Labs Reviewed  COMPREHENSIVE METABOLIC PANEL WITH GFR - Abnormal; Notable for the following components:      Result Value   Sodium 134 (*)    CO2 20 (*)    Glucose, Bld 134 (*)    BUN 26 (*)    Total Protein 6.3 (*)    Albumin 3.3 (*)    AST 44 (*)    ALT 54 (*)    All other components within  normal limits  CBC - Abnormal; Notable for the following components:   WBC 157.2 (*)    RBC 2.31 (*)    Hemoglobin 8.3 (*)    HCT 25.8 (*)    MCV 111.7 (*)    MCH 35.9 (*)    RDW 18.2 (*)    Platelets 14 (*)    All other components within normal limits  DIFFERENTIAL - Abnormal; Notable for the following components:   Neutro Abs 1.6 (*)    Monocytes Absolute 3.1 (*)    All other components within normal limits  PROTIME-INR - Abnormal; Notable for the following components:   Prothrombin Time 18.5 (*)    INR 1.5 (*)    All other components within normal limits  LACTATE DEHYDROGENASE - Abnormal; Notable for the following components:   LDH 343 (*)    All other components within normal limits  CBC - Abnormal; Notable for the following components:   WBC 144.8 (*)    RBC 2.04 (*)    Hemoglobin 7.4 (*)    HCT 22.9 (*)    MCV 112.3 (*)    MCH 36.3 (*)    RDW 18.2 (*)    Platelets 35 (*)    All other components within normal limits  URIC ACID - Abnormal; Notable for the following components:   Uric Acid, Serum 2.1 (*)    All other components within normal limits  URIC ACID - Abnormal; Notable for the following components:   Uric Acid, Serum 2.1 (*)    All other components within normal limits  LACTATE DEHYDROGENASE - Abnormal; Notable for the following components:   LDH 349 (*)    All other components within normal limits  LACTATE DEHYDROGENASE - Abnormal; Notable for the following components:   LDH 362 (*)    All other components within normal limits   BASIC METABOLIC PANEL WITH GFR - Abnormal; Notable for the following components:   CO2 20 (*)    Glucose, Bld 125 (*)    Calcium  8.0 (*)    All other components within normal limits  URIC ACID - Abnormal; Notable for the following components:   Uric Acid, Serum 1.9 (*)    All other components within normal limits  LACTATE DEHYDROGENASE - Abnormal; Notable for the following components:   LDH 365 (*)    All other components within normal limits  PRO BRAIN NATRIURETIC PEPTIDE - Abnormal; Notable for the following components:   Pro Brain Natriuretic Peptide 5,099.0 (*)    All other components within normal limits  URINALYSIS, W/ REFLEX TO CULTURE (INFECTION SUSPECTED) - Abnormal; Notable for the following components:   APPearance HAZY (*)    Hgb urine dipstick MODERATE (*)    Ketones, ur 5 (*)    Protein, ur 30 (*)    All other components within normal limits  CBC WITH DIFFERENTIAL/PLATELET - Abnormal; Notable for the following components:   WBC 189.7 (*)    RBC 1.91 (*)    Hemoglobin 6.9 (*)    HCT 21.5 (*)    MCV 112.6 (*)    MCH 36.1 (*)    RDW 18.7 (*)    Platelets 20 (*)    Lymphs Abs 5.7 (*)    Monocytes Absolute 0.0 (*)    All other components within normal limits  BASIC METABOLIC PANEL WITH GFR - Abnormal; Notable for the following components:   Sodium 133 (*)    Potassium 3.3 (*)    Glucose, Bld 117 (*)  Calcium  7.8 (*)    GFR, Estimated 58 (*)    All other components within normal limits  URIC ACID - Abnormal; Notable for the following components:   Uric Acid, Serum 2.3 (*)    All other components within normal limits  LACTATE DEHYDROGENASE - Abnormal; Notable for the following components:   LDH 334 (*)    All other components within normal limits  PHOSPHORUS - Abnormal; Notable for the following components:   Phosphorus 2.2 (*)    All other components within normal limits  BASIC METABOLIC PANEL WITH GFR - Abnormal; Notable for the following components:    Potassium 3.3 (*)    Glucose, Bld 125 (*)    Calcium  7.9 (*)    All other components within normal limits  CBC - Abnormal; Notable for the following components:   WBC 223.7 (*)    RBC 3.14 (*)    Hemoglobin 10.1 (*)    HCT 30.6 (*)    RDW 25.7 (*)    Platelets 70 (*)    All other components within normal limits  POC OCCULT BLOOD, ED - Abnormal; Notable for the following components:   Fecal Occult Bld POSITIVE (*)    All other components within normal limits  RESP PANEL BY RT-PCR (RSV, FLU A&B, COVID)  RVPGX2  MRSA NEXT GEN BY PCR, NASAL  LIPASE, BLOOD  PHOSPHORUS  MAGNESIUM   URIC ACID  TECHNOLOGIST SMEAR REVIEW  APTT  PATHOLOGIST SMEAR REVIEW  PHOSPHORUS  PHOSPHORUS  PHOSPHORUS  PROCALCITONIN  VITAMIN B12  FOLATE  TSH  MAGNESIUM   MAGNESIUM   FLOW CYTOMETRY  URIC ACID  LACTATE DEHYDROGENASE  PHOSPHORUS  URIC ACID  LACTATE DEHYDROGENASE  PHOSPHORUS  TYPE AND SCREEN  PREPARE PLATELET PHERESIS  ABO/RH  PREPARE RBC (CROSSMATCH)  PREPARE PLATELET PHERESIS  BLOOD TRANSFUSION REPORT - SCANNED   Narrative:    Ordered by an unspecified provider.  SURGICAL PATHOLOGY    EKG: None  Radiology: ECHOCARDIOGRAM COMPLETE Result Date: 01/30/2025    ECHOCARDIOGRAM REPORT   Patient Name:   Erica [name] Date of Exam: 01/30/2025 Medical Rec #:  [id number]       Height:       61.0 in Accession #:    [id number]      Weight:       112.9 lb Date of Birth:  1930-08-02       BSA:          1.481 m Patient Age:    89 years        BP:           130/44 mmHg Patient Gender: F               HR:           67 bpm. Exam Location:  [facility] Procedure: 2D Echo, Color Doppler and Cardiac Doppler (Both Spectral and Color            Flow Doppler were utilized during procedure). Indications:    CHF-Acute Diastolic I50.31  History:        Patient has no prior history of Echocardiogram examinations.                 TIA; Arrythmias:Atrial Fibrillation.  Sonographer:    Sydnee Wilson RDCS Referring Phys:  [phone] DAVID TAT IMPRESSIONS  1. Left ventricular ejection fraction, by estimation, is 60 to 65%. The left ventricle has normal function. The left ventricle has no regional wall motion abnormalities. Left ventricular diastolic  parameters are consistent with Grade I diastolic dysfunction (impaired relaxation).  2. Right ventricular systolic function is normal. The right ventricular size is normal. There is normal pulmonary artery systolic pressure.  3. The mitral valve is normal in structure. Trivial mitral valve regurgitation. No evidence of mitral stenosis.  4. The aortic valve is tricuspid. Aortic valve regurgitation is not visualized. Mild aortic valve stenosis. Aortic valve area, by VTI measures 1.83 cm. Aortic valve mean gradient measures 9.0 mmHg. Aortic valve Vmax measures 2.00 m/s.  5. The inferior vena cava is normal in size with greater than 50% respiratory variability, suggesting right atrial pressure of 3 mmHg.  6. Increased flow velocities may be secondary to anemia, thyrotoxicosis, hyperdynamic or high flow state. Comparison(s): No prior Echocardiogram. FINDINGS  Left Ventricle: Left ventricular ejection fraction, by estimation, is 60 to 65%. The left ventricle has normal function. The left ventricle has no regional wall motion abnormalities. Strain was performed and the global longitudinal strain is indeterminate. The left ventricular internal cavity size was normal in size. There is no left ventricular hypertrophy. Left ventricular diastolic parameters are consistent with Grade I diastolic dysfunction (impaired relaxation). Normal left ventricular filling pressure. Right Ventricle: The right ventricular size is normal. No increase in right ventricular wall thickness. Right ventricular systolic function is normal. There is normal pulmonary artery systolic pressure. The tricuspid regurgitant velocity is 2.37 m/s, and  with an assumed right atrial pressure of 3 mmHg, the estimated right ventricular  systolic pressure is 25.5 mmHg. Left Atrium: Left atrial size was normal in size. Right Atrium: Right atrial size was normal in size. Pericardium: There is no evidence of pericardial effusion. Mitral Valve: The mitral valve is normal in structure. Trivial mitral valve regurgitation. No evidence of mitral valve stenosis. Tricuspid Valve: The tricuspid valve is normal in structure. Tricuspid valve regurgitation is trivial. No evidence of tricuspid stenosis. Aortic Valve: The aortic valve is tricuspid. Aortic valve regurgitation is not visualized. Mild aortic stenosis is present. Aortic valve mean gradient measures 9.0 mmHg. Aortic valve peak gradient measures 15.9 mmHg. Aortic valve area, by VTI measures 1.83 cm. Pulmonic Valve: The pulmonic valve was not well visualized. Pulmonic valve regurgitation is trivial. No evidence of pulmonic stenosis. Aorta: The aortic root and ascending aorta are structurally normal, with no evidence of dilitation. Venous: The inferior vena cava is normal in size with greater than 50% respiratory variability, suggesting right atrial pressure of 3 mmHg. IAS/Shunts: No atrial level shunt detected by color flow Doppler. Additional Comments: 3D was performed not requiring image post processing on an independent workstation and was indeterminate.  LEFT VENTRICLE PLAX 2D LVIDd:         4.40 cm     Diastology LVIDs:         2.80 cm     LV e' medial:    7.62 cm/s LV PW:         0.90 cm     LV E/e' medial:  14.2 LV IVS:        0.90 cm     LV e' lateral:   8.49 cm/s LVOT diam:     2.00 cm     LV E/e' lateral: 12.7 LV SV:         91 LV SV Index:   62 LVOT Area:     3.14 cm  LV Volumes (MOD) LV vol d, MOD A2C: 67.8 ml LV vol d, MOD A4C: 66.6 ml LV vol s, MOD A2C: 18.4 ml LV  vol s, MOD A4C: 27.4 ml LV SV MOD A2C:     49.4 ml LV SV MOD A4C:     66.6 ml LV SV MOD BP:      45.6 ml RIGHT VENTRICLE RV S prime:     13.10 cm/s TAPSE (M-mode): 1.8 cm LEFT ATRIUM             Index        RIGHT ATRIUM           Index LA diam:        3.20 cm 2.16 cm/m   RA Area:     9.80 cm LA Vol (A2C):   16.9 ml 11.41 ml/m  RA Volume:   19.00 ml 12.83 ml/m LA Vol (A4C):   28.5 ml 19.24 ml/m LA Biplane Vol: 23.6 ml 15.93 ml/m  AORTIC VALVE AV Area (Vmax):    1.64 cm AV Area (Vmean):   1.58 cm AV Area (VTI):     1.83 cm AV Vmax:           199.60 cm/s AV Vmean:          141.400 cm/s AV VTI:            0.499 m AV Peak Grad:      15.9 mmHg AV Mean Grad:      9.0 mmHg LVOT Vmax:         104.32 cm/s LVOT Vmean:        71.300 cm/s LVOT VTI:          0.291 m LVOT/AV VTI ratio: 0.58  AORTA Ao Root diam: 3.20 cm Ao Asc diam:  2.70 cm MITRAL VALVE                TRICUSPID VALVE MV Area (PHT): 2.77 cm     TR Peak grad:   22.5 mmHg MV Decel Time: 274 msec     TR Vmax:        237.00 cm/s MV E velocity: 108.00 cm/s MV A velocity: 133.00 cm/s  SHUNTS MV E/A ratio:  0.81         Systemic VTI:  0.29 m                             Systemic Diam: 2.00 cm Vishnu Priya Mallipeddi Electronically signed by Diannah Late Mallipeddi Signature Date/Time: 01/30/2025/4:31:19 PM    Final      .Critical Care  Performed by: Daralene Lonni BIRCH, PA-C Authorized by: Daralene Lonni BIRCH, PA-C   Critical care provider statement:    Critical care time (minutes):  35   Critical care was necessary to treat or prevent imminent or life-threatening deterioration of the following conditions: Acute leukemia.   Critical care was time spent personally by me on the following activities:  Development of treatment plan with patient or surrogate, discussions with consultants, evaluation of patient's response to treatment, examination of patient, ordering and review of laboratory studies, ordering and review of radiographic studies, ordering and performing treatments and interventions, pulse oximetry, re-evaluation of patient's condition and review of old charts   I assumed direction of critical care for this patient from another provider in my specialty: no     Care  discussed with: admitting provider      Medications Ordered in the ED  Chlorhexidine  Gluconate Cloth 2 % PADS 6 each (6 each Topical Given 01/31/25 1056)  allopurinol  (ZYLOPRIM ) tablet 50 mg (50 mg  Oral Given 01/31/25 1055)  levothyroxine  (SYNTHROID ) tablet 88 mcg (88 mcg Oral Given 02/01/25 0530)  cefTRIAXone  (ROCEPHIN ) 2 g in sodium chloride  0.9 % 100 mL IVPB (2 g Intravenous New Bag/Given 01/31/25 2221)  acetaminophen  (TYLENOL ) tablet 650 mg (650 mg Oral Given 01/31/25 2202)    Or  acetaminophen  (TYLENOL ) suppository 650 mg ( Rectal See Alternative 01/31/25 2202)  ondansetron  (ZOFRAN ) tablet 4 mg (has no administration in time range)    Or  ondansetron  (ZOFRAN ) injection 4 mg (has no administration in time range)  polyethylene glycol (MIRALAX  / GLYCOLAX ) packet 17 g (has no administration in time range)  antiseptic oral rinse (BIOTENE) solution 15 mL (15 mLs Mouth Rinse Given 01/31/25 1127)  phenol (CHLORASEPTIC) mouth spray 1 spray (1 spray Mouth/Throat Given 01/30/25 2056)  doxycycline  (VIBRA -TABS) tablet 100 mg (100 mg Oral Given 01/31/25 2205)  phosphorus (K PHOS  NEUTRAL) tablet 500 mg (500 mg Oral Given 01/31/25 2202)  potassium chloride  SA (KLOR-CON  M) CR tablet 40 mEq (40 mEq Oral Given 01/31/25 1623)  ALPRAZolam  (XANAX ) tablet 0.25 mg (0.25 mg Oral Given 01/31/25 2203)  morphine  CONCENTRATE 10 mg / 0.5 ml oral solution 5 mg (has no administration in time range)  senna (SENOKOT) tablet 17.2 mg (has no administration in time range)  ondansetron  (ZOFRAN ) injection 4 mg (4 mg Intravenous Given 01/29/25 2013)  furosemide  (LASIX ) injection 40 mg (40 mg Intravenous Given 01/30/25 1757)  0.9 %  sodium chloride  infusion (Manually program via Guardrails IV Fluids) ( Intravenous New Bag/Given 01/31/25 1055)  0.9 %  sodium chloride  infusion (Manually program via Guardrails IV Fluids) ( Intravenous Duplicate 01/31/25 1809)  0.9 %  sodium chloride  infusion (Manually program via Guardrails IV Fluids) ( Intravenous New  Bag/Given 01/31/25 1428)  furosemide  (LASIX ) injection 60 mg (60 mg Intravenous Given 01/31/25 1623)    Clinical Course as of 02/01/25 0916  Tue Jan 29, 2025  1414 C/f acute leukemia or other myelodysplastic disorder [HN]  1414 Calcium : 8.9 No hypocalcemia, no hyperkalemia, overall low c/f TLS, awaiting phos and uric acid. [HN]    Clinical Course User Index [HN] Franklyn Sid SAILOR, MD                                 Medical Decision Making Amount and/or Complexity of Data Reviewed Labs: ordered. Decision-making details documented in ED Course. Radiology: ordered.  Risk Decision regarding hospitalization.   This patient presents to the ED for concern of weakness, abdominal pain, nausea and vomiting, this involves an extensive number of treatment options, and is a complaint that carries with it a high risk of complications and morbidity.  The differential diagnosis includes acute appendicitis, cholecystitis, small bowel obstruction, diverticulitis, ovarian torsion or cyst, pyelonephritis, kidney stone, pancreatitis, mesenteric ischemia, acute viral syndrome, gastroenteritis, leukemia, lymphoma   Co morbidities that complicate the patient evaluation  GERD, hyperlipidemia, hypertension, hypothyroidism   Additional history obtained:  Additional history obtained from family External records from outside source obtained and reviewed including medical records   Lab Tests:  I Ordered, and personally interpreted labs.  The pertinent results include: Leukocytosis, anemia, thrombocytopenia, normal kidney function, elevated AST and ALT, mild hyponatremia, Hemoccult positive stool, negative respiratory panel, elevated LDH, normal uric acid    Consultations Obtained:  I requested consultation with the oncology, Dr. Davonna,  and discussed lab and imaging findings as well as pertinent plan - they recommend: Will see patient in consult  Problem List / ED Course / Critical interventions /  Medication management  Patient does remain stable at this point.  Did discuss patient case with oncology who will see the patient in consult and discuss treatment options as symptoms are most likely consistent with acute leukemia.  She does have a large amount of blast in her differential.  Dispo will be determined by oncology whether she can stay at our facility or need to be transferred to a tertiary care center.  Will sign patient out to Sherran Barks, PA-C pending imaging results and oncology evaluation. I ordered medication including platelets for thrombocytopenia Reevaluation of the patient after these medicines showed that the patient improved I have reviewed the patients home medicines and have made adjustments as needed   Social Determinants of Health:  None   Test / Admission - Considered:  Admission     Final diagnoses:  Thrombocytopenia    ED Discharge Orders     None          Daralene Lonni JONETTA DEVONNA 02/01/25 9055  "

## 2025-02-01 NOTE — Discharge Summary (Signed)
 " Physician Discharge Summary   Patient: Erica [name] MRN: [id number] DOB: 1930-03-05  Admit date:     01/29/2025  Discharge date: 02/01/25  Discharge Physician: Alm Ayzia Day   PCP: Gladis Mustard, [name]   Discharge home with hospice care    Hospital Course: 89 year old female with a history of atrial fibrillation on apixaban , hypertension, hyperlipidemia, gastroparesis, depression, left foot drop presenting with at least 1 week history of nausea, decreased oral intake, and generalized weakness.  The patient's daughter is at the bedside to supplement the history.  At baseline, the patient lives alone with help from her family who stops and to help and checking up on her frequently.  At baseline, the patient uses a walker and cane to assist with ambulation.  She is otherwise fairly independent with her ADLs.  However, family states that in the past 6 months there has been a functional decline.  They feel she has lost about 15 pounds in the last 6 months.  In the past week, there is a significant decline in her oral intake and strength.  There are no reports of fever, chills, chest pain, abdominal pain, diarrhea.  She has some occasional choking with eating.  She has chronic issues with dysphagia and nausea and gastroparesis.  She has not been start any new medications. In the ED, the patient had a low-grade temperature 100.3 F.  She was hemodynamically stable.  Oxygen saturation was 95% on 3 L.  WBC 157.2, hemoglobin 10.7, platelets 14.  Sodium 134, potassium 4.4, bicarbonate 20, serum creatinine 0.85.  AST 44, ALT 54, alk phosphatase 86, total bilirubin 0.5.  He LDH 343.  COVID-19 negative.  The patient was started IV fluids. CT neck negative for any acute findings. CT CAP--patchy left upper lobe airspace disease.  Small bilateral pleural effusions left greater than right, cardiomegaly, stable 1.5 cm mesenteric nodule with central calcification.  The patient was given 1 unit platelets. The  patient was given IV furosemide  for fluid overload.  She was started on ceftriaxone  doxycycline  for pneumonia.  Respiratory status stabilized and improved.  Palliative medicine was consulted.  Goals of care discussions were undertaken.  Ultimately, the patient's family and patient decided not to pursue any further diagnostic or therapeutic options regarding her presumptive acute myeloid leukemia.  Hematology was consulted.  Dr. Davonna saw the patient.  Peripheral flow cytometry was ordered.  It was consistent with acute myeloid leukemia.  She was deemed to be a poor candidate for aggressive treatment.  Ultimately, the patient and family wanted to go home with hospice care.  On 02/01/2025 the patient's vital signs remained stable and she was clinically stable for transfer home.  Assessment and Plan: Thrombocytopenia/leukemoid reaction -Concerning for acute myeloid leukemia on peripheral flow cytometry - Peripheral smear showed 96% blasts - Family leaning toward not pursuing treatment - Palliative medicine consultation - Transfuse platelets for platelets < 20,000 - Appreciate hematology consultation - Follow serial uric acid and LDH - transfuse another unit platelets today (2 total) - transfuse one unit PRBC 2/5   Acute respiratory failure with hypoxia -stable on 2L with saturation 96-98% on my eval 2/5 -due to PNA and pleural effusions -wean oxygen for saturation >92% -DC home on 2 L nasal cannula   Lobar pneumonia - Continue ceftriaxone  and doxycycline  - MRSA screen--neg - Check PCT--0.89 - DC home with cefdinir  and doxycycline  for 4 additional days   Acute HFpEF - 01/30/25 Echo--EF 60-65%, G1DD, mild AS - continues to have signs of  fluid overload - Continued IV lasix  - proBNP 5099 -d/c home with po lasix    Essential hypertension - Holding amlodipine  temporarily for soft BP - hold irbesartan    Hypothyroidism - Continue Synthroid    Heme positive stool -Per ED provider, brown  stool on rectal exam, stool occult positive  - Start pantoprazole  twice daily   Failure to thrive - B12--410 - Folate--13.3 - PT evaluation - TSH--1.920 - UA--no pyuria   Atrial fibrillation -Rate controlled - Poor candidate for apixaban  at this time secondary to thrombocytopenia - Not on any rate controlling agents outpatient          Consultants: palliative Procedures performed: none  Disposition: Home Diet recommendation:  Regular diet DISCHARGE MEDICATION: Allergies as of 02/01/2025       Reactions   Contrast Media [iodinated Contrast Media] Shortness Of Breath, Nausea Only, Swelling   Iodine Shortness Of Breath, Nausea Only, Swelling   Iohexol  Hives, Swelling    Desc: hives, swelling over 5 yrs ago-pt has never been pre-medicated--was told to not have iv contrast ever   Ioxaglate Nausea Only, Shortness Of Breath, Swelling   Bactrim  [sulfamethoxazole -trimethoprim ] Nausea And Vomiting   Metoclopramide  Other (See Comments)   Mouth tremors, insomnia, and irritability        Medication List     STOP taking these medications    amLODipine  5 MG tablet Commonly known as: NORVASC    apixaban  2.5 MG Tabs tablet Commonly known as: Eliquis    olmesartan  40 MG tablet Commonly known as: BENICAR        TAKE these medications    alendronate  70 MG tablet Commonly known as: FOSAMAX  TAKE ONE TABLET ONCE A WEEK ON AN EMPTY STOMACH WITH full GLASS of water **NEEDS TO BE SEEN BEFORE NEXT REFILL**   ALPRAZolam  0.25 MG tablet Commonly known as: XANAX  Take 1 tablet (0.25 mg total) by mouth 3 (three) times daily as needed for anxiety.   amiodarone  200 MG tablet Commonly known as: PACERONE  Take 1 tablet (200 mg total) by mouth daily.   atorvastatin  40 MG tablet Commonly known as: LIPITOR Take 1 tablet (40 mg total) by mouth daily.   Benefiber Powd Take 1 Dose by mouth daily. Take daily   cefdinir  300 MG capsule Commonly known as: OMNICEF  Take 1 capsule (300 mg  total) by mouth 2 (two) times daily.   doxycycline  100 MG tablet Commonly known as: VIBRA -TABS Take 1 tablet (100 mg total) by mouth every 12 (twelve) hours.   escitalopram  20 MG tablet Commonly known as: LEXAPRO  Take 2 tablets (40 mg total) by mouth daily.   esomeprazole  40 MG capsule Commonly known as: NEXIUM  TAKE ONE CAPSULE TWICE DAILY BEFORE MEALS   Flintstones Plus Extra C Chew Chew by mouth.   fluticasone  50 MCG/ACT nasal spray Commonly known as: FLONASE  Place 2 sprays into both nostrils daily.   furosemide  20 MG tablet Commonly known as: LASIX  Take 1 tablet (20 mg total) by mouth daily.   hydrocortisone  2.5 % rectal cream Commonly known as: ANUSOL -HC Place 1 application rectally 2 (two) times daily. What changed:  when to take this reasons to take this   levothyroxine  88 MCG tablet Commonly known as: SYNTHROID  TAKE ONE TABLET DAILY BEFORE BREAKFAST   multivitamin with minerals Tabs tablet Take 1 tablet by mouth daily.   ondansetron  4 MG disintegrating tablet Commonly known as: ZOFRAN -ODT Take 4 mg by mouth every 8 (eight) hours as needed.   polyethylene glycol 17 g packet Commonly known as: MIRALAX  / GLYCOLAX   Take 17 g by mouth daily.   Prucalopride Succinate  2 MG Tabs Commonly known as: Motegrity  Take 1 tablet (2 mg total) by mouth daily.   Restasis 0.05 % ophthalmic emulsion Generic drug: cycloSPORINE Place 1 drop into both eyes 2 (two) times daily.   senna 8.6 MG Tabs tablet Commonly known as: SENOKOT Take 2 tablets (17.2 mg total) by mouth at bedtime as needed for moderate constipation.   sucralfate  1 g tablet Commonly known as: CARAFATE  TAKE ONE TABLET FOUR TIMES DAILY BEFORE MEALS AND AT BEDTIME   Vitamin D3 125 MCG (5000 UT) Caps Take 1 capsule by mouth every other day.        Discharge Exam: Filed Weights   01/29/25 1157 01/29/25 2005 02/01/25 0500  Weight: 49 kg 51.2 kg 48.3 kg   HEENT:  [place]/AT, No thrush, no icterus CV:   RRR, no rub, no S3, no S4 Lung:  bibasilar crackles. No wheeze Abd:  soft/+BS, NT Ext:  No edema, no lymphangitis, no synovitis, no rash   Condition at discharge: stable  The results of significant diagnostics from this hospitalization (including imaging, microbiology, ancillary and laboratory) are listed below for reference.   Imaging Studies: ECHOCARDIOGRAM COMPLETE Result Date: 01/30/2025    ECHOCARDIOGRAM REPORT   Patient Name:   JEMILA CAMILLE Date of Exam: 01/30/2025 Medical Rec #:  [id number]       Height:       61.0 in Accession #:    [id number]      Weight:       112.9 lb Date of Birth:  September 10, 1930       BSA:          1.481 m Patient Age:    89 years        BP:           130/44 mmHg Patient Gender: F               HR:           67 bpm. Exam Location:  [facility] Procedure: 2D Echo, Color Doppler and Cardiac Doppler (Both Spectral and Color            Flow Doppler were utilized during procedure). Indications:    CHF-Acute Diastolic I50.31  History:        Patient has no prior history of Echocardiogram examinations.                 TIA; Arrythmias:Atrial Fibrillation.  Sonographer:    Sydnee Wilson RDCS Referring Phys: [phone] Novie Maggio IMPRESSIONS  1. Left ventricular ejection fraction, by estimation, is 60 to 65%. The left ventricle has normal function. The left ventricle has no regional wall motion abnormalities. Left ventricular diastolic parameters are consistent with Grade I diastolic dysfunction (impaired relaxation).  2. Right ventricular systolic function is normal. The right ventricular size is normal. There is normal pulmonary artery systolic pressure.  3. The mitral valve is normal in structure. Trivial mitral valve regurgitation. No evidence of mitral stenosis.  4. The aortic valve is tricuspid. Aortic valve regurgitation is not visualized. Mild aortic valve stenosis. Aortic valve area, by VTI measures 1.83 cm. Aortic valve mean gradient measures 9.0 mmHg. Aortic valve Vmax measures 2.00  m/s.  5. The inferior vena cava is normal in size with greater than 50% respiratory variability, suggesting right atrial pressure of 3 mmHg.  6. Increased flow velocities may be secondary to anemia, thyrotoxicosis, hyperdynamic or high flow state. Comparison(s): No prior  Echocardiogram. FINDINGS  Left Ventricle: Left ventricular ejection fraction, by estimation, is 60 to 65%. The left ventricle has normal function. The left ventricle has no regional wall motion abnormalities. Strain was performed and the global longitudinal strain is indeterminate. The left ventricular internal cavity size was normal in size. There is no left ventricular hypertrophy. Left ventricular diastolic parameters are consistent with Grade I diastolic dysfunction (impaired relaxation). Normal left ventricular filling pressure. Right Ventricle: The right ventricular size is normal. No increase in right ventricular wall thickness. Right ventricular systolic function is normal. There is normal pulmonary artery systolic pressure. The tricuspid regurgitant velocity is 2.37 m/s, and  with an assumed right atrial pressure of 3 mmHg, the estimated right ventricular systolic pressure is 25.5 mmHg. Left Atrium: Left atrial size was normal in size. Right Atrium: Right atrial size was normal in size. Pericardium: There is no evidence of pericardial effusion. Mitral Valve: The mitral valve is normal in structure. Trivial mitral valve regurgitation. No evidence of mitral valve stenosis. Tricuspid Valve: The tricuspid valve is normal in structure. Tricuspid valve regurgitation is trivial. No evidence of tricuspid stenosis. Aortic Valve: The aortic valve is tricuspid. Aortic valve regurgitation is not visualized. Mild aortic stenosis is present. Aortic valve mean gradient measures 9.0 mmHg. Aortic valve peak gradient measures 15.9 mmHg. Aortic valve area, by VTI measures 1.83 cm. Pulmonic Valve: The pulmonic valve was not well visualized. Pulmonic valve  regurgitation is trivial. No evidence of pulmonic stenosis. Aorta: The aortic root and ascending aorta are structurally normal, with no evidence of dilitation. Venous: The inferior vena cava is normal in size with greater than 50% respiratory variability, suggesting right atrial pressure of 3 mmHg. IAS/Shunts: No atrial level shunt detected by color flow Doppler. Additional Comments: 3D was performed not requiring image post processing on an independent workstation and was indeterminate.  LEFT VENTRICLE PLAX 2D LVIDd:         4.40 cm     Diastology LVIDs:         2.80 cm     LV e' medial:    7.62 cm/s LV PW:         0.90 cm     LV E/e' medial:  14.2 LV IVS:        0.90 cm     LV e' lateral:   8.49 cm/s LVOT diam:     2.00 cm     LV E/e' lateral: 12.7 LV SV:         91 LV SV Index:   62 LVOT Area:     3.14 cm  LV Volumes (MOD) LV vol d, MOD A2C: 67.8 ml LV vol d, MOD A4C: 66.6 ml LV vol s, MOD A2C: 18.4 ml LV vol s, MOD A4C: 27.4 ml LV SV MOD A2C:     49.4 ml LV SV MOD A4C:     66.6 ml LV SV MOD BP:      45.6 ml RIGHT VENTRICLE RV S prime:     13.10 cm/s TAPSE (M-mode): 1.8 cm LEFT ATRIUM             Index        RIGHT ATRIUM          Index LA diam:        3.20 cm 2.16 cm/m   RA Area:     9.80 cm LA Vol (A2C):   16.9 ml 11.41 ml/m  RA Volume:   19.00 ml 12.83 ml/m LA Vol (  A4C):   28.5 ml 19.24 ml/m LA Biplane Vol: 23.6 ml 15.93 ml/m  AORTIC VALVE AV Area (Vmax):    1.64 cm AV Area (Vmean):   1.58 cm AV Area (VTI):     1.83 cm AV Vmax:           199.60 cm/s AV Vmean:          141.400 cm/s AV VTI:            0.499 m AV Peak Grad:      15.9 mmHg AV Mean Grad:      9.0 mmHg LVOT Vmax:         104.32 cm/s LVOT Vmean:        71.300 cm/s LVOT VTI:          0.291 m LVOT/AV VTI ratio: 0.58  AORTA Ao Root diam: 3.20 cm Ao Asc diam:  2.70 cm MITRAL VALVE                TRICUSPID VALVE MV Area (PHT): 2.77 cm     TR Peak grad:   22.5 mmHg MV Decel Time: 274 msec     TR Vmax:        237.00 cm/s MV E velocity: 108.00  cm/s MV A velocity: 133.00 cm/s  SHUNTS MV E/A ratio:  0.81         Systemic VTI:  0.29 m                             Systemic Diam: 2.00 cm Vishnu Priya Mallipeddi Electronically signed by Diannah Late Mallipeddi Signature Date/Time: 01/30/2025/4:31:19 PM    Final    CT Soft Tissue Neck Wo Contrast Result Date: 01/29/2025 EXAM: CT NECK WITHOUT CONTRAST 01/29/2025 04:06:28 PM TECHNIQUE: CT of the neck was performed without the administration of intravenous contrast. Multiplanar reformatted images are provided for review. Automated exposure control, iterative reconstruction, and/or weight based adjustment of the mA/kV was utilized to reduce the radiation dose to as low as reasonably achievable. COMPARISON: None available. CLINICAL HISTORY: Swelling right side of neck, leukocytosis. Swelling on the right side of the neck; leukocytosis. FINDINGS: AERODIGESTIVE TRACT: The nasopharynx is symmetric. Symmetric appearance of the oropharynx and tonsils. Normal appearance of the oral cavity and floor of mouth. Normal appearance of the epiglottis. The retropharynx is unremarkable. Symmetric appearance of the supraglottic airway. The vocal folds are symmetric. No discrete mass. No edema. SALIVARY GLANDS: The parotid and submandibular glands are unremarkable. THYROID : Unremarkable. LYMPH NODES: No suspicious cervical lymphadenopathy. No lymphadenopathy appreciated. SOFT TISSUES: There is no focal inflammatory change visualized within the soft tissues in the right aspect of the neck. No asymmetric soft tissue swelling or fluid collection appreciated. There is atherosclerosis of the right carotid bifurcation likely resulting in at least mild stenosis although evaluation is limited by lack of IV contrast. No mass. BONES: Degenerative changes in the visualized spine with disc space narrowing greatest at C4-C5. Edentulous maxilla. Degenerative changes of the left temporomandibular joint. OTHER: Visualized sinuses and mastoid air  cells are well aerated. Partially visualized left pleural effusion. There is atherosclerosis noted along the aortic arch. There are patchy opacities in the left upper lobe better characterized on the same day CT chest. IMPRESSION: 1. No acute findings in the right side of the neck related to the reported swelling and leukocytosis. 2. Partially visualized left pleural effusion and patchy left upper lobe opacities, better characterized on the same  day CT chest. Electronically signed by: Donnice Mania MD 01/29/2025 04:24 PM EST RP Workstation: [id number]   CT CHEST ABDOMEN PELVIS WO CONTRAST Result Date: 01/29/2025 CLINICAL DATA:  Leukocytosis, dyspnea, weakness and nausea for 1 week EXAM: CT CHEST, ABDOMEN AND PELVIS WITHOUT CONTRAST TECHNIQUE: Multidetector CT imaging of the chest, abdomen and pelvis was performed following the standard protocol without IV contrast. Examination was performed without contrast at the request of the ordering clinician. Assessment of the vascular structures, soft tissues, and solid viscera is severely limited. RADIATION DOSE REDUCTION: This exam was performed according to the departmental dose-optimization program which includes automated exposure control, adjustment of the mA and/or kV according to patient size and/or use of iterative reconstruction technique. COMPARISON:  08/08/2024 FINDINGS: CT CHEST FINDINGS Cardiovascular: Unenhanced imaging of the heart demonstrates borderline cardiomegaly. Decreased attenuation within the cardiac chambers may signify underlying anemia. Calcifications of the mitral and aortic valves. Normal caliber of the thoracic aorta. Atherosclerosis of the aorta and coronary vasculature. Assessment of the vascular lumen cannot be performed without intravenous contrast. Mediastinum/Nodes: No enlarged mediastinal, hilar, or axillary lymph nodes. Thyroid  gland, trachea, and esophagus demonstrate no significant findings. Lungs/Pleura: There are small bilateral  pleural effusions, left greater than right. Bilateral lower lobe atelectasis, greatest within the left lower lobe. There is patchy left upper lobe airspace disease concerning for bronchopneumonia. No pneumothorax. The central airways are patent. Musculoskeletal: No acute or destructive bony abnormalities. Reconstructed images demonstrate no additional findings. CT ABDOMEN PELVIS FINDINGS Hepatobiliary: Increased liver attenuation compatible with chronic amiodarone  therapy. Prior cholecystectomy. No biliary duct dilation. Pancreas: Unremarkable unenhanced appearance. Evaluation limited by respiratory motion. Spleen: Unremarkable unenhanced appearance. Adrenals/Urinary Tract: No urinary tract calculi or obstructive uropathy within either kidney. The adrenals and bladder are unremarkable. Stomach/Bowel: No bowel obstruction or ileus. Normal appendix right lower quadrant. Distal colonic diverticulosis without diverticulitis. No bowel wall thickening or inflammatory change. Moderate stool throughout the distal colon. Vascular/Lymphatic: Aortic atherosclerosis. A central mesenteric nodule containing coarse central calcification measures 1.5 by 1.4 cm reference image 81/2, stable since prior study. This may reflect a calcified mesenteric lymph node. No other pathologic adenopathy is identified. Reproductive: Uterus and bilateral adnexa are unremarkable. Other: Trace pelvic free fluid. No free intraperitoneal gas. No abdominal wall hernia. Musculoskeletal: No acute or destructive bony abnormalities. Reconstructed images demonstrate no additional findings. IMPRESSION: 1. Patchy left upper lobe airspace disease consistent with bronchopneumonia. 2. Small bilateral pleural effusions, left greater than right. 3. Cardiomegaly. Decreased attenuation within the cardiac chambers consistent with anemia. 4. Stable 1.5 cm mesenteric nodule with central coarse calcification, likely calcified lymph node. No other pathologic adenopathy.  5. Distal colonic diverticulosis without diverticulitis. 6.  Aortic Atherosclerosis (ICD10-I70.0). Electronically Signed   By: Ozell Daring M.D.   On: 01/29/2025 16:21    Microbiology: Results for orders placed or performed during the hospital encounter of 01/29/25  Resp panel by RT-PCR (RSV, Flu A&B, Covid) Anterior Nasal Swab     Status: None   Collection Time: 01/29/25  3:35 PM   Specimen: Anterior Nasal Swab  Result Value Ref Range Status   SARS Coronavirus 2 by RT PCR NEGATIVE NEGATIVE Final    Comment: (NOTE) SARS-CoV-2 target nucleic acids are NOT DETECTED.  The SARS-CoV-2 RNA is generally detectable in upper respiratory specimens during the acute phase of infection. The lowest concentration of SARS-CoV-2 viral copies this assay can detect is 138 copies/mL. A negative result does not preclude SARS-Cov-2 infection and should not be used as the sole  basis for treatment or other patient management decisions. A negative result may occur with  improper specimen collection/handling, submission of specimen other than nasopharyngeal swab, presence of viral mutation(s) within the areas targeted by this assay, and inadequate number of viral copies(<138 copies/mL). A negative result must be combined with clinical observations, patient history, and epidemiological information. The expected result is Negative.  Fact Sheet for Patients:  bloggercourse.com  Fact Sheet for Healthcare Providers:  seriousbroker.it  This test is no t yet approved or cleared by the [place]  FDA and  has been authorized for detection and/or diagnosis of SARS-CoV-2 by FDA under an Emergency Use Authorization (EUA). This EUA will remain  in effect (meaning this test can be used) for the duration of the COVID-19 declaration under Section 564(b)(1) of the Act, 21 U.S.C.section 360bbb-3(b)(1), unless the authorization is terminated  or revoked sooner.        Influenza A by PCR NEGATIVE NEGATIVE Final   Influenza B by PCR NEGATIVE NEGATIVE Final    Comment: (NOTE) The Xpert Xpress SARS-CoV-2/FLU/RSV plus assay is intended as an aid in the diagnosis of influenza from Nasopharyngeal swab specimens and should not be used as a sole basis for treatment. Nasal washings and aspirates are unacceptable for Xpert Xpress SARS-CoV-2/FLU/RSV testing.  Fact Sheet for Patients: bloggercourse.com  Fact Sheet for Healthcare Providers: seriousbroker.it  This test is not yet approved or cleared by the [place]  FDA and has been authorized for detection and/or diagnosis of SARS-CoV-2 by FDA under an Emergency Use Authorization (EUA). This EUA will remain in effect (meaning this test can be used) for the duration of the COVID-19 declaration under Section 564(b)(1) of the Act, 21 U.S.C. section 360bbb-3(b)(1), unless the authorization is terminated or revoked.     Resp Syncytial Virus by PCR NEGATIVE NEGATIVE Final    Comment: (NOTE) Fact Sheet for Patients: bloggercourse.com  Fact Sheet for Healthcare Providers: seriousbroker.it  This test is not yet approved or cleared by the [place]  FDA and has been authorized for detection and/or diagnosis of SARS-CoV-2 by FDA under an Emergency Use Authorization (EUA). This EUA will remain in effect (meaning this test can be used) for the duration of the COVID-19 declaration under Section 564(b)(1) of the Act, 21 U.S.C. section 360bbb-3(b)(1), unless the authorization is terminated or revoked.  Performed at [facility], [address] [place], [place] [address]   MRSA Next Gen by PCR, Nasal     Status: None   Collection Time: 01/29/25  8:15 PM   Specimen: Nasal Mucosa; Nasal Swab  Result Value Ref Range Status   MRSA by PCR Next Gen NOT DETECTED NOT DETECTED Final    Comment: (NOTE) The GeneXpert  MRSA Assay (FDA approved for NASAL specimens only), is one component of a comprehensive MRSA colonization surveillance program. It is not intended to diagnose MRSA infection nor to guide or monitor treatment for MRSA infections. Test performance is not FDA approved in patients less than 85 years old. Performed at [facility], [address], [place], [place] [address]     Labs: CBC: Recent Labs  Lab 01/29/25 1205 01/30/25 0437 01/31/25 0459 02/01/25 0440  WBC 157.2* 144.8* 189.7* 223.7*  NEUTROABS 1.6*  --  3.8  --   HGB 8.3* 7.4* 6.9* 10.1*  HCT 25.8* 22.9* 21.5* 30.6*  MCV 111.7* 112.3* 112.6* 97.5  PLT 14* 35* 20* 70*   Basic Metabolic Panel: Recent Labs  Lab 01/29/25 1205 01/29/25 1349 01/29/25 1349 01/30/25 0437 01/30/25 1028  01/30/25 1724 01/31/25 0459 02/01/25 0440  NA 134*  --   --  135  --   --  133* 136  K 4.4  --   --  3.6  --   --  3.3* 3.3*  CL 101  --   --  102  --   --  102 101  CO2 20*  --   --  20*  --   --  22 23  GLUCOSE 134*  --   --  125*  --   --  117* 125*  BUN 26*  --   --  21  --   --  21 17  CREATININE 0.85  --   --  0.83  --   --  0.92 0.86  CALCIUM  8.9  --   --  8.0*  --   --  7.8* 7.9*  MG  --  2.4  --   --   --   --  2.3 2.1  PHOS  --  3.0   < > 3.4 3.4 2.5 2.2* 3.2   < > = values in this interval not displayed.   Liver Function Tests: Recent Labs  Lab 01/29/25 1205  AST 44*  ALT 54*  ALKPHOS 86  BILITOT 0.5  PROT 6.3*  ALBUMIN 3.3*   CBG: No results for input(s): GLUCAP in the last 168 hours.  Discharge time spent: greater than 30 minutes.  Signed: Alm Schneider, MD [facility] 02/01/2025 "

## 2025-02-21 ENCOUNTER — Inpatient Hospital Stay

## 2025-02-27 ENCOUNTER — Ambulatory Visit: Payer: Self-pay

## 2025-03-06 ENCOUNTER — Inpatient Hospital Stay: Admitting: Oncology

## 2025-03-06 ENCOUNTER — Inpatient Hospital Stay

## 2025-05-28 ENCOUNTER — Ambulatory Visit: Admitting: Nurse Practitioner
